# Patient Record
Sex: Male | Born: 1945
Health system: Southern US, Community
[De-identification: ages and names within clinical notes are randomized; demographics above are authoritative.]

## PROBLEM LIST (undated history)

## (undated) DIAGNOSIS — E785 Hyperlipidemia, unspecified: Secondary | ICD-10-CM

## (undated) DIAGNOSIS — I2510 Atherosclerotic heart disease of native coronary artery without angina pectoris: Secondary | ICD-10-CM

## (undated) DIAGNOSIS — K649 Unspecified hemorrhoids: Secondary | ICD-10-CM

## (undated) DIAGNOSIS — I209 Angina pectoris, unspecified: Secondary | ICD-10-CM

## (undated) DIAGNOSIS — F419 Anxiety disorder, unspecified: Secondary | ICD-10-CM

## (undated) DIAGNOSIS — Z9289 Personal history of other medical treatment: Secondary | ICD-10-CM

## (undated) DIAGNOSIS — R112 Nausea with vomiting, unspecified: Secondary | ICD-10-CM

## (undated) DIAGNOSIS — R918 Other nonspecific abnormal finding of lung field: Secondary | ICD-10-CM

## (undated) DIAGNOSIS — Z800 Family history of malignant neoplasm of digestive organs: Secondary | ICD-10-CM

## (undated) DIAGNOSIS — K219 Gastro-esophageal reflux disease without esophagitis: Secondary | ICD-10-CM

## (undated) DIAGNOSIS — I10 Essential (primary) hypertension: Secondary | ICD-10-CM

## (undated) DIAGNOSIS — I351 Nonrheumatic aortic (valve) insufficiency: Secondary | ICD-10-CM

## (undated) DIAGNOSIS — C3490 Malignant neoplasm of unspecified part of unspecified bronchus or lung: Secondary | ICD-10-CM

## (undated) DIAGNOSIS — C73 Malignant neoplasm of thyroid gland: Secondary | ICD-10-CM

## (undated) DIAGNOSIS — Z8489 Family history of other specified conditions: Secondary | ICD-10-CM

## (undated) DIAGNOSIS — M1990 Unspecified osteoarthritis, unspecified site: Secondary | ICD-10-CM

## (undated) DIAGNOSIS — R5383 Other fatigue: Secondary | ICD-10-CM

## (undated) DIAGNOSIS — K635 Polyp of colon: Secondary | ICD-10-CM

## (undated) DIAGNOSIS — Z9889 Other specified postprocedural states: Secondary | ICD-10-CM

## (undated) DIAGNOSIS — R0600 Dyspnea, unspecified: Secondary | ICD-10-CM

## (undated) DIAGNOSIS — R7303 Prediabetes: Secondary | ICD-10-CM

## (undated) HISTORY — DX: Atherosclerotic heart disease of native coronary artery without angina pectoris: I25.10

## (undated) HISTORY — DX: Dyspnea, unspecified: R06.00

## (undated) HISTORY — PX: PILONIDAL CYST EXCISION: SHX744

## (undated) HISTORY — DX: Polyp of colon: K63.5

## (undated) HISTORY — DX: Personal history of other medical treatment: Z92.89

## (undated) HISTORY — DX: Unspecified hemorrhoids: K64.9

## (undated) HISTORY — DX: Other fatigue: R53.83

## (undated) HISTORY — DX: Nonrheumatic aortic (valve) insufficiency: I35.1

## (undated) HISTORY — DX: Family history of malignant neoplasm of digestive organs: Z80.0

## (undated) HISTORY — PX: CAROTID ENDARTERECTOMY: SUR193

## (undated) HISTORY — DX: Malignant neoplasm of thyroid gland: C73

## (undated) HISTORY — PX: REFRACTIVE SURGERY: SHX103

## (undated) HISTORY — PX: ESOPHAGOGASTRODUODENOSCOPY: SHX1529

## (undated) HISTORY — DX: Essential (primary) hypertension: I10

## (undated) HISTORY — DX: Malignant neoplasm of unspecified part of unspecified bronchus or lung: C34.90

## (undated) HISTORY — DX: Gastro-esophageal reflux disease without esophagitis: K21.9

## (undated) HISTORY — PX: BACK SURGERY: SHX140

## (undated) HISTORY — PX: OTHER SURGICAL HISTORY: SHX169

## (undated) HISTORY — PX: CARDIAC CATHETERIZATION: SHX172

---

## 2003-11-24 ENCOUNTER — Encounter: Admission: RE | Admit: 2003-11-24 | Discharge: 2003-11-24 | Payer: Self-pay | Admitting: Family Medicine

## 2004-05-23 ENCOUNTER — Ambulatory Visit: Payer: Self-pay | Admitting: Gastroenterology

## 2004-07-25 ENCOUNTER — Ambulatory Visit: Payer: Self-pay | Admitting: Gastroenterology

## 2006-01-08 HISTORY — PX: COLONOSCOPY WITH ESOPHAGOGASTRODUODENOSCOPY (EGD): SHX5779

## 2010-07-07 ENCOUNTER — Other Ambulatory Visit: Payer: Self-pay | Admitting: Dermatology

## 2010-12-14 ENCOUNTER — Other Ambulatory Visit: Payer: Self-pay | Admitting: Dermatology

## 2011-01-25 ENCOUNTER — Other Ambulatory Visit: Payer: Self-pay | Admitting: Dermatology

## 2011-06-14 ENCOUNTER — Other Ambulatory Visit: Payer: Self-pay | Admitting: Dermatology

## 2011-08-29 ENCOUNTER — Other Ambulatory Visit: Payer: Self-pay | Admitting: Dermatology

## 2011-12-13 ENCOUNTER — Other Ambulatory Visit: Payer: Self-pay | Admitting: Dermatology

## 2012-07-10 ENCOUNTER — Other Ambulatory Visit: Payer: Self-pay | Admitting: Dermatology

## 2014-01-28 ENCOUNTER — Other Ambulatory Visit: Payer: Self-pay | Admitting: Dermatology

## 2014-12-08 ENCOUNTER — Other Ambulatory Visit: Payer: Self-pay | Admitting: Gastroenterology

## 2016-01-20 DIAGNOSIS — R232 Flushing: Secondary | ICD-10-CM | POA: Diagnosis not present

## 2016-01-20 DIAGNOSIS — M79661 Pain in right lower leg: Secondary | ICD-10-CM | POA: Diagnosis not present

## 2016-01-20 DIAGNOSIS — M79662 Pain in left lower leg: Secondary | ICD-10-CM | POA: Diagnosis not present

## 2016-01-20 DIAGNOSIS — I10 Essential (primary) hypertension: Secondary | ICD-10-CM | POA: Diagnosis not present

## 2016-01-30 DIAGNOSIS — M2041 Other hammer toe(s) (acquired), right foot: Secondary | ICD-10-CM | POA: Diagnosis not present

## 2016-01-30 DIAGNOSIS — M19071 Primary osteoarthritis, right ankle and foot: Secondary | ICD-10-CM | POA: Diagnosis not present

## 2016-02-03 ENCOUNTER — Encounter: Payer: Self-pay | Admitting: *Deleted

## 2016-02-06 ENCOUNTER — Encounter (INDEPENDENT_AMBULATORY_CARE_PROVIDER_SITE_OTHER): Payer: Self-pay

## 2016-02-06 ENCOUNTER — Ambulatory Visit (INDEPENDENT_AMBULATORY_CARE_PROVIDER_SITE_OTHER): Payer: Medicare Other | Admitting: Cardiology

## 2016-02-06 ENCOUNTER — Telehealth (HOSPITAL_COMMUNITY): Payer: Self-pay | Admitting: *Deleted

## 2016-02-06 ENCOUNTER — Encounter: Payer: Self-pay | Admitting: Cardiology

## 2016-02-06 VITALS — BP 124/72 | HR 70 | Ht 71.0 in | Wt 188.2 lb

## 2016-02-06 DIAGNOSIS — R0602 Shortness of breath: Secondary | ICD-10-CM

## 2016-02-06 DIAGNOSIS — Z01810 Encounter for preprocedural cardiovascular examination: Secondary | ICD-10-CM

## 2016-02-06 DIAGNOSIS — I10 Essential (primary) hypertension: Secondary | ICD-10-CM

## 2016-02-06 NOTE — Telephone Encounter (Signed)
Patient given detailed instructions per Myocardial Perfusion Study Information Sheet for the test on 02/08/16 at 0945. Patient notified to arrive 15 minutes early and that it is imperative to arrive on time for appointment to keep from having the test rescheduled.  If you need to cancel or reschedule your appointment, please call the office within 24 hours of your appointment. Failure to do so may result in a cancellation of your appointment, and a $50 no show fee. Patient verbalized understanding.Jaquelin Meaney, Ranae [place]ms

## 2016-02-06 NOTE — Patient Instructions (Signed)
Medication Instructions:  The current medical regimen is effective;  continue present plan and medications.  Testing/Procedures: Your physician has requested that you have an echocardiogram. Echocardiography is a painless test that uses sound waves to create images of your heart. It provides your doctor with information about the size and shape of your heart and how well your heart's chambers and valves are working. This procedure takes approximately one hour. There are no restrictions for this procedure.  Your physician has requested that you have a myoview.  Please follow instruction sheet, as given.  Follow-Up: Follow up as needed after the above testing  Thank you for choosing [facility]!!

## 2016-02-06 NOTE — Progress Notes (Signed)
Cardiology Office Note    Date:  02/06/2016   ID:  Miguel Wolfe, DOB 25-Oct-1945, MRN [id number]  PCP:  Gennette Pac, MD  Cardiologist:   Candee Furbish, MD     History of Present Illness:  Miguel Wolfe is a 71 y.o. male here for evaluation of dyspnea on exertion at the request of Dr. [name]. Been experiencing several months of fatigue, shortness of breath, energy decrease especially with yard work. Legs hurt bilaterally as well, constant, moderate, calf. One hour of yard work challenging. Interestingly however he is able to walk normally throughout the neighborhood without any symptoms. Flushing. He is not taking any niacin supplementation. ED as well. Has a toe surgery planned soon, next Tuesday, Dr. Doran Durand.  His father had coronary artery disease and he also carries the diagnosis of hypertension as well as hyperlipidemia. He is not a smoker. Weight has been under control.  He was also complaining of some myalgias and pravastatin was stopped, did not seem to help. Restarted and may have felt worse. Now off. CK was checked and normal. He did smoke but quit cigarettes in 1982. His father at age 74 years old had an myocardial infarction and subsequently has had bypass and stenting still alive. Mother valve issue.   He has had elevated glucose in the past and has been treated with Actos formally.  Hemoglobin 16.3 platelets 163, glucose 107, creatinine 0.81, total cholesterol 200, triglycerides 207, LDL 116, HDL 42, TSH 1.5  EKG personally viewed from 12/29/15 shows sinus rhythm heart rate 63 with no other abnormalities.  Denies syncope, bleeding, orthopnea, PND. Right toe pain. He will need a pin placed. May be able to be done under local.  He used to work in the past for [employer] in [place].   Past Medical History:  Diagnosis Date  . Colon polyps   . Dyslipidemia   . Dyspnea   . Fatigue   . GERD (gastroesophageal reflux disease)   .  Hemorrhoids   . Hypertension   . Leg pain   . Myalgia     Past Surgical History:  Procedure Laterality Date  . COLONOSCOPY WITH ESOPHAGOGASTRODUODENOSCOPY (EGD)  2008  . PILONIDAL CYCT    . REFRACTIVE SURGERY      Current Medications: Outpatient Medications Prior to Visit  Medication Sig Dispense Refill  . AMLODIPINE BESYLATE PO Take 10 mg by mouth daily.    Marland Kitchen aspirin EC 81 MG tablet Take 81 mg by mouth daily.    Marland Kitchen omega-3 acid ethyl esters (LOVAZA) 1 g capsule Take 2 g by mouth 2 (two) times daily.    . pravastatin (PRAVACHOL) 40 MG tablet Take 40 mg by mouth daily.    . ranitidine (ZANTAC) 75 MG tablet Take 75 mg by mouth 2 (two) times daily.    Marland Kitchen telmisartan (MICARDIS) 80 MG tablet Take 80 mg by mouth daily.    Marland Kitchen zolpidem (AMBIEN) 10 MG tablet Take 10 mg by mouth at bedtime as needed for sleep.    . propranolol (INDERAL) 10 MG tablet Take 10 mg by mouth 3 (three) times daily.     No facility-administered medications prior to visit.      Allergies:   Gemfibrozil; Niaspan [niacin er]; and Simvastatin   Social History   Social History  . Marital status: Married    Spouse name: N/A  . Number of children: N/A  . Years of education: N/A   Social History Main Topics  .  Smoking status: Former Smoker    Types: Cigarettes    Quit date: 02/01/1967  . Smokeless tobacco: Never Used  . Alcohol use Yes  . Drug use: No  . Sexual activity: Not Asked   Other Topics Concern  . None   Social History Narrative  . None     Family History:  The patient's family history includes Colon cancer in his paternal uncle; Crohn's disease in his sister; Healthy in his sister; Heart attack (age of onset: 73) in his father.   ROS:   Please see the history of present illness.    ROS All other systems reviewed and are negative.   PHYSICAL EXAM:   VS:  BP 124/72 (BP Location: Left Arm)   Pulse 70   Ht '5\' 11"'$  (1.803 m)   Wt 188 lb 3.2 oz (85.4 kg)   BMI 26.25 kg/m    GEN: Well  nourished, well developed, in no acute distress  HEENT: normal  Neck: no JVD, carotid bruits, or masses Cardiac: RRR; no murmurs, rubs, or gallops,no edema  Respiratory:  clear to auscultation bilaterally, normal work of breathing GI: soft, nontender, nondistended, + BS MS: no deformity or atrophy , 2+ dorsalis pedis pulses bilaterally, bounding Skin: warm and dry, no rash Neuro:  Alert and Oriented x 3, Strength and sensation are intact Psych: euthymic mood, full affect  Wt Readings from Last 3 Encounters:  02/06/16 188 lb 3.2 oz (85.4 kg)      Studies/Labs Reviewed:   EKG:  Prior EKG from December reviewed as above, normal sinus rhythm with no other abnormalities. EKG ordered today on 02/06/16 shows sinus rhythm 70 with possible left ventricular hypertrophy. No ST segment changes.  Recent Labs: No results found for requested labs within last 8760 hours.   Lipid Panel No results found for: CHOL, TRIG, HDL, CHOLHDL, VLDL, LDLCALC, LDLDIRECT  Additional studies/ records that were reviewed today include:  Former office notes, lab work, EKG reviewed    ASSESSMENT:    1. Shortness of breath   2. Essential hypertension   3. Encounter for pre-operative cardiovascular clearance      PLAN:  In order of problems listed above:  Dyspnea on exertion  - With his father's family history of myocardial infarction and coronary artery disease, his hyperlipidemia, hypertension, remote former smoker and previous prediabetes, I think it would be reasonable to proceed with nuclear stress test to ensure that his dyspnea on exertion is not an anginal equivalent. Occasional periodic flushing, not associated with any new supplements. He has not had any previous history of being hyper vagal, no prior syncopal episodes.  - I will also check an echocardiogram to ensure proper structure and function of his heart.  Essential hypertension  - Medications reviewed. Well-controlled per Dr.  Rex Kras.  Hyperlipidemia  - Has had some challenges With possible myalgias in the past with pravastatin. This was stopped previously by [name]. He restarted it and thinks that he may felt a little bit worse. He is now going to continue with his statin holiday. CPK was normal.  Leg pain  - Thankfully, his distal pedal pulses are bounding, excellent. No evidence of peripheral vascular disease.  Preoperative cardiac risk stratification  - Given his dyspnea on exertion while doing yardwork for instance, I think would be wise for him to proceed with nuclear stress test prior to surgery. This will be done hopefully before his surgical procedure on next Tuesday. If for some reason stress testing needs to  be delayed, he may wish to delay his surgery albeit fairly low risk surgery.    Medication Adjustments/Labs and Tests Ordered: Current medicines are reviewed at length with the patient today.  Concerns regarding medicines are outlined above.  Medication changes, Labs and Tests ordered today are listed in the Patient Instructions below. Patient Instructions  Medication Instructions:  The current medical regimen is effective;  continue present plan and medications.  Testing/Procedures: Your physician has requested that you have an echocardiogram. Echocardiography is a painless test that uses sound waves to create images of your heart. It provides your doctor with information about the size and shape of your heart and how well your heart's chambers and valves are working. This procedure takes approximately one hour. There are no restrictions for this procedure.  Your physician has requested that you have a myoview.  Please follow instruction sheet, as given.  Follow-Up: Follow up as needed after the above testing  Thank you for choosing [facility]!!        Signed, Candee Furbish, MD  02/06/2016 11:02 AM    [facility] [address], [place], [place]   [address] Phone: [phone]; Fax: [phone]

## 2016-02-08 ENCOUNTER — Ambulatory Visit (HOSPITAL_COMMUNITY): Payer: Medicare Other | Attending: Cardiovascular Disease

## 2016-02-08 ENCOUNTER — Ambulatory Visit (HOSPITAL_BASED_OUTPATIENT_CLINIC_OR_DEPARTMENT_OTHER): Payer: Medicare Other

## 2016-02-08 ENCOUNTER — Telehealth: Payer: Self-pay | Admitting: Cardiology

## 2016-02-08 ENCOUNTER — Other Ambulatory Visit: Payer: Self-pay

## 2016-02-08 DIAGNOSIS — R0609 Other forms of dyspnea: Secondary | ICD-10-CM | POA: Diagnosis not present

## 2016-02-08 DIAGNOSIS — I10 Essential (primary) hypertension: Secondary | ICD-10-CM

## 2016-02-08 DIAGNOSIS — R9439 Abnormal result of other cardiovascular function study: Secondary | ICD-10-CM | POA: Insufficient documentation

## 2016-02-08 DIAGNOSIS — I429 Cardiomyopathy, unspecified: Secondary | ICD-10-CM | POA: Diagnosis present

## 2016-02-08 DIAGNOSIS — Z87891 Personal history of nicotine dependence: Secondary | ICD-10-CM | POA: Insufficient documentation

## 2016-02-08 DIAGNOSIS — E785 Hyperlipidemia, unspecified: Secondary | ICD-10-CM | POA: Diagnosis not present

## 2016-02-08 DIAGNOSIS — Z8249 Family history of ischemic heart disease and other diseases of the circulatory system: Secondary | ICD-10-CM | POA: Insufficient documentation

## 2016-02-08 DIAGNOSIS — I119 Hypertensive heart disease without heart failure: Secondary | ICD-10-CM | POA: Diagnosis not present

## 2016-02-08 DIAGNOSIS — Z01810 Encounter for preprocedural cardiovascular examination: Secondary | ICD-10-CM

## 2016-02-08 DIAGNOSIS — R0602 Shortness of breath: Secondary | ICD-10-CM | POA: Diagnosis not present

## 2016-02-08 DIAGNOSIS — R5383 Other fatigue: Secondary | ICD-10-CM | POA: Diagnosis not present

## 2016-02-08 DIAGNOSIS — R0600 Dyspnea, unspecified: Secondary | ICD-10-CM | POA: Diagnosis present

## 2016-02-08 LAB — MYOCARDIAL PERFUSION IMAGING
CHL CUP MPHR: 152 {beats}/min
CHL CUP NUCLEAR SDS: 3
CHL RATE OF PERCEIVED EXERTION: 18
CSEPEDS: 0 s
CSEPHR: 100 %
CSEPPHR: 153 {beats}/min
Estimated workload: 8.5 METS
Exercise duration (min): 7 min
LV sys vol: 60 mL
LVDIAVOL: 123 mL (ref 62–150)
RATE: 0.25
Rest HR: 105 {beats}/min
SRS: 5
SSS: 8
TID: 1.3

## 2016-02-08 LAB — ECHOCARDIOGRAM COMPLETE
Height: 71 in
Weight: 3008 oz

## 2016-02-08 MED ORDER — TECHNETIUM TC 99M TETROFOSMIN IV KIT
10.5000 | PACK | Freq: Once | INTRAVENOUS | Status: AC | PRN
  Administered 2016-02-08: 10.5 via INTRAVENOUS
  Filled 2016-02-08: qty 11

## 2016-02-08 MED ORDER — TECHNETIUM TC 99M TETROFOSMIN IV KIT
32.8000 | PACK | Freq: Once | INTRAVENOUS | Status: AC | PRN
Start: 2016-02-08 — End: 2016-02-08
  Administered 2016-02-08: 32.8 via INTRAVENOUS
  Filled 2016-02-08: qty 33

## 2016-02-08 NOTE — Telephone Encounter (Signed)
New Message   Pt received results on my chart and is having trouble understanding. Requesting call back from nurse to help clarify.

## 2016-02-08 NOTE — Telephone Encounter (Signed)
Reviewed results of both echo and nuc study with patient.  He is aware of results and that he needs to come into office to discuss possible need for cardiac cath.  He would like to know from Dr Marlou Porch 2/1 if he can have surgery as scheduled on Tuesday next week.  He is aware someone will call him back tomorrow.

## 2016-02-09 NOTE — Telephone Encounter (Signed)
My suggestion is to hold on surgery. Candee Furbish, MD

## 2016-02-09 NOTE — Telephone Encounter (Signed)
Pt is aware to hold on surgery. Pt is aware to keep appointment with Dr. Marlou Porch on 02/15/16 at 11:15 AM. Pt verbalized understanding.

## 2016-02-15 ENCOUNTER — Ambulatory Visit (INDEPENDENT_AMBULATORY_CARE_PROVIDER_SITE_OTHER): Payer: Medicare Other | Admitting: Cardiology

## 2016-02-15 ENCOUNTER — Encounter: Payer: Self-pay | Admitting: Cardiology

## 2016-02-15 ENCOUNTER — Encounter: Payer: Self-pay | Admitting: *Deleted

## 2016-02-15 VITALS — BP 122/70 | HR 68 | Ht 71.0 in | Wt 188.0 lb

## 2016-02-15 DIAGNOSIS — R0602 Shortness of breath: Secondary | ICD-10-CM | POA: Diagnosis not present

## 2016-02-15 DIAGNOSIS — I10 Essential (primary) hypertension: Secondary | ICD-10-CM | POA: Diagnosis not present

## 2016-02-15 DIAGNOSIS — R9439 Abnormal result of other cardiovascular function study: Secondary | ICD-10-CM

## 2016-02-15 NOTE — Patient Instructions (Signed)
Medication Instructions:  Your physician recommends that you continue on your current medications as directed. Please refer to the Current Medication list given to you today.   Labwork: Lab work to be done today-BMP, CBC, PT  Testing/Procedures: Your physician has requested that you have a cardiac catheterization. Cardiac catheterization is used to diagnose and/or treat various heart conditions. Doctors may recommend this procedure for a number of different reasons. The most common reason is to evaluate chest pain. Chest pain can be a symptom of coronary artery disease (CAD), and cardiac catheterization can show whether plaque is narrowing or blocking your heart's arteries. This procedure is also used to evaluate the valves, as well as measure the blood flow and oxygen levels in different parts of your heart. For further information please visit [email or web address]. Please follow instruction sheet, as given. Scheduled for Feb.12, 2018  Follow-Up: To be arranged based on results of procedure.   Any Other Special Instructions Will Be Listed Below (If Applicable).     If you need a refill on your cardiac medications before your next appointment, please call your pharmacy.

## 2016-02-15 NOTE — Progress Notes (Signed)
Cardiology Office Note    Date:  02/15/2016   ID:  Miguel Wolfe, DOB January 02, 1946, MRN [id number]  PCP:  Gennette Pac, MD  Cardiologist:   Candee Furbish, MD     History of Present Illness:  Miguel Wolfe is a 71 y.o. male here for follow-up of abnormal nuclear stress test showing inferolateral MI with peri-infarct ischemia, EF 51% on nuclear, 55% on echocardiogram. This was done secondary to dyspnea on exertion and performing yard work for instance. Been experiencing several months of fatigue, shortness of breath, energy decrease especially with yard work. Legs hurt bilaterally as well, constant, moderate, calf. One hour of yard work challenging. Interestingly however he is able to walk normally throughout the neighborhood without any symptoms. Flushing. He is not taking any niacin supplementation. ED as well. Has a toe surgery planned soon,  Dr. Doran Durand.  His father had coronary artery disease and he also carries the diagnosis of hypertension as well as hyperlipidemia. He is not a smoker. Weight has been under control.  He was also complaining of some myalgias and pravastatin was stopped, did not seem to help. Restarted and may have felt worse. Now off. Now back on and feels no different. Excellent bounding pulses distally. CK was checked and normal. He did smoke but quit cigarettes in 1982. His father at age 72 years old had an myocardial infarction and subsequently has had bypass and stenting still alive. Mother valve issue.   He has had elevated glucose in the past and has been treated with Actos formally.  Hemoglobin 16.3 platelets 163, glucose 107, creatinine 0.81, total cholesterol 200, triglycerides 207, LDL 116, HDL 42, TSH 1.5  EKG personally viewed from 12/29/15 shows sinus rhythm heart rate 63 with no other abnormalities.  Denies syncope, bleeding, orthopnea, PND. Right toe pain. He will need a pin placed. May be able to be done under local.  He used to work in the past for  [employer] in [place].   Past Medical History:  Diagnosis Date  . Colon polyps   . Dyslipidemia   . Dyspnea   . Fatigue   . GERD (gastroesophageal reflux disease)   . Hemorrhoids   . Hypertension   . Leg pain   . Myalgia     Past Surgical History:  Procedure Laterality Date  . COLONOSCOPY WITH ESOPHAGOGASTRODUODENOSCOPY (EGD)  2008  . PILONIDAL CYCT    . REFRACTIVE SURGERY      Current Medications: Outpatient Medications Prior to Visit  Medication Sig Dispense Refill  . AMLODIPINE BESYLATE PO Take 10 mg by mouth daily.    Marland Kitchen aspirin EC 81 MG tablet Take 81 mg by mouth daily.    Marland Kitchen omega-3 acid ethyl esters (LOVAZA) 1 g capsule Take 2 g by mouth 2 (two) times daily.    . pravastatin (PRAVACHOL) 40 MG tablet Take 40 mg by mouth daily.    . propranolol (INDERAL) 20 MG tablet Take 20 mg by mouth daily.  2  . ranitidine (ZANTAC) 75 MG tablet Take 75 mg by mouth 2 (two) times daily.    Marland Kitchen telmisartan (MICARDIS) 80 MG tablet Take 80 mg by mouth daily.    Marland Kitchen zolpidem (AMBIEN) 10 MG tablet Take 10 mg by mouth at bedtime as needed for sleep.     No facility-administered medications prior to visit.      Allergies:   Gemfibrozil; Niaspan [niacin er]; and Simvastatin   Social History   Social History  .  Marital status: Married    Spouse name: N/A  . Number of children: N/A  . Years of education: N/A   Social History Main Topics  . Smoking status: Former Smoker    Types: Cigarettes    Quit date: 02/01/1967  . Smokeless tobacco: Never Used  . Alcohol use Yes  . Drug use: No  . Sexual activity: Not Asked   Other Topics Concern  . None   Social History Narrative  . None     Family History:  The patient's family history includes Colon cancer in his paternal uncle; Crohn's disease in his sister; Healthy in his sister; Heart attack (age of onset: 17) in his father.   ROS:   Please see the history of present illness.    ROS All other systems  reviewed and are negative.   PHYSICAL EXAM:   VS:  BP 122/70   Pulse 68   Ht '5\' 11"'$  (1.803 m)   Wt 188 lb (85.3 kg)   BMI 26.22 kg/m    GEN: Well nourished, well developed, in no acute distress  HEENT: normal  Neck: no JVD, carotid bruits, or masses Cardiac: RRR; no murmurs, rubs, or gallops,no edema  Respiratory:  clear to auscultation bilaterally, normal work of breathing GI: soft, nontender, nondistended, + BS MS: no deformity or atrophy , 2+ dorsalis pedis pulses bilaterally, bounding Skin: warm and dry, no rash Neuro:  Alert and Oriented x 3, Strength and sensation are intact Psych: euthymic mood, full affect  Wt Readings from Last 3 Encounters:  02/15/16 188 lb (85.3 kg)  02/08/16 188 lb (85.3 kg)  02/06/16 188 lb 3.2 oz (85.4 kg)      Studies/Labs Reviewed:   EKG:  Prior EKG from December reviewed as above, normal sinus rhythm with no other abnormalities. EKG ordered today on 02/06/16 shows sinus rhythm 70 with possible left ventricular hypertrophy. No ST segment changes.  Recent Labs: No results found for requested labs within last 8760 hours.   Lipid Panel No results found for: CHOL, TRIG, HDL, CHOLHDL, VLDL, LDLCALC, LDLDIRECT  Additional studies/ records that were reviewed today include:  Former office notes, lab work, EKG reviewed  [place] 02/08/16  The patient walked for 7:00 of a standard Bruce protocol GXT . Peak HR of 153 which is 100% predicted maximal HR .  There were no ST or T wave changes to suggest ischemia. BP response to exercise was normal .  Defect 1: There is a medium defect of moderate severity present in the basal inferolateral, mid inferolateral and apical lateral location. This is consistent with a prior inferolateral MI with peri-infarct ischemia.  The left ventricular ejection fraction is mildly decreased (45-54%). Nuclear stress EF: 51%.  This is an intermediate risk study.   ECHO 02/08/16  - Left ventricle: The cavity size  was normal. Wall thickness was   increased in a pattern of mild LVH. Systolic function was normal.   The estimated ejection fraction was in the range of 55% to 60%.   Wall motion was normal; there were no regional wall motion   abnormalities. Doppler parameters are consistent with abnormal   left ventricular relaxation (grade 1 diastolic dysfunction). - Aortic valve: There was trivial regurgitation. - Left atrium: The atrium was mildly dilated.  ASSESSMENT:    1. Abnormal nuclear stress test   2. Shortness of breath   3. Essential hypertension      PLAN:  In order of problems listed above:  Dyspnea on  exertion  - Nuclear stress test was abnormal showing intermediate risk and possible inferolateral old MI possible ischemia as well  - I would like for him to proceed with cardiac catheterization, diagnostic given his symptoms which may be anginal.  - Note his father has history of MI/CAD.  - Risks of cardiac catheterization reviewed including stroke, heart attack, death, renal impairment, bleeding.  - Echo EF was reassuring with no wall motion abnormalities.  -  Occasional periodic flushing, not associated with any new supplements. He has not had any previous history of being hyper vagal, no prior syncopal episodes.  Essential hypertension  - Medications reviewed. Well-controlled per [name].  Hyperlipidemia  - Has had some challenges With possible myalgias in the past with pravastatin. This was stopped previously by [name]. He restarted it and thinksThat he feels no different. He is now going to continue with his statin. CPK was normal.  Leg pain  - Thankfully, his distal pedal pulses are bounding, excellent. No evidence of peripheral vascular disease. Unsure of cause of pain.  Preoperative cardiac risk stratification  - Given his dyspnea on exertion while doing yardwork for instance, and intermediate risk nuclear stress test I think would be wise for him to proceed with  heart catheterization prior to surgery. He does have an upcoming toe surgery, Dr. Doran Durand. This should be a relatively low risk procedure however I do make sure that he does not have any significant underlying CAD that needs to be addressed. We discussed the risks of heart catheterization 1:1000 stroke, heart attack, death, renal impairment, bleeding. His wife was present for discussion. Willing to proceed.   Medication Adjustments/Labs and Tests Ordered: Current medicines are reviewed at length with the patient today.  Concerns regarding medicines are outlined above.  Medication changes, Labs and Tests ordered today are listed in the Patient Instructions below. There are no Patient Instructions on file for this visit.   Signed, Candee Furbish, MD  02/15/2016 11:54 AM    [facility] [address], [place], [place]  [address] Phone: [phone]; Fax: [phone]

## 2016-02-16 DIAGNOSIS — I10 Essential (primary) hypertension: Secondary | ICD-10-CM | POA: Insufficient documentation

## 2016-02-16 DIAGNOSIS — R9439 Abnormal result of other cardiovascular function study: Secondary | ICD-10-CM | POA: Insufficient documentation

## 2016-02-16 LAB — CBC WITH DIFFERENTIAL/PLATELET
BASOS: 0 %
Basophils Absolute: 0 10*3/uL (ref 0.0–0.2)
EOS (ABSOLUTE): 0.3 10*3/uL (ref 0.0–0.4)
EOS: 5 %
HEMATOCRIT: 48 % (ref 37.5–51.0)
HEMOGLOBIN: 16.3 g/dL (ref 13.0–17.7)
Immature Grans (Abs): 0 10*3/uL (ref 0.0–0.1)
Immature Granulocytes: 0 %
LYMPHS ABS: 2 10*3/uL (ref 0.7–3.1)
Lymphs: 36 %
MCH: 30.4 pg (ref 26.6–33.0)
MCHC: 34 g/dL (ref 31.5–35.7)
MCV: 90 fL (ref 79–97)
MONOCYTES: 9 %
MONOS ABS: 0.5 10*3/uL (ref 0.1–0.9)
NEUTROS ABS: 2.8 10*3/uL (ref 1.4–7.0)
Neutrophils: 50 %
Platelets: 187 10*3/uL (ref 150–379)
RBC: 5.36 x10E6/uL (ref 4.14–5.80)
RDW: 13.7 % (ref 12.3–15.4)
WBC: 5.5 10*3/uL (ref 3.4–10.8)

## 2016-02-16 LAB — BASIC METABOLIC PANEL
BUN / CREAT RATIO: 19 (ref 10–24)
BUN: 15 mg/dL (ref 8–27)
CHLORIDE: 98 mmol/L (ref 96–106)
CO2: 26 mmol/L (ref 18–29)
Calcium: 9.7 mg/dL (ref 8.6–10.2)
Creatinine, Ser: 0.81 mg/dL (ref 0.76–1.27)
GFR calc Af Amer: 106 mL/min/{1.73_m2} (ref >59)
GFR calc non Af Amer: 91 mL/min/{1.73_m2} (ref >59)
GLUCOSE: 108 mg/dL — AB (ref 65–99)
Potassium: 4 mmol/L (ref 3.5–5.2)
SODIUM: 143 mmol/L (ref 134–144)

## 2016-02-16 LAB — PROTIME-INR
INR: 1 (ref 0.8–1.2)
PROTHROMBIN TIME: 10.9 s (ref 9.1–12.0)

## 2016-02-20 ENCOUNTER — Encounter (HOSPITAL_COMMUNITY): Payer: Self-pay | Admitting: Cardiovascular Disease

## 2016-02-20 ENCOUNTER — Ambulatory Visit (HOSPITAL_COMMUNITY)
Admission: RE | Admit: 2016-02-20 | Discharge: 2016-02-20 | Disposition: A | Discharge status: Home / Self Care | Payer: Medicare Other | Source: Ambulatory Visit | Attending: Cardiovascular Disease | Admitting: Cardiovascular Disease

## 2016-02-20 ENCOUNTER — Other Ambulatory Visit: Payer: Self-pay

## 2016-02-20 ENCOUNTER — Encounter (HOSPITAL_COMMUNITY)
Admission: RE | Disposition: A | Discharge status: Home / Self Care | Payer: Self-pay | Source: Ambulatory Visit | Attending: Cardiovascular Disease

## 2016-02-20 DIAGNOSIS — E785 Hyperlipidemia, unspecified: Secondary | ICD-10-CM | POA: Diagnosis not present

## 2016-02-20 DIAGNOSIS — M791 Myalgia: Secondary | ICD-10-CM | POA: Diagnosis not present

## 2016-02-20 DIAGNOSIS — M79606 Pain in leg, unspecified: Secondary | ICD-10-CM | POA: Diagnosis not present

## 2016-02-20 DIAGNOSIS — I252 Old myocardial infarction: Secondary | ICD-10-CM | POA: Insufficient documentation

## 2016-02-20 DIAGNOSIS — I25110 Atherosclerotic heart disease of native coronary artery with unstable angina pectoris: Secondary | ICD-10-CM | POA: Insufficient documentation

## 2016-02-20 DIAGNOSIS — I10 Essential (primary) hypertension: Secondary | ICD-10-CM | POA: Diagnosis not present

## 2016-02-20 DIAGNOSIS — K219 Gastro-esophageal reflux disease without esophagitis: Secondary | ICD-10-CM | POA: Diagnosis not present

## 2016-02-20 DIAGNOSIS — Z87891 Personal history of nicotine dependence: Secondary | ICD-10-CM | POA: Diagnosis not present

## 2016-02-20 DIAGNOSIS — Z7982 Long term (current) use of aspirin: Secondary | ICD-10-CM | POA: Insufficient documentation

## 2016-02-20 DIAGNOSIS — I200 Unstable angina: Secondary | ICD-10-CM

## 2016-02-20 DIAGNOSIS — Z955 Presence of coronary angioplasty implant and graft: Secondary | ICD-10-CM

## 2016-02-20 DIAGNOSIS — Z8249 Family history of ischemic heart disease and other diseases of the circulatory system: Secondary | ICD-10-CM | POA: Insufficient documentation

## 2016-02-20 HISTORY — PX: CORONARY STENT INTERVENTION: CATH118234

## 2016-02-20 HISTORY — PX: LEFT HEART CATH AND CORONARY ANGIOGRAPHY: CATH118249

## 2016-02-20 LAB — POCT ACTIVATED CLOTTING TIME
ACTIVATED CLOTTING TIME: 246 s
ACTIVATED CLOTTING TIME: 368 s

## 2016-02-20 SURGERY — LEFT HEART CATH AND CORONARY ANGIOGRAPHY
Anesthesia: LOCAL

## 2016-02-20 MED ORDER — SODIUM CHLORIDE 0.9% FLUSH: 3.0000 mL | INTRAVENOUS | Status: DC | PRN

## 2016-02-20 MED ORDER — PRAVASTATIN SODIUM 40 MG PO TABS: 40.0000 mg | ORAL_TABLET | Freq: Every evening | ORAL | Status: DC

## 2016-02-20 MED ORDER — CLOPIDOGREL BISULFATE 75 MG PO TABS: 75.0000 mg | ORAL_TABLET | Freq: Every day | ORAL | Status: DC

## 2016-02-20 MED ORDER — FENTANYL CITRATE (PF) 100 MCG/2ML IJ SOLN
INTRAMUSCULAR | Status: DC | PRN
  Administered 2016-02-20: 50 ug via INTRAVENOUS
  Administered 2016-02-20: 25 ug via INTRAVENOUS

## 2016-02-20 MED ORDER — IOPAMIDOL (ISOVUE-370) INJECTION 76%
INTRAVENOUS | Status: AC
  Filled 2016-02-20: qty 50

## 2016-02-20 MED ORDER — SODIUM CHLORIDE 0.9 % IV SOLN: INTRAVENOUS | Status: AC

## 2016-02-20 MED ORDER — CLOPIDOGREL BISULFATE 75 MG PO TABS: 75.0000 mg | ORAL_TABLET | Freq: Every day | ORAL | 11 refills | Status: DC

## 2016-02-20 MED ORDER — MIDAZOLAM HCL 2 MG/2ML IJ SOLN
INTRAMUSCULAR | Status: DC | PRN
  Administered 2016-02-20: 2 mg via INTRAVENOUS
  Administered 2016-02-20: 1 mg via INTRAVENOUS

## 2016-02-20 MED ORDER — PANTOPRAZOLE SODIUM 40 MG PO TBEC
40.0000 mg | DELAYED_RELEASE_TABLET | Freq: Every day | ORAL | Status: DC
  Filled 2016-02-20 (×2): qty 1

## 2016-02-20 MED ORDER — MIDAZOLAM HCL 2 MG/2ML IJ SOLN
INTRAMUSCULAR | Status: AC
  Filled 2016-02-20: qty 2

## 2016-02-20 MED ORDER — SODIUM CHLORIDE 0.9 % IV SOLN: 250.0000 mL | INTRAVENOUS | Status: DC | PRN

## 2016-02-20 MED ORDER — HEPARIN SODIUM (PORCINE) 1000 UNIT/ML IJ SOLN
INTRAMUSCULAR | Status: DC | PRN
  Administered 2016-02-20: 2000 [IU] via INTRAVENOUS
  Administered 2016-02-20: 5500 [IU] via INTRAVENOUS
  Administered 2016-02-20: 4500 [IU] via INTRAVENOUS

## 2016-02-20 MED ORDER — HEPARIN SODIUM (PORCINE) 1000 UNIT/ML IJ SOLN
INTRAMUSCULAR | Status: AC
  Filled 2016-02-20: qty 1

## 2016-02-20 MED ORDER — IOPAMIDOL (ISOVUE-370) INJECTION 76%
INTRAVENOUS | Status: AC
  Filled 2016-02-20: qty 100

## 2016-02-20 MED ORDER — FAMOTIDINE IN NACL 20-0.9 MG/50ML-% IV SOLN
INTRAVENOUS | Status: DC | PRN
  Administered 2016-02-20: 20 mg via INTRAVENOUS

## 2016-02-20 MED ORDER — CLOPIDOGREL BISULFATE 300 MG PO TABS
ORAL_TABLET | ORAL | Status: AC
  Filled 2016-02-20: qty 2

## 2016-02-20 MED ORDER — CLOPIDOGREL BISULFATE 300 MG PO TABS
ORAL_TABLET | ORAL | Status: DC | PRN
  Administered 2016-02-20: 600 mg via ORAL

## 2016-02-20 MED ORDER — AMLODIPINE BESYLATE 5 MG PO TABS: 10.0000 mg | ORAL_TABLET | Freq: Every day | ORAL | Status: DC

## 2016-02-20 MED ORDER — PROPRANOLOL HCL 20 MG PO TABS: 20.0000 mg | ORAL_TABLET | Freq: Every day | ORAL | Status: DC

## 2016-02-20 MED ORDER — CLOPIDOGREL BISULFATE 75 MG PO TABS: 75.0000 mg | ORAL_TABLET | Freq: Every day | ORAL | 0 refills | Status: DC

## 2016-02-20 MED ORDER — SODIUM CHLORIDE 0.9% FLUSH: 3.0000 mL | Freq: Two times a day (BID) | INTRAVENOUS | Status: DC

## 2016-02-20 MED ORDER — IRBESARTAN 75 MG PO TABS
37.5000 mg | ORAL_TABLET | Freq: Every day | ORAL | Status: DC
  Filled 2016-02-20: qty 0.5

## 2016-02-20 MED ORDER — IOPAMIDOL (ISOVUE-370) INJECTION 76%
INTRAVENOUS | Status: DC | PRN
  Administered 2016-02-20: 140 mL via INTRA_ARTERIAL

## 2016-02-20 MED ORDER — ONDANSETRON HCL 4 MG/2ML IJ SOLN: 4.0000 mg | Freq: Four times a day (QID) | INTRAMUSCULAR | Status: DC | PRN

## 2016-02-20 MED ORDER — ANGIOPLASTY BOOK
Freq: Once | Status: DC
  Filled 2016-02-20: qty 1

## 2016-02-20 MED ORDER — HEPARIN (PORCINE) IN NACL 2-0.9 UNIT/ML-% IJ SOLN
INTRAMUSCULAR | Status: DC | PRN
  Administered 2016-02-20: 1000 mL

## 2016-02-20 MED ORDER — LIDOCAINE HCL (PF) 1 % IJ SOLN
INTRAMUSCULAR | Status: DC | PRN
  Administered 2016-02-20: 2 mL

## 2016-02-20 MED ORDER — ASPIRIN 81 MG PO CHEW
81.0000 mg | CHEWABLE_TABLET | ORAL | Status: AC
  Administered 2016-02-20: 81 mg via ORAL

## 2016-02-20 MED ORDER — SODIUM CHLORIDE 0.9 % WEIGHT BASED INFUSION
3.0000 mL/kg/h | INTRAVENOUS | Status: AC
  Administered 2016-02-20: 3 mL/kg/h via INTRAVENOUS

## 2016-02-20 MED ORDER — LABETALOL HCL 5 MG/ML IV SOLN: 10.0000 mg | INTRAVENOUS | Status: DC | PRN

## 2016-02-20 MED ORDER — VERAPAMIL HCL 2.5 MG/ML IV SOLN
INTRAVENOUS | Status: DC | PRN
  Administered 2016-02-20: 10 mL via INTRA_ARTERIAL

## 2016-02-20 MED ORDER — ASPIRIN EC 81 MG PO TBEC: 81.0000 mg | DELAYED_RELEASE_TABLET | Freq: Every evening | ORAL | Status: DC

## 2016-02-20 MED ORDER — CALCIUM CARBONATE ANTACID 500 MG PO CHEW
1.0000 | CHEWABLE_TABLET | Freq: Every day | ORAL | Status: DC
  Filled 2016-02-20 (×2): qty 1

## 2016-02-20 MED ORDER — HYDRALAZINE HCL 20 MG/ML IJ SOLN: 5.0000 mg | INTRAMUSCULAR | Status: DC | PRN

## 2016-02-20 MED ORDER — SODIUM CHLORIDE 0.9 % WEIGHT BASED INFUSION: 1.0000 mL/kg/h | INTRAVENOUS | Status: DC

## 2016-02-20 MED ORDER — NITROGLYCERIN 0.4 MG SL SUBL: 0.4000 mg | SUBLINGUAL_TABLET | SUBLINGUAL | 12 refills | Status: DC | PRN

## 2016-02-20 MED ORDER — VERAPAMIL HCL 2.5 MG/ML IV SOLN
INTRAVENOUS | Status: AC
  Filled 2016-02-20: qty 2

## 2016-02-20 MED ORDER — FAMOTIDINE IN NACL 20-0.9 MG/50ML-% IV SOLN
INTRAVENOUS | Status: AC
  Filled 2016-02-20: qty 50

## 2016-02-20 MED ORDER — FENTANYL CITRATE (PF) 100 MCG/2ML IJ SOLN
INTRAMUSCULAR | Status: AC
  Filled 2016-02-20: qty 2

## 2016-02-20 MED ORDER — ASPIRIN 81 MG PO CHEW
CHEWABLE_TABLET | ORAL | Status: AC
  Administered 2016-02-20: 81 mg via ORAL
  Filled 2016-02-20: qty 1

## 2016-02-20 MED ORDER — OMEGA-3-ACID ETHYL ESTERS 1 G PO CAPS: 2.0000 g | ORAL_CAPSULE | Freq: Two times a day (BID) | ORAL | Status: DC

## 2016-02-20 MED ORDER — CALCIUM CARBONATE ANTACID 500 MG PO CHEW
2.0000 | CHEWABLE_TABLET | Freq: Every day | ORAL | Status: DC | PRN
  Administered 2016-02-20: 400 mg via ORAL

## 2016-02-20 MED ORDER — PANTOPRAZOLE SODIUM 40 MG PO TBEC: 40.0000 mg | DELAYED_RELEASE_TABLET | Freq: Every day | ORAL | 6 refills | Status: DC

## 2016-02-20 MED ORDER — ACETAMINOPHEN 325 MG PO TABS: 650.0000 mg | ORAL_TABLET | ORAL | Status: DC | PRN

## 2016-02-20 MED ORDER — LIDOCAINE HCL (PF) 1 % IJ SOLN
INTRAMUSCULAR | Status: AC
  Filled 2016-02-20: qty 30

## 2016-02-20 MED ORDER — HEPARIN (PORCINE) IN NACL 2-0.9 UNIT/ML-% IJ SOLN
INTRAMUSCULAR | Status: AC
  Filled 2016-02-20: qty 1000

## 2016-02-20 MED FILL — CLOPIDOGREL 75 MG TABLET: 75 | 30 days supply | Qty: 30 | Fill #0

## 2016-02-20 SURGICAL SUPPLY — 18 items
BALLN MOZEC 2.50X14 (BALLOONS) ×2
BALLN ~~LOC~~ MOZEC 4.5X18 (BALLOONS) ×2
BALLOON MOZEC 2.50X14 (BALLOONS) IMPLANT
BALLOON ~~LOC~~ MOZEC 4.5X18 (BALLOONS) IMPLANT
CATH INFINITI 5 FR JL3.5 (CATHETERS) ×1 IMPLANT
CATH INFINITI JR4 5F (CATHETERS) ×1 IMPLANT
DEVICE RAD COMP TR BAND LRG (VASCULAR PRODUCTS) ×1 IMPLANT
GLIDESHEATH SLEND SS 6F .021 (SHEATH) ×1 IMPLANT
GUIDE CATH RUNWAY 6FR AL 75 (CATHETERS) ×1 IMPLANT
GUIDEWIRE INQWIRE 1.5J.035X260 (WIRE) IMPLANT
INQWIRE 1.5J .035X260CM (WIRE) ×2
KIT ENCORE 26 ADVANTAGE (KITS) ×1 IMPLANT
KIT HEART LEFT (KITS) ×2 IMPLANT
PACK CARDIAC CATHETERIZATION (CUSTOM PROCEDURE TRAY) ×2 IMPLANT
STENT SYNERGY DES 4X24 (Permanent Stent) ×1 IMPLANT
TRANSDUCER W/STOPCOCK (MISCELLANEOUS) ×2 IMPLANT
TUBING CIL FLEX 10 FLL-RA (TUBING) ×2 IMPLANT
WIRE COUGAR XT STRL 190CM (WIRE) ×1 IMPLANT

## 2016-02-20 NOTE — Progress Notes (Signed)
Client arrived from cath lab and c/o 4/10 left chest pressure and Dr Angelena Form in and per Dr Angelena Form will give tums as ordered

## 2016-02-20 NOTE — Progress Notes (Signed)
[id number] Education completed with pt and wife who voiced understanding.  Stressed importance of plavix with stent. Reviewed NTG use, risk factors, ex ed , watching carbs and heart healthy food choices. Discussed CRP 2 and pt thinks he may be able to do even though he needs toe surgery. Will refer to [place] CRP 2. Graylon Good RN BSN 02/20/2016 1:38 PM

## 2016-02-20 NOTE — H&P (View-Only) (Signed)
Cardiology Office Note    Date:  02/15/2016   ID:  Miguel Wolfe, DOB 11/20/1945, MRN [id number]  PCP:  Gennette Pac, MD  Cardiologist:   Candee Furbish, MD     History of Present Illness:  Miguel Wolfe is a 71 y.o. male here for follow-up of abnormal nuclear stress test showing inferolateral MI with peri-infarct ischemia, EF 51% on nuclear, 55% on echocardiogram. This was done secondary to dyspnea on exertion and performing yard work for instance. Been experiencing several months of fatigue, shortness of breath, energy decrease especially with yard work. Legs hurt bilaterally as well, constant, moderate, calf. One hour of yard work challenging. Interestingly however he is able to walk normally throughout the neighborhood without any symptoms. Flushing. He is not taking any niacin supplementation. ED as well. Has a toe surgery planned soon,  Dr. Doran Durand.  His father had coronary artery disease and he also carries the diagnosis of hypertension as well as hyperlipidemia. He is not a smoker. Weight has been under control.  He was also complaining of some myalgias and pravastatin was stopped, did not seem to help. Restarted and may have felt worse. Now off. Now back on and feels no different. Excellent bounding pulses distally. CK was checked and normal. He did smoke but quit cigarettes in 1982. His father at age 58 years old had an myocardial infarction and subsequently has had bypass and stenting still alive. Mother valve issue.   He has had elevated glucose in the past and has been treated with Actos formally.  Hemoglobin 16.3 platelets 163, glucose 107, creatinine 0.81, total cholesterol 200, triglycerides 207, LDL 116, HDL 42, TSH 1.5  EKG personally viewed from 12/29/15 shows sinus rhythm heart rate 63 with no other abnormalities.  Denies syncope, bleeding, orthopnea, PND. Right toe pain. He will need a pin placed. May be able to be done under local.  He used to work in the past for  [employer] in [place].   Past Medical History:  Diagnosis Date  . Colon polyps   . Dyslipidemia   . Dyspnea   . Fatigue   . GERD (gastroesophageal reflux disease)   . Hemorrhoids   . Hypertension   . Leg pain   . Myalgia     Past Surgical History:  Procedure Laterality Date  . COLONOSCOPY WITH ESOPHAGOGASTRODUODENOSCOPY (EGD)  2008  . PILONIDAL CYCT    . REFRACTIVE SURGERY      Current Medications: Outpatient Medications Prior to Visit  Medication Sig Dispense Refill  . AMLODIPINE BESYLATE PO Take 10 mg by mouth daily.    Marland Kitchen aspirin EC 81 MG tablet Take 81 mg by mouth daily.    Marland Kitchen omega-3 acid ethyl esters (LOVAZA) 1 g capsule Take 2 g by mouth 2 (two) times daily.    . pravastatin (PRAVACHOL) 40 MG tablet Take 40 mg by mouth daily.    . propranolol (INDERAL) 20 MG tablet Take 20 mg by mouth daily.  2  . ranitidine (ZANTAC) 75 MG tablet Take 75 mg by mouth 2 (two) times daily.    Marland Kitchen telmisartan (MICARDIS) 80 MG tablet Take 80 mg by mouth daily.    Marland Kitchen zolpidem (AMBIEN) 10 MG tablet Take 10 mg by mouth at bedtime as needed for sleep.     No facility-administered medications prior to visit.      Allergies:   Gemfibrozil; Niaspan [niacin er]; and Simvastatin   Social History   Social History  .  Marital status: Married    Spouse name: N/A  . Number of children: N/A  . Years of education: N/A   Social History Main Topics  . Smoking status: Former Smoker    Types: Cigarettes    Quit date: 02/01/1967  . Smokeless tobacco: Never Used  . Alcohol use Yes  . Drug use: No  . Sexual activity: Not Asked   Other Topics Concern  . None   Social History Narrative  . None     Family History:  The patient's family history includes Colon cancer in his paternal uncle; Crohn's disease in his sister; Healthy in his sister; Heart attack (age of onset: 66) in his father.   ROS:   Please see the history of present illness.    ROS All other systems  reviewed and are negative.   PHYSICAL EXAM:   VS:  BP 122/70   Pulse 68   Ht '5\' 11"'$  (1.803 m)   Wt 188 lb (85.3 kg)   BMI 26.22 kg/m    GEN: Well nourished, well developed, in no acute distress  HEENT: normal  Neck: no JVD, carotid bruits, or masses Cardiac: RRR; no murmurs, rubs, or gallops,no edema  Respiratory:  clear to auscultation bilaterally, normal work of breathing GI: soft, nontender, nondistended, + BS MS: no deformity or atrophy , 2+ dorsalis pedis pulses bilaterally, bounding Skin: warm and dry, no rash Neuro:  Alert and Oriented x 3, Strength and sensation are intact Psych: euthymic mood, full affect  Wt Readings from Last 3 Encounters:  02/15/16 188 lb (85.3 kg)  02/08/16 188 lb (85.3 kg)  02/06/16 188 lb 3.2 oz (85.4 kg)      Studies/Labs Reviewed:   EKG:  Prior EKG from December reviewed as above, normal sinus rhythm with no other abnormalities. EKG ordered today on 02/06/16 shows sinus rhythm 70 with possible left ventricular hypertrophy. No ST segment changes.  Recent Labs: No results found for requested labs within last 8760 hours.   Lipid Panel No results found for: CHOL, TRIG, HDL, CHOLHDL, VLDL, LDLCALC, LDLDIRECT  Additional studies/ records that were reviewed today include:  Former office notes, lab work, EKG reviewed  [place] 02/08/16  The patient walked for 7:00 of a standard Bruce protocol GXT . Peak HR of 153 which is 100% predicted maximal HR .  There were no ST or T wave changes to suggest ischemia. BP response to exercise was normal .  Defect 1: There is a medium defect of moderate severity present in the basal inferolateral, mid inferolateral and apical lateral location. This is consistent with a prior inferolateral MI with peri-infarct ischemia.  The left ventricular ejection fraction is mildly decreased (45-54%). Nuclear stress EF: 51%.  This is an intermediate risk study.   ECHO 02/08/16  - Left ventricle: The cavity size  was normal. Wall thickness was   increased in a pattern of mild LVH. Systolic function was normal.   The estimated ejection fraction was in the range of 55% to 60%.   Wall motion was normal; there were no regional wall motion   abnormalities. Doppler parameters are consistent with abnormal   left ventricular relaxation (grade 1 diastolic dysfunction). - Aortic valve: There was trivial regurgitation. - Left atrium: The atrium was mildly dilated.  ASSESSMENT:    1. Abnormal nuclear stress test   2. Shortness of breath   3. Essential hypertension      PLAN:  In order of problems listed above:  Dyspnea on  exertion  - Nuclear stress test was abnormal showing intermediate risk and possible inferolateral old MI possible ischemia as well  - I would like for him to proceed with cardiac catheterization, diagnostic given his symptoms which may be anginal.  - Note his father has history of MI/CAD.  - Risks of cardiac catheterization reviewed including stroke, heart attack, death, renal impairment, bleeding.  - Echo EF was reassuring with no wall motion abnormalities.  -  Occasional periodic flushing, not associated with any new supplements. He has not had any previous history of being hyper vagal, no prior syncopal episodes.  Essential hypertension  - Medications reviewed. Well-controlled per [name].  Hyperlipidemia  - Has had some challenges With possible myalgias in the past with pravastatin. This was stopped previously by [name]. He restarted it and thinksThat he feels no different. He is now going to continue with his statin. CPK was normal.  Leg pain  - Thankfully, his distal pedal pulses are bounding, excellent. No evidence of peripheral vascular disease. Unsure of cause of pain.  Preoperative cardiac risk stratification  - Given his dyspnea on exertion while doing yardwork for instance, and intermediate risk nuclear stress test I think would be wise for him to proceed with  heart catheterization prior to surgery. He does have an upcoming toe surgery, Dr. Doran Durand. This should be a relatively low risk procedure however I do make sure that he does not have any significant underlying CAD that needs to be addressed. We discussed the risks of heart catheterization 1:1000 stroke, heart attack, death, renal impairment, bleeding. His wife was present for discussion. Willing to proceed.   Medication Adjustments/Labs and Tests Ordered: Current medicines are reviewed at length with the patient today.  Concerns regarding medicines are outlined above.  Medication changes, Labs and Tests ordered today are listed in the Patient Instructions below. There are no Patient Instructions on file for this visit.   Signed, Candee Furbish, MD  02/15/2016 11:54 AM    [facility] [address], [place], [place]  [address] Phone: [phone]; Fax: [phone]

## 2016-02-20 NOTE — Discharge Summary (Signed)
Discharge Summary    Patient ID: Miguel Wolfe,  MRN: [id number], DOB/AGE: [birth year] 71 y.o.  Admit date: 02/20/2016 Discharge date: 02/20/2016  Primary Care Provider: Gennette Pac Primary Cardiologist: Dr. Marlou Porch  Discharge Diagnoses    Active Problems:   Unstable angina [facility])   CAD  Allergies Allergies  Allergen Reactions  . Gemfibrozil     Muscle pain  . Niaspan [Niacin Er]     flushing  . Simvastatin     unknown    Diagnostic Studies/Procedures    Coronary Stent Intervention  Left Heart Cath and Coronary Angiography  Conclusion     Acute Mrg lesion, 90 %stenosed.  Mid RCA lesion, 20 %stenosed.  Dist RCA lesion, 30 %stenosed.  Prox Cx to Mid Cx lesion, 20 %stenosed.  1st Diag lesion, 20 %stenosed.  Dist LAD lesion, 20 %stenosed.  A STENT SYNERGY DES 4X24 drug eluting stent was successfully placed.  Prox RCA to Mid RCA lesion, 80 %stenosed.  Post intervention, there is a 0% residual stenosis.   1. Severe single vessel CAD (severe stenosis mid RCA) 2. Successful PTCA/DES x 1 mid RCA  3. Mild non-obstructive disease Circumflex and LAD  Recommendations: Will continue DAPT with ASA and Plavix for one year. If he needs to interrupt anti-platelet therapy for a toe surgery, this could be considered at 3 months since a Synergy DES was placed, although it would be optimal to continue for at least 6 months. Will discharge home today as part of the same day PCI protocol. Will changed Prilosec to Protonix 40 mg daily. He will be started on Plavix 75 mg daily. Follow up with Dr. Marlou Porch or office APP in 1 week.       History of Present Illness     Miguel Wolfe is a 71 y.o. male here for scheduled cardiac cath for  abnormal nuclear stress test showing inferolateral MI with peri-infarct ischemia, EF 51% on nuclear, 55% on echocardiogram. This was done secondary to dyspnea on exertion and performing yard work for instance. Been experiencing several  months of fatigue, shortness of breath, energy decrease especially with yard work. Legs hurt bilaterally as well, constant, moderate, calf. One hour of yard work challenging. Interestingly however he is able to walk normally throughout the neighborhood without any symptoms. He is not taking any niacin supplementation. ED as well. Has a toe surgery planned soon,  Dr. Doran Durand.  His father had coronary artery disease and he also carries the diagnosis of hypertension as well as hyperlipidemia. He is not a smoker. Weight has been under control.  He was also complains of some myalgias and pravastatin was stopped, did not seem to help. Restarted and may have felt worse. Now off. Now back on and feels no different. Excellent bounding pulses distally. CK was checked and normal. He did smoke but quit cigarettes in 1982. His father at age 18 years old had an myocardial infarction and subsequently has had bypass and stenting still alive. Mother valve issue.   He has had elevated glucose in the past and has been treated with Actos formally.  Hemoglobin 16.3 platelets 163, glucose 107, creatinine 0.81, total cholesterol 200, triglycerides 207, LDL 116, HDL 42, TSH 1.5   Denies syncope, bleeding, orthopnea, PND. Right toe pain. He will need a pin placed. May be able to be done under local.  He used to work in the past for [employer] in [place].  Hospital Course     Consultants:  None  Patient presented for scheduled cath. Detailed cath report as above. S/p PTCA and DES to mid RCA. He does have mild non-obstructive CAD to Cx and LAD. 90% acute Mrg lesion. Will continue DAPT with ASA and Plavix for one year. If he needs to interrupt anti-platelet therapy for a toe surgery, this could be considered at 3 months since a Synergy DES was placed, although it would be optimal to continue for at least 6 months. Non immediate post PCI complication. Cath site without hematoma.   The patient has  been seen by Dr. Angelena Form  today and deemed ready for discharge home. All follow-up appointments have been scheduled. Discharge medications are listed below.    Discharge Vitals Blood pressure 109/69, pulse 68, temperature 98.7 F (37.1 C), resp. rate 12, height '5\' 11"'$  (1.803 m), weight 185 lb (83.9 kg), SpO2 98 %.  Filed Weights   02/20/16 0540  Weight: 185 lb (83.9 kg)    Labs & Radiologic Studies     CBC No results for input(s): WBC, NEUTROABS, HGB, HCT, MCV, PLT in the last 72 hours. Basic Metabolic Panel No results for input(s): NA, K, CL, CO2, GLUCOSE, BUN, CREATININE, CALCIUM, MG, PHOS in the last 72 hours. Liver Function Tests No results for input(s): AST, ALT, ALKPHOS, BILITOT, PROT, ALBUMIN in the last 72 hours. No results for input(s): LIPASE, AMYLASE in the last 72 hours. Cardiac Enzymes No results for input(s): CKTOTAL, CKMB, CKMBINDEX, TROPONINI in the last 72 hours. BNP Invalid input(s): POCBNP D-Dimer No results for input(s): DDIMER in the last 72 hours. Hemoglobin A1C No results for input(s): HGBA1C in the last 72 hours. Fasting Lipid Panel No results for input(s): CHOL, HDL, LDLCALC, TRIG, CHOLHDL, LDLDIRECT in the last 72 hours. Thyroid Function Tests No results for input(s): TSH, T4TOTAL, T3FREE, THYROIDAB in the last 72 hours.  Invalid input(s): FREET3  No results found.  Disposition   Pt is being discharged home today in good condition.  Follow-up Plans & Appointments    Follow-up Information    Angelena Form, PA-C. Go on 02/27/2016.   Specialties:  Cardiology, Radiology Why:  '@11am'$  POST PCI Contact information: [place] [address] [phone]          Discharge Instructions    Amb Referral to Cardiac Rehabilitation    Complete by:  As directed    Referring to [facility]   Diagnosis:  Coronary Stents   Diet - low sodium heart healthy    Complete by:  As directed    Discharge instructions     Complete by:  As directed    No driving for 48 hours. No lifting over 5 lbs for 1 week. No sexual activity for 1 week. You may return to work after seen in clinic next week. Keep procedure site clean & dry. If you notice increased pain, swelling, bleeding or pus, call/return!  You may shower, but no soaking baths/hot tubs/pools for 1 week.  Some studies suggest Prilosec/Omeprazole interacts with Plavix. We changed your Prilosec/Omeprazole to Protonix for less chance of interaction.   Increase activity slowly    Complete by:  As directed       Discharge Medications   Current Discharge Medication List    START taking these medications   Details  clopidogrel (PLAVIX) 75 MG tablet Take 1 tablet (75 mg total) by mouth daily with breakfast. Qty: 30 tablet, Refills: 11    nitroGLYCERIN (NITROSTAT) 0.4 MG SL tablet Place  1 tablet (0.4 mg total) under the tongue every 5 (five) minutes as needed for chest pain. Qty: 25 tablet, Refills: 12    pantoprazole (PROTONIX) 40 MG tablet Take 1 tablet (40 mg total) by mouth daily. Qty: 30 tablet, Refills: 6      CONTINUE these medications which have NOT CHANGED   Details  amLODipine (NORVASC) 10 MG tablet Take 10 mg by mouth daily.    aspirin EC 81 MG tablet Take 81 mg by mouth every evening.     ibuprofen (ADVIL,MOTRIN) 200 MG tablet Take 200 mg by mouth 2 (two) times daily as needed for headache or moderate pain.    omega-3 acid ethyl esters (LOVAZA) 1 g capsule Take 2 g by mouth 2 (two) times daily.    pravastatin (PRAVACHOL) 40 MG tablet Take 40 mg by mouth every evening.     propranolol (INDERAL) 20 MG tablet Take 20 mg by mouth daily. Refills: 2    telmisartan (MICARDIS) 80 MG tablet Take 80 mg by mouth daily.    zolpidem (AMBIEN) 10 MG tablet Take 3 mg by mouth at bedtime as needed for sleep.     calcium carbonate (TUMS - DOSED IN MG ELEMENTAL CALCIUM) 500 MG chewable tablet Chew 2 tablets by mouth daily as needed for indigestion or  heartburn.      STOP taking these medications     omeprazole (PRILOSEC) 20 MG capsule           Outstanding Labs/Studies   None  Duration of Discharge Encounter   Greater than 30 minutes including physician time.  Signed, Daune Perch PA-C 02/20/2016, 2:04 PM

## 2016-02-20 NOTE — Interval H&P Note (Signed)
History and Physical Interval Note:  02/20/2016 7:26 AM  Miguel Wolfe  has presented today for cardiac cath with the diagnosis of abnormal nuclear study, sob  The various methods of treatment have been discussed with the patient and family. After consideration of risks, benefits and other options for treatment, the patient has consented to  Procedure(s): Left Heart Cath and Coronary Angiography (N/A) as a surgical intervention .  The patient's history has been reviewed, patient examined, no change in status, stable for surgery.  I have reviewed the patient's chart and labs.  Questions were answered to the patient's satisfaction.    Cath Lab Visit (complete for each Cath Lab visit)  Clinical Evaluation Leading to the Procedure:   ACS: No.  Non-ACS:    Anginal Classification: CCS II  Anti-ischemic medical therapy: Minimal Therapy (1 class of medications)  Non-Invasive Test Results: Intermediate-risk stress test findings: cardiac mortality 1-3%/year  Prior CABG: No previous CABG         Lauree Chandler

## 2016-02-20 NOTE — Discharge Instructions (Signed)

## 2016-02-22 ENCOUNTER — Telehealth: Payer: Self-pay | Admitting: Cardiology

## 2016-02-22 NOTE — Progress Notes (Signed)
Cardiology Office Note    Date:  02/23/2016   ID:  Miguel Wolfe, DOB 07-May-1945, MRN [id number]  PCP:  Gennette Pac, MD  Cardiologist:  Dr. Marlou Porch  CC: post PCI follow up  History of Present Illness:  Miguel Wolfe is a 71 y.o. male with a history of recently diagnosed CAD s/p DES to RCA (02/20/16), HLD, HTN and GERD who presents to clinic for post PCI follow up.   He was recently seen in the office by Dr. Marlou Porch for evaluation of DOE performing yard work. Stress test was abnormal with inferolateral MI with peri-infarct ischemia, EF 51%. 2D ECHO showed normal LV function, mild LVH and mild LAR. He was set up for cardiac cath on 02/20/16 and s/p PTCA and DES to mid RCA. He does have mild non-obstructive CAD to Cx and LAD. 90% acute Mrg lesion. Started on DAPT with ASA and Plavix for one year. If he needs to interrupt anti-platelet therapy for a toe surgery, this could be considered at 3 months since a Synergy DES was placed, although it would be optimal to continue for at least 6 months.  Today he presents to clinic for follow up. Appointment was moved up because he has had a couple issues. He has had a HA since the cath that has improved some today. Also, since the heart cath he has noticed some indigestion that's worse after eating and better with TUMs. He also has had some pressures over his heart that he felt with exertion and also after eating. He has been walking 8 minutes as prescribed by cardiac rehab. This caused him to have chest pressure that quickly resolved with rest. He then started walking again and it didn't come back. No SOB with walking. Has not been out doing yard work yet. No LE edema, orthopnea or PND. No dizziness or syncope. No blood in stool or urine. Has chronic LE pain that is not exertional.     Past Medical History:  Diagnosis Date  . Colon polyps   . Dyslipidemia   . Dyspnea   . Fatigue   . GERD (gastroesophageal reflux disease)   . Hemorrhoids   .  Hypertension   . Leg pain   . Myalgia     Past Surgical History:  Procedure Laterality Date  . COLONOSCOPY WITH ESOPHAGOGASTRODUODENOSCOPY (EGD)  2008  . CORONARY STENT INTERVENTION N/A 02/20/2016   Procedure: Coronary Stent Intervention;  Surgeon: Burnell Blanks, MD;  Location: [facility];  Service: Cardiovascular;  Laterality: N/A;  . LEFT HEART CATH AND CORONARY ANGIOGRAPHY N/A 02/20/2016   Procedure: Left Heart Cath and Coronary Angiography;  Surgeon: Burnell Blanks, MD;  Location: [facility];  Service: Cardiovascular;  Laterality: N/A;  . PILONIDAL CYCT    . REFRACTIVE SURGERY      Current Medications: Outpatient Medications Prior to Visit  Medication Sig Dispense Refill  . amLODipine (NORVASC) 10 MG tablet Take 10 mg by mouth daily.    Marland Kitchen aspirin EC 81 MG tablet Take 81 mg by mouth every evening.     . calcium carbonate (TUMS - DOSED IN MG ELEMENTAL CALCIUM) 500 MG chewable tablet Chew 2 tablets by mouth daily as needed for indigestion or heartburn.    Marland Kitchen ibuprofen (ADVIL,MOTRIN) 200 MG tablet Take 200 mg by mouth 2 (two) times daily as needed for headache or moderate pain.    . nitroGLYCERIN (NITROSTAT) 0.4 MG SL tablet Place 1 tablet (0.4 mg total) under  the tongue every 5 (five) minutes as needed for chest pain. 25 tablet 12  . omega-3 acid ethyl esters (LOVAZA) 1 g capsule Take 2 g by mouth 2 (two) times daily.    . pravastatin (PRAVACHOL) 40 MG tablet Take 40 mg by mouth every evening.     . propranolol (INDERAL) 20 MG tablet Take 20 mg by mouth daily.  2  . telmisartan (MICARDIS) 80 MG tablet Take 80 mg by mouth daily.    Marland Kitchen zolpidem (AMBIEN) 10 MG tablet Take 3 mg by mouth at bedtime as needed for sleep.     . clopidogrel (PLAVIX) 75 MG tablet Take 1 tablet (75 mg total) by mouth daily with breakfast. 30 tablet 11  . pantoprazole (PROTONIX) 40 MG tablet Take 1 tablet (40 mg total) by mouth daily. 30 tablet 6   No facility-administered  medications prior to visit.      Allergies:   Gemfibrozil; Niaspan [niacin er]; and Simvastatin   Social History   Social History  . Marital status: Married    Spouse name: N/A  . Number of children: N/A  . Years of education: N/A   Social History Main Topics  . Smoking status: Former Smoker    Types: Cigarettes    Quit date: 02/01/1967  . Smokeless tobacco: Never Used  . Alcohol use Yes  . Drug use: No  . Sexual activity: Not Asked   Other Topics Concern  . None   Social History Narrative  . None     Family History:  The patient's family history includes Colon cancer in his paternal uncle; Crohn's disease in his sister; Healthy in his sister; Heart attack (age of onset: 69) in his father.     ROS:   Please see the history of present illness.    ROS All other systems reviewed and are negative.   PHYSICAL EXAM:   VS:  BP 132/72   Pulse 66   Ht '5\' 11"'$  (1.803 m)   Wt 186 lb (84.4 kg)   BMI 25.94 kg/m    GEN: Well nourished, well developed, in no acute distress  HEENT: normal  Neck: no JVD, carotid bruits, or masses Cardiac: RRR; no murmurs, rubs, or gallops,no edema  Respiratory:  clear to auscultation bilaterally, normal work of breathing GI: soft, nontender, nondistended, + BS MS: no deformity or atrophy  Skin: warm and dry, no rash Neuro:  Alert and Oriented x 3, Strength and sensation are intact Psych: euthymic mood, full affect    Wt Readings from Last 3 Encounters:  02/23/16 186 lb (84.4 kg)  02/20/16 185 lb (83.9 kg)  02/15/16 188 lb (85.3 kg)      Studies/Labs Reviewed:   EKG:  EKG is ordered today.  The ekg ordered today demonstrates NSR HR 66, LVH  Recent Labs: 02/15/2016: BUN 15; Creatinine, Ser 0.81; Platelets 187; Potassium 4.0; Sodium 143   Lipid Panel No results found for: CHOL, TRIG, HDL, CHOLHDL, VLDL, LDLCALC, LDLDIRECT  Additional studies/ records that were reviewed today include:   02/20/16 Coronary Stent Intervention  Left  Heart Cath and Coronary Angiography  Conclusion     Acute Mrg lesion, 90 %stenosed.  Mid RCA lesion, 20 %stenosed.  Dist RCA lesion, 30 %stenosed.  Prox Cx to Mid Cx lesion, 20 %stenosed.  1st Diag lesion, 20 %stenosed.  Dist LAD lesion, 20 %stenosed.  A STENT SYNERGY DES 4X24 drug eluting stent was successfully placed.  Prox RCA to Mid RCA lesion, 80 %stenosed.  Post intervention, there is a 0% residual stenosis.  1. Severe single vessel CAD (severe stenosis mid RCA) 2. Successful PTCA/DES x 1 mid RCA  3. Mild non-obstructive disease Circumflex and LAD  Recommendations: Will continue DAPT with ASA and Plavix for one year. If he needs to interrupt anti-platelet therapy for a toe surgery, this could be considered at 3 months since a Synergy DES was placed, although it would be optimal to continue for at least 6 months. Will discharge home today as part of the same day PCI protocol. Will changed Prilosec to Protonix 40 mg daily. He will be started on Plavix 75 mg daily. Follow up with Dr. Marlou Porch or office APP in 1 week.      2D ECHO: 02/08/2016 LV EF: 55% -   60% Study Conclusions - Left ventricle: The cavity size was normal. Wall thickness was   increased in a pattern of mild LVH. Systolic function was normal.   The estimated ejection fraction was in the range of 55% to 60%.   Wall motion was normal; there were no regional wall motion   abnormalities. Doppler parameters are consistent with abnormal   left ventricular relaxation (grade 1 diastolic dysfunction). - Aortic valve: There was trivial regurgitation. - Left atrium: The atrium was mildly dilated. - Atrial septum: No defect or patent foramen ovale was identified.   ASSESSMENT & PLAN:   CAD: continue ASA, statin and BB. Given his preference of PPI (Prilosec), will change plavix to Effient to avoid potential interaction.  HTN: BP well controlled today  HLD: TC 200, TG 207, LDL 116, HDL 42, TSH 1.5. Continue  pravastatin '40mg'$  daily.   GERD: PPI recent switched from prilosec to protonix given potential interaction with plavix. He has had worsening of his GERD. Will put back on prilosec and stop plavix and start effient.   Medication Adjustments/Labs and Tests Ordered: Current medicines are reviewed at length with the patient today.  Concerns regarding medicines are outlined above.  Medication changes, Labs and Tests ordered today are listed in the Patient Instructions below. Patient Instructions  Medication Instructions:  Your physician has recommended you make the following change in your medication:  1.  STOP the Protonix 2.  STOP the Plavix 3.  START Effient 10 mg taking 1 tablet daily 4.  START Prilosec 20 mg taking 1 tablet daily  Labwork: None ordered  Testing/Procedures: None ordered  Follow-Up: Your physician recommends that you schedule a follow-up appointment in: 3 MONTHS WITH DR. Marlou Porch   Any Other Special Instructions Will Be Listed Below (If Applicable).     If you need a refill on your cardiac medications before your next appointment, please call your pharmacy.      Signed, Angelena Form, PA-C  02/23/2016 3:30 PM    [facility] [address], [place], [place]  [address] Phone: [phone]; Fax: [phone]

## 2016-02-22 NOTE — Telephone Encounter (Signed)
New Message    Please call needs to speak to you , he is having a lot of acid reflux and pressure in his chest since Monday, and a headache

## 2016-02-22 NOTE — Telephone Encounter (Signed)
Follow Up    Pt is calling again regarding the acid reflux and would like a return phone call

## 2016-02-22 NOTE — Telephone Encounter (Signed)
Pt calling in reporting HA since after his stent placement Monday.  Reports gas/reflux since Monday night -- states it is bad reflux.  (pt started Protonix yesterday morning per d/c instructions, was on Prilosec but stopped secondary to stent/Plavix).   He is also experiencing some chest pressure -- it is not sharp, it is not the entire heart but "where I think the stent is and then it goes away".  He has not experienced any chest pressure today, only yesterday. Denies edema, wt gain, SOB.  States he "can perform ADLs and walk the stairs ok without SOB".  Had pt take BP while on phone - 150/87, but he feels this is related to the anxiety he is currently feeling about reflux/chest pressure. Advised pt to keep monitoring and call office if any of the above mentioned symptoms worsen.  He understands I will forward this to Dr. Marlou Porch for advisement (pt asking if he needs to be seen sooner than post hosp f/u appt Monday w/ Nell Range) and call him once reviewed by physician.

## 2016-02-22 NOTE — Telephone Encounter (Signed)
**Note De-Identified Tecia Cinnamon Obfuscation** The pts appt with Angelena Form, PA has been moved up to tomorrow at 2 pm. The pt is very appreciative and verbalized understanding. He thanked me for moving his appt up.

## 2016-02-23 ENCOUNTER — Ambulatory Visit (INDEPENDENT_AMBULATORY_CARE_PROVIDER_SITE_OTHER): Payer: Medicare Other | Admitting: Physician Assistant

## 2016-02-23 ENCOUNTER — Encounter: Payer: Self-pay | Admitting: Physician Assistant

## 2016-02-23 VITALS — BP 132/72 | HR 66 | Ht 71.0 in | Wt 186.0 lb

## 2016-02-23 DIAGNOSIS — E785 Hyperlipidemia, unspecified: Secondary | ICD-10-CM

## 2016-02-23 DIAGNOSIS — I2510 Atherosclerotic heart disease of native coronary artery without angina pectoris: Secondary | ICD-10-CM

## 2016-02-23 DIAGNOSIS — I10 Essential (primary) hypertension: Secondary | ICD-10-CM | POA: Diagnosis not present

## 2016-02-23 DIAGNOSIS — K219 Gastro-esophageal reflux disease without esophagitis: Secondary | ICD-10-CM

## 2016-02-23 MED ORDER — PRASUGREL HCL 10 MG PO TABS: 10.0000 mg | ORAL_TABLET | Freq: Every day | ORAL | 3 refills | Status: DC

## 2016-02-23 MED ORDER — OMEPRAZOLE 20 MG PO CPDR: 20.0000 mg | DELAYED_RELEASE_CAPSULE | Freq: Every day | ORAL | 3 refills | Status: DC

## 2016-02-23 NOTE — Patient Instructions (Signed)
Medication Instructions:  Your physician has recommended you make the following change in your medication:  1.  STOP the Protonix 2.  STOP the Plavix 3.  START Effient 10 mg taking 1 tablet daily 4.  START Prilosec 20 mg taking 1 tablet daily  Labwork: None ordered  Testing/Procedures: None ordered  Follow-Up: Your physician recommends that you schedule a follow-up appointment in: 3 MONTHS WITH DR. Marlou Porch   Any Other Special Instructions Will Be Listed Below (If Applicable).     If you need a refill on your cardiac medications before your next appointment, please call your pharmacy.

## 2016-02-24 NOTE — Addendum Note (Signed)
Addended by: Marlis Edelson C on: 02/24/2016 11:27 AM   Modules accepted: Orders

## 2016-02-27 ENCOUNTER — Encounter: Payer: Medicare Other | Admitting: Physician Assistant

## 2016-03-08 ENCOUNTER — Encounter: Payer: Self-pay | Admitting: Cardiology

## 2016-03-12 DIAGNOSIS — H35033 Hypertensive retinopathy, bilateral: Secondary | ICD-10-CM | POA: Diagnosis not present

## 2016-03-12 DIAGNOSIS — H524 Presbyopia: Secondary | ICD-10-CM | POA: Diagnosis not present

## 2016-03-13 ENCOUNTER — Other Ambulatory Visit (HOSPITAL_COMMUNITY): Payer: Self-pay | Admitting: Cardiac Rehabilitation

## 2016-03-13 DIAGNOSIS — Z955 Presence of coronary angioplasty implant and graft: Secondary | ICD-10-CM

## 2016-03-14 DIAGNOSIS — Z86018 Personal history of other benign neoplasm: Secondary | ICD-10-CM | POA: Diagnosis not present

## 2016-03-14 DIAGNOSIS — D225 Melanocytic nevi of trunk: Secondary | ICD-10-CM | POA: Diagnosis not present

## 2016-03-14 DIAGNOSIS — L821 Other seborrheic keratosis: Secondary | ICD-10-CM | POA: Diagnosis not present

## 2016-03-14 DIAGNOSIS — D2272 Melanocytic nevi of left lower limb, including hip: Secondary | ICD-10-CM | POA: Diagnosis not present

## 2016-03-14 DIAGNOSIS — Z23 Encounter for immunization: Secondary | ICD-10-CM | POA: Diagnosis not present

## 2016-03-20 ENCOUNTER — Telehealth (HOSPITAL_COMMUNITY): Payer: Self-pay | Admitting: Family Medicine

## 2016-03-20 NOTE — Telephone Encounter (Signed)
Patient has Medicare A/B and BC[employer] Medicare supplement. Insurance benefits verified for Cardiac Rehab: BC[employer] Medicare supplement follows Medicare guidelines: no co-payment, no deductible, no out of pocket max, and no co-insurance. Passport/reference [phone].

## 2016-03-26 MED ORDER — CLOPIDOGREL BISULFATE 75 MG PO TABS: 75.0000 mg | ORAL_TABLET | Freq: Every day | ORAL | 3 refills | Status: DC

## 2016-03-26 MED ORDER — PANTOPRAZOLE SODIUM 40 MG PO TBEC: 40.0000 mg | DELAYED_RELEASE_TABLET | Freq: Every day | ORAL | 11 refills | Status: DC

## 2016-04-03 ENCOUNTER — Encounter (HOSPITAL_COMMUNITY): Payer: Self-pay

## 2016-04-03 ENCOUNTER — Encounter (HOSPITAL_COMMUNITY)
Admission: RE | Admit: 2016-04-03 | Discharge: 2016-04-03 | Disposition: A | Discharge status: Home / Self Care | Payer: Medicare Other | Source: Ambulatory Visit | Attending: Cardiology | Admitting: Cardiology

## 2016-04-03 VITALS — BP 133/74 | HR 70 | Ht 71.5 in | Wt 183.2 lb

## 2016-04-03 DIAGNOSIS — Z955 Presence of coronary angioplasty implant and graft: Secondary | ICD-10-CM | POA: Diagnosis not present

## 2016-04-03 HISTORY — DX: Hyperlipidemia, unspecified: E78.5

## 2016-04-03 NOTE — Progress Notes (Signed)
Cardiac Individual Treatment Plan  Patient Details  Name: Miguel Wolfe MRN: [id number] Date of Birth: 1945/04/19 Referring Provider:     CARDIAC REHAB PHASE II ORIENTATION from 04/03/2016 in [place]  Referring Provider  Candee Furbish MD      Initial Encounter Date:    CARDIAC REHAB PHASE II ORIENTATION from 04/03/2016 in [place]  Date  04/03/16  Referring Provider  Candee Furbish MD      Visit Diagnosis: 02/20/16 Status post coronary artery stent placement  Patient's Home Medications on Admission:  Current Outpatient Prescriptions:  .  amLODipine (NORVASC) 10 MG tablet, Take 10 mg by mouth daily., Disp: , Rfl:  .  aspirin EC 81 MG tablet, Take 81 mg by mouth every evening. , Disp: , Rfl:  .  calcium carbonate (TUMS - DOSED IN MG ELEMENTAL CALCIUM) 500 MG chewable tablet, Chew 2 tablets by mouth daily as needed for indigestion or heartburn., Disp: , Rfl:  .  cetirizine (ZYRTEC) 10 MG tablet, Take 10 mg by mouth daily., Disp: , Rfl:  .  clopidogrel (PLAVIX) 75 MG tablet, Take 1 tablet (75 mg total) by mouth daily., Disp: 90 tablet, Rfl: 3 .  ibuprofen (ADVIL,MOTRIN) 200 MG tablet, Take 200 mg by mouth 2 (two) times daily as needed for headache or moderate pain., Disp: , Rfl:  .  losartan (COZAAR) 100 MG tablet, Take 100 mg by mouth daily., Disp: , Rfl: 0 .  nitroGLYCERIN (NITROSTAT) 0.4 MG SL tablet, Place 1 tablet (0.4 mg total) under the tongue every 5 (five) minutes as needed for chest pain., Disp: 25 tablet, Rfl: 12 .  omega-3 acid ethyl esters (LOVAZA) 1 g capsule, Take 2 g by mouth 2 (two) times daily., Disp: , Rfl:  .  pantoprazole (PROTONIX) 40 MG tablet, Take 1 tablet (40 mg total) by mouth daily., Disp: 30 tablet, Rfl: 11 .  pravastatin (PRAVACHOL) 40 MG tablet, Take 40 mg by mouth every evening. , Disp: , Rfl:  .  propranolol (INDERAL) 20 MG tablet, Take 20 mg by mouth daily., Disp: , Rfl: 2 .  zolpidem  (AMBIEN) 10 MG tablet, Take 3 mg by mouth at bedtime as needed for sleep. , Disp: , Rfl:   Past Medical History: Past Medical History:  Diagnosis Date  . Colon polyps   . Coronary artery disease   . Dyslipidemia   . Dyspnea   . Fatigue   . GERD (gastroesophageal reflux disease)   . Hemorrhoids   . Hyperlipidemia   . Hypertension   . Leg pain   . Myalgia     Tobacco Use: History  Smoking Status  . Former Smoker  . Types: Cigarettes  . Quit date: 02/01/1967  Smokeless Tobacco  . Never Used    Labs: Recent Review Flowsheet Data    There is no flowsheet data to display.      Capillary Blood Glucose: No results found for: GLUCAP   Exercise Target Goals: Date: 04/03/16  Exercise Program Goal: Individual exercise prescription set with THRR, safety & activity barriers. Participant demonstrates ability to understand and report RPE using BORG scale, to self-measure pulse accurately, and to acknowledge the importance of the exercise prescription.  Exercise Prescription Goal: Starting with aerobic activity 30 plus minutes a day, 3 days per week for initial exercise prescription. Provide home exercise prescription and guidelines that participant acknowledges understanding prior to discharge.  Activity Barriers & Risk Stratification:  Activity Barriers & Cardiac Risk Stratification - 04/03/16 1046      Activity Barriers & Cardiac Risk Stratification   Activity Barriers None   Cardiac Risk Stratification High      6 Minute Walk:     6 Minute Walk    Row Name 04/03/16 0935         6 Minute Walk   Phase Initial     Distance 1775 feet     Walk Time 6 minutes     # of Rest Breaks 0     MPH 3.36     METS 3.95     RPE 11     VO2 Peak 13.83     Symptoms No     Resting HR 70 bpm     Resting BP 133/74     Max Ex. HR 88 bpm     Max Ex. BP 132/62     2 Minute Post BP 118/64        Oxygen Initial Assessment:   Oxygen Re-Evaluation:   Oxygen Discharge  (Final Oxygen Re-Evaluation):   Initial Exercise Prescription:     Initial Exercise Prescription - 04/03/16 0900      Date of Initial Exercise RX and Referring Provider   Date 04/03/16   Referring Provider Candee Furbish MD     Treadmill   MPH 2.5   Grade 2   Minutes 10   METs 3.6     Bike   Level 1.3   Minutes 10   METs 3.98     NuStep   Level 3   SPM 70   Minutes 10   METs 3     Prescription Details   Frequency (times per week) 3   Duration Progress to 30 minutes of continuous aerobic without signs/symptoms of physical distress     Intensity   THRR 40-80% of Max Heartrate 61-122   Ratings of Perceived Exertion 11-13   Perceived Dyspnea 0-4     Progression   Progression Continue to progress workloads to maintain intensity without signs/symptoms of physical distress.     Resistance Training   Training Prescription Yes   Weight 3lbs   Reps 10-15      Perform Capillary Blood Glucose checks as needed.  Exercise Prescription Changes:   Exercise Comments:   Exercise Goals and Review:      Exercise Goals    Row Name 04/03/16 1050             Exercise Goals   Increase Physical Activity Yes       Intervention Provide advice, education, support and counseling about physical activity/exercise needs.;Develop an individualized exercise prescription for aerobic and resistive training based on initial evaluation findings, risk stratification, comorbidities and participant's personal goals.       Expected Outcomes Achievement of increased cardiorespiratory fitness and enhanced flexibility, muscular endurance and strength shown through measurements of functional capacity and personal statement of participant.       Increase Strength and Stamina Yes  have more energy in the afternoon and over the long term be able to hike 6-7 miles in the [employer] Provide advice, education, support and counseling about physical activity/exercise needs.;Develop  an individualized exercise prescription for aerobic and resistive training based on initial evaluation findings, risk stratification, comorbidities and participant's personal goals.       Expected Outcomes Achievement of increased cardiorespiratory fitness and enhanced flexibility, muscular endurance and strength shown through measurements of  functional capacity and personal statement of participant.          Exercise Goals Re-Evaluation :    Discharge Exercise Prescription (Final Exercise Prescription Changes):   Nutrition:  Target Goals: Understanding of nutrition guidelines, daily intake of sodium '1500mg'$ , cholesterol '200mg'$ , calories 30% from fat and 7% or less from saturated fats, daily to have 5 or more servings of fruits and vegetables.  Biometrics:     Pre Biometrics - 04/03/16 0936      Pre Biometrics   Height 5' 11.5" (1.816 m)   Weight 183 lb 3.2 oz (83.1 kg)   Waist Circumference 39 inches   Hip Circumference 39 inches   Waist to Hip Ratio 1 %   BMI (Calculated) 25.2   Triceps Skinfold 19 mm   % Body Fat 26.8 %   Grip Strength 43 kg   Flexibility 0 in   Single Leg Stand 30 seconds       Nutrition Therapy Plan and Nutrition Goals:   Nutrition Discharge: Nutrition Scores:   Nutrition Goals Re-Evaluation:   Nutrition Goals Re-Evaluation:   Nutrition Goals Discharge (Final Nutrition Goals Re-Evaluation):   Psychosocial: Target Goals: Acknowledge presence or absence of significant depression and/or stress, maximize coping skills, provide positive support system. Participant is able to verbalize types and ability to use techniques and skills needed for reducing stress and depression.  Initial Review & Psychosocial Screening:     Initial Psych Review & Screening - 04/03/16 1616      Initial Review   Current issues with None Identified     Family Dynamics   Good Support System? Yes     Barriers   Psychosocial barriers to participate in program  There are no identifiable barriers or psychosocial needs.     Screening Interventions   Interventions Encouraged to exercise      Quality of Life Scores:     Quality of Life - 04/03/16 0844      Quality of Life Scores   Health/Function Pre 25.96 %   Socioeconomic Pre 25.14 %   Psych/Spiritual Pre 28.75 %   Family Pre 27.6 %   GLOBAL Pre 26.56 %      PHQ-9: Recent Review Flowsheet Data    There is no flowsheet data to display.     Interpretation of Total Score  Total Score Depression Severity:  1-4 = Minimal depression, 5-9 = Mild depression, 10-14 = Moderate depression, 15-19 = Moderately severe depression, 20-27 = Severe depression   Psychosocial Evaluation and Intervention:   Psychosocial Re-Evaluation:   Psychosocial Discharge (Final Psychosocial Re-Evaluation):   Vocational Rehabilitation: Provide vocational rehab assistance to qualifying candidates.   Vocational Rehab Evaluation & Intervention:     Vocational Rehab - 04/03/16 1614      Initial Vocational Rehab Evaluation & Intervention   Assessment shows need for Vocational Rehabilitation No  Mr Holst is retired and does not need vocational rehab a tthis time.      Education: Education Goals: Education classes will be provided on a weekly basis, covering required topics. Participant will state understanding/return demonstration of topics presented.  Learning Barriers/Preferences:     Learning Barriers/Preferences - 04/03/16 0836      Learning Barriers/Preferences   Learning Barriers Sight   Learning Preferences Written Material;Verbal Instruction;Skilled Demonstration;Video;Pictoral      Education Topics: Count Your Pulse:  -Group instruction provided by verbal instruction, demonstration, patient participation and written materials to support subject.  Instructors address importance of being able to find  your pulse and how to count your pulse when at home without a heart monitor.  Patients  get hands on experience counting their pulse with staff help and individually.   Heart Attack, Angina, and Risk Factor Modification:  -Group instruction provided by verbal instruction, video, and written materials to support subject.  Instructors address signs and symptoms of angina and heart attacks.    Also discuss risk factors for heart disease and how to make changes to improve heart health risk factors.   Functional Fitness:  -Group instruction provided by verbal instruction, demonstration, patient participation, and written materials to support subject.  Instructors address safety measures for doing things around the house.  Discuss how to get up and down off the floor, how to pick things up properly, how to safely get out of a chair without assistance, and balance training.   Meditation and Mindfulness:  -Group instruction provided by verbal instruction, patient participation, and written materials to support subject.  Instructor addresses importance of mindfulness and meditation practice to help reduce stress and improve awareness.  Instructor also leads participants through a meditation exercise.    Stretching for Flexibility and Mobility:  -Group instruction provided by verbal instruction, patient participation, and written materials to support subject.  Instructors lead participants through series of stretches that are designed to increase flexibility thus improving mobility.  These stretches are additional exercise for major muscle groups that are typically performed during regular warm up and cool down.   Hands Only CPR Anytime:  -Group instruction provided by verbal instruction, video, patient participation and written materials to support subject.  Instructors co-teach with AHA video for hands only CPR.  Participants get hands on experience with mannequins.   Nutrition I class: Heart Healthy Eating:  -Group instruction provided by PowerPoint slides, verbal discussion, and  written materials to support subject matter. The instructor gives an explanation and review of the Therapeutic Lifestyle Changes diet recommendations, which includes a discussion on lipid goals, dietary fat, sodium, fiber, plant stanol/sterol esters, sugar, and the components of a well-balanced, healthy diet.   Nutrition II class: Lifestyle Skills:  -Group instruction provided by PowerPoint slides, verbal discussion, and written materials to support subject matter. The instructor gives an explanation and review of label reading, grocery shopping for heart health, heart healthy recipe modifications, and ways to make healthier choices when eating out.   Diabetes Question & Answer:  -Group instruction provided by PowerPoint slides, verbal discussion, and written materials to support subject matter. The instructor gives an explanation and review of diabetes co-morbidities, pre- and post-prandial blood glucose goals, pre-exercise blood glucose goals, signs, symptoms, and treatment of hypoglycemia and hyperglycemia, and foot care basics.   Diabetes Blitz:  -Group instruction provided by PowerPoint slides, verbal discussion, and written materials to support subject matter. The instructor gives an explanation and review of the physiology behind type 1 and type 2 diabetes, diabetes medications and rational behind using different medications, pre- and post-prandial blood glucose recommendations and Hemoglobin A1c goals, diabetes diet, and exercise including blood glucose guidelines for exercising safely.    Portion Distortion:  -Group instruction provided by PowerPoint slides, verbal discussion, written materials, and food models to support subject matter. The instructor gives an explanation of serving size versus portion size, changes in portions sizes over the last 20 years, and what consists of a serving from each food group.   Stress Management:  -Group instruction provided by verbal instruction,  video, and written materials to support subject matter.  Instructors  review role of stress in heart disease and how to cope with stress positively.     Exercising on Your Own:  -Group instruction provided by verbal instruction, power point, and written materials to support subject.  Instructors discuss benefits of exercise, components of exercise, frequency and intensity of exercise, and end points for exercise.  Also discuss use of nitroglycerin and activating EMS.  Review options of places to exercise outside of rehab.  Review guidelines for sex with heart disease.   Cardiac Drugs I:  -Group instruction provided by verbal instruction and written materials to support subject.  Instructor reviews cardiac drug classes: antiplatelets, anticoagulants, beta blockers, and statins.  Instructor discusses reasons, side effects, and lifestyle considerations for each drug class.   Cardiac Drugs II:  -Group instruction provided by verbal instruction and written materials to support subject.  Instructor reviews cardiac drug classes: angiotensin converting enzyme inhibitors (ACE-I), angiotensin II receptor blockers (ARBs), nitrates, and calcium channel blockers.  Instructor discusses reasons, side effects, and lifestyle considerations for each drug class.   Anatomy and Physiology of the Circulatory System:  -Group instruction provided by verbal instruction, video, and written materials to support subject.  Reviews functional anatomy of heart, how it relates to various diagnoses, and what role the heart plays in the overall system.   Knowledge Questionnaire Score:     Knowledge Questionnaire Score - 04/03/16 0844      Knowledge Questionnaire Score   Pre Score 20/24      Core Components/Risk Factors/Patient Goals at Admission:     Personal Goals and Risk Factors at Admission - 04/03/16 1621      Core Components/Risk Factors/Patient Goals on Admission    Weight Management Yes;Weight  Maintenance;Weight Loss   Intervention Weight Management: Develop a combined nutrition and exercise program designed to reach desired caloric intake, while maintaining appropriate intake of nutrient and fiber, sodium and fats, and appropriate energy expenditure required for the weight goal.;Weight Management: Provide education and appropriate resources to help participant work on and attain dietary goals.;Weight Management/Obesity: Establish reasonable short term and long term weight goals.;Obesity: Provide education and appropriate resources to help participant work on and attain dietary goals.   Expected Outcomes Short Term: Continue to assess and modify interventions until short term weight is achieved;Long Term: Adherence to nutrition and physical activity/exercise program aimed toward attainment of established weight goal;Weight Maintenance: Understanding of the daily nutrition guidelines, which includes 25-35% calories from fat, 7% or less cal from saturated fats, less than '200mg'$  cholesterol, less than 1.5gm of sodium, & 5 or more servings of fruits and vegetables daily;Weight Loss: Understanding of general recommendations for a balanced deficit meal plan, which promotes 1-2 lb weight loss per week and includes a negative energy balance of [phone] kcal/d;Understanding recommendations for meals to include 15-35% energy as protein, 25-35% energy from fat, 35-60% energy from carbohydrates, less than '200mg'$  of dietary cholesterol, 20-35 gm of total fiber daily;Understanding of distribution of calorie intake throughout the day with the consumption of 4-5 meals/snacks   Hypertension Yes   Intervention Monitor prescription use compliance.;Provide education on lifestyle modifcations including regular physical activity/exercise, weight management, moderate sodium restriction and increased consumption of fresh fruit, vegetables, and low fat dairy, alcohol moderation, and smoking cessation.   Expected Outcomes Short  Term: Continued assessment and intervention until BP is < 140/41m HG in hypertensive participants. < 130/864mHG in hypertensive participants with diabetes, heart failure or chronic kidney disease.;Long Term: Maintenance of blood pressure at goal levels.  Lipids Yes   Intervention Provide education and support for participant on nutrition & aerobic/resistive exercise along with prescribed medications to achieve LDL '70mg'$ , HDL >'40mg'$ .   Expected Outcomes Short Term: Participant states understanding of desired cholesterol values and is compliant with medications prescribed. Participant is following exercise prescription and nutrition guidelines.;Long Term: Cholesterol controlled with medications as prescribed, with individualized exercise RX and with personalized nutrition plan. Value goals: LDL < '70mg'$ , HDL > 40 mg.      Core Components/Risk Factors/Patient Goals Review:    Core Components/Risk Factors/Patient Goals at Discharge (Final Review):    ITP Comments:     ITP Comments    Row Name 04/03/16 0814           ITP Comments Medical Director- Dr. Fransico Him, MD          Comments: Eirik attended orientation from 0800 to 1000 to review rules and guidelines for program. Completed 6 minute walk test, Intitial ITP, and exercise prescription.  VSS. Telemetry-Sinus Rhythm.  Asymptomatic.Barnet Pall, RN,BSN 04/03/2016 4:26 PM

## 2016-04-03 NOTE — Progress Notes (Signed)
Cardiac Rehab Medication Review by a Pharmacist  Does the patient  feel that his/her medications are working for him/her?  yes  Has the patient been experiencing any side effects to the medications prescribed?  Yes - mild memory recall (names) issues ?attributable to statin  Does the patient measure his/her own blood pressure or blood glucose at home?  yes   Does the patient have any problems obtaining medications due to transportation or finances?   no  Understanding of regimen: excellent Understanding of indications: excellent Potential of compliance: excellent    Pharmacist comments:Patient presents in good spirits, ambulating without assistance. Patient cites memory recall issues ( cannot remember names at church) and wonders if meds could be the cause. Informed him that statins can cause memory issues and advised him to continue taking pravastatin until he has an appnt with his cardiologist where he can ask about alternatives to statins. Patient also asks about which medications can be taken off his list. Informed him that BP can be reduced through diet and exercise which could lead to a reduction in BP doses/meds. Also advised about lifestyle modifications to control acid reflux if he was interested in getting off of pantoprazole. Patient asks about BP goals, informed him of JNC8 and 2017 ACC/AHA BP goals. No issues with syncope, bradycardia, or hypotension. No longer taking telmisartan as this was unaffordable. Now on losartan- no issues with BP control. Reviewed all indications, doses, and dosing schedules with patient.     Carlean Jews, Pharm.D. PGY1 Pharmacy Resident 3/27/20189:11 AM Pager [phone]

## 2016-04-09 ENCOUNTER — Encounter (HOSPITAL_COMMUNITY)
Admission: RE | Admit: 2016-04-09 | Discharge: 2016-04-09 | Disposition: A | Discharge status: Home / Self Care | Payer: Medicare Other | Source: Ambulatory Visit | Attending: Cardiology | Admitting: Cardiology

## 2016-04-09 DIAGNOSIS — Z955 Presence of coronary angioplasty implant and graft: Secondary | ICD-10-CM | POA: Diagnosis not present

## 2016-04-09 NOTE — Progress Notes (Signed)
Daily Session Note  Patient Details  Name: Miguel Wolfe MRN: [id number] Date of Birth: January 13, 1945 Referring Provider:     CARDIAC REHAB PHASE II ORIENTATION from 04/03/2016 in [place]  Referring Provider  Candee Furbish MD      Encounter Date: 04/09/2016  Check In:     Session Check In - 04/09/16 1023      Check-In   Staff Present Cleda Mccreedy, MS, Exercise Physiologist;Molly diVincenzo, MS, ACSM RCEP, Exercise Physiologist;Siddharth Babington, RN, BSN;Amber Fair, MS, ACSM RCEP, Exercise Physiologist;Joann Rion, RN, BSN   Supervising physician immediately available to respond to emergencies [facility] immediately available   Physician(s) Dr. Doyle Askew   Medication changes reported     No   Fall or balance concerns reported    No   Tobacco Cessation No Change   Warm-up and Cool-down Performed as group-led instruction   Resistance Training Performed Yes   VAD Patient? No     Pain Assessment   Currently in Pain? No/denies   Multiple Pain Sites No      Capillary Blood Glucose: No results found for this or any previous visit (from the past 24 hour(s)).    History  Smoking Status  . Former Smoker  . Types: Cigarettes  . Quit date: 02/01/1967  Smokeless Tobacco  . Never Used    Goals Met:  No report of cardiac concerns or symptoms  Goals Unmet:  Not Applicable  Comments: Pt started cardiac rehab today.  Pt tolerated light exercise without difficulty. VSS, telemetry-Sinus Rhythm, asymptomatic.  Medication list reconciled. Pt denies barriers to medicaiton compliance.  PSYCHOSOCIAL ASSESSMENT:  PHQ-0. Pt exhibits positive coping skills, hopeful outlook with supportive family. No psychosocial needs identified at this time, no psychosocial interventions necessary.    Pt enjoys playing golf woodworking and hiking..   Pt oriented to exercise equipment and routine.    Understanding verbalized.   Dr. Fransico Him is Medical Director for Cardiac  Rehab at [facility].

## 2016-04-11 ENCOUNTER — Encounter (HOSPITAL_COMMUNITY)
Admission: RE | Admit: 2016-04-11 | Discharge: 2016-04-11 | Disposition: A | Discharge status: Home / Self Care | Payer: Medicare Other | Source: Ambulatory Visit | Attending: Cardiology | Admitting: Cardiology

## 2016-04-11 DIAGNOSIS — Z955 Presence of coronary angioplasty implant and graft: Secondary | ICD-10-CM | POA: Diagnosis not present

## 2016-04-11 NOTE — Progress Notes (Signed)
Miguel Wolfe 71 y.o. male Nutrition Note Spoke with pt. Nutrition Survey reviewed with pt. Pt is following Step 2 of the Therapeutic Lifestyle Changes diet. Pt expressed understanding of the information reviewed. Pt aware of nutrition education classes offered and plans on attending nutrition classes. No results found for: HGBA1C Wt Readings from Last 3 Encounters:  04/03/16 183 lb 3.2 oz (83.1 kg)  02/23/16 186 lb (84.4 kg)  02/20/16 185 lb (83.9 kg)   Nutrition Diagnosis ? Food-and nutrition-related knowledge deficit related to lack of exposure to information as related to diagnosis of: ? CVD ? Pre-DM (per pt) ? Overweight related to excessive energy intake as evidenced by a BMI of 25.2  Nutrition Intervention ? Benefits of adopting Therapeutic Lifestyle Changes discussed when Medficts reviewed. ? Pt to attend the Portion Distortion class ? Pt to attend the  ? Nutrition I class - met; 04/10/16                     ? Nutrition II class ? Continue client-centered nutrition education by RD, as part of interdisciplinary care.  Goal(s) ? Pt to identify food quantities necessary to achieve weight loss of 6-24 lb (2.7-10.9 kg) at graduation from cardiac rehab.   Monitor and Evaluate progress toward nutrition goal with team.  Derek Mound, M.Ed, RD, LDN, CDE 04/11/2016 11:04 AM

## 2016-04-13 ENCOUNTER — Encounter (HOSPITAL_COMMUNITY): Payer: Medicare Other

## 2016-04-13 ENCOUNTER — Encounter (HOSPITAL_COMMUNITY)
Admission: RE | Admit: 2016-04-13 | Discharge: 2016-04-13 | Disposition: A | Discharge status: Home / Self Care | Payer: Medicare Other | Source: Ambulatory Visit

## 2016-04-13 DIAGNOSIS — Z955 Presence of coronary angioplasty implant and graft: Secondary | ICD-10-CM | POA: Diagnosis not present

## 2016-04-16 ENCOUNTER — Encounter (HOSPITAL_COMMUNITY)
Admission: RE | Admit: 2016-04-16 | Discharge: 2016-04-16 | Disposition: A | Discharge status: Home / Self Care | Payer: Medicare Other | Source: Ambulatory Visit | Attending: Cardiology | Admitting: Cardiology

## 2016-04-16 DIAGNOSIS — Z955 Presence of coronary angioplasty implant and graft: Secondary | ICD-10-CM

## 2016-04-18 ENCOUNTER — Encounter (HOSPITAL_COMMUNITY)
Admission: RE | Admit: 2016-04-18 | Discharge: 2016-04-18 | Disposition: A | Discharge status: Home / Self Care | Payer: Medicare Other | Source: Ambulatory Visit | Attending: Cardiology | Admitting: Cardiology

## 2016-04-18 DIAGNOSIS — Z955 Presence of coronary angioplasty implant and graft: Secondary | ICD-10-CM

## 2016-04-19 DIAGNOSIS — D3132 Benign neoplasm of left choroid: Secondary | ICD-10-CM | POA: Diagnosis not present

## 2016-04-19 DIAGNOSIS — H211X1 Other vascular disorders of iris and ciliary body, right eye: Secondary | ICD-10-CM | POA: Diagnosis not present

## 2016-04-19 DIAGNOSIS — H43811 Vitreous degeneration, right eye: Secondary | ICD-10-CM | POA: Diagnosis not present

## 2016-04-19 DIAGNOSIS — H35371 Puckering of macula, right eye: Secondary | ICD-10-CM | POA: Diagnosis not present

## 2016-04-20 ENCOUNTER — Encounter (HOSPITAL_COMMUNITY): Payer: Medicare Other

## 2016-04-20 ENCOUNTER — Telehealth (HOSPITAL_COMMUNITY): Payer: Self-pay | Admitting: Family Medicine

## 2016-04-20 NOTE — Progress Notes (Signed)
Reviewed home exercise program with pt.  Discussed THRR, RPE scale, mode/frequency of exercise and weather conditions for exercising outdoors.  Also discussed signs and symptoms and when to call Dr./911.  Pt verbalized understanding.   Cleda Mccreedy 04/20/2016 16:30

## 2016-04-23 ENCOUNTER — Encounter (HOSPITAL_COMMUNITY)
Admission: RE | Admit: 2016-04-23 | Discharge: 2016-04-23 | Disposition: A | Discharge status: Home / Self Care | Payer: Medicare Other | Source: Ambulatory Visit | Attending: Cardiology | Admitting: Cardiology

## 2016-04-23 ENCOUNTER — Encounter: Payer: Self-pay | Admitting: Physician Assistant

## 2016-04-23 ENCOUNTER — Telehealth: Payer: Self-pay | Admitting: Student

## 2016-04-23 DIAGNOSIS — Z955 Presence of coronary angioplasty implant and graft: Secondary | ICD-10-CM | POA: Diagnosis not present

## 2016-04-23 DIAGNOSIS — I2510 Atherosclerotic heart disease of native coronary artery without angina pectoris: Secondary | ICD-10-CM

## 2016-04-23 DIAGNOSIS — E785 Hyperlipidemia, unspecified: Secondary | ICD-10-CM | POA: Insufficient documentation

## 2016-04-23 DIAGNOSIS — K219 Gastro-esophageal reflux disease without esophagitis: Secondary | ICD-10-CM | POA: Insufficient documentation

## 2016-04-23 HISTORY — DX: Atherosclerotic heart disease of native coronary artery without angina pectoris: I25.10

## 2016-04-23 NOTE — Progress Notes (Signed)
Miguel Wolfe  reports feeling lightheaded since Friday and reports that he feels he hs not fully gained his strength back since his stenting back in February. Miguel Wolfe says he played golf on Saturday, 9 holes and has been completing his daily ADL's without difficulty. Miguel Wolfe. Miguel Wolfe has a history of hemorrhoids. Orthoscopic blood pressures checked. Lying blood pressure 142/70. Miguel Wolfe. Sitting blood pressure 140/80 heart rate of 72. Standing blood pressure 132/70 , heart rate 71. Miguel Pho PA called and notified. Miguel Wolfe said that Miguel Wolfe can exercise today since his vital signs are stable. Miguel Wolfe made an appointment to see Miguel Dopp PA tomorrow at [phone]. Miguel Wolfe exercised without difficulty today. Vital signs remained stable. Patient given a copy of his exercise flow sheets to take to Miguel Dopp PA for review tomorrow.Miguel Pall, RN,BSN 04/23/2016 1:43 PM

## 2016-04-23 NOTE — Progress Notes (Signed)
Cardiology Office Note:    Date:  04/24/2016   ID:  ZAHMIR LALLA, DOB 07/29/1945, MRN [id number]  PCP:  Gennette Pac, MD  Cardiologist:  Dr. Candee Furbish   Electrophysiologist:  n/a  Referring MD: [name], MD   Chief Complaint  Patient presents with  . Dizziness    History of Present Illness:    HOLDAN STUCKE is a 71 y.o. male with a hx of CAD s/p DES to RCA in 2/18, HTN, HL, GERD.  Last seen in clinic by Angelena Form, PA-C in 2/18.  He was having more issues with acid reflux.  His Prilosec had been changed to Protonix at the time of his PCI due to interaction of Prilosec with Plavix.  Katie changed his Plavix to Effient and put him back on Prilosec.    He reported dizziness at cardiac rehabilitation yesterday and was set up for an appointment for evaluation.  He is here today with his wife. He has noted mainly lightheadedness the past several days. He denies syncope or near syncope. He has symptoms while seated but they do tend to worsen with standing. He has random discomfort in his chest described as pressure. This is nonexertional and is not associated with any other symptoms. His main complaint was decreased energy/exercise intolerance prior to his PCI (not chest pain). He has not noted any change in his energy level since his PCI. He does have a history of hemorrhoids. Over the past few days he has noted hemorrhoidal bleeding with bright red blood per rectum. He did have a quite significant amount yesterday. He tells me he had a colonoscopy about a year ago that did not demonstrate any significant findings. He denies melena or hematemesis. He does have difficulty sleeping. He denies fevers or chills. He has been on Zyrtec for the last month for allergies. He has been on Propranolol for years that was mainly started for anxiety but continued for hypertension.  Prior CV studies:   The following studies were reviewed today:  LHC 02/20/16 LAD dist 20, D1 ostial 20 LCx  prox 20  RCA prox 80, mid 20, dist 30; AM 90 PCI: 4 x 24 mm Synergy DES to proximal RCA 1. Severe single vessel CAD (severe stenosis mid RCA) 2. Successful PTCA/DES x 1 mid RCA  3. Mild non-obstructive disease Circumflex and LAD Can consider holding Plavix at 3 mos (ideally 6 mos) for toe surgery        Myoview 02/08/16 EF 51, inf-lat, apical lateral scar with peri-infarct ischemia; intermediate risk  Echo 02/08/16 Mild LVH, EF 55-60, no RWMA, Gr 1 DD, trivial AI, mild LAE  Past Medical History:  Diagnosis Date  . Colon polyps   . Coronary artery disease involving native coronary artery of native heart without angina pectoris 04/23/2016   Myoview 02/08/16: EF 51, inf-lat, apical lateral scar with peri-infarct ischemia; intermediate risk  // LHC 2/18: dLAD 20, oD1 20, pLCx 20, pRCA 80, mRCA 20, dRCA 30, AM 90 >> PCI: 4 x 24 mm Synergy DES to pRCA   . Fatigue   . GERD (gastroesophageal reflux disease)   . Hemorrhoids   . History of echocardiogram    Echo 02/08/16: Mild LVH, EF 55-60, no RWMA, Gr 1 DD, trivial AI, mild LAE  . Hyperlipidemia   . Hypertension     Past Surgical History:  Procedure Laterality Date  . CARDIAC CATHETERIZATION    . COLONOSCOPY WITH ESOPHAGOGASTRODUODENOSCOPY (EGD)  2008  . CORONARY STENT INTERVENTION  N/A 02/20/2016   Procedure: Coronary Stent Intervention;  Surgeon: Burnell Blanks, MD;  Location: [facility];  Service: Cardiovascular;  Laterality: N/A;  . LEFT HEART CATH AND CORONARY ANGIOGRAPHY N/A 02/20/2016   Procedure: Left Heart Cath and Coronary Angiography;  Surgeon: Burnell Blanks, MD;  Location: [facility];  Service: Cardiovascular;  Laterality: N/A;  . PILONIDAL CYCT    . REFRACTIVE SURGERY      Current Medications: Current Meds  Medication Sig  . amLODipine (NORVASC) 10 MG tablet Take 10 mg by mouth daily.  Marland Kitchen aspirin EC 81 MG tablet Take 81 mg by mouth every evening.   . calcium carbonate (TUMS - DOSED IN MG  ELEMENTAL CALCIUM) 500 MG chewable tablet Chew 2 tablets by mouth daily as needed for indigestion or heartburn.  . cetirizine (ZYRTEC) 10 MG tablet Take 10 mg by mouth daily.  . clopidogrel (PLAVIX) 75 MG tablet Take 1 tablet (75 mg total) by mouth daily.  Marland Kitchen ibuprofen (ADVIL,MOTRIN) 200 MG tablet Take 200 mg by mouth 2 (two) times daily as needed for headache or moderate pain.  Marland Kitchen losartan (COZAAR) 100 MG tablet Take 100 mg by mouth daily.  . nitroGLYCERIN (NITROSTAT) 0.4 MG SL tablet Place 1 tablet (0.4 mg total) under the tongue every 5 (five) minutes as needed for chest pain.  Marland Kitchen omega-3 acid ethyl esters (LOVAZA) 1 g capsule Take 2 g by mouth 2 (two) times daily.  . pantoprazole (PROTONIX) 40 MG tablet Take 1 tablet (40 mg total) by mouth daily.  . pravastatin (PRAVACHOL) 40 MG tablet Take 40 mg by mouth every evening.   . propranolol (INDERAL) 20 MG tablet Take 20 mg by mouth daily.  Marland Kitchen zolpidem (AMBIEN) 10 MG tablet Take 3 mg by mouth at bedtime as needed for sleep.      Allergies:   Gemfibrozil; Niaspan [niacin er]; and Simvastatin   Social History   Social History  . Marital status: Married    Spouse name: N/A  . Number of children: N/A  . Years of education: N/A   Social History Main Topics  . Smoking status: Former Smoker    Types: Cigarettes    Quit date: 02/01/1967  . Smokeless tobacco: Never Used  . Alcohol use Yes     Comment: a drink before dinner a few times a week  . Drug use: No  . Sexual activity: Not Asked   Other Topics Concern  . None   Social History Narrative  . None     Family History  Problem Relation Age of Onset  . Heart attack Father 38    BYPASS  . Crohn's disease Sister   . Colon cancer Paternal Uncle   . Healthy Sister      ROS:   Please see the history of present illness.    Review of Systems  Gastrointestinal: Positive for hematochezia.  Neurological: Positive for dizziness.   All other systems reviewed and are  negative.   EKGs/Labs/Other Test Reviewed:    EKG:  EKG is  ordered today.  The ekg ordered today demonstrates NSR, HR 60, LAD, nonspecific ST-T wave changes, QTc 410 ms, no change from prior tracing  Recent Labs: 04/24/2016: ALT 21; BUN 17; Creatinine, Ser 0.85; Platelets 162; Potassium 4.1; Sodium 143   Recent Lipid Panel No results found for: CHOL, TRIG, HDL, CHOLHDL, VLDL, LDLCALC, LDLDIRECT   Physical Exam:    VS:  Ht '5\' 11"'$  (1.803 m)   Wt  183 lb 1.9 oz (83.1 kg)   BMI 25.54 kg/m     Orthostatic VS for the past 24 hrs (Last 3 readings):  BP- Lying Pulse- Lying BP- Sitting Pulse- Sitting BP- Standing at 0 minutes Pulse- Standing at 0 minutes BP- Standing at 3 minutes Pulse- Standing at 3 minutes  04/24/16 0852 138/74 60 151/74 62 145/75 66 131/73 64   Wt Readings from Last 3 Encounters:  04/24/16 183 lb 1.9 oz (83.1 kg)  04/03/16 183 lb 3.2 oz (83.1 kg)  02/23/16 186 lb (84.4 kg)     Physical Exam  Constitutional: He is oriented to person, place, and time. He appears well-developed and well-nourished. No distress.  HENT:  Head: Normocephalic and atraumatic.  Eyes: Conjunctivae are normal. No scleral icterus.  Neck: Normal range of motion. No JVD present.  Cardiovascular: Normal rate, regular rhythm, S1 normal and S2 normal.   No murmur heard. Pulmonary/Chest: Effort normal and breath sounds normal. He has no wheezes. He has no rhonchi. He has no rales.  Abdominal: Soft. There is no tenderness.  Musculoskeletal: He exhibits no edema.  Neurological: He is alert and oriented to person, place, and time.  Skin: Skin is warm and dry.  Psychiatric: He has a normal mood and affect.    ASSESSMENT:    1. Dizziness   2. Coronary artery disease involving native coronary artery of native heart without angina pectoris   3. Rectal bleeding   4. Essential hypertension   5. Hyperlipidemia, unspecified hyperlipidemia type    PLAN:    In order of problems listed above:  1.  Dizziness -  He mainly complains of lightheadedness. His main complaint prior to his PCI was decreased energy level. He did not have any change in his energy level after stenting of his RCA. He really did not have significant disease in the left system. He has not had exertional chest pain. He actually feels better with exercise. He denies syncope or near syncope. His ECG is unchanged. I do not believe that he needs ischemic testing. TSH in January was normal. Given his rectal bleeding, I am concerned about anemia. Question if medications are also playing a role.  Also, question sinus congestion.    -  Orthostatic vital signs: no significant BP drop or HR increase with standing today  -  Labs today: CMET, CBC  -  Change propranolol to every PM   -  Change Zyrtec to Claritin   -  Trial of Flonase  2. Coronary artery disease involving native coronary artery of native heart without angina pectoris - He does not appear to be having any anginal symptoms. ECG is unchanged. He decided to remain on Plavix as his indigestion did improve. Continue aspirin, Plavix, statin.  Continue cardiac rehabilitation.   3. Rectal bleeding - This seems to be hemorrhoidal bleeding. He did have a colonoscopy in the recent past. His gastroenterologist is Dr. Michail Sermon. I have asked him to follow-up with him to address this further as Mr. Skelly does need to remain on dual antiplatelet therapy for a minimum of 12 months post PCI.  4. Essential hypertension - Question if beta-blocker is making him feel this way.  This is doubtful as he has been on this for years.  Will change Propranolol to QPM to see if this helps.   5. Hyperlipidemia, unspecified hyperlipidemia type -  Continue statin.    Dispo:  Return in about 4 weeks (around 05/22/2016) for Close Follow Up with Dr. Marlou Porch.  Medication Adjustments/Labs and Tests Ordered: Current medicines are reviewed at length with the patient today.  Concerns regarding medicines are  outlined above.  Medication changes, Labs and Tests ordered today are outlined in the Patient Instructions noted below. Patient Instructions  Medication Instructions:  1. Change the time that you take Propranolol - Try taking it in the evening (after dinner time). 2. Stop Zyrtec (Cetirizine) 3. Try Claritin (Loratadine) 10 mg Once daily (over the counter) for your allergies. 4. Try Flonase (over the counter) for ~ 1 month  Labwork: Today - CMET, CBC   Testing/Procedures: None   Follow-Up: 1. Dr. Candee Furbish 05/22/16 @ 9 AM   2. Please call Dr. Kathline Magic office for follow up on your bleeding hemorrhoids.   Any Other Special Instructions Will Be Listed Below (If Applicable). You can continue with cardiac rehabilitation.  If you need a refill on your cardiac medications before your next appointment, please call your pharmacy.   Signed, Richardson Dopp, PA-C  04/24/2016 4:52 PM    [facility] [address], [place], [place]  [address] Phone: [phone]; Fax: [phone]

## 2016-04-23 NOTE — Telephone Encounter (Signed)
Contacted by Verdis Frederickson with Cardiac Rehab that the patient has been having intermittent dizziness for the past 3 days. Denies any associated chest pain, dyspnea on exertion, palpitations, or pre-syncope.   Initial BP at 142/80. Orthostatic Vitals checked while at Cardiac Rehab and normal. He did report to Verdis Frederickson having an episode of hematochezia this AM but believes this is secondary to his known hemorrhoids. No melena or hematochezia over the past few weeks. I recommended he have a CBC drawn at either our office or his PCP as he is on DAPT with ASA and Plavix (office note from 02/23/2016 mentions being switched from Plavix to Effient but he was unable to afford this and therefore remained on Plavix). He wishes to follow-up with Cardiology in regards to his symptoms, as he also reports "just not feeling well" since his intervention in 02/2016.  Have arranged for close follow-up on 04/24/2016 at 0815 with Richardson Dopp, PA-C. Patient aware of appointment time. Will need CBC checked at that time.   Signed, Erma Heritage, PA-C 04/23/2016, 10:29 AM

## 2016-04-24 ENCOUNTER — Telehealth: Payer: Self-pay | Admitting: *Deleted

## 2016-04-24 ENCOUNTER — Ambulatory Visit (INDEPENDENT_AMBULATORY_CARE_PROVIDER_SITE_OTHER): Payer: Medicare Other | Admitting: Physician Assistant

## 2016-04-24 ENCOUNTER — Encounter: Payer: Self-pay | Admitting: Physician Assistant

## 2016-04-24 VITALS — Ht 71.0 in | Wt 183.1 lb

## 2016-04-24 DIAGNOSIS — I10 Essential (primary) hypertension: Secondary | ICD-10-CM

## 2016-04-24 DIAGNOSIS — R42 Dizziness and giddiness: Secondary | ICD-10-CM | POA: Diagnosis not present

## 2016-04-24 DIAGNOSIS — I2510 Atherosclerotic heart disease of native coronary artery without angina pectoris: Secondary | ICD-10-CM | POA: Diagnosis not present

## 2016-04-24 DIAGNOSIS — E785 Hyperlipidemia, unspecified: Secondary | ICD-10-CM | POA: Diagnosis not present

## 2016-04-24 DIAGNOSIS — K625 Hemorrhage of anus and rectum: Secondary | ICD-10-CM

## 2016-04-24 LAB — COMPREHENSIVE METABOLIC PANEL
ALBUMIN: 4.8 g/dL (ref 3.6–4.8)
ALK PHOS: 88 IU/L (ref 39–117)
ALT: 21 IU/L (ref 0–44)
AST: 26 IU/L (ref 0–40)
Albumin/Globulin Ratio: 2.4 — ABNORMAL HIGH (ref 1.2–2.2)
BUN / CREAT RATIO: 20 (ref 10–24)
BUN: 17 mg/dL (ref 8–27)
Bilirubin Total: 0.7 mg/dL (ref 0.0–1.2)
CALCIUM: 9.3 mg/dL (ref 8.6–10.2)
CO2: 24 mmol/L (ref 18–29)
CREATININE: 0.85 mg/dL (ref 0.76–1.27)
Chloride: 102 mmol/L (ref 96–106)
GFR calc Af Amer: 103 mL/min/{1.73_m2} (ref >59)
GFR, EST NON AFRICAN AMERICAN: 90 mL/min/{1.73_m2} (ref >59)
GLOBULIN, TOTAL: 2 g/dL (ref 1.5–4.5)
GLUCOSE: 127 mg/dL — AB (ref 65–99)
Potassium: 4.1 mmol/L (ref 3.5–5.2)
SODIUM: 143 mmol/L (ref 134–144)
Total Protein: 6.8 g/dL (ref 6.0–8.5)

## 2016-04-24 LAB — CBC
HEMATOCRIT: 44.4 % (ref 37.5–51.0)
HEMOGLOBIN: 15.2 g/dL (ref 13.0–17.7)
MCH: 30.2 pg (ref 26.6–33.0)
MCHC: 34.2 g/dL (ref 31.5–35.7)
MCV: 88 fL (ref 79–97)
Platelets: 162 10*3/uL (ref 150–379)
RBC: 5.03 x10E6/uL (ref 4.14–5.80)
RDW: 13.3 % (ref 12.3–15.4)
WBC: 4.5 10*3/uL (ref 3.4–10.8)

## 2016-04-24 NOTE — Telephone Encounter (Signed)
Pt notified of lab results and findings by phone with verbal understanding to results given today. Pt advised to f/u with PCP in regards to elevated glucose level. We discussed about limiting simple carbohydrates, which pt states he is doing, in fact tries to eat more complex carbs (whole grains). Pt is agreeable to plan of care. I will fax results to [name] at [facility]. Pt thanked me for my time.

## 2016-04-24 NOTE — Telephone Encounter (Signed)
-----   Message from Liliane Shi, [place] sent at 04/24/2016  4:59 PM EDT ----- Please call the patient The hemoglobin is normal. Kidney function and liver enzymes are normal. The glucose (sugar) is a little high. Continue with current treatment plan. Recommend he follow up with his PCP for his elevated sugar.  Richardson Dopp, PA-C   04/24/2016 4:58 PM

## 2016-04-24 NOTE — Patient Instructions (Addendum)
Medication Instructions:  1. Change the time that you take Propranolol - Try taking it in the evening (after dinner time). 2. Stop Zyrtec (Cetirizine) 3. Try Claritin (Loratadine) 10 mg Once daily (over the counter) for your allergies. 4. Try Flonase (over the counter) for ~ 1 month  Labwork: Today - CMET, CBC   Testing/Procedures: None   Follow-Up: 1. Dr. Candee Furbish 05/22/16 @ 9 AM   2. Please call Dr. Kathline Magic office for follow up on your bleeding hemorrhoids.   Any Other Special Instructions Will Be Listed Below (If Applicable). You can continue with cardiac rehabilitation.  If you need a refill on your cardiac medications before your next appointment, please call your pharmacy.

## 2016-04-25 ENCOUNTER — Encounter (HOSPITAL_COMMUNITY)
Admission: RE | Admit: 2016-04-25 | Discharge: 2016-04-25 | Disposition: A | Discharge status: Home / Self Care | Payer: Medicare Other | Source: Ambulatory Visit | Attending: Cardiology | Admitting: Cardiology

## 2016-04-25 DIAGNOSIS — Z955 Presence of coronary angioplasty implant and graft: Secondary | ICD-10-CM

## 2016-04-27 ENCOUNTER — Encounter (HOSPITAL_COMMUNITY)
Admission: RE | Admit: 2016-04-27 | Discharge: 2016-04-27 | Disposition: A | Discharge status: Home / Self Care | Payer: Medicare Other | Source: Ambulatory Visit | Attending: Cardiology | Admitting: Cardiology

## 2016-04-27 DIAGNOSIS — Z955 Presence of coronary angioplasty implant and graft: Secondary | ICD-10-CM

## 2016-04-30 ENCOUNTER — Encounter (HOSPITAL_COMMUNITY)
Admission: RE | Admit: 2016-04-30 | Discharge: 2016-04-30 | Disposition: A | Discharge status: Home / Self Care | Payer: Medicare Other | Source: Ambulatory Visit | Attending: Cardiology | Admitting: Cardiology

## 2016-04-30 DIAGNOSIS — Z955 Presence of coronary angioplasty implant and graft: Secondary | ICD-10-CM | POA: Diagnosis not present

## 2016-05-02 ENCOUNTER — Encounter (HOSPITAL_COMMUNITY)
Admission: RE | Admit: 2016-05-02 | Discharge: 2016-05-02 | Disposition: A | Discharge status: Home / Self Care | Payer: Medicare Other | Source: Ambulatory Visit | Attending: Cardiology | Admitting: Cardiology

## 2016-05-02 DIAGNOSIS — Z955 Presence of coronary angioplasty implant and graft: Secondary | ICD-10-CM | POA: Diagnosis not present

## 2016-05-02 NOTE — Progress Notes (Signed)
Cardiac Individual Treatment Plan  Patient Details  Name: Miguel Wolfe MRN: [id number] Date of Birth: 16-Jan-1945 Referring Provider:     CARDIAC REHAB PHASE II ORIENTATION from 04/03/2016 in [place]  Referring Provider  Candee Furbish MD      Initial Encounter Date:    CARDIAC REHAB PHASE II ORIENTATION from 04/03/2016 in [place]  Date  04/03/16  Referring Provider  Candee Furbish MD      Visit Diagnosis: 02/20/16 Status post coronary artery stent placement  Patient's Home Medications on Admission:  Current Outpatient Prescriptions:  .  amLODipine (NORVASC) 10 MG tablet, Take 10 mg by mouth daily., Disp: , Rfl:  .  aspirin EC 81 MG tablet, Take 81 mg by mouth every evening. , Disp: , Rfl:  .  calcium carbonate (TUMS - DOSED IN MG ELEMENTAL CALCIUM) 500 MG chewable tablet, Chew 2 tablets by mouth daily as needed for indigestion or heartburn., Disp: , Rfl:  .  cetirizine (ZYRTEC) 10 MG tablet, Take 10 mg by mouth daily., Disp: , Rfl:  .  clopidogrel (PLAVIX) 75 MG tablet, Take 1 tablet (75 mg total) by mouth daily., Disp: 90 tablet, Rfl: 3 .  ibuprofen (ADVIL,MOTRIN) 200 MG tablet, Take 200 mg by mouth 2 (two) times daily as needed for headache or moderate pain., Disp: , Rfl:  .  losartan (COZAAR) 100 MG tablet, Take 100 mg by mouth daily., Disp: , Rfl: 0 .  nitroGLYCERIN (NITROSTAT) 0.4 MG SL tablet, Place 1 tablet (0.4 mg total) under the tongue every 5 (five) minutes as needed for chest pain., Disp: 25 tablet, Rfl: 12 .  omega-3 acid ethyl esters (LOVAZA) 1 g capsule, Take 2 g by mouth 2 (two) times daily., Disp: , Rfl:  .  pantoprazole (PROTONIX) 40 MG tablet, Take 1 tablet (40 mg total) by mouth daily., Disp: 30 tablet, Rfl: 11 .  pravastatin (PRAVACHOL) 40 MG tablet, Take 40 mg by mouth every evening. , Disp: , Rfl:  .  propranolol (INDERAL) 20 MG tablet, Take 20 mg by mouth daily., Disp: , Rfl: 2 .  zolpidem  (AMBIEN) 10 MG tablet, Take 3 mg by mouth at bedtime as needed for sleep. , Disp: , Rfl:   Past Medical History: Past Medical History:  Diagnosis Date  . Colon polyps   . Coronary artery disease involving native coronary artery of native heart without angina pectoris 04/23/2016   Myoview 02/08/16: EF 51, inf-lat, apical lateral scar with peri-infarct ischemia; intermediate risk  // LHC 2/18: dLAD 20, oD1 20, pLCx 20, pRCA 80, mRCA 20, dRCA 30, AM 90 >> PCI: 4 x 24 mm Synergy DES to pRCA   . Fatigue   . GERD (gastroesophageal reflux disease)   . Hemorrhoids   . History of echocardiogram    Echo 02/08/16: Mild LVH, EF 55-60, no RWMA, Gr 1 DD, trivial AI, mild LAE  . Hyperlipidemia   . Hypertension     Tobacco Use: History  Smoking Status  . Former Smoker  . Types: Cigarettes  . Quit date: 02/01/1967  Smokeless Tobacco  . Never Used    Labs: Recent Review Flowsheet Data    There is no flowsheet data to display.      Capillary Blood Glucose: No results found for: GLUCAP   Exercise Target Goals:    Exercise Program Goal: Individual exercise prescription set with THRR, safety & activity barriers. Participant demonstrates ability to understand and report RPE  using BORG scale, to self-measure pulse accurately, and to acknowledge the importance of the exercise prescription.  Exercise Prescription Goal: Starting with aerobic activity 30 plus minutes a day, 3 days per week for initial exercise prescription. Provide home exercise prescription and guidelines that participant acknowledges understanding prior to discharge.  Activity Barriers & Risk Stratification:     Activity Barriers & Cardiac Risk Stratification - 04/03/16 1046      Activity Barriers & Cardiac Risk Stratification   Activity Barriers None   Cardiac Risk Stratification High      6 Minute Walk:     6 Minute Walk    Row Name 04/03/16 0935         6 Minute Walk   Phase Initial     Distance 1775 feet      Walk Time 6 minutes     # of Rest Breaks 0     MPH 3.36     METS 3.95     RPE 11     VO2 Peak 13.83     Symptoms No     Resting HR 70 bpm     Resting BP 133/74     Max Ex. HR 88 bpm     Max Ex. BP 132/62     2 Minute Post BP 118/64        Oxygen Initial Assessment:   Oxygen Re-Evaluation:   Oxygen Discharge (Final Oxygen Re-Evaluation):   Initial Exercise Prescription:     Initial Exercise Prescription - 04/03/16 0900      Date of Initial Exercise RX and Referring Provider   Date 04/03/16   Referring Provider Candee Furbish MD     Treadmill   MPH 2.5   Grade 2   Minutes 10   METs 3.6     Bike   Level 1.3   Minutes 10   METs 3.98     NuStep   Level 3   SPM 70   Minutes 10   METs 3     Prescription Details   Frequency (times per week) 3   Duration Progress to 30 minutes of continuous aerobic without signs/symptoms of physical distress     Intensity   THRR 40-80% of Max Heartrate 61-122   Ratings of Perceived Exertion 11-13   Perceived Dyspnea 0-4     Progression   Progression Continue to progress workloads to maintain intensity without signs/symptoms of physical distress.     Resistance Training   Training Prescription Yes   Weight 3lbs   Reps 10-15      Perform Capillary Blood Glucose checks as needed.  Exercise Prescription Changes:     Exercise Prescription Changes    Row Name 04/26/16 1600             Response to Exercise   Blood Pressure (Admit) 128/70       Blood Pressure (Exercise) 128/78       Blood Pressure (Exit) 120/68       Heart Rate (Admit) 70 bpm       Heart Rate (Exercise) 99 bpm       Heart Rate (Exit) 70 bpm       Rating of Perceived Exertion (Exercise) 12       Duration Continue with 45 min of aerobic exercise without signs/symptoms of physical distress.       Intensity THRR unchanged         Progression   Progression Continue to progress workloads to maintain intensity without  signs/symptoms of physical  distress.       Average METs 4.4         Resistance Training   Training Prescription Yes       Weight 3lbs       Reps 10-15         Treadmill   MPH 3       Grade 4       Minutes 10       METs 4.95         Bike   Level 1.3       Minutes 10       METs 3.98         NuStep   Level 4       SPM 70       Minutes 10       METs 3.5         Home Exercise Plan   Plans to continue exercise at Home (comment)       Frequency Add 4 additional days to program exercise sessions.       Initial Home Exercises Provided 04/16/16          Exercise Comments:     Exercise Comments    Row Name 04/26/16 1630 05/02/16 1714         Exercise Comments Reviewed METs and goals with pt.  He is doing well with exercise.  Received clearance for patient to begin HIIT. Oriented patient to HIIT protocal.         Exercise Goals and Review:     Exercise Goals    Row Name 04/03/16 1050             Exercise Goals   Increase Physical Activity Yes       Intervention Provide advice, education, support and counseling about physical activity/exercise needs.;Develop an individualized exercise prescription for aerobic and resistive training based on initial evaluation findings, risk stratification, comorbidities and participant's personal goals.       Expected Outcomes Achievement of increased cardiorespiratory fitness and enhanced flexibility, muscular endurance and strength shown through measurements of functional capacity and personal statement of participant.       Increase Strength and Stamina Yes  have more energy in the afternoon and over the long term be able to hike 6-7 miles in the [employer] Provide advice, education, support and counseling about physical activity/exercise needs.;Develop an individualized exercise prescription for aerobic and resistive training based on initial evaluation findings, risk stratification, comorbidities and participant's personal goals.        Expected Outcomes Achievement of increased cardiorespiratory fitness and enhanced flexibility, muscular endurance and strength shown through measurements of functional capacity and personal statement of participant.          Exercise Goals Re-Evaluation :     Exercise Goals Re-Evaluation    Row Name 04/26/16 1628             Exercise Goal Re-Evaluation   Exercise Goals Review Increase Physical Activity;Increase Strenth and Stamina       Comments Pt states he feels like he's still not where he wants to be, however, he does feel stronger and has more stamina than he did compared to when he first started the program.        Expected Outcomes Continue with CR exercise prescription and HEP in order to increase strength and stamina           Discharge Exercise  Prescription (Final Exercise Prescription Changes):     Exercise Prescription Changes - 04/26/16 1600      Response to Exercise   Blood Pressure (Admit) 128/70   Blood Pressure (Exercise) 128/78   Blood Pressure (Exit) 120/68   Heart Rate (Admit) 70 bpm   Heart Rate (Exercise) 99 bpm   Heart Rate (Exit) 70 bpm   Rating of Perceived Exertion (Exercise) 12   Duration Continue with 45 min of aerobic exercise without signs/symptoms of physical distress.   Intensity THRR unchanged     Progression   Progression Continue to progress workloads to maintain intensity without signs/symptoms of physical distress.   Average METs 4.4     Resistance Training   Training Prescription Yes   Weight 3lbs   Reps 10-15     Treadmill   MPH 3   Grade 4   Minutes 10   METs 4.95     Bike   Level 1.3   Minutes 10   METs 3.98     NuStep   Level 4   SPM 70   Minutes 10   METs 3.5     Home Exercise Plan   Plans to continue exercise at Home (comment)   Frequency Add 4 additional days to program exercise sessions.   Initial Home Exercises Provided 04/16/16      Nutrition:  Target Goals: Understanding of nutrition guidelines,  daily intake of sodium '1500mg'$ , cholesterol '200mg'$ , calories 30% from fat and 7% or less from saturated fats, daily to have 5 or more servings of fruits and vegetables.  Biometrics:     Pre Biometrics - 04/03/16 0936      Pre Biometrics   Height 5' 11.5" (1.816 m)   Weight 183 lb 3.2 oz (83.1 kg)   Waist Circumference 39 inches   Hip Circumference 39 inches   Waist to Hip Ratio 1 %   BMI (Calculated) 25.2   Triceps Skinfold 19 mm   % Body Fat 26.8 %   Grip Strength 43 kg   Flexibility 0 in   Single Leg Stand 30 seconds       Nutrition Therapy Plan and Nutrition Goals:     Nutrition Therapy & Goals - 04/04/16 1123      Nutrition Therapy   Diet Therapeutic Lifestyle Changes     Personal Nutrition Goals   Nutrition Goal Wt loss of 1-2 lb/week to a wt loss goal of 6-8 lb at graduation from [facility], educate and counsel regarding individualized specific dietary modifications aiming towards targeted core components such as weight, hypertension, lipid management, diabetes, heart failure and other comorbidities.   Expected Outcomes Short Term Goal: Understand basic principles of dietary content, such as calories, fat, sodium, cholesterol and nutrients.;Long Term Goal: Adherence to prescribed nutrition plan.      Nutrition Discharge: Nutrition Scores:     Nutrition Assessments - 04/04/16 1123      MEDFICTS Scores   Pre Score 30      Nutrition Goals Re-Evaluation:   Nutrition Goals Re-Evaluation:   Nutrition Goals Discharge (Final Nutrition Goals Re-Evaluation):   Psychosocial: Target Goals: Acknowledge presence or absence of significant depression and/or stress, maximize coping skills, provide positive support system. Participant is able to verbalize types and ability to use techniques and skills needed for reducing stress and depression.  Initial Review & Psychosocial Screening:     Initial Psych Review &  Screening - 04/03/16 1616  Initial Review   Current issues with None Identified     Family Dynamics   Good Support System? Yes     Barriers   Psychosocial barriers to participate in program There are no identifiable barriers or psychosocial needs.     Screening Interventions   Interventions Encouraged to exercise      Quality of Life Scores:     Quality of Life - 04/03/16 0844      Quality of Life Scores   Health/Function Pre 25.96 %   Socioeconomic Pre 25.14 %   Psych/Spiritual Pre 28.75 %   Family Pre 27.6 %   GLOBAL Pre 26.56 %      PHQ-9: Recent Review Flowsheet Data    Depression screen [facility] 2/9 04/09/2016   Decreased Interest 0   Down, Depressed, Hopeless 0   PHQ - 2 Score 0     Interpretation of Total Score  Total Score Depression Severity:  1-4 = Minimal depression, 5-9 = Mild depression, 10-14 = Moderate depression, 15-19 = Moderately severe depression, 20-27 = Severe depression   Psychosocial Evaluation and Intervention:   Psychosocial Re-Evaluation:     Psychosocial Re-Evaluation    [place] Name 05/02/16 1820             Psychosocial Re-Evaluation   Current issues with None Identified       Interventions Encouraged to attend Cardiac Rehabilitation for the exercise       Continue Psychosocial Services  No Follow up required          Psychosocial Discharge (Final Psychosocial Re-Evaluation):     Psychosocial Re-Evaluation - 05/02/16 1820      Psychosocial Re-Evaluation   Current issues with None Identified   Interventions Encouraged to attend Cardiac Rehabilitation for the exercise   Continue Psychosocial Services  No Follow up required      Vocational Rehabilitation: Provide vocational rehab assistance to qualifying candidates.   Vocational Rehab Evaluation & Intervention:     Vocational Rehab - 04/03/16 1614      Initial Vocational Rehab Evaluation & Intervention   Assessment shows need for Vocational Rehabilitation No  Mr  Chisolm is retired and does not need vocational rehab a tthis time.      Education: Education Goals: Education classes will be provided on a weekly basis, covering required topics. Participant will state understanding/return demonstration of topics presented.  Learning Barriers/Preferences:     Learning Barriers/Preferences - 04/03/16 0836      Learning Barriers/Preferences   Learning Barriers Sight   Learning Preferences Written Material;Verbal Instruction;Skilled Demonstration;Video;Pictoral      Education Topics: Count Your Pulse:  -Group instruction provided by verbal instruction, demonstration, patient participation and written materials to support subject.  Instructors address importance of being able to find your pulse and how to count your pulse when at home without a heart monitor.  Patients get hands on experience counting their pulse with staff help and individually.   Heart Attack, Angina, and Risk Factor Modification:  -Group instruction provided by verbal instruction, video, and written materials to support subject.  Instructors address signs and symptoms of angina and heart attacks.    Also discuss risk factors for heart disease and how to make changes to improve heart health risk factors.   Functional Fitness:  -Group instruction provided by verbal instruction, demonstration, patient participation, and written materials to support subject.  Instructors address safety measures for doing things around the house.  Discuss how to get up and down off the floor,  how to pick things up properly, how to safely get out of a chair without assistance, and balance training.   CARDIAC REHAB PHASE II EXERCISE from 04/27/2016 in [place]  Date  04/27/16  Instruction Review Code  2- meets goals/outcomes      Meditation and Mindfulness:  -Group instruction provided by verbal instruction, patient participation, and written materials to support subject.   Instructor addresses importance of mindfulness and meditation practice to help reduce stress and improve awareness.  Instructor also leads participants through a meditation exercise.    CARDIAC REHAB PHASE II EXERCISE from 04/27/2016 in [place]  Date  04/25/16  Educator  Jeanella Craze  Instruction Review Code  2- meets goals/outcomes      Stretching for Flexibility and Mobility:  -Group instruction provided by verbal instruction, patient participation, and written materials to support subject.  Instructors lead participants through series of stretches that are designed to increase flexibility thus improving mobility.  These stretches are additional exercise for major muscle groups that are typically performed during regular warm up and cool down.   Hands Only CPR Anytime:  -Group instruction provided by verbal instruction, video, patient participation and written materials to support subject.  Instructors co-teach with AHA video for hands only CPR.  Participants get hands on experience with mannequins.   Nutrition I class: Heart Healthy Eating:  -Group instruction provided by PowerPoint slides, verbal discussion, and written materials to support subject matter. The instructor gives an explanation and review of the Therapeutic Lifestyle Changes diet recommendations, which includes a discussion on lipid goals, dietary fat, sodium, fiber, plant stanol/sterol esters, sugar, and the components of a well-balanced, healthy diet.   CARDIAC REHAB PHASE II EXERCISE from 04/27/2016 in [place]  Date  04/10/16  Educator  RD  Instruction Review Code  2- meets goals/outcomes      Nutrition II class: Lifestyle Skills:  -Group instruction provided by PowerPoint slides, verbal discussion, and written materials to support subject matter. The instructor gives an explanation and review of label reading, grocery shopping for heart health, heart  healthy recipe modifications, and ways to make healthier choices when eating out.   Diabetes Question & Answer:  -Group instruction provided by PowerPoint slides, verbal discussion, and written materials to support subject matter. The instructor gives an explanation and review of diabetes co-morbidities, pre- and post-prandial blood glucose goals, pre-exercise blood glucose goals, signs, symptoms, and treatment of hypoglycemia and hyperglycemia, and foot care basics.   Diabetes Blitz:  -Group instruction provided by PowerPoint slides, verbal discussion, and written materials to support subject matter. The instructor gives an explanation and review of the physiology behind type 1 and type 2 diabetes, diabetes medications and rational behind using different medications, pre- and post-prandial blood glucose recommendations and Hemoglobin A1c goals, diabetes diet, and exercise including blood glucose guidelines for exercising safely.    Portion Distortion:  -Group instruction provided by PowerPoint slides, verbal discussion, written materials, and food models to support subject matter. The instructor gives an explanation of serving size versus portion size, changes in portions sizes over the last 20 years, and what consists of a serving from each food group.   Stress Management:  -Group instruction provided by verbal instruction, video, and written materials to support subject matter.  Instructors review role of stress in heart disease and how to cope with stress positively.     Exercising on Your Own:  -Group instruction  provided by verbal instruction, power point, and written materials to support subject.  Instructors discuss benefits of exercise, components of exercise, frequency and intensity of exercise, and end points for exercise.  Also discuss use of nitroglycerin and activating EMS.  Review options of places to exercise outside of rehab.  Review guidelines for sex with heart  disease.   Cardiac Drugs I:  -Group instruction provided by verbal instruction and written materials to support subject.  Instructor reviews cardiac drug classes: antiplatelets, anticoagulants, beta blockers, and statins.  Instructor discusses reasons, side effects, and lifestyle considerations for each drug class.   Cardiac Drugs II:  -Group instruction provided by verbal instruction and written materials to support subject.  Instructor reviews cardiac drug classes: angiotensin converting enzyme inhibitors (ACE-I), angiotensin II receptor blockers (ARBs), nitrates, and calcium channel blockers.  Instructor discusses reasons, side effects, and lifestyle considerations for each drug class.   Anatomy and Physiology of the Circulatory System:  -Group instruction provided by verbal instruction, video, and written materials to support subject.  Reviews functional anatomy of heart, how it relates to various diagnoses, and what role the heart plays in the overall system.   Knowledge Questionnaire Score:     Knowledge Questionnaire Score - 04/03/16 0844      Knowledge Questionnaire Score   Pre Score 20/24      Core Components/Risk Factors/Patient Goals at Admission:     Personal Goals and Risk Factors at Admission - 04/03/16 1621      Core Components/Risk Factors/Patient Goals on Admission    Weight Management Yes;Weight Maintenance;Weight Loss   Intervention Weight Management: Develop a combined nutrition and exercise program designed to reach desired caloric intake, while maintaining appropriate intake of nutrient and fiber, sodium and fats, and appropriate energy expenditure required for the weight goal.;Weight Management: Provide education and appropriate resources to help participant work on and attain dietary goals.;Weight Management/Obesity: Establish reasonable short term and long term weight goals.;Obesity: Provide education and appropriate resources to help participant work on and  attain dietary goals.   Expected Outcomes Short Term: Continue to assess and modify interventions until short term weight is achieved;Long Term: Adherence to nutrition and physical activity/exercise program aimed toward attainment of established weight goal;Weight Maintenance: Understanding of the daily nutrition guidelines, which includes 25-35% calories from fat, 7% or less cal from saturated fats, less than '200mg'$  cholesterol, less than 1.5gm of sodium, & 5 or more servings of fruits and vegetables daily;Weight Loss: Understanding of general recommendations for a balanced deficit meal plan, which promotes 1-2 lb weight loss per week and includes a negative energy balance of [phone] kcal/d;Understanding recommendations for meals to include 15-35% energy as protein, 25-35% energy from fat, 35-60% energy from carbohydrates, less than '200mg'$  of dietary cholesterol, 20-35 gm of total fiber daily;Understanding of distribution of calorie intake throughout the day with the consumption of 4-5 meals/snacks   Hypertension Yes   Intervention Monitor prescription use compliance.;Provide education on lifestyle modifcations including regular physical activity/exercise, weight management, moderate sodium restriction and increased consumption of fresh fruit, vegetables, and low fat dairy, alcohol moderation, and smoking cessation.   Expected Outcomes Short Term: Continued assessment and intervention until BP is < 140/52m HG in hypertensive participants. < 130/875mHG in hypertensive participants with diabetes, heart failure or chronic kidney disease.;Long Term: Maintenance of blood pressure at goal levels.   Lipids Yes   Intervention Provide education and support for participant on nutrition & aerobic/resistive exercise along with prescribed medications to achieve LDL '70mg'$ , HDL >  $'40mg'n$ .   Expected Outcomes Short Term: Participant states understanding of desired cholesterol values and is compliant with medications  prescribed. Participant is following exercise prescription and nutrition guidelines.;Long Term: Cholesterol controlled with medications as prescribed, with individualized exercise RX and with personalized nutrition plan. Value goals: LDL < '70mg'$ , HDL > 40 mg.      Core Components/Risk Factors/Patient Goals Review:    Core Components/Risk Factors/Patient Goals at Discharge (Final Review):    ITP Comments:     ITP Comments    Row Name 04/03/16 0814           ITP Comments Medical Director- Dr. Fransico Him, MD          Comments: Gwyn  is making expected progress toward personal goals after completing 11 sessions. Recommend continued exercise and life style modification education including  stress management and relaxation techniques to decrease cardiac risk profile. Kimsey reports feeling better and has not had any more complaints of feeling dizzy. Kingjames continues to enjoy participating in phase 2 cardiac rehab and is doing well with exercise.Barnet Pall, RN,BSN 05/02/2016 6:27 PM  05/02/2016 6:23 PM

## 2016-05-04 ENCOUNTER — Encounter (HOSPITAL_COMMUNITY)
Admission: RE | Admit: 2016-05-04 | Discharge: 2016-05-04 | Disposition: A | Discharge status: Home / Self Care | Payer: Medicare Other | Source: Ambulatory Visit | Attending: Cardiology | Admitting: Cardiology

## 2016-05-04 DIAGNOSIS — Z955 Presence of coronary angioplasty implant and graft: Secondary | ICD-10-CM

## 2016-05-07 ENCOUNTER — Encounter (HOSPITAL_COMMUNITY)
Admission: RE | Admit: 2016-05-07 | Discharge: 2016-05-07 | Disposition: A | Discharge status: Home / Self Care | Payer: Medicare Other | Source: Ambulatory Visit | Attending: Cardiology | Admitting: Cardiology

## 2016-05-07 DIAGNOSIS — Z955 Presence of coronary angioplasty implant and graft: Secondary | ICD-10-CM | POA: Diagnosis not present

## 2016-05-09 ENCOUNTER — Encounter (HOSPITAL_COMMUNITY)
Admission: RE | Admit: 2016-05-09 | Discharge: 2016-05-09 | Disposition: A | Discharge status: Home / Self Care | Payer: Medicare Other | Source: Ambulatory Visit | Attending: Cardiology | Admitting: Cardiology

## 2016-05-09 DIAGNOSIS — Z955 Presence of coronary angioplasty implant and graft: Secondary | ICD-10-CM | POA: Insufficient documentation

## 2016-05-11 ENCOUNTER — Encounter (HOSPITAL_COMMUNITY): Payer: Medicare Other

## 2016-05-14 ENCOUNTER — Encounter (HOSPITAL_COMMUNITY)
Admission: RE | Admit: 2016-05-14 | Discharge: 2016-05-14 | Disposition: A | Discharge status: Home / Self Care | Payer: Medicare Other | Source: Ambulatory Visit | Attending: Cardiology | Admitting: Cardiology

## 2016-05-14 DIAGNOSIS — Z955 Presence of coronary angioplasty implant and graft: Secondary | ICD-10-CM

## 2016-05-16 ENCOUNTER — Encounter (HOSPITAL_COMMUNITY)
Admission: RE | Admit: 2016-05-16 | Discharge: 2016-05-16 | Disposition: A | Discharge status: Home / Self Care | Payer: Medicare Other | Source: Ambulatory Visit | Attending: Cardiology | Admitting: Cardiology

## 2016-05-16 DIAGNOSIS — Z955 Presence of coronary angioplasty implant and graft: Secondary | ICD-10-CM | POA: Diagnosis not present

## 2016-05-18 ENCOUNTER — Encounter (HOSPITAL_COMMUNITY)
Admission: RE | Admit: 2016-05-18 | Discharge: 2016-05-18 | Disposition: A | Discharge status: Home / Self Care | Payer: Medicare Other | Source: Ambulatory Visit | Attending: Cardiology | Admitting: Cardiology

## 2016-05-18 DIAGNOSIS — Z955 Presence of coronary angioplasty implant and graft: Secondary | ICD-10-CM | POA: Diagnosis not present

## 2016-05-21 ENCOUNTER — Encounter (HOSPITAL_COMMUNITY)
Admission: RE | Admit: 2016-05-21 | Discharge: 2016-05-21 | Disposition: A | Discharge status: Home / Self Care | Payer: Medicare Other | Source: Ambulatory Visit | Attending: Cardiology | Admitting: Cardiology

## 2016-05-21 DIAGNOSIS — Z955 Presence of coronary angioplasty implant and graft: Secondary | ICD-10-CM | POA: Diagnosis not present

## 2016-05-21 NOTE — Progress Notes (Signed)
Cardiology Office Note    Date:  05/22/2016   ID:  Miguel Wolfe, DOB Dec 22, 1945, MRN [id number]  PCP:  [name], MD  Cardiologist:   Candee Furbish, MD     History of Present Illness:  Miguel Wolfe is a 71 y.o. male here for follow-up of PCI DES to [facility] on 02/20/16 after abnormal nuclear stress test showed inferolateral MI with peri-infarct ischemia, EF 51% on nuclear, 55% on echocardiogram. This was done secondary to dyspnea on exertion and performing yard work for instance. Was experiencing several months of fatigue, shortness of breath, energy decrease especially with yard work. Legs hurt bilaterally as well, constant, moderate, calf. One hour of yard work challenging. Interestingly however he is able to walk normally throughout the neighborhood without any symptoms. Flushing. He is not taking any niacin supplementation. ED as well.  Has a toe surgery planned,  Dr. Doran Durand.  With Synergy stent, we could consider delaying DAPT at 3 months post PCI, now.   His father had coronary artery disease and he also carries the diagnosis of hypertension as well as hyperlipidemia. He is not a smoker. Weight has been under control.  He was also complaining of some myalgias and pravastatin was stopped, did not seem to help. Restarted and may have felt worse. Now off. Now back on and feels no different. Excellent bounding pulses distally. CK was checked and normal. He did smoke but quit cigarettes in 1982. His father at age 53 years old had an myocardial infarction and subsequently has had bypass and stenting still alive. Mother valve issue.   He has had elevated glucose in the past and has been treated with Actos formally.  Hemoglobin 16.3 platelets 163, glucose 107, creatinine 0.81, total cholesterol 200, triglycerides 207, LDL 116, HDL 42, TSH 1.5  EKG personally viewed from 12/29/15 shows sinus rhythm heart rate 63 with no other abnormalities.  Denies syncope, bleeding, orthopnea, PND. Right  toe pain. He will need a pin placed. May be able to be done under local.  He used to work in the past for [employer] in [place].  He has been having some pain in shoulder and was questioning the possibility of taking ibuprofen for it.   Past Medical History:  Diagnosis Date  . Colon polyps   . Coronary artery disease involving native coronary artery of native heart without angina pectoris 04/23/2016   Myoview 02/08/16: EF 51, inf-lat, apical lateral scar with peri-infarct ischemia; intermediate risk  // LHC 2/18: dLAD 20, oD1 20, pLCx 20, pRCA 80, mRCA 20, dRCA 30, AM 90 >> PCI: 4 x 24 mm Synergy DES to pRCA   . Fatigue   . GERD (gastroesophageal reflux disease)   . Hemorrhoids   . History of echocardiogram    Echo 02/08/16: Mild LVH, EF 55-60, no RWMA, Gr 1 DD, trivial AI, mild LAE  . Hyperlipidemia   . Hypertension     Past Surgical History:  Procedure Laterality Date  . CARDIAC CATHETERIZATION    . COLONOSCOPY WITH ESOPHAGOGASTRODUODENOSCOPY (EGD)  2008  . CORONARY STENT INTERVENTION N/A 02/20/2016   Procedure: Coronary Stent Intervention;  Surgeon: Burnell Blanks, MD;  Location: [place] [facility];  Service: Cardiovascular;  Laterality: N/A;  . LEFT HEART CATH AND CORONARY ANGIOGRAPHY N/A 02/20/2016   Procedure: Left Heart Cath and Coronary Angiography;  Surgeon: Burnell Blanks, MD;  Location: [place] [facility];  Service: Cardiovascular;  Laterality: N/A;  . PILONIDAL CYCT    .  REFRACTIVE SURGERY      Current Medications: Outpatient Medications Prior to Visit  Medication Sig Dispense Refill  . amLODipine (NORVASC) 10 MG tablet Take 10 mg by mouth daily.    Marland Kitchen aspirin EC 81 MG tablet Take 81 mg by mouth every evening.     . calcium carbonate (TUMS - DOSED IN MG ELEMENTAL CALCIUM) 500 MG chewable tablet Chew 2 tablets by mouth daily as needed for indigestion or heartburn.    . clopidogrel (PLAVIX) 75 MG tablet Take 1 tablet (75 mg total)  by mouth daily. 90 tablet 3  . ibuprofen (ADVIL,MOTRIN) 200 MG tablet Take 200 mg by mouth 2 (two) times daily as needed for headache or moderate pain.    Marland Kitchen losartan (COZAAR) 100 MG tablet Take 100 mg by mouth daily.  0  . nitroGLYCERIN (NITROSTAT) 0.4 MG SL tablet Place 1 tablet (0.4 mg total) under the tongue every 5 (five) minutes as needed for chest pain. 25 tablet 12  . omega-3 acid ethyl esters (LOVAZA) 1 g capsule Take 2 g by mouth 2 (two) times daily.    . pantoprazole (PROTONIX) 40 MG tablet Take 1 tablet (40 mg total) by mouth daily. 30 tablet 11  . pravastatin (PRAVACHOL) 40 MG tablet Take 40 mg by mouth every evening.     . propranolol (INDERAL) 20 MG tablet Take 20 mg by mouth daily.  2  . zolpidem (AMBIEN) 10 MG tablet Take 3 mg by mouth at bedtime as needed for sleep.     . cetirizine (ZYRTEC) 10 MG tablet Take 10 mg by mouth daily.     No facility-administered medications prior to visit.      Allergies:   Gemfibrozil; Niaspan [niacin er]; and Simvastatin   Social History   Social History  . Marital status: Married    Spouse name: N/A  . Number of children: N/A  . Years of education: N/A   Social History Main Topics  . Smoking status: Former Smoker    Types: Cigarettes    Quit date: 02/01/1967  . Smokeless tobacco: Never Used  . Alcohol use Yes     Comment: a drink before dinner a few times a week  . Drug use: No  . Sexual activity: Not Asked   Other Topics Concern  . None   Social History Narrative  . None     Family History:  The patient's family history includes Colon cancer in his paternal uncle; Crohn's disease in his sister; Healthy in his sister; Heart attack (age of onset: 51) in his father.   ROS:   Please see the history of present illness.    Review of Systems  All other systems reviewed and are negative.    PHYSICAL EXAM:   VS:  BP 124/68   Pulse 73   Ht '5\' 11"'$  (1.803 m)   Wt 180 lb 2 oz (81.7 kg)   SpO2 97%   BMI 25.12 kg/m    GEN:  Well nourished, well developed, in no acute distress  HEENT: normal  Neck: no JVD, carotid bruits, or masses Cardiac: RRR; 1/6 SEM, no rubs, or gallops,no edema  Respiratory:  clear to auscultation bilaterally, normal work of breathing GI: soft, nontender, nondistended, + BS MS: no deformity or atrophy  Skin: warm and dry, no rash, excellent distal pulses Neuro:  Alert and Oriented x 3, Strength and sensation are intact Psych: euthymic mood, full affect   Wt Readings from Last 3 Encounters:  05/22/16 180  lb 2 oz (81.7 kg)  04/24/16 183 lb 1.9 oz (83.1 kg)  04/03/16 183 lb 3.2 oz (83.1 kg)      Studies/Labs Reviewed:   EKG:  Prior EKG from December reviewed as above, normal sinus rhythm with no other abnormalities. EKG ordered today on 02/06/16 shows sinus rhythm 70 with possible left ventricular hypertrophy. No ST segment changes.  Recent Labs: 04/24/2016: ALT 21; BUN 17; Creatinine, Ser 0.85; Platelets 162; Potassium 4.1; Sodium 143   Lipid Panel No results found for: CHOL, TRIG, HDL, CHOLHDL, VLDL, LDLCALC, LDLDIRECT  Additional studies/ records that were reviewed today include:  Former office notes, lab work, EKG reviewed  [place] 02/08/16  The patient walked for 7:00 of a standard Bruce protocol GXT . Peak HR of 153 which is 100% predicted maximal HR .  There were no ST or T wave changes to suggest ischemia. BP response to exercise was normal .  Defect 1: There is a medium defect of moderate severity present in the basal inferolateral, mid inferolateral and apical lateral location. This is consistent with a prior inferolateral MI with peri-infarct ischemia.  The left ventricular ejection fraction is mildly decreased (45-54%). Nuclear stress EF: 51%.  This is an intermediate risk study.   ECHO 02/08/16  - Left ventricle: The cavity size was normal. Wall thickness was   increased in a pattern of mild LVH. Systolic function was normal.   The estimated ejection  fraction was in the range of 55% to 60%.   Wall motion was normal; there were no regional wall motion   abnormalities. Doppler parameters are consistent with abnormal   left ventricular relaxation (grade 1 diastolic dysfunction). - Aortic valve: There was trivial regurgitation. - Left atrium: The atrium was mildly dilated.  Cath 02/20/16:  Acute Mrg lesion, 90 %stenosed.  Mid RCA lesion, 20 %stenosed.  Dist RCA lesion, 30 %stenosed.  Prox Cx to Mid Cx lesion, 20 %stenosed.  1st Diag lesion, 20 %stenosed.  Dist LAD lesion, 20 %stenosed.  A STENT SYNERGY DES 4X24 drug eluting stent was successfully placed.  Prox RCA to Mid RCA lesion, 80 %stenosed.  Post intervention, there is a 0% residual stenosis.   1. Severe single vessel CAD (severe stenosis mid RCA) 2. Successful PTCA/DES x 1 mid RCA  3. Mild non-obstructive disease Circumflex and LAD  Recommendations: Will continue DAPT with ASA and Plavix for one year. If he needs to interrupt anti-platelet therapy for a toe surgery, this could be considered at 3 months since a Synergy DES was placed, although it would be optimal to continue for at least 6 months. Will discharge home today as part of the same day PCI protocol. Will changed Prilosec to Protonix 40 mg daily. He will be started on Plavix 75 mg daily.  ASSESSMENT:    1. Coronary artery disease involving native coronary artery of native heart without angina pectoris   2. Essential hypertension   3. Pure hypercholesterolemia      PLAN:  In order of problems listed above:  CAD  - DES to [facility] on 02/20/16 after NUC stress.  - EF normal  - Had angina prior to PCI with yard work.   - Undergoing cardiac rehab. Enjoying.  Family history of CAD/MI  - Note his father has history of MI/CAD.  -  Occasional periodic flushing, not associated with any new supplements. He has not had any previous history of being hyper vagal, no prior syncopal episodes.  Essential hypertension   -  Medications reviewed. Well-controlled per [name]. Doing very well.  Hyperlipidemia  - Has had some challenges With possible myalgias in the past with pravastatin. This was stopped previously by [name]. He restarted it and thinks that he feels no different. He is now going to continue with his statin. CPK was normal. Seems to be tolerating pravastatin 40 mg. He will be having his lipids checked with [name] soon.  Leg pain - he is going to hold off on his surgery for his toe.  - Thankfully, his distal pedal pulses are bounding, excellent. No evidence of peripheral vascular disease.   Preoperative cardiac risk stratification -Contemplated toe surgery, Dr. Doran Durand. Since he is doing quite well with exercise he will hold off on this surgery. He has found a parachute shoes that has helped him out significantly.  - optimal to keep on DAPT for 6 months but if he is willing to take the risk, could consider 3 months or now.   This should be a relatively low risk procedure if he needs it.   Medication Adjustments/Labs and Tests Ordered: Current medicines are reviewed at length with the patient today.  Concerns regarding medicines are outlined above.  Medication changes, Labs and Tests ordered today are listed in the Patient Instructions below. Patient Instructions  Medication Instructions:  The current medical regimen is effective;  continue present plan and medications. You may take Ibuprofen as needed but not daily for shoulder pain.  Follow-Up: Follow up in 6 months with Dr. Marlou Porch.  You will receive a letter in the mail 2 months before you are due.  Please call us when you receive this letter to schedule your follow up appointment.  If you need a refill on your cardiac medications before your next appointment, please call your pharmacy.  Thank you for choosing [facility]!!        Signed, Candee Furbish, MD  05/22/2016 8:56 AM    [facility]  [facility] [address], [place], [place]  [address] Phone: [phone]; Fax: [phone]

## 2016-05-22 ENCOUNTER — Encounter: Payer: Self-pay | Admitting: Cardiology

## 2016-05-22 ENCOUNTER — Ambulatory Visit (INDEPENDENT_AMBULATORY_CARE_PROVIDER_SITE_OTHER): Payer: Medicare Other | Admitting: Cardiology

## 2016-05-22 VITALS — BP 124/68 | HR 73 | Ht 71.0 in | Wt 180.1 lb

## 2016-05-22 DIAGNOSIS — E7800 Pure hypercholesterolemia, unspecified: Secondary | ICD-10-CM | POA: Diagnosis not present

## 2016-05-22 DIAGNOSIS — I10 Essential (primary) hypertension: Secondary | ICD-10-CM | POA: Diagnosis not present

## 2016-05-22 DIAGNOSIS — I2510 Atherosclerotic heart disease of native coronary artery without angina pectoris: Secondary | ICD-10-CM | POA: Diagnosis not present

## 2016-05-22 NOTE — Patient Instructions (Signed)
Medication Instructions:  The current medical regimen is effective;  continue present plan and medications. You may take Ibuprofen as needed but not daily for shoulder pain.  Follow-Up: Follow up in 6 months with Dr. Marlou Porch.  You will receive a letter in the mail 2 months before you are due.  Please call us when you receive this letter to schedule your follow up appointment.  If you need a refill on your cardiac medications before your next appointment, please call your pharmacy.  Thank you for choosing [facility]!!

## 2016-05-23 ENCOUNTER — Encounter (HOSPITAL_COMMUNITY)
Admission: RE | Admit: 2016-05-23 | Discharge: 2016-05-23 | Disposition: A | Discharge status: Home / Self Care | Payer: Medicare Other | Source: Ambulatory Visit | Attending: Cardiology | Admitting: Cardiology

## 2016-05-23 DIAGNOSIS — Z955 Presence of coronary angioplasty implant and graft: Secondary | ICD-10-CM

## 2016-05-25 ENCOUNTER — Encounter (HOSPITAL_COMMUNITY)
Admission: RE | Admit: 2016-05-25 | Discharge: 2016-05-25 | Disposition: A | Discharge status: Home / Self Care | Payer: Medicare Other | Source: Ambulatory Visit | Attending: Cardiology | Admitting: Cardiology

## 2016-05-25 DIAGNOSIS — Z955 Presence of coronary angioplasty implant and graft: Secondary | ICD-10-CM | POA: Diagnosis not present

## 2016-05-28 ENCOUNTER — Encounter (HOSPITAL_COMMUNITY)
Admission: RE | Admit: 2016-05-28 | Discharge: 2016-05-28 | Disposition: A | Discharge status: Home / Self Care | Payer: Medicare Other | Source: Ambulatory Visit | Attending: Cardiology | Admitting: Cardiology

## 2016-05-28 DIAGNOSIS — Z955 Presence of coronary angioplasty implant and graft: Secondary | ICD-10-CM

## 2016-05-29 NOTE — Progress Notes (Signed)
Cardiac Individual Treatment Plan  Patient Details  Name: Miguel Wolfe MRN: [id number] Date of Birth: 09-04-1945 Referring Provider:     CARDIAC REHAB PHASE II ORIENTATION from 04/03/2016 in [facility]  Referring Provider  Candee Furbish MD      Initial Encounter Date:    CARDIAC REHAB PHASE II ORIENTATION from 04/03/2016 in [facility]  Date  04/03/16  Referring Provider  Candee Furbish MD      Visit Diagnosis: 02/20/16 Status post coronary artery stent placement  Patient's Home Medications on Admission:  Current Outpatient Prescriptions:  .  amLODipine (NORVASC) 10 MG tablet, Take 10 mg by mouth daily., Disp: , Rfl:  .  aspirin EC 81 MG tablet, Take 81 mg by mouth every evening. , Disp: , Rfl:  .  calcium carbonate (TUMS - DOSED IN MG ELEMENTAL CALCIUM) 500 MG chewable tablet, Chew 2 tablets by mouth daily as needed for indigestion or heartburn., Disp: , Rfl:  .  clopidogrel (PLAVIX) 75 MG tablet, Take 1 tablet (75 mg total) by mouth daily., Disp: 90 tablet, Rfl: 3 .  ibuprofen (ADVIL,MOTRIN) 200 MG tablet, Take 200 mg by mouth 2 (two) times daily as needed for headache or moderate pain., Disp: , Rfl:  .  losartan (COZAAR) 100 MG tablet, Take 100 mg by mouth daily., Disp: , Rfl: 0 .  nitroGLYCERIN (NITROSTAT) 0.4 MG SL tablet, Place 1 tablet (0.4 mg total) under the tongue every 5 (five) minutes as needed for chest pain., Disp: 25 tablet, Rfl: 12 .  omega-3 acid ethyl esters (LOVAZA) 1 g capsule, Take 2 g by mouth 2 (two) times daily., Disp: , Rfl:  .  pantoprazole (PROTONIX) 40 MG tablet, Take 1 tablet (40 mg total) by mouth daily., Disp: 30 tablet, Rfl: 11 .  pravastatin (PRAVACHOL) 40 MG tablet, Take 40 mg by mouth every evening. , Disp: , Rfl:  .  propranolol (INDERAL) 20 MG tablet, Take 20 mg by mouth daily., Disp: , Rfl: 2 .  zolpidem (AMBIEN) 10 MG tablet, Take 3 mg by mouth at bedtime as needed for sleep. , Disp: ,  Rfl:   Past Medical History: Past Medical History:  Diagnosis Date  . Colon polyps   . Coronary artery disease involving native coronary artery of native heart without angina pectoris 04/23/2016   Myoview 02/08/16: EF 51, inf-lat, apical lateral scar with peri-infarct ischemia; intermediate risk  // LHC 2/18: dLAD 20, oD1 20, pLCx 20, pRCA 80, mRCA 20, dRCA 30, AM 90 >> PCI: 4 x 24 mm Synergy DES to pRCA   . Fatigue   . GERD (gastroesophageal reflux disease)   . Hemorrhoids   . History of echocardiogram    Echo 02/08/16: Mild LVH, EF 55-60, no RWMA, Gr 1 DD, trivial AI, mild LAE  . Hyperlipidemia   . Hypertension     Tobacco Use: History  Smoking Status  . Former Smoker  . Types: Cigarettes  . Quit date: 02/01/1967  Smokeless Tobacco  . Never Used    Labs: Recent Review Flowsheet Data    There is no flowsheet data to display.      Capillary Blood Glucose: No results found for: GLUCAP   Exercise Target Goals:    Exercise Program Goal: Individual exercise prescription set with THRR, safety & activity barriers. Participant demonstrates ability to understand and report RPE using BORG scale, to self-measure pulse accurately, and to acknowledge the importance of the exercise prescription.  Exercise Prescription Goal: Starting with aerobic activity 30 plus minutes a day, 3 days per week for initial exercise prescription. Provide home exercise prescription and guidelines that participant acknowledges understanding prior to discharge.  Activity Barriers & Risk Stratification:     Activity Barriers & Cardiac Risk Stratification - 04/03/16 1046      Activity Barriers & Cardiac Risk Stratification   Activity Barriers None   Cardiac Risk Stratification High      6 Minute Walk:     6 Minute Walk    Row Name 04/03/16 0935         6 Minute Walk   Phase Initial     Distance 1775 feet     Walk Time 6 minutes     # of Rest Breaks 0     MPH 3.36     METS 3.95     RPE  11     VO2 Peak 13.83     Symptoms No     Resting HR 70 bpm     Resting BP 133/74     Max Ex. HR 88 bpm     Max Ex. BP 132/62     2 Minute Post BP 118/64        Oxygen Initial Assessment:   Oxygen Re-Evaluation:   Oxygen Discharge (Final Oxygen Re-Evaluation):   Initial Exercise Prescription:     Initial Exercise Prescription - 04/03/16 0900      Date of Initial Exercise RX and Referring Provider   Date 04/03/16   Referring Provider Candee Furbish MD     Treadmill   MPH 2.5   Grade 2   Minutes 10   METs 3.6     Bike   Level 1.3   Minutes 10   METs 3.98     NuStep   Level 3   SPM 70   Minutes 10   METs 3     Prescription Details   Frequency (times per week) 3   Duration Progress to 30 minutes of continuous aerobic without signs/symptoms of physical distress     Intensity   THRR 40-80% of Max Heartrate 61-122   Ratings of Perceived Exertion 11-13   Perceived Dyspnea 0-4     Progression   Progression Continue to progress workloads to maintain intensity without signs/symptoms of physical distress.     Resistance Training   Training Prescription Yes   Weight 3lbs   Reps 10-15      Perform Capillary Blood Glucose checks as needed.  Exercise Prescription Changes:     Exercise Prescription Changes    Row Name 04/26/16 1600 05/16/16 1600           Response to Exercise   Blood Pressure (Admit) 128/70 120/78      Blood Pressure (Exercise) 128/78 144/78      Blood Pressure (Exit) 120/68 102/72      Heart Rate (Admit) 70 bpm 71 bpm      Heart Rate (Exercise) 99 bpm 120 bpm      Heart Rate (Exit) 70 bpm 73 bpm      Rating of Perceived Exertion (Exercise) 12 14      Duration Continue with 45 min of aerobic exercise without signs/symptoms of physical distress. Continue with 45 min of aerobic exercise without signs/symptoms of physical distress.      Intensity THRR unchanged THRR unchanged        Progression   Progression Continue to progress  workloads to maintain intensity without signs/symptoms  of physical distress. Continue to progress workloads to maintain intensity without signs/symptoms of physical distress.      Average METs 4.4 6.2        Resistance Training   Training Prescription Yes Yes      Weight 3lbs 4lb      Reps 10-15 10-15        Treadmill   MPH 3 3      Grade 4 8      Minutes 10 10      METs 4.95 6.61        Bike   Level 1.3 2.5      Minutes 10 10      METs 3.98 6.73        NuStep   Level 4 7      SPM 70 90      Minutes 10 10      METs 3.5 7.7        Home Exercise Plan   Plans to continue exercise at Home (comment) Home (comment)      Frequency Add 4 additional days to program exercise sessions. Add 4 additional days to program exercise sessions.      Initial Home Exercises Provided 04/16/16 04/16/16         Exercise Comments:     Exercise Comments    Row Name 04/26/16 1630 05/02/16 1714 05/23/16 1654       Exercise Comments Reviewed METs and goals with pt.  He is doing well with exercise.  Received clearance for patient to begin HIIT. Oriented patient to HIIT protocal. Reviewed MeTs and goals with pt.  Pt is doing great with exercise.          Exercise Goals and Review:     Exercise Goals    Row Name 04/03/16 1050             Exercise Goals   Increase Physical Activity Yes       Intervention Provide advice, education, support and counseling about physical activity/exercise needs.;Develop an individualized exercise prescription for aerobic and resistive training based on initial evaluation findings, risk stratification, comorbidities and participant's personal goals.       Expected Outcomes Achievement of increased cardiorespiratory fitness and enhanced flexibility, muscular endurance and strength shown through measurements of functional capacity and personal statement of participant.       Increase Strength and Stamina Yes  have more energy in the afternoon and over the long  term be able to hike 6-7 miles in the [employer] Provide advice, education, support and counseling about physical activity/exercise needs.;Develop an individualized exercise prescription for aerobic and resistive training based on initial evaluation findings, risk stratification, comorbidities and participant's personal goals.       Expected Outcomes Achievement of increased cardiorespiratory fitness and enhanced flexibility, muscular endurance and strength shown through measurements of functional capacity and personal statement of participant.          Exercise Goals Re-Evaluation :     Exercise Goals Re-Evaluation    Row Name 04/26/16 1628 05/23/16 1652 05/23/16 1654         Exercise Goal Re-Evaluation   Exercise Goals Review Increase Physical Activity;Increase Strenth and Stamina Increase Physical Activity;Increase Strenth and Stamina  -     Comments Pt states he feels like he's still not where he wants to be, however, he does feel stronger and has more stamina than he did compared to when he first started  the program.  Pt is doing well with exercise and high intensity interval training.  He states that he feels stronger and feels like he's turned a corner for the best and overall he feels great.  -     Expected Outcomes Continue with CR exercise prescription and HEP in order to increase strength and stamina Continue with CR exercise prescription and HEP in order to increase strength and stamina Continue with CR exercise prescription, HEP and increase workloads as tolerated in order to increase strength and stamina         Discharge Exercise Prescription (Final Exercise Prescription Changes):     Exercise Prescription Changes - 05/16/16 1600      Response to Exercise   Blood Pressure (Admit) 120/78   Blood Pressure (Exercise) 144/78   Blood Pressure (Exit) 102/72   Heart Rate (Admit) 71 bpm   Heart Rate (Exercise) 120 bpm   Heart Rate (Exit) 73 bpm   Rating of  Perceived Exertion (Exercise) 14   Duration Continue with 45 min of aerobic exercise without signs/symptoms of physical distress.   Intensity THRR unchanged     Progression   Progression Continue to progress workloads to maintain intensity without signs/symptoms of physical distress.   Average METs 6.2     Resistance Training   Training Prescription Yes   Weight 4lb   Reps 10-15     Treadmill   MPH 3   Grade 8   Minutes 10   METs 6.61     Bike   Level 2.5   Minutes 10   METs 6.73     NuStep   Level 7   SPM 90   Minutes 10   METs 7.7     Home Exercise Plan   Plans to continue exercise at Home (comment)   Frequency Add 4 additional days to program exercise sessions.   Initial Home Exercises Provided 04/16/16      Nutrition:  Target Goals: Understanding of nutrition guidelines, daily intake of sodium '1500mg'$ , cholesterol '200mg'$ , calories 30% from fat and 7% or less from saturated fats, daily to have 5 or more servings of fruits and vegetables.  Biometrics:     Pre Biometrics - 04/03/16 0936      Pre Biometrics   Height 5' 11.5" (1.816 m)   Weight 183 lb 3.2 oz (83.1 kg)   Waist Circumference 39 inches   Hip Circumference 39 inches   Waist to Hip Ratio 1 %   BMI (Calculated) 25.2   Triceps Skinfold 19 mm   % Body Fat 26.8 %   Grip Strength 43 kg   Flexibility 0 in   Single Leg Stand 30 seconds       Nutrition Therapy Plan and Nutrition Goals:     Nutrition Therapy & Goals - 04/04/16 1123      Nutrition Therapy   Diet Therapeutic Lifestyle Changes     Personal Nutrition Goals   Nutrition Goal Wt loss of 1-2 lb/week to a wt loss goal of 6-8 lb at graduation from [facility], educate and counsel regarding individualized specific dietary modifications aiming towards targeted core components such as weight, hypertension, lipid management, diabetes, heart failure and other comorbidities.   Expected  Outcomes Short Term Goal: Understand basic principles of dietary content, such as calories, fat, sodium, cholesterol and nutrients.;Long Term Goal: Adherence to prescribed nutrition plan.      Nutrition Discharge: Nutrition Scores:  Nutrition Assessments - 04/04/16 1123      MEDFICTS Scores   Pre Score 30      Nutrition Goals Re-Evaluation:   Nutrition Goals Re-Evaluation:   Nutrition Goals Discharge (Final Nutrition Goals Re-Evaluation):   Psychosocial: Target Goals: Acknowledge presence or absence of significant depression and/or stress, maximize coping skills, provide positive support system. Participant is able to verbalize types and ability to use techniques and skills needed for reducing stress and depression.  Initial Review & Psychosocial Screening:     Initial Psych Review & Screening - 04/03/16 1616      Initial Review   Current issues with None Identified     Family Dynamics   Good Support System? Yes     Barriers   Psychosocial barriers to participate in program There are no identifiable barriers or psychosocial needs.     Screening Interventions   Interventions Encouraged to exercise      Quality of Life Scores:     Quality of Life - 04/03/16 0844      Quality of Life Scores   Health/Function Pre 25.96 %   Socioeconomic Pre 25.14 %   Psych/Spiritual Pre 28.75 %   Family Pre 27.6 %   GLOBAL Pre 26.56 %      PHQ-9: Recent Review Flowsheet Data    Depression screen [facility] 2/9 04/09/2016   Decreased Interest 0   Down, Depressed, Hopeless 0   PHQ - 2 Score 0     Interpretation of Total Score  Total Score Depression Severity:  1-4 = Minimal depression, 5-9 = Mild depression, 10-14 = Moderate depression, 15-19 = Moderately severe depression, 20-27 = Severe depression   Psychosocial Evaluation and Intervention:   Psychosocial Re-Evaluation:     Psychosocial Re-Evaluation    [place] Name 05/02/16 1820 05/29/16 0913            Psychosocial Re-Evaluation   Current issues with None Identified None Identified      Interventions Encouraged to attend Cardiac Rehabilitation for the exercise Encouraged to attend Cardiac Rehabilitation for the exercise      Continue Psychosocial Services  No Follow up required No Follow up required         Psychosocial Discharge (Final Psychosocial Re-Evaluation):     Psychosocial Re-Evaluation - 05/29/16 0913      Psychosocial Re-Evaluation   Current issues with None Identified   Interventions Encouraged to attend Cardiac Rehabilitation for the exercise   Continue Psychosocial Services  No Follow up required      Vocational Rehabilitation: Provide vocational rehab assistance to qualifying candidates.   Vocational Rehab Evaluation & Intervention:     Vocational Rehab - 04/03/16 1614      Initial Vocational Rehab Evaluation & Intervention   Assessment shows need for Vocational Rehabilitation No  Mr Miguel Wolfe is retired and does not need vocational rehab a tthis time.      Education: Education Goals: Education classes will be provided on a weekly basis, covering required topics. Participant will state understanding/return demonstration of topics presented.  Learning Barriers/Preferences:     Learning Barriers/Preferences - 04/03/16 0836      Learning Barriers/Preferences   Learning Barriers Sight   Learning Preferences Written Material;Verbal Instruction;Skilled Demonstration;Video;Pictoral      Education Topics: Count Your Pulse:  -Group instruction provided by verbal instruction, demonstration, patient participation and written materials to support subject.  Instructors address importance of being able to find your pulse and how to count your pulse when at home without a  heart monitor.  Patients get hands on experience counting their pulse with staff help and individually.   Heart Attack, Angina, and Risk Factor Modification:  -Group instruction provided by verbal  instruction, video, and written materials to support subject.  Instructors address signs and symptoms of angina and heart attacks.    Also discuss risk factors for heart disease and how to make changes to improve heart health risk factors.   Functional Fitness:  -Group instruction provided by verbal instruction, demonstration, patient participation, and written materials to support subject.  Instructors address safety measures for doing things around the house.  Discuss how to get up and down off the floor, how to pick things up properly, how to safely get out of a chair without assistance, and balance training.   CARDIAC REHAB PHASE II EXERCISE from 05/16/2016 in [facility]  Date  04/27/16  Instruction Review Code  2- meets goals/outcomes      Meditation and Mindfulness:  -Group instruction provided by verbal instruction, patient participation, and written materials to support subject.  Instructor addresses importance of mindfulness and meditation practice to help reduce stress and improve awareness.  Instructor also leads participants through a meditation exercise.    CARDIAC REHAB PHASE II EXERCISE from 05/16/2016 in [facility]  Date  04/25/16  Educator  Jeanella Craze  Instruction Review Code  2- meets goals/outcomes      Stretching for Flexibility and Mobility:  -Group instruction provided by verbal instruction, patient participation, and written materials to support subject.  Instructors lead participants through series of stretches that are designed to increase flexibility thus improving mobility.  These stretches are additional exercise for major muscle groups that are typically performed during regular warm up and cool down.   CARDIAC REHAB PHASE II EXERCISE from 05/16/2016 in [place]  Date  05/04/16  Instruction Review Code  2- meets goals/outcomes      Hands Only CPR:  -Group verbal,  video, and participation provides a basic overview of AHA guidelines for community CPR. Role-play of emergencies allow participants the opportunity to practice calling for help and chest compression technique with discussion of AED use.   Hypertension: -Group verbal and written instruction that provides a basic overview of hypertension including the most recent diagnostic guidelines, risk factor reduction with self-care instructions and medication management.    Nutrition I class: Heart Healthy Eating:  -Group instruction provided by PowerPoint slides, verbal discussion, and written materials to support subject matter. The instructor gives an explanation and review of the Therapeutic Lifestyle Changes diet recommendations, which includes a discussion on lipid goals, dietary fat, sodium, fiber, plant stanol/sterol esters, sugar, and the components of a well-balanced, healthy diet.   CARDIAC REHAB PHASE II EXERCISE from 05/16/2016 in [facility]  Date  04/10/16  Educator  RD  Instruction Review Code  2- meets goals/outcomes      Nutrition II class: Lifestyle Skills:  -Group instruction provided by PowerPoint slides, verbal discussion, and written materials to support subject matter. The instructor gives an explanation and review of label reading, grocery shopping for heart health, heart healthy recipe modifications, and ways to make healthier choices when eating out.   CARDIAC REHAB PHASE II EXERCISE from 05/16/2016 in [place]  Date  05/15/16  Educator  RD  Instruction Review Code  2- meets goals/outcomes      Diabetes Question & Answer:  -  Group instruction provided by PowerPoint slides, verbal discussion, and written materials to support subject matter. The instructor gives an explanation and review of diabetes co-morbidities, pre- and post-prandial blood glucose goals, pre-exercise blood glucose goals, signs, symptoms, and  treatment of hypoglycemia and hyperglycemia, and foot care basics.   Diabetes Blitz:  -Group instruction provided by PowerPoint slides, verbal discussion, and written materials to support subject matter. The instructor gives an explanation and review of the physiology behind type 1 and type 2 diabetes, diabetes medications and rational behind using different medications, pre- and post-prandial blood glucose recommendations and Hemoglobin A1c goals, diabetes diet, and exercise including blood glucose guidelines for exercising safely.    Portion Distortion:  -Group instruction provided by PowerPoint slides, verbal discussion, written materials, and food models to support subject matter. The instructor gives an explanation of serving size versus portion size, changes in portions sizes over the last 20 years, and what consists of a serving from each food group.   CARDIAC REHAB PHASE II EXERCISE from 05/16/2016 in [place]  Date  05/03/16  Educator  RD  Instruction Review Code  2- meets goals/outcomes      Stress Management:  -Group instruction provided by verbal instruction, video, and written materials to support subject matter.  Instructors review role of stress in heart disease and how to cope with stress positively.     CARDIAC REHAB PHASE II EXERCISE from 05/16/2016 in [place]  Date  05/09/16  Instruction Review Code  2- meets goals/outcomes      Exercising on Your Own:  -Group instruction provided by verbal instruction, power point, and written materials to support subject.  Instructors discuss benefits of exercise, components of exercise, frequency and intensity of exercise, and end points for exercise.  Also discuss use of nitroglycerin and activating EMS.  Review options of places to exercise outside of rehab.  Review guidelines for sex with heart disease.   Cardiac Drugs I:  -Group instruction provided by verbal  instruction and written materials to support subject.  Instructor reviews cardiac drug classes: antiplatelets, anticoagulants, beta blockers, and statins.  Instructor discusses reasons, side effects, and lifestyle considerations for each drug class.   Cardiac Drugs II:  -Group instruction provided by verbal instruction and written materials to support subject.  Instructor reviews cardiac drug classes: angiotensin converting enzyme inhibitors (ACE-I), angiotensin II receptor blockers (ARBs), nitrates, and calcium channel blockers.  Instructor discusses reasons, side effects, and lifestyle considerations for each drug class.   CARDIAC REHAB PHASE II EXERCISE from 05/16/2016 in [place]  Date  05/16/16  Instruction Review Code  2- meets goals/outcomes      Anatomy and Physiology of the Circulatory System:  Group verbal and written instruction and models provide basic cardiac anatomy and physiology, with the coronary electrical and arterial systems. Review of: AMI, Angina, Valve disease, Heart Failure, Peripheral Artery Disease, Cardiac Arrhythmia, Pacemakers, and the ICD.   Other Education:  -Group or individual verbal, written, or video instructions that support the educational goals of the cardiac rehab program.   Knowledge Questionnaire Score:     Knowledge Questionnaire Score - 04/03/16 0844      Knowledge Questionnaire Score   Pre Score 20/24      Core Components/Risk Factors/Patient Goals at Admission:     Personal Goals and Risk Factors at Admission - 04/03/16 1621      Core Components/Risk Factors/Patient Goals on Admission  Weight Management Yes;Weight Maintenance;Weight Loss   Intervention Weight Management: Develop a combined nutrition and exercise program designed to reach desired caloric intake, while maintaining appropriate intake of nutrient and fiber, sodium and fats, and appropriate energy expenditure required for the weight  goal.;Weight Management: Provide education and appropriate resources to help participant work on and attain dietary goals.;Weight Management/Obesity: Establish reasonable short term and long term weight goals.;Obesity: Provide education and appropriate resources to help participant work on and attain dietary goals.   Expected Outcomes Short Term: Continue to assess and modify interventions until short term weight is achieved;Long Term: Adherence to nutrition and physical activity/exercise program aimed toward attainment of established weight goal;Weight Maintenance: Understanding of the daily nutrition guidelines, which includes 25-35% calories from fat, 7% or less cal from saturated fats, less than '200mg'$  cholesterol, less than 1.5gm of sodium, & 5 or more servings of fruits and vegetables daily;Weight Loss: Understanding of general recommendations for a balanced deficit meal plan, which promotes 1-2 lb weight loss per week and includes a negative energy balance of [phone] kcal/d;Understanding recommendations for meals to include 15-35% energy as protein, 25-35% energy from fat, 35-60% energy from carbohydrates, less than '200mg'$  of dietary cholesterol, 20-35 gm of total fiber daily;Understanding of distribution of calorie intake throughout the day with the consumption of 4-5 meals/snacks   Hypertension Yes   Intervention Monitor prescription use compliance.;Provide education on lifestyle modifcations including regular physical activity/exercise, weight management, moderate sodium restriction and increased consumption of fresh fruit, vegetables, and low fat dairy, alcohol moderation, and smoking cessation.   Expected Outcomes Short Term: Continued assessment and intervention until BP is < 140/16m HG in hypertensive participants. < 130/887mHG in hypertensive participants with diabetes, heart failure or chronic kidney disease.;Long Term: Maintenance of blood pressure at goal levels.   Lipids Yes   Intervention  Provide education and support for participant on nutrition & aerobic/resistive exercise along with prescribed medications to achieve LDL '70mg'$ , HDL >'40mg'$ .   Expected Outcomes Short Term: Participant states understanding of desired cholesterol values and is compliant with medications prescribed. Participant is following exercise prescription and nutrition guidelines.;Long Term: Cholesterol controlled with medications as prescribed, with individualized exercise RX and with personalized nutrition plan. Value goals: LDL < '70mg'$ , HDL > 40 mg.      Core Components/Risk Factors/Patient Goals Review:    Core Components/Risk Factors/Patient Goals at Discharge (Final Review):    ITP Comments:     ITP Comments    Row Name 04/03/16 0814           ITP Comments Medical Director- Dr. TrFransico HimMD          Comments: WaBlaises making expected progress toward personal goals after completing 21 sessions. Recommend continued exercise and life style modification education including  stress management and relaxation techniques to decrease cardiac risk profile. WaChozens doing well with exercise and  High Intensity Training and is enjoying participating in phase 2 cardiac rehab.MaBarnet PallRN,BSN 05/29/2016 9:45 AM

## 2016-05-30 ENCOUNTER — Encounter (HOSPITAL_COMMUNITY)
Admission: RE | Admit: 2016-05-30 | Discharge: 2016-05-30 | Disposition: A | Discharge status: Home / Self Care | Payer: Medicare Other | Source: Ambulatory Visit | Attending: Cardiology | Admitting: Cardiology

## 2016-05-30 DIAGNOSIS — Z955 Presence of coronary angioplasty implant and graft: Secondary | ICD-10-CM

## 2016-05-31 DIAGNOSIS — M7542 Impingement syndrome of left shoulder: Secondary | ICD-10-CM | POA: Diagnosis not present

## 2016-06-01 ENCOUNTER — Encounter (HOSPITAL_COMMUNITY): Payer: Medicare Other

## 2016-06-06 ENCOUNTER — Encounter (HOSPITAL_COMMUNITY)
Admission: RE | Admit: 2016-06-06 | Discharge: 2016-06-06 | Disposition: A | Discharge status: Home / Self Care | Payer: Medicare Other | Source: Ambulatory Visit | Attending: Cardiology | Admitting: Cardiology

## 2016-06-06 DIAGNOSIS — Z955 Presence of coronary angioplasty implant and graft: Secondary | ICD-10-CM | POA: Diagnosis not present

## 2016-06-08 ENCOUNTER — Encounter (HOSPITAL_COMMUNITY)
Admission: RE | Admit: 2016-06-08 | Discharge: 2016-06-08 | Disposition: A | Discharge status: Home / Self Care | Payer: Medicare Other | Source: Ambulatory Visit | Attending: Cardiology | Admitting: Cardiology

## 2016-06-08 DIAGNOSIS — Z955 Presence of coronary angioplasty implant and graft: Secondary | ICD-10-CM | POA: Diagnosis not present

## 2016-06-11 ENCOUNTER — Encounter (HOSPITAL_COMMUNITY)
Admission: RE | Admit: 2016-06-11 | Discharge: 2016-06-11 | Disposition: A | Discharge status: Home / Self Care | Payer: Medicare Other | Source: Ambulatory Visit | Attending: Cardiology | Admitting: Cardiology

## 2016-06-11 DIAGNOSIS — Z955 Presence of coronary angioplasty implant and graft: Secondary | ICD-10-CM

## 2016-06-13 ENCOUNTER — Encounter (HOSPITAL_COMMUNITY): Payer: Medicare Other

## 2016-06-15 ENCOUNTER — Encounter (HOSPITAL_COMMUNITY): Payer: Medicare Other

## 2016-06-18 ENCOUNTER — Encounter (HOSPITAL_COMMUNITY)
Admission: RE | Admit: 2016-06-18 | Discharge: 2016-06-18 | Disposition: A | Discharge status: Home / Self Care | Payer: Medicare Other | Source: Ambulatory Visit | Attending: Cardiology | Admitting: Cardiology

## 2016-06-18 DIAGNOSIS — Z955 Presence of coronary angioplasty implant and graft: Secondary | ICD-10-CM

## 2016-06-20 ENCOUNTER — Encounter (HOSPITAL_COMMUNITY)
Admission: RE | Admit: 2016-06-20 | Discharge: 2016-06-20 | Disposition: A | Discharge status: Home / Self Care | Payer: Medicare Other | Source: Ambulatory Visit | Attending: Cardiology | Admitting: Cardiology

## 2016-06-20 DIAGNOSIS — Z955 Presence of coronary angioplasty implant and graft: Secondary | ICD-10-CM

## 2016-06-22 ENCOUNTER — Encounter (HOSPITAL_COMMUNITY)
Admission: RE | Admit: 2016-06-22 | Discharge: 2016-06-22 | Disposition: A | Discharge status: Home / Self Care | Payer: Medicare Other | Source: Ambulatory Visit | Attending: Cardiology | Admitting: Cardiology

## 2016-06-22 DIAGNOSIS — Z955 Presence of coronary angioplasty implant and graft: Secondary | ICD-10-CM | POA: Diagnosis not present

## 2016-06-25 ENCOUNTER — Encounter (HOSPITAL_COMMUNITY)
Admission: RE | Admit: 2016-06-25 | Discharge: 2016-06-25 | Disposition: A | Discharge status: Home / Self Care | Payer: Medicare Other | Source: Ambulatory Visit | Attending: Cardiology | Admitting: Cardiology

## 2016-06-25 DIAGNOSIS — Z955 Presence of coronary angioplasty implant and graft: Secondary | ICD-10-CM

## 2016-06-26 NOTE — Progress Notes (Signed)
Cardiac Individual Treatment Plan  Patient Details  Name: Miguel Wolfe MRN: [id number] Date of Birth: 09-04-1945 Referring Provider:     CARDIAC REHAB PHASE II ORIENTATION from 04/03/2016 in [facility]  Referring Provider  Candee Furbish MD      Initial Encounter Date:    CARDIAC REHAB PHASE II ORIENTATION from 04/03/2016 in [facility]  Date  04/03/16  Referring Provider  Candee Furbish MD      Visit Diagnosis: 02/20/16 Status post coronary artery stent placement  Patient's Home Medications on Admission:  Current Outpatient Prescriptions:  .  amLODipine (NORVASC) 10 MG tablet, Take 10 mg by mouth daily., Disp: , Rfl:  .  aspirin EC 81 MG tablet, Take 81 mg by mouth every evening. , Disp: , Rfl:  .  calcium carbonate (TUMS - DOSED IN MG ELEMENTAL CALCIUM) 500 MG chewable tablet, Chew 2 tablets by mouth daily as needed for indigestion or heartburn., Disp: , Rfl:  .  clopidogrel (PLAVIX) 75 MG tablet, Take 1 tablet (75 mg total) by mouth daily., Disp: 90 tablet, Rfl: 3 .  ibuprofen (ADVIL,MOTRIN) 200 MG tablet, Take 200 mg by mouth 2 (two) times daily as needed for headache or moderate pain., Disp: , Rfl:  .  losartan (COZAAR) 100 MG tablet, Take 100 mg by mouth daily., Disp: , Rfl: 0 .  nitroGLYCERIN (NITROSTAT) 0.4 MG SL tablet, Place 1 tablet (0.4 mg total) under the tongue every 5 (five) minutes as needed for chest pain., Disp: 25 tablet, Rfl: 12 .  omega-3 acid ethyl esters (LOVAZA) 1 g capsule, Take 2 g by mouth 2 (two) times daily., Disp: , Rfl:  .  pantoprazole (PROTONIX) 40 MG tablet, Take 1 tablet (40 mg total) by mouth daily., Disp: 30 tablet, Rfl: 11 .  pravastatin (PRAVACHOL) 40 MG tablet, Take 40 mg by mouth every evening. , Disp: , Rfl:  .  propranolol (INDERAL) 20 MG tablet, Take 20 mg by mouth daily., Disp: , Rfl: 2 .  zolpidem (AMBIEN) 10 MG tablet, Take 3 mg by mouth at bedtime as needed for sleep. , Disp: ,  Rfl:   Past Medical History: Past Medical History:  Diagnosis Date  . Colon polyps   . Coronary artery disease involving native coronary artery of native heart without angina pectoris 04/23/2016   Myoview 02/08/16: EF 51, inf-lat, apical lateral scar with peri-infarct ischemia; intermediate risk  // LHC 2/18: dLAD 20, oD1 20, pLCx 20, pRCA 80, mRCA 20, dRCA 30, AM 90 >> PCI: 4 x 24 mm Synergy DES to pRCA   . Fatigue   . GERD (gastroesophageal reflux disease)   . Hemorrhoids   . History of echocardiogram    Echo 02/08/16: Mild LVH, EF 55-60, no RWMA, Gr 1 DD, trivial AI, mild LAE  . Hyperlipidemia   . Hypertension     Tobacco Use: History  Smoking Status  . Former Smoker  . Types: Cigarettes  . Quit date: 02/01/1967  Smokeless Tobacco  . Never Used    Labs: Recent Review Flowsheet Data    There is no flowsheet data to display.      Capillary Blood Glucose: No results found for: GLUCAP   Exercise Target Goals:    Exercise Program Goal: Individual exercise prescription set with THRR, safety & activity barriers. Participant demonstrates ability to understand and report RPE using BORG scale, to self-measure pulse accurately, and to acknowledge the importance of the exercise prescription.  Exercise Prescription Goal: Starting with aerobic activity 30 plus minutes a day, 3 days per week for initial exercise prescription. Provide home exercise prescription and guidelines that participant acknowledges understanding prior to discharge.  Activity Barriers & Risk Stratification:     Activity Barriers & Cardiac Risk Stratification - 04/03/16 1046      Activity Barriers & Cardiac Risk Stratification   Activity Barriers None   Cardiac Risk Stratification High      6 Minute Walk:     6 Minute Walk    Row Name 04/03/16 0935         6 Minute Walk   Phase Initial     Distance 1775 feet     Walk Time 6 minutes     # of Rest Breaks 0     MPH 3.36     METS 3.95     RPE  11     VO2 Peak 13.83     Symptoms No     Resting HR 70 bpm     Resting BP 133/74     Max Ex. HR 88 bpm     Max Ex. BP 132/62     2 Minute Post BP 118/64        Oxygen Initial Assessment:   Oxygen Re-Evaluation:   Oxygen Discharge (Final Oxygen Re-Evaluation):   Initial Exercise Prescription:     Initial Exercise Prescription - 04/03/16 0900      Date of Initial Exercise RX and Referring Provider   Date 04/03/16   Referring Provider Candee Furbish MD     Treadmill   MPH 2.5   Grade 2   Minutes 10   METs 3.6     Bike   Level 1.3   Minutes 10   METs 3.98     NuStep   Level 3   SPM 70   Minutes 10   METs 3     Prescription Details   Frequency (times per week) 3   Duration Progress to 30 minutes of continuous aerobic without signs/symptoms of physical distress     Intensity   THRR 40-80% of Max Heartrate 61-122   Ratings of Perceived Exertion 11-13   Perceived Dyspnea 0-4     Progression   Progression Continue to progress workloads to maintain intensity without signs/symptoms of physical distress.     Resistance Training   Training Prescription Yes   Weight 3lbs   Reps 10-15      Perform Capillary Blood Glucose checks as needed.  Exercise Prescription Changes:      Exercise Prescription Changes    Row Name 04/26/16 1600 05/16/16 1600 06/27/16 1200         Response to Exercise   Blood Pressure (Admit) 128/70 120/78 124/70     Blood Pressure (Exercise) 128/78 144/78 160/80     Blood Pressure (Exit) 120/68 102/72 110/70     Heart Rate (Admit) 70 bpm 71 bpm 70 bpm     Heart Rate (Exercise) 99 bpm 120 bpm 115 bpm     Heart Rate (Exit) 70 bpm 73 bpm 68 bpm     Rating of Perceived Exertion (Exercise) 12 14 15      Duration Continue with 45 min of aerobic exercise without signs/symptoms of physical distress. Continue with 45 min of aerobic exercise without signs/symptoms of physical distress. Continue with 45 min of aerobic exercise without  signs/symptoms of physical distress.     Intensity THRR unchanged THRR unchanged THRR unchanged  Progression   Progression Continue to progress workloads to maintain intensity without signs/symptoms of physical distress. Continue to progress workloads to maintain intensity without signs/symptoms of physical distress. Continue to progress workloads to maintain intensity without signs/symptoms of physical distress.     Average METs 4.4 6.2 6.4       Resistance Training   Training Prescription Yes Yes Yes     Weight 3lbs 4lb 0     Reps 10-15 10-15 10-15       Treadmill   MPH 3 3 4      Grade 4 8 8      Minutes 10 10 10      METs 4.95 6.61 8.47       Bike   Level 1.3 2.5 2.5     Minutes 10 10 10      METs 3.98 6.73 6.73       NuStep   Level 4 7 7      SPM 70 90 90     Minutes 10 10 10      METs 3.5 7.7 5.4       Home Exercise Plan   Plans to continue exercise at Home (comment) Home (comment) Home (comment)     Frequency Add 4 additional days to program exercise sessions. Add 4 additional days to program exercise sessions. Add 4 additional days to program exercise sessions.     Initial Home Exercises Provided 04/16/16 04/16/16 04/16/16        Exercise Comments:      Exercise Comments    Row Name 04/26/16 1630 05/02/16 1714 05/23/16 1654 06/27/16 1156     Exercise Comments Reviewed METs and goals with pt.  He is doing well with exercise.  Received clearance for patient to begin HIIT. Oriented patient to HIIT protocal. Reviewed MeTs and goals with pt.  Pt is doing great with exercise.   Revivewed METs and goals with pt.        Exercise Goals and Review:      Exercise Goals    Row Name 04/03/16 1050             Exercise Goals   Increase Physical Activity Yes       Intervention Provide advice, education, support and counseling about physical activity/exercise needs.;Develop an individualized exercise prescription for aerobic and resistive training based on initial  evaluation findings, risk stratification, comorbidities and participant's personal goals.       Expected Outcomes Achievement of increased cardiorespiratory fitness and enhanced flexibility, muscular endurance and strength shown through measurements of functional capacity and personal statement of participant.       Increase Strength and Stamina Yes  have more energy in the afternoon and over the long term be able to hike 6-7 miles in the [employer] Provide advice, education, support and counseling about physical activity/exercise needs.;Develop an individualized exercise prescription for aerobic and resistive training based on initial evaluation findings, risk stratification, comorbidities and participant's personal goals.       Expected Outcomes Achievement of increased cardiorespiratory fitness and enhanced flexibility, muscular endurance and strength shown through measurements of functional capacity and personal statement of participant.          Exercise Goals Re-Evaluation :     Exercise Goals Re-Evaluation    Row Name 04/26/16 1628 05/23/16 1652 05/23/16 1654 06/27/16 1154       Exercise Goal Re-Evaluation   Exercise Goals Review Increase Physical Activity;Increase Strenth and Stamina Increase Physical Activity;Increase Strenth  and Stamina  - Increase Physical Activity;Increase Strenth and Stamina    Comments Pt states he feels like he's still not where he wants to be, however, he does feel stronger and has more stamina than he did compared to when he first started the program.  Pt is doing well with exercise and high intensity interval training.  He states that he feels stronger and feels like he's turned a corner for the best and overall he feels great.  - Pt has shown significant improvements in his 6 minute walk test, grip strength, waist circumference and body fat since he first began the program.      Expected Outcomes Continue with CR exercise prescription and HEP  in order to increase strength and stamina Continue with CR exercise prescription and HEP in order to increase strength and stamina Continue with CR exercise prescription, HEP and increase workloads as tolerated in order to increase strength and stamina Continue with CR exercise prescription, HEP and increase workloads as tolerated in order to increase strength and stamina        Discharge Exercise Prescription (Final Exercise Prescription Changes):     Exercise Prescription Changes - 06/27/16 1200      Response to Exercise   Blood Pressure (Admit) 124/70   Blood Pressure (Exercise) 160/80   Blood Pressure (Exit) 110/70   Heart Rate (Admit) 70 bpm   Heart Rate (Exercise) 115 bpm   Heart Rate (Exit) 68 bpm   Rating of Perceived Exertion (Exercise) 15   Duration Continue with 45 min of aerobic exercise without signs/symptoms of physical distress.   Intensity THRR unchanged     Progression   Progression Continue to progress workloads to maintain intensity without signs/symptoms of physical distress.   Average METs 6.4     Resistance Training   Training Prescription Yes   Weight 0   Reps 10-15     Treadmill   MPH 4   Grade 8   Minutes 10   METs 8.47     Bike   Level 2.5   Minutes 10   METs 6.73     NuStep   Level 7   SPM 90   Minutes 10   METs 5.4     Home Exercise Plan   Plans to continue exercise at Home (comment)   Frequency Add 4 additional days to program exercise sessions.   Initial Home Exercises Provided 04/16/16      Nutrition:  Target Goals: Understanding of nutrition guidelines, daily intake of sodium 1500mg , cholesterol 200mg , calories 30% from fat and 7% or less from saturated fats, daily to have 5 or more servings of fruits and vegetables.  Biometrics:     Pre Biometrics - 04/03/16 0936      Pre Biometrics   Height 5' 11.5" (1.816 m)   Weight 183 lb 3.2 oz (83.1 kg)   Waist Circumference 39 inches   Hip Circumference 39 inches   Waist to  Hip Ratio 1 %   BMI (Calculated) 25.2   Triceps Skinfold 19 mm   % Body Fat 26.8 %   Grip Strength 43 kg   Flexibility 0 in   Single Leg Stand 30 seconds       Nutrition Therapy Plan and Nutrition Goals:     Nutrition Therapy & Goals - 04/04/16 1123      Nutrition Therapy   Diet Therapeutic Lifestyle Changes     Personal Nutrition Goals   Nutrition Goal Wt loss of 1-2 lb/week  to a wt loss goal of 6-8 lb at graduation from [place], educate and counsel regarding individualized specific dietary modifications aiming towards targeted core components such as weight, hypertension, lipid management, diabetes, heart failure and other comorbidities.   Expected Outcomes Short Term Goal: Understand basic principles of dietary content, such as calories, fat, sodium, cholesterol and nutrients.;Long Term Goal: Adherence to prescribed nutrition plan.      Nutrition Discharge: Nutrition Scores:     Nutrition Assessments - 04/04/16 1123      MEDFICTS Scores   Pre Score 30      Nutrition Goals Re-Evaluation:   Nutrition Goals Re-Evaluation:   Nutrition Goals Discharge (Final Nutrition Goals Re-Evaluation):   Psychosocial: Target Goals: Acknowledge presence or absence of significant depression and/or stress, maximize coping skills, provide positive support system. Participant is able to verbalize types and ability to use techniques and skills needed for reducing stress and depression.  Initial Review & Psychosocial Screening:     Initial Psych Review & Screening - 04/03/16 1616      Initial Review   Current issues with None Identified     Family Dynamics   Good Support System? Yes     Barriers   Psychosocial barriers to participate in program There are no identifiable barriers or psychosocial needs.     Screening Interventions   Interventions Encouraged to exercise      Quality of Life Scores:     Quality of Life  - 04/03/16 0844      Quality of Life Scores   Health/Function Pre 25.96 %   Socioeconomic Pre 25.14 %   Psych/Spiritual Pre 28.75 %   Family Pre 27.6 %   GLOBAL Pre 26.56 %      PHQ-9: Recent Review Flowsheet Data    Depression screen [facility] 2/9 04/09/2016   Decreased Interest 0   Down, Depressed, Hopeless 0   PHQ - 2 Score 0     Interpretation of Total Score  Total Score Depression Severity:  1-4 = Minimal depression, 5-9 = Mild depression, 10-14 = Moderate depression, 15-19 = Moderately severe depression, 20-27 = Severe depression   Psychosocial Evaluation and Intervention:   Psychosocial Re-Evaluation:     Psychosocial Re-Evaluation    [place] Name 05/02/16 1820 05/29/16 0913 06/26/16 1006         Psychosocial Re-Evaluation   Current issues with None Identified None Identified None Identified     Interventions Encouraged to attend Cardiac Rehabilitation for the exercise Encouraged to attend Cardiac Rehabilitation for the exercise Encouraged to attend Cardiac Rehabilitation for the exercise     Continue Psychosocial Services  No Follow up required No Follow up required No Follow up required        Psychosocial Discharge (Final Psychosocial Re-Evaluation):     Psychosocial Re-Evaluation - 06/26/16 1006      Psychosocial Re-Evaluation   Current issues with None Identified   Interventions Encouraged to attend Cardiac Rehabilitation for the exercise   Continue Psychosocial Services  No Follow up required      Vocational Rehabilitation: Provide vocational rehab assistance to qualifying candidates.   Vocational Rehab Evaluation & Intervention:     Vocational Rehab - 04/03/16 1614      Initial Vocational Rehab Evaluation & Intervention   Assessment shows need for Vocational Rehabilitation No  Mr Bonner is retired and does not need vocational rehab a tthis time.      Education: Education Goals:  Education classes will be provided on a weekly basis, covering  required topics. Participant will state understanding/return demonstration of topics presented.  Learning Barriers/Preferences:     Learning Barriers/Preferences - 04/03/16 0836      Learning Barriers/Preferences   Learning Barriers Sight   Learning Preferences Written Material;Verbal Instruction;Skilled Demonstration;Video;Pictoral      Education Topics: Count Your Pulse:  -Group instruction provided by verbal instruction, demonstration, patient participation and written materials to support subject.  Instructors address importance of being able to find your pulse and how to count your pulse when at home without a heart monitor.  Patients get hands on experience counting their pulse with staff help and individually.   Heart Attack, Angina, and Risk Factor Modification:  -Group instruction provided by verbal instruction, video, and written materials to support subject.  Instructors address signs and symptoms of angina and heart attacks.    Also discuss risk factors for heart disease and how to make changes to improve heart health risk factors.   Functional Fitness:  -Group instruction provided by verbal instruction, demonstration, patient participation, and written materials to support subject.  Instructors address safety measures for doing things around the house.  Discuss how to get up and down off the floor, how to pick things up properly, how to safely get out of a chair without assistance, and balance training.   CARDIAC REHAB PHASE II EXERCISE from 05/16/2016 in [place]  Date  04/27/16  Instruction Review Code  2- meets goals/outcomes      Meditation and Mindfulness:  -Group instruction provided by verbal instruction, patient participation, and written materials to support subject.  Instructor addresses importance of mindfulness and meditation practice to help reduce stress and improve awareness.  Instructor also leads participants through a  meditation exercise.    CARDIAC REHAB PHASE II EXERCISE from 05/16/2016 in [facility]  Date  04/25/16  Educator  Jeanella Craze  Instruction Review Code  2- meets goals/outcomes      Stretching for Flexibility and Mobility:  -Group instruction provided by verbal instruction, patient participation, and written materials to support subject.  Instructors lead participants through series of stretches that are designed to increase flexibility thus improving mobility.  These stretches are additional exercise for major muscle groups that are typically performed during regular warm up and cool down.   CARDIAC REHAB PHASE II EXERCISE from 05/16/2016 in [place]  Date  05/04/16  Instruction Review Code  2- meets goals/outcomes      Hands Only CPR:  -Group verbal, video, and participation provides a basic overview of AHA guidelines for community CPR. Role-play of emergencies allow participants the opportunity to practice calling for help and chest compression technique with discussion of AED use.   Hypertension: -Group verbal and written instruction that provides a basic overview of hypertension including the most recent diagnostic guidelines, risk factor reduction with self-care instructions and medication management.    Nutrition I class: Heart Healthy Eating:  -Group instruction provided by PowerPoint slides, verbal discussion, and written materials to support subject matter. The instructor gives an explanation and review of the Therapeutic Lifestyle Changes diet recommendations, which includes a discussion on lipid goals, dietary fat, sodium, fiber, plant stanol/sterol esters, sugar, and the components of a well-balanced, healthy diet.   CARDIAC REHAB PHASE II EXERCISE from 05/16/2016 in [place]  Date  04/10/16  Educator  RD  Instruction Review Code  2- meets goals/outcomes      Nutrition II class:  Lifestyle Skills:  -Group instruction provided by PowerPoint slides, verbal discussion, and written materials to support subject matter. The instructor gives an explanation and review of label reading, grocery shopping for heart health, heart healthy recipe modifications, and ways to make healthier choices when eating out.   CARDIAC REHAB PHASE II EXERCISE from 05/16/2016 in [place]  Date  05/15/16  Educator  RD  Instruction Review Code  2- meets goals/outcomes      Diabetes Question & Answer:  -Group instruction provided by PowerPoint slides, verbal discussion, and written materials to support subject matter. The instructor gives an explanation and review of diabetes co-morbidities, pre- and post-prandial blood glucose goals, pre-exercise blood glucose goals, signs, symptoms, and treatment of hypoglycemia and hyperglycemia, and foot care basics.   Diabetes Blitz:  -Group instruction provided by PowerPoint slides, verbal discussion, and written materials to support subject matter. The instructor gives an explanation and review of the physiology behind type 1 and type 2 diabetes, diabetes medications and rational behind using different medications, pre- and post-prandial blood glucose recommendations and Hemoglobin A1c goals, diabetes diet, and exercise including blood glucose guidelines for exercising safely.    Portion Distortion:  -Group instruction provided by PowerPoint slides, verbal discussion, written materials, and food models to support subject matter. The instructor gives an explanation of serving size versus portion size, changes in portions sizes over the last 20 years, and what consists of a serving from each food group.   CARDIAC REHAB PHASE II EXERCISE from 05/16/2016 in [place]  Date  05/03/16  Educator  RD  Instruction Review Code  2- meets goals/outcomes      Stress Management:  -Group instruction provided  by verbal instruction, video, and written materials to support subject matter.  Instructors review role of stress in heart disease and how to cope with stress positively.     CARDIAC REHAB PHASE II EXERCISE from 05/16/2016 in [place]  Date  05/09/16  Instruction Review Code  2- meets goals/outcomes      Exercising on Your Own:  -Group instruction provided by verbal instruction, power point, and written materials to support subject.  Instructors discuss benefits of exercise, components of exercise, frequency and intensity of exercise, and end points for exercise.  Also discuss use of nitroglycerin and activating EMS.  Review options of places to exercise outside of rehab.  Review guidelines for sex with heart disease.   Cardiac Drugs I:  -Group instruction provided by verbal instruction and written materials to support subject.  Instructor reviews cardiac drug classes: antiplatelets, anticoagulants, beta blockers, and statins.  Instructor discusses reasons, side effects, and lifestyle considerations for each drug class.   Cardiac Drugs II:  -Group instruction provided by verbal instruction and written materials to support subject.  Instructor reviews cardiac drug classes: angiotensin converting enzyme inhibitors (ACE-I), angiotensin II receptor blockers (ARBs), nitrates, and calcium channel blockers.  Instructor discusses reasons, side effects, and lifestyle considerations for each drug class.   CARDIAC REHAB PHASE II EXERCISE from 05/16/2016 in [place]  Date  05/16/16  Instruction Review Code  2- meets goals/outcomes      Anatomy and Physiology of the Circulatory System:  Group verbal and written instruction and models provide basic cardiac anatomy and physiology, with the coronary electrical and arterial systems. Review of: AMI, Angina, Valve disease, Heart  Failure, Peripheral Artery Disease, Cardiac Arrhythmia, Pacemakers,  and the ICD.   Other Education:  -Group or individual verbal, written, or video instructions that support the educational goals of the cardiac rehab program.   Knowledge Questionnaire Score:     Knowledge Questionnaire Score - 04/03/16 0844      Knowledge Questionnaire Score   Pre Score 20/24      Core Components/Risk Factors/Patient Goals at Admission:     Personal Goals and Risk Factors at Admission - 04/03/16 1621      Core Components/Risk Factors/Patient Goals on Admission    Weight Management Yes;Weight Maintenance;Weight Loss   Intervention Weight Management: Develop a combined nutrition and exercise program designed to reach desired caloric intake, while maintaining appropriate intake of nutrient and fiber, sodium and fats, and appropriate energy expenditure required for the weight goal.;Weight Management: Provide education and appropriate resources to help participant work on and attain dietary goals.;Weight Management/Obesity: Establish reasonable short term and long term weight goals.;Obesity: Provide education and appropriate resources to help participant work on and attain dietary goals.   Expected Outcomes Short Term: Continue to assess and modify interventions until short term weight is achieved;Long Term: Adherence to nutrition and physical activity/exercise program aimed toward attainment of established weight goal;Weight Maintenance: Understanding of the daily nutrition guidelines, which includes 25-35% calories from fat, 7% or less cal from saturated fats, less than 200mg  cholesterol, less than 1.5gm of sodium, & 5 or more servings of fruits and vegetables daily;Weight Loss: Understanding of general recommendations for a balanced deficit meal plan, which promotes 1-2 lb weight loss per week and includes a negative energy balance of [phone] kcal/d;Understanding recommendations for meals to include 15-35% energy as protein, 25-35% energy from fat, 35-60% energy from  carbohydrates, less than 200mg  of dietary cholesterol, 20-35 gm of total fiber daily;Understanding of distribution of calorie intake throughout the day with the consumption of 4-5 meals/snacks   Hypertension Yes   Intervention Monitor prescription use compliance.;Provide education on lifestyle modifcations including regular physical activity/exercise, weight management, moderate sodium restriction and increased consumption of fresh fruit, vegetables, and low fat dairy, alcohol moderation, and smoking cessation.   Expected Outcomes Short Term: Continued assessment and intervention until BP is < 140/30mm HG in hypertensive participants. < 130/80mm HG in hypertensive participants with diabetes, heart failure or chronic kidney disease.;Long Term: Maintenance of blood pressure at goal levels.   Lipids Yes   Intervention Provide education and support for participant on nutrition & aerobic/resistive exercise along with prescribed medications to achieve LDL 70mg , HDL >40mg .   Expected Outcomes Short Term: Participant states understanding of desired cholesterol values and is compliant with medications prescribed. Participant is following exercise prescription and nutrition guidelines.;Long Term: Cholesterol controlled with medications as prescribed, with individualized exercise RX and with personalized nutrition plan. Value goals: LDL < 70mg , HDL > 40 mg.      Core Components/Risk Factors/Patient Goals Review:    Core Components/Risk Factors/Patient Goals at Discharge (Final Review):    ITP Comments:     ITP Comments    Row Name 04/03/16 0814           ITP Comments Medical Director- Dr. Fransico Him, MD          Comments: Josua is making expected progress toward personal goals after completing 29 sessions. Recommend continued exercise and life style modification education including  stress management and relaxation techniques to decrease cardiac risk profile. Hillard continues to do well with  exercise at cardiac rehab and is  exercising at home.Barnet Pall, RN,BSN 06/27/2016 4:44 PM

## 2016-06-27 ENCOUNTER — Encounter (HOSPITAL_COMMUNITY)
Admission: RE | Admit: 2016-06-27 | Discharge: 2016-06-27 | Disposition: A | Discharge status: Home / Self Care | Payer: Medicare Other | Source: Ambulatory Visit | Attending: Cardiology | Admitting: Cardiology

## 2016-06-27 DIAGNOSIS — Z955 Presence of coronary angioplasty implant and graft: Secondary | ICD-10-CM | POA: Diagnosis not present

## 2016-06-29 ENCOUNTER — Encounter (HOSPITAL_COMMUNITY)
Admission: RE | Admit: 2016-06-29 | Discharge: 2016-06-29 | Disposition: A | Discharge status: Home / Self Care | Payer: Medicare Other | Source: Ambulatory Visit | Attending: Cardiology | Admitting: Cardiology

## 2016-06-29 DIAGNOSIS — Z955 Presence of coronary angioplasty implant and graft: Secondary | ICD-10-CM

## 2016-07-02 ENCOUNTER — Encounter (HOSPITAL_COMMUNITY)
Admission: RE | Admit: 2016-07-02 | Discharge: 2016-07-02 | Disposition: A | Discharge status: Home / Self Care | Payer: Medicare Other | Source: Ambulatory Visit | Attending: Cardiology | Admitting: Cardiology

## 2016-07-02 DIAGNOSIS — Z955 Presence of coronary angioplasty implant and graft: Secondary | ICD-10-CM | POA: Diagnosis not present

## 2016-07-04 ENCOUNTER — Encounter (HOSPITAL_COMMUNITY)
Admission: RE | Admit: 2016-07-04 | Discharge: 2016-07-04 | Disposition: A | Discharge status: Home / Self Care | Payer: Medicare Other | Source: Ambulatory Visit | Attending: Cardiology | Admitting: Cardiology

## 2016-07-04 VITALS — Ht 71.5 in | Wt 177.2 lb

## 2016-07-04 DIAGNOSIS — Z955 Presence of coronary angioplasty implant and graft: Secondary | ICD-10-CM

## 2016-07-04 NOTE — Progress Notes (Signed)
Discharge Summary  Patient Details  Name: Miguel Wolfe MRN: [id number] Date of Birth: 10/11/1945 Referring Provider:     CARDIAC REHAB PHASE II ORIENTATION from 04/03/2016 in [place]  Referring Provider  Candee Furbish MD       Number of Visits: 36  Reason for Discharge:  Patient independent in their exercise.  Smoking History:  History  Smoking Status  . Former Smoker  . Types: Cigarettes  . Quit date: 02/01/1967  Smokeless Tobacco  . Never Used    Diagnosis:  02/20/16 Status post coronary artery stent placement  ADL [facility]SD:   Initial Exercise Prescription:     Initial Exercise Prescription - 04/03/16 0900      Date of Initial Exercise RX and Referring Provider   Date 04/03/16   Referring Provider Candee Furbish MD     Treadmill   MPH 2.5   Grade 2   Minutes 10   METs 3.6     Bike   Level 1.3   Minutes 10   METs 3.98     NuStep   Level 3   SPM 70   Minutes 10   METs 3     Prescription Details   Frequency (times per week) 3   Duration Progress to 30 minutes of continuous aerobic without signs/symptoms of physical distress     Intensity   THRR 40-80% of Max Heartrate 61-122   Ratings of Perceived Exertion 11-13   Perceived Dyspnea 0-4     Progression   Progression Continue to progress workloads to maintain intensity without signs/symptoms of physical distress.     Resistance Training   Training Prescription Yes   Weight 3lbs   Reps 10-15      Discharge Exercise Prescription (Final Exercise Prescription Changes):     Exercise Prescription Changes - 07/16/16 1600      Response to Exercise   Blood Pressure (Admit) 122/70   Blood Pressure (Exercise) 124/60   Blood Pressure (Exit) 110/68   Heart Rate (Admit) 64 bpm   Heart Rate (Exercise) 108 bpm   Heart Rate (Exit) 73 bpm   Rating of Perceived Exertion (Exercise) 14   Duration Continue with 45 min of aerobic exercise without signs/symptoms of physical  distress.   Intensity THRR unchanged     Progression   Progression Continue to progress workloads to maintain intensity without signs/symptoms of physical distress.   Average METs 6.4     Resistance Training   Training Prescription Yes   Weight 0   Reps 10-15     Treadmill   MPH 4   Grade 8   Minutes 10   METs 8.47     Bike   Level 2.5   Minutes 10   METs 6.73     NuStep   Level 7   SPM 90   Minutes 10   METs 5.4     Home Exercise Plan   Plans to continue exercise at Home (comment)   Frequency Add 4 additional days to program exercise sessions.   Initial Home Exercises Provided 04/16/16      Functional Capacity:     6 Minute Walk    Row Name 04/03/16 0935 07/05/16 1655       6 Minute Walk   Phase Initial Discharge    Distance 1775 feet 2426 feet    Distance % Change  - 36.7 %    Walk Time 6 minutes 6 minutes    # of  Rest Breaks 0 0    MPH 3.36 4.6    METS 3.95 5.5    RPE 11 11    VO2 Peak 13.83 19.1    Symptoms No No    Resting HR 70 bpm 74 bpm    Resting BP 133/74 136/74    Max Ex. HR 88 bpm 112 bpm    Max Ex. BP 132/62 142/82    2 Minute Post BP 118/64 122/78       Psychological, QOL, Others - Outcomes: PHQ 2/9: Depression screen [facility] 2/9 07/26/2016 04/09/2016  Decreased Interest 0 0  Down, Depressed, Hopeless 0 0  PHQ - 2 Score 0 0    Quality of Life:     Quality of Life - 07/05/16 1656      Quality of Life Scores   Health/Function Pre 25.96 %   Health/Function Post 29.03 %   Health/Function % Change 11.83 %   Socioeconomic Pre 25.14 %   Socioeconomic Post 29.29 %   Socioeconomic % Change  16.51 %   Psych/Spiritual Pre 28.75 %   Psych/Spiritual Post 30 %   Psych/Spiritual % Change 4.35 %   Family Pre 27.6 %   Family Post 30 %   Family % Change 8.7 %   GLOBAL Pre 26.56 %   GLOBAL Post 29.43 %   GLOBAL % Change 10.81 %      Personal Goals: Goals established at orientation with interventions provided to work toward goal.      Personal Goals and Risk Factors at Admission - 04/03/16 1621      Core Components/Risk Factors/Patient Goals on Admission    Weight Management Yes;Weight Maintenance;Weight Loss   Intervention Weight Management: Develop a combined nutrition and exercise program designed to reach desired caloric intake, while maintaining appropriate intake of nutrient and fiber, sodium and fats, and appropriate energy expenditure required for the weight goal.;Weight Management: Provide education and appropriate resources to help participant work on and attain dietary goals.;Weight Management/Obesity: Establish reasonable short term and long term weight goals.;Obesity: Provide education and appropriate resources to help participant work on and attain dietary goals.   Expected Outcomes Short Term: Continue to assess and modify interventions until short term weight is achieved;Long Term: Adherence to nutrition and physical activity/exercise program aimed toward attainment of established weight goal;Weight Maintenance: Understanding of the daily nutrition guidelines, which includes 25-35% calories from fat, 7% or less cal from saturated fats, less than 231m cholesterol, less than 1.5gm of sodium, & 5 or more servings of fruits and vegetables daily;Weight Loss: Understanding of general recommendations for a balanced deficit meal plan, which promotes 1-2 lb weight loss per week and includes a negative energy balance of [phone] kcal/d;Understanding recommendations for meals to include 15-35% energy as protein, 25-35% energy from fat, 35-60% energy from carbohydrates, less than 2034mof dietary cholesterol, 20-35 gm of total fiber daily;Understanding of distribution of calorie intake throughout the day with the consumption of 4-5 meals/snacks   Hypertension Yes   Intervention Monitor prescription use compliance.;Provide education on lifestyle modifcations including regular physical activity/exercise, weight management, moderate  sodium restriction and increased consumption of fresh fruit, vegetables, and low fat dairy, alcohol moderation, and smoking cessation.   Expected Outcomes Short Term: Continued assessment and intervention until BP is < 140/9030mG in hypertensive participants. < 130/60m67m in hypertensive participants with diabetes, heart failure or chronic kidney disease.;Long Term: Maintenance of blood pressure at goal levels.   Lipids Yes   Intervention Provide education and  support for participant on nutrition & aerobic/resistive exercise along with prescribed medications to achieve LDL <98m, HDL >434m   Expected Outcomes Short Term: Participant states understanding of desired cholesterol values and is compliant with medications prescribed. Participant is following exercise prescription and nutrition guidelines.;Long Term: Cholesterol controlled with medications as prescribed, with individualized exercise RX and with personalized nutrition plan. Value goals: LDL < 7062mHDL > 40 mg.       Personal Goals Discharge:     Goals and Risk Factor Review    Row Name 07/20/16 0944             Core Components/Risk Factors/Patient Goals Review   Personal Goals Review Weight Management/Obesity       Review Pt wt 175 lb at discharge. Pt long-term wt loss goal wt achieved.           Nutrition & Weight - Outcomes:     Pre Biometrics - 04/03/16 0936      Pre Biometrics   Height 5' 11.5" (1.816 m)   Weight 183 lb 3.2 oz (83.1 kg)   Waist Circumference 39 inches   Hip Circumference 39 inches   Waist to Hip Ratio 1 %   BMI (Calculated) 25.2   Triceps Skinfold 19 mm   % Body Fat 26.8 %   Grip Strength 43 kg   Flexibility 0 in   Single Leg Stand 30 seconds         Post Biometrics - 07/26/16 1017       Post  Biometrics   Height 5' 11.5" (1.816 m)   Weight 177 lb 4 oz (80.4 kg)   Waist Circumference 37 inches   Hip Circumference 39.5 inches   Waist to Hip Ratio 0.94 %   BMI (Calculated) 24.4    Triceps Skinfold 12 mm   % Body Fat 23.7 %   Grip Strength 52.5 kg   Flexibility 0 in   Single Leg Stand 30 seconds      Nutrition:     Nutrition Therapy & Goals - 04/04/16 1123      Nutrition Therapy   Diet Therapeutic Lifestyle Changes     Personal Nutrition Goals   Nutrition Goal Wt loss of 1-2 lb/week to a wt loss goal of 6-8 lb at [facility] and [facility] regarding individualized specific dietary modifications aiming towards targeted core components such as weight, hypertension, lipid management, diabetes, heart failure and other comorbidities.   Expected Outcomes Short Term Goal: Understand basic principles of dietary content, such as calories, fat, sodium, cholesterol and nutrients.;Long Term Goal: Adherence to prescribed nutrition plan.      Nutrition Discharge:     Nutrition Assessments - 07/20/16 0944      MEDFICTS Scores   Pre Score 30   Post Score 13   Score Difference -17      Education Questionnaire Score:     Knowledge Questionnaire Score - 07/05/16 1655      Knowledge Questionnaire Score   Post Score 22/24     Goals reviewed with patient; copy given to patient. [place] [facility] with completion of 36 exercise sessions in Phase II. Pt maintained good attendance and progressed nicely during his participation in rehab as evidenced by increased MET level.   Medication list reconciled. Repeat  PHQ score- 0 .  Pt has made significant lifestyle changes and should be commended for his success. Pt  feels he has achieved his goals during cardiac rehab. Tadan did great with exercise and weight loss.  Pt plans to continue exercise at the [facility] in [place]. We are proud of Nels's progress.Barnet Pall, RN,BSN 07/26/2016 10:19 AM

## 2016-07-06 ENCOUNTER — Encounter (HOSPITAL_COMMUNITY)
Admission: RE | Admit: 2016-07-06 | Discharge: 2016-07-06 | Disposition: A | Discharge status: Home / Self Care | Payer: Medicare Other | Source: Ambulatory Visit | Attending: Cardiology | Admitting: Cardiology

## 2016-07-06 DIAGNOSIS — Z955 Presence of coronary angioplasty implant and graft: Secondary | ICD-10-CM

## 2016-07-09 ENCOUNTER — Encounter (HOSPITAL_COMMUNITY): Payer: Medicare Other

## 2016-07-09 ENCOUNTER — Encounter (HOSPITAL_COMMUNITY)
Admission: RE | Admit: 2016-07-09 | Discharge: 2016-07-09 | Disposition: A | Discharge status: Home / Self Care | Payer: Medicare Other | Source: Ambulatory Visit | Attending: Cardiology | Admitting: Cardiology

## 2016-07-09 DIAGNOSIS — Z955 Presence of coronary angioplasty implant and graft: Secondary | ICD-10-CM

## 2016-07-11 ENCOUNTER — Encounter (HOSPITAL_COMMUNITY): Payer: Medicare Other

## 2016-07-13 ENCOUNTER — Encounter (HOSPITAL_COMMUNITY)
Admission: RE | Admit: 2016-07-13 | Discharge: 2016-07-13 | Disposition: A | Discharge status: Home / Self Care | Payer: Medicare Other | Source: Ambulatory Visit | Attending: Cardiology | Admitting: Cardiology

## 2016-07-13 ENCOUNTER — Encounter (HOSPITAL_COMMUNITY): Payer: Medicare Other

## 2016-07-13 VITALS — Wt 175.3 lb

## 2016-07-13 DIAGNOSIS — Z955 Presence of coronary angioplasty implant and graft: Secondary | ICD-10-CM | POA: Diagnosis not present

## 2016-07-16 ENCOUNTER — Encounter (HOSPITAL_COMMUNITY): Payer: Medicare Other

## 2016-07-18 ENCOUNTER — Encounter (HOSPITAL_COMMUNITY): Payer: Medicare Other

## 2016-07-20 ENCOUNTER — Encounter (HOSPITAL_COMMUNITY): Payer: Medicare Other

## 2016-07-23 ENCOUNTER — Encounter (HOSPITAL_COMMUNITY): Payer: Medicare Other

## 2016-07-25 ENCOUNTER — Encounter (HOSPITAL_COMMUNITY): Payer: Medicare Other

## 2016-07-26 NOTE — Addendum Note (Signed)
Encounter addended by: Magda Kiel, RN on: 07/26/2016 10:23 AM<BR>    Actions taken: Flowsheet data copied forward, Visit Navigator Flowsheet section accepted, Sign clinical note

## 2016-07-27 ENCOUNTER — Encounter (HOSPITAL_COMMUNITY): Payer: Medicare Other

## 2016-07-30 ENCOUNTER — Encounter (HOSPITAL_COMMUNITY): Payer: Medicare Other

## 2016-08-01 ENCOUNTER — Encounter (HOSPITAL_COMMUNITY): Payer: Medicare Other

## 2016-08-03 ENCOUNTER — Encounter (HOSPITAL_COMMUNITY): Payer: Medicare Other

## 2016-08-06 ENCOUNTER — Encounter (HOSPITAL_COMMUNITY): Payer: Medicare Other

## 2016-08-10 DIAGNOSIS — I10 Essential (primary) hypertension: Secondary | ICD-10-CM | POA: Diagnosis not present

## 2016-08-10 DIAGNOSIS — K219 Gastro-esophageal reflux disease without esophagitis: Secondary | ICD-10-CM | POA: Diagnosis not present

## 2016-08-10 DIAGNOSIS — D229 Melanocytic nevi, unspecified: Secondary | ICD-10-CM | POA: Diagnosis not present

## 2016-08-10 DIAGNOSIS — Z86010 Personal history of colonic polyps: Secondary | ICD-10-CM | POA: Diagnosis not present

## 2016-08-10 DIAGNOSIS — R7301 Impaired fasting glucose: Secondary | ICD-10-CM | POA: Diagnosis not present

## 2016-08-10 DIAGNOSIS — I2510 Atherosclerotic heart disease of native coronary artery without angina pectoris: Secondary | ICD-10-CM | POA: Diagnosis not present

## 2016-08-10 DIAGNOSIS — E782 Mixed hyperlipidemia: Secondary | ICD-10-CM | POA: Diagnosis not present

## 2016-08-10 DIAGNOSIS — Z125 Encounter for screening for malignant neoplasm of prostate: Secondary | ICD-10-CM | POA: Diagnosis not present

## 2016-08-10 DIAGNOSIS — Z0000 Encounter for general adult medical examination without abnormal findings: Secondary | ICD-10-CM | POA: Diagnosis not present

## 2016-08-10 DIAGNOSIS — G4700 Insomnia, unspecified: Secondary | ICD-10-CM | POA: Diagnosis not present

## 2016-08-13 ENCOUNTER — Encounter: Payer: Self-pay | Admitting: Cardiology

## 2016-08-15 ENCOUNTER — Encounter: Payer: Self-pay | Admitting: Cardiology

## 2016-08-15 ENCOUNTER — Other Ambulatory Visit: Payer: Self-pay | Admitting: *Deleted

## 2016-08-15 DIAGNOSIS — E785 Hyperlipidemia, unspecified: Secondary | ICD-10-CM

## 2016-08-15 MED ORDER — PRAVASTATIN SODIUM 80 MG PO TABS: 80.0000 mg | ORAL_TABLET | Freq: Every evening | ORAL | 6 refills | Status: DC

## 2016-08-15 NOTE — Progress Notes (Signed)
RX sent in at pt's request

## 2016-09-01 ENCOUNTER — Other Ambulatory Visit: Payer: Self-pay | Admitting: Physician Assistant

## 2016-09-06 ENCOUNTER — Encounter: Payer: Self-pay | Admitting: Cardiology

## 2016-10-29 DIAGNOSIS — Z23 Encounter for immunization: Secondary | ICD-10-CM | POA: Diagnosis not present

## 2016-11-20 ENCOUNTER — Encounter: Payer: Self-pay | Admitting: Cardiology

## 2016-11-20 ENCOUNTER — Ambulatory Visit (INDEPENDENT_AMBULATORY_CARE_PROVIDER_SITE_OTHER): Payer: Medicare Other | Admitting: Cardiology

## 2016-11-20 VITALS — BP 146/70 | HR 68 | Ht 71.0 in | Wt 173.6 lb

## 2016-11-20 DIAGNOSIS — I10 Essential (primary) hypertension: Secondary | ICD-10-CM | POA: Diagnosis not present

## 2016-11-20 DIAGNOSIS — I2510 Atherosclerotic heart disease of native coronary artery without angina pectoris: Secondary | ICD-10-CM | POA: Diagnosis not present

## 2016-11-20 DIAGNOSIS — Z79899 Other long term (current) drug therapy: Secondary | ICD-10-CM | POA: Diagnosis not present

## 2016-11-20 DIAGNOSIS — E7800 Pure hypercholesterolemia, unspecified: Secondary | ICD-10-CM | POA: Diagnosis not present

## 2016-11-20 MED ORDER — ROSUVASTATIN CALCIUM 20 MG PO TABS: 20.0000 mg | ORAL_TABLET | Freq: Every day | ORAL | 3 refills | Status: DC

## 2016-11-20 NOTE — Patient Instructions (Signed)
Medication Instructions:  Please discontinue your Pravastatin and start Crestor 20 mg a day. Continue all other medications as listed.  Labwork: Please return in 2 months for fasting lab work (Lipid/ALT)  Follow-Up: Follow up in 6 months with Dr. Marlou Porch.  You will receive a letter in the mail 2 months before you are due.  Please call us when you receive this letter to schedule your follow up appointment.  If you need a refill on your cardiac medications before your next appointment, please call your pharmacy.  Thank you for choosing [facility]!!

## 2016-11-20 NOTE — Progress Notes (Signed)
Cardiology Office Note    Date:  11/20/2016   ID:  Miguel Wolfe, DOB [birth year], MRN [id number]  PCP:  [name], MD  Cardiologist:   Candee Furbish, MD     History of Present Illness:  Miguel Wolfe is a 71 y.o. male here for follow-up of PCI DES to [facility] on 02/20/16 after abnormal nuclear stress test showed inferolateral MI with peri-infarct ischemia, EF 51% on nuclear, 55% on echocardiogram. This was done secondary to dyspnea on exertion and performing yard work for instance. Was experiencing several months of fatigue, shortness of breath, energy decrease especially with yard work. Legs hurt bilaterally as well, constant, moderate, calf. One hour of yard work challenging. Interestingly however he is able to walk normally throughout the neighborhood without any symptoms. Flushing. He is not taking any niacin supplementation. ED as well.  His father had coronary artery disease and he also carries the diagnosis of hypertension as well as hyperlipidemia. He is not a smoker. Weight has been under control.  He was also complaining of some myalgias and pravastatin was stopped, did not seem to help. Restarted and may have felt worse. Now off. Now back on and feels no different. Excellent bounding pulses distally. CK was checked and normal. He did smoke but quit cigarettes in 1982. His father at age 54 years old had an myocardial infarction and subsequently has had bypass and stenting still alive. Mother valve issue.   He has had elevated glucose in the past and has been treated with Actos formally.  Hemoglobin 16.3 platelets 163, glucose 107, creatinine 0.81, total cholesterol 200, triglycerides 207, LDL 116, HDL 42, TSH 1.5  EKG personally viewed from 12/29/15 shows sinus rhythm heart rate 63 with no other abnormalities.  He used to work in the past for [employer] in [place].  11/20/16-between our visits, he did message me to see if it was okay to take a brief  course of ibuprofen because of back pain.,  He finished cardiac rehab in July 2018.  Remember, had DES to mid RCA on 02/2016 after an abnormal nuclear stress test.  EF is normal.  Previously had angina prior to PCI with yard work.  His father had history of MI. Bigest complaint insomnia. Tried melatonin.    Past Medical History:  Diagnosis Date  . Colon polyps   . Coronary artery disease involving native coronary artery of native heart without angina pectoris 04/23/2016   Myoview 02/08/16: EF 51, inf-lat, apical lateral scar with peri-infarct ischemia; intermediate risk  // LHC 2/18: dLAD 20, oD1 20, pLCx 20, pRCA 80, mRCA 20, dRCA 30, AM 90 >> PCI: 4 x 24 mm Synergy DES to pRCA   . Fatigue   . GERD (gastroesophageal reflux disease)   . Hemorrhoids   . History of echocardiogram    Echo 02/08/16: Mild LVH, EF 55-60, no RWMA, Gr 1 DD, trivial AI, mild LAE  . Hyperlipidemia   . Hypertension     Past Surgical History:  Procedure Laterality Date  . CARDIAC CATHETERIZATION    . COLONOSCOPY WITH ESOPHAGOGASTRODUODENOSCOPY (EGD)  2008  . PILONIDAL CYCT    . REFRACTIVE SURGERY      Current Medications: Outpatient Medications Prior to Visit  Medication Sig Dispense Refill  . amLODipine (NORVASC) 10 MG tablet Take 10 mg by mouth daily.    Marland Kitchen aspirin EC 81 MG tablet Take 81 mg by mouth every evening.     . calcium carbonate (TUMS -  DOSED IN MG ELEMENTAL CALCIUM) 500 MG chewable tablet Chew 2 tablets by mouth daily as needed for indigestion or heartburn.    . clopidogrel (PLAVIX) 75 MG tablet Take 1 tablet (75 mg total) by mouth daily. 90 tablet 3  . ibuprofen (ADVIL,MOTRIN) 200 MG tablet Take 200 mg by mouth 2 (two) times daily as needed for headache or moderate pain.    Marland Kitchen losartan (COZAAR) 100 MG tablet Take 100 mg by mouth daily.  0  . nitroGLYCERIN (NITROSTAT) 0.4 MG SL tablet Place 1 tablet (0.4 mg total) under the tongue every 5 (five) minutes as needed for chest pain. 25 tablet 12  . omega-3  acid ethyl esters (LOVAZA) 1 g capsule Take 2 g by mouth 2 (two) times daily.    . pantoprazole (PROTONIX) 40 MG tablet TAKE 1 TABLET(40 MG) BY MOUTH DAILY 90 tablet 2  . propranolol (INDERAL) 20 MG tablet Take 20 mg by mouth daily.  2  . pravastatin (PRAVACHOL) 80 MG tablet Take 1 tablet (80 mg total) by mouth every evening. 30 tablet 6  . zolpidem (AMBIEN) 10 MG tablet Take 3 mg by mouth at bedtime as needed for sleep.      No facility-administered medications prior to visit.      Allergies:   Gemfibrozil; Niaspan [niacin er]; and Simvastatin   Social History   Socioeconomic History  . Marital status: Married    Spouse name: None  . Number of children: None  . Years of education: None  . Highest education level: None  Social Needs  . Financial resource strain: None  . Food insecurity - worry: None  . Food insecurity - inability: None  . Transportation needs - medical: None  . Transportation needs - non-medical: None  Occupational History  . None  Tobacco Use  . Smoking status: Former Smoker    Types: Cigarettes    Last attempt to quit: 02/01/1967    Years since quitting: 49.8  . Smokeless tobacco: Never Used  Substance and Sexual Activity  . Alcohol use: Yes    Comment: a drink before dinner a few times a week  . Drug use: No  . Sexual activity: None  Other Topics Concern  . None  Social History Narrative  . None     Family History:  The patient's family history includes Colon cancer in his paternal uncle; Crohn's disease in his sister; Healthy in his sister; Heart attack (age of onset: 32) in his father.   ROS:   Please see the history of present illness.    Review of Systems  All other systems reviewed and are negative.    PHYSICAL EXAM:   VS:  BP (!) 146/70   Pulse 68   Ht 5\' 11"  (1.803 m)   Wt 173 lb 9.6 oz (78.7 kg)   BMI 24.21 kg/m    GEN: Well nourished, well developed, in no acute distress  HEENT: normal  Neck: no JVD, carotid bruits, or  masses Cardiac: RRR; 1/6 S murmur, no rubs, or gallops,no edema  Respiratory:  clear to auscultation bilaterally, normal work of breathing GI: soft, nontender, nondistended, + BS MS: no deformity or atrophy  Skin: warm and dry, no rash Neuro:  Alert and Oriented x 3, Strength and sensation are intact Psych: euthymic mood, full affect    Wt Readings from Last 3 Encounters:  11/20/16 173 lb 9.6 oz (78.7 kg)  07/16/16 175 lb 4.3 oz (79.5 kg)  07/26/16 177 lb 4 oz (  80.4 kg)      Studies/Labs Reviewed:   EKG:  Prior EKG from December reviewed as above, normal sinus rhythm with no other abnormalities. EKG ordered today on 02/06/16 shows sinus rhythm 70 with possible left ventricular hypertrophy. No ST segment changes.  Recent Labs: 04/24/2016: ALT 21; BUN 17; Creatinine, Ser 0.85; Hemoglobin 15.2; Platelets 162; Potassium 4.1; Sodium 143   Lipid Panel No results found for: CHOL, TRIG, HDL, CHOLHDL, VLDL, LDLCALC, LDLDIRECT  Additional studies/ records that were reviewed today include:  Former office notes, lab work, EKG reviewed  [place] 02/08/16  The patient walked for 7:00 of a standard Bruce protocol GXT . Peak HR of 153 which is 100% predicted maximal HR .  There were no ST or T wave changes to suggest ischemia. BP response to exercise was normal .  Defect 1: There is a medium defect of moderate severity present in the basal inferolateral, mid inferolateral and apical lateral location. This is consistent with a prior inferolateral MI with peri-infarct ischemia.  The left ventricular ejection fraction is mildly decreased (45-54%). Nuclear stress EF: 51%.  This is an intermediate risk study.   ECHO 02/08/16  - Left ventricle: The cavity size was normal. Wall thickness was   increased in a pattern of mild LVH. Systolic function was normal.   The estimated ejection fraction was in the range of 55% to 60%.   Wall motion was normal; there were no regional wall motion    abnormalities. Doppler parameters are consistent with abnormal   left ventricular relaxation (grade 1 diastolic dysfunction). - Aortic valve: There was trivial regurgitation. - Left atrium: The atrium was mildly dilated.  Cath 02/20/16:  Acute Mrg lesion, 90 %stenosed.  Mid RCA lesion, 20 %stenosed.  Dist RCA lesion, 30 %stenosed.  Prox Cx to Mid Cx lesion, 20 %stenosed.  1st Diag lesion, 20 %stenosed.  Dist LAD lesion, 20 %stenosed.  A STENT SYNERGY DES [id number] drug eluting stent was successfully placed.  Prox RCA to Mid RCA lesion, 80 %stenosed.  Post intervention, there is a 0% residual stenosis.   1. Severe single vessel CAD (severe stenosis mid RCA) 2. Successful PTCA/DES x 1 mid RCA  3. Mild non-obstructive disease Circumflex and LAD  Recommendations: Will continue DAPT with ASA and Plavix for one year. If he needs to interrupt anti-platelet therapy for a toe surgery, this could be considered at 3 months since a Synergy DES was placed, although it would be optimal to continue for at least 6 months. Will discharge home today as part of the same day PCI protocol. Will changed Prilosec to Protonix 40 mg daily. He will be started on Plavix 75 mg daily.  ASSESSMENT:    1. Coronary artery disease involving native coronary artery of native heart without angina pectoris   2. Essential hypertension   3. Pure hypercholesterolemia   4. Long-term use of high-risk medication      PLAN:  In order of problems listed above:  CAD  - DES to [facility] on 02/20/16 after NUC stress.  - EF normal  - Had angina prior to PCI with yard work.   -He has completed cardiac rehab.  Doing well.  -In February, he may come off of his dual antiplatelet therapy and continue with aspirin.  Family history of CAD/MI  - Note his father has history of MI/CAD.  -  Occasional periodic flushing, not associated with any new supplements. He has not had any previous history of being hyper vagal,  no prior syncopal  episodes.  This has been stable.  Essential hypertension  - Medications reviewed. Well-controlled per Miguel Wolfe. Doing very well.  Stable.  Hyperlipidemia  -We will change him over to Crestor 20 mg once a day, high intensity statin to help lower his LDL even more from 77.  Previously he thought he may have had some trouble with pravastatin and this was stopped then restarted than doubled.  I would like for him to be on a high intensity statin.  Let us see how he does with Crestor.  We will recheck lipid profile in 2 months and ALT.  Insomnia/decreased libido -states that he is does not have any issues with erection, just decreased desire.  Times this is common following heart issues, continue to exercise, his insomnia may be related as well, could be outward sign of depression.   Medication Adjustments/Labs and Tests Ordered: Current medicines are reviewed at length with the patient today.  Concerns regarding medicines are outlined above.  Medication changes, Labs and Tests ordered today are listed in the Patient Instructions below. Patient Instructions  Medication Instructions:  Please discontinue your Pravastatin and start Crestor 20 mg a day. Continue all other medications as listed.  Labwork: Please return in 2 months for fasting lab work (Lipid/ALT)  Follow-Up: Follow up in 6 months with Dr. Marlou Porch.  You will receive a letter in the mail 2 months before you are due.  Please call us when you receive this letter to schedule your follow up appointment.  If you need a refill on your cardiac medications before your next appointment, please call your pharmacy.  Thank you for choosing [facility]!!        Signed, Candee Furbish, MD  11/20/2016 9:31 AM    [facility] [place], [place], [place]  [address] Phone: [phone]; Fax: [phone]

## 2017-01-18 DIAGNOSIS — D225 Melanocytic nevi of trunk: Secondary | ICD-10-CM | POA: Diagnosis not present

## 2017-01-18 DIAGNOSIS — Z85828 Personal history of other malignant neoplasm of skin: Secondary | ICD-10-CM | POA: Diagnosis not present

## 2017-01-18 DIAGNOSIS — L570 Actinic keratosis: Secondary | ICD-10-CM | POA: Diagnosis not present

## 2017-01-18 DIAGNOSIS — Z23 Encounter for immunization: Secondary | ICD-10-CM | POA: Diagnosis not present

## 2017-01-18 DIAGNOSIS — D485 Neoplasm of uncertain behavior of skin: Secondary | ICD-10-CM | POA: Diagnosis not present

## 2017-01-18 DIAGNOSIS — C44319 Basal cell carcinoma of skin of other parts of face: Secondary | ICD-10-CM | POA: Diagnosis not present

## 2017-01-18 DIAGNOSIS — Z86018 Personal history of other benign neoplasm: Secondary | ICD-10-CM | POA: Diagnosis not present

## 2017-01-21 ENCOUNTER — Other Ambulatory Visit: Payer: Medicare Other

## 2017-01-21 DIAGNOSIS — E7800 Pure hypercholesterolemia, unspecified: Secondary | ICD-10-CM

## 2017-01-21 DIAGNOSIS — Z79899 Other long term (current) drug therapy: Secondary | ICD-10-CM

## 2017-01-21 LAB — LIPID PANEL
CHOL/HDL RATIO: 3.1 ratio (ref 0.0–5.0)
Cholesterol, Total: 151 mg/dL (ref 100–199)
HDL: 48 mg/dL (ref >39)
LDL CALC: 68 mg/dL (ref 0–99)
TRIGLYCERIDES: 177 mg/dL — AB (ref 0–149)
VLDL Cholesterol Cal: 35 mg/dL (ref 5–40)

## 2017-01-21 LAB — ALT: ALT: 19 IU/L (ref 0–44)

## 2017-02-15 ENCOUNTER — Encounter: Payer: Self-pay | Admitting: Cardiology

## 2017-03-14 DIAGNOSIS — C44319 Basal cell carcinoma of skin of other parts of face: Secondary | ICD-10-CM | POA: Diagnosis not present

## 2017-03-19 DIAGNOSIS — L820 Inflamed seborrheic keratosis: Secondary | ICD-10-CM | POA: Diagnosis not present

## 2017-03-19 DIAGNOSIS — Z23 Encounter for immunization: Secondary | ICD-10-CM | POA: Diagnosis not present

## 2017-04-02 DIAGNOSIS — H524 Presbyopia: Secondary | ICD-10-CM | POA: Diagnosis not present

## 2017-04-02 DIAGNOSIS — I10 Essential (primary) hypertension: Secondary | ICD-10-CM | POA: Diagnosis not present

## 2017-04-02 DIAGNOSIS — H35033 Hypertensive retinopathy, bilateral: Secondary | ICD-10-CM | POA: Diagnosis not present

## 2017-05-09 DIAGNOSIS — S61412A Laceration without foreign body of left hand, initial encounter: Secondary | ICD-10-CM | POA: Diagnosis not present

## 2017-05-10 DIAGNOSIS — S61412D Laceration without foreign body of left hand, subsequent encounter: Secondary | ICD-10-CM | POA: Diagnosis not present

## 2017-05-24 DIAGNOSIS — R21 Rash and other nonspecific skin eruption: Secondary | ICD-10-CM | POA: Diagnosis not present

## 2017-05-24 DIAGNOSIS — S61412D Laceration without foreign body of left hand, subsequent encounter: Secondary | ICD-10-CM | POA: Diagnosis not present

## 2017-05-25 ENCOUNTER — Other Ambulatory Visit: Payer: Self-pay | Admitting: Physician Assistant

## 2017-05-27 DIAGNOSIS — M792 Neuralgia and neuritis, unspecified: Secondary | ICD-10-CM | POA: Diagnosis not present

## 2017-05-28 ENCOUNTER — Encounter: Payer: Self-pay | Admitting: Cardiology

## 2017-05-28 ENCOUNTER — Ambulatory Visit (INDEPENDENT_AMBULATORY_CARE_PROVIDER_SITE_OTHER): Payer: Medicare Other | Admitting: Cardiology

## 2017-05-28 VITALS — BP 112/70 | HR 62 | Ht 71.0 in | Wt 174.6 lb

## 2017-05-28 DIAGNOSIS — I10 Essential (primary) hypertension: Secondary | ICD-10-CM | POA: Diagnosis not present

## 2017-05-28 DIAGNOSIS — E7800 Pure hypercholesterolemia, unspecified: Secondary | ICD-10-CM | POA: Diagnosis not present

## 2017-05-28 DIAGNOSIS — I2510 Atherosclerotic heart disease of native coronary artery without angina pectoris: Secondary | ICD-10-CM

## 2017-05-28 NOTE — Progress Notes (Signed)
Cardiology Office Note    Date:  05/28/2017   ID:  KHAMARION BJELLAND, DOB [birth year], MRN [id number]  PCP:  [name], MD  Cardiologist:   Candee Furbish, MD     History of Present Illness:  Miguel Wolfe is a 72 y.o. male here for follow-up of PCI DES to [facility] on 02/20/16 after abnormal nuclear stress test showed inferolateral MI with peri-infarct ischemia, EF 51% on nuclear, 55% on echocardiogram. This was done secondary to dyspnea on exertion and performing yard work for instance. Was experiencing several months of fatigue, shortness of breath, energy decrease especially with yard work. Legs hurt bilaterally as well, constant, moderate, calf. One hour of yard work challenging. Interestingly however he is able to walk normally throughout the neighborhood without any symptoms. Flushing. He is not taking any niacin supplementation. ED as well.  His father had coronary artery disease and he also carries the diagnosis of hypertension as well as hyperlipidemia. He is not a smoker. Weight has been under control.  He was also complaining of some myalgias and pravastatin was stopped, did not seem to help. Restarted and may have felt worse. Now off. Now back on and feels no different. Excellent bounding pulses distally. CK was checked and normal. He did smoke but quit cigarettes in 1982. His father at age 38 years old had an myocardial infarction and subsequently has had bypass and stenting still alive. Mother valve issue.   He has had elevated glucose in the past and has been treated with Actos formally.  Hemoglobin 16.3 platelets 163, glucose 107, creatinine 0.81, total cholesterol 200, triglycerides 207, LDL 116, HDL 42, TSH 1.5  EKG personally viewed from 12/29/15 shows sinus rhythm heart rate 63 with no other abnormalities.  He used to work in the past for [employer] in [place].  11/20/16-between our visits, he did message me to see if it was okay to take a brief  course of ibuprofen because of back pain.,  He finished cardiac rehab in July 2018.  Remember, had DES to mid RCA on 02/2016 after an abnormal nuclear stress test.  EF is normal.  Previously had angina prior to PCI with yard work.  His father had history of MI. Bigest complaint insomnia. Tried melatonin.   05/28/2017-doing very well, no significant anginal symptoms.  Sometimes he may feel a slight twinge when he is doing yard work.  Continuing to monitor.  Blood pressures excellent cholesterol excellent.   Past Medical History:  Diagnosis Date  . Colon polyps   . Coronary artery disease involving native coronary artery of native heart without angina pectoris 04/23/2016   Myoview 02/08/16: EF 51, inf-lat, apical lateral scar with peri-infarct ischemia; intermediate risk  // LHC 2/18: dLAD 20, oD1 20, pLCx 20, pRCA 80, mRCA 20, dRCA 30, AM 90 >> PCI: 4 x 24 mm Synergy DES to pRCA   . Fatigue   . GERD (gastroesophageal reflux disease)   . Hemorrhoids   . History of echocardiogram    Echo 02/08/16: Mild LVH, EF 55-60, no RWMA, Gr 1 DD, trivial AI, mild LAE  . Hyperlipidemia   . Hypertension     Past Surgical History:  Procedure Laterality Date  . CARDIAC CATHETERIZATION    . COLONOSCOPY WITH ESOPHAGOGASTRODUODENOSCOPY (EGD)  2008  . CORONARY STENT INTERVENTION N/A 02/20/2016   Procedure: Coronary Stent Intervention;  Surgeon: Burnell Blanks, MD;  Location: [place] [facility];  Service: Cardiovascular;  Laterality: N/A;  .  LEFT HEART CATH AND CORONARY ANGIOGRAPHY N/A 02/20/2016   Procedure: Left Heart Cath and Coronary Angiography;  Surgeon: Burnell Blanks, MD;  Location: [place] [facility];  Service: Cardiovascular;  Laterality: N/A;  . PILONIDAL CYCT    . REFRACTIVE SURGERY      Current Medications: Outpatient Medications Prior to Visit  Medication Sig Dispense Refill  . amLODipine (NORVASC) 10 MG tablet Take 10 mg by mouth daily.    Marland Kitchen aspirin EC 81 MG tablet Take 81 mg by  mouth every evening.     . calcium carbonate (TUMS - DOSED IN MG ELEMENTAL CALCIUM) 500 MG chewable tablet Chew 2 tablets by mouth daily as needed for indigestion or heartburn.    Marland Kitchen ibuprofen (ADVIL,MOTRIN) 200 MG tablet Take 200 mg by mouth 2 (two) times daily as needed for headache or moderate pain.    . nitroGLYCERIN (NITROSTAT) 0.4 MG SL tablet Place 1 tablet (0.4 mg total) under the tongue every 5 (five) minutes as needed for chest pain. 25 tablet 12  . omega-3 acid ethyl esters (LOVAZA) 1 g capsule Take 2 g by mouth 2 (two) times daily.    . pantoprazole (PROTONIX) 40 MG tablet TAKE 1 TABLET(40 MG) BY MOUTH DAILY 90 tablet 1  . propranolol (INDERAL) 20 MG tablet Take 20 mg by mouth daily.  2  . rosuvastatin (CRESTOR) 20 MG tablet Take 20 mg by mouth daily.    Marland Kitchen zolpidem (AMBIEN) 10 MG tablet Take 10 mg by mouth at bedtime.  0  . clopidogrel (PLAVIX) 75 MG tablet Take 1 tablet (75 mg total) by mouth daily. (Patient not taking: Reported on 05/28/2017) 90 tablet 3  . losartan (COZAAR) 100 MG tablet Take 100 mg by mouth daily.  0  . rosuvastatin (CRESTOR) 20 MG tablet Take 1 tablet (20 mg total) daily by mouth. 90 tablet 3   No facility-administered medications prior to visit.      Allergies:   Gemfibrozil; Niaspan [niacin er]; and Simvastatin   Social History   Socioeconomic History  . Marital status: Married    Spouse name: Not on file  . Number of children: Not on file  . Years of education: Not on file  . Highest education level: Not on file  Occupational History  . Not on file  Social Needs  . Financial resource strain: Not on file  . Food insecurity:    Worry: Not on file    Inability: Not on file  . Transportation needs:    Medical: Not on file    Non-medical: Not on file  Tobacco Use  . Smoking status: Former Smoker    Types: Cigarettes    Last attempt to quit: 02/01/1967    Years since quitting: 50.3  . Smokeless tobacco: Never Used  Substance and Sexual Activity    . Alcohol use: Yes    Comment: a drink before dinner a few times a week  . Drug use: No  . Sexual activity: Not on file  Lifestyle  . Physical activity:    Days per week: Not on file    Minutes per session: Not on file  . Stress: Not on file  Relationships  . Social connections:    Talks on phone: Not on file    Gets together: Not on file    Attends religious service: Not on file    Active member of club or organization: Not on file    Attends meetings of clubs or organizations: Not on  file    Relationship status: Not on file  Other Topics Concern  . Not on file  Social History Narrative  . Not on file     Family History:  The patient's family history includes Colon cancer in his paternal uncle; Crohn's disease in his sister; Healthy in his sister; Heart attack (age of onset: 58) in his father.   ROS:   Please see the history of present illness.    Review of Systems  All other systems reviewed and are negative.    PHYSICAL EXAM:   VS:  BP 112/70   Pulse 62   Ht 5\' 11"  (1.803 m)   Wt 174 lb 9.6 oz (79.2 kg)   BMI 24.35 kg/m    GEN: Well nourished, well developed, in no acute distress  HEENT: normal  Neck: no JVD, carotid bruits, or masses Cardiac: RRR;  Soft systolic murmurs, rubs, or gallops,no edema  Respiratory:  clear to auscultation bilaterally, normal work of breathing GI: soft, nontender, nondistended, + BS MS: no deformity or atrophy  Skin: warm and dry, no rash Neuro:  Alert and Oriented x 3, Strength and sensation are intact Psych: euthymic mood, full affect     Wt Readings from Last 3 Encounters:  05/28/17 174 lb 9.6 oz (79.2 kg)  11/20/16 173 lb 9.6 oz (78.7 kg)  07/16/16 175 lb 4.3 oz (79.5 kg)      Studies/Labs Reviewed:   EKG: Today 05/28/2017 shows sinus rhythm 62 with no other abnormalities.  Personally reviewed prior EKG from December reviewed as above, normal sinus rhythm with no other abnormalities. EKG ordered today on 02/06/16 shows  sinus rhythm 70 with possible left ventricular hypertrophy. No ST segment changes.  Recent Labs: 01/21/2017: ALT 19   Lipid Panel    Component Value Date/Time   CHOL 151 01/21/2017 0840   TRIG 177 (H) 01/21/2017 0840   HDL 48 01/21/2017 0840   CHOLHDL 3.1 01/21/2017 0840   LDLCALC 68 01/21/2017 0840    Additional studies/ records that were reviewed today include:  Former office notes, lab work, EKG reviewed  [place] 02/08/16  The patient walked for 7:00 of a standard Bruce protocol GXT . Peak HR of 153 which is 100% predicted maximal HR .  There were no ST or T wave changes to suggest ischemia. BP response to exercise was normal .  Defect 1: There is a medium defect of moderate severity present in the basal inferolateral, mid inferolateral and apical lateral location. This is consistent with a prior inferolateral MI with peri-infarct ischemia.  The left ventricular ejection fraction is mildly decreased (45-54%). Nuclear stress EF: 51%.  This is an intermediate risk study.   ECHO 02/08/16  - Left ventricle: The cavity size was normal. Wall thickness was   increased in a pattern of mild LVH. Systolic function was normal.   The estimated ejection fraction was in the range of 55% to 60%.   Wall motion was normal; there were no regional wall motion   abnormalities. Doppler parameters are consistent with abnormal   left ventricular relaxation (grade 1 diastolic dysfunction). - Aortic valve: There was trivial regurgitation. - Left atrium: The atrium was mildly dilated.  Cath 02/20/16:  Acute Mrg lesion, 90 %stenosed.  Mid RCA lesion, 20 %stenosed.  Dist RCA lesion, 30 %stenosed.  Prox Cx to Mid Cx lesion, 20 %stenosed.  1st Diag lesion, 20 %stenosed.  Dist LAD lesion, 20 %stenosed.  A STENT SYNERGY DES [id number] drug eluting stent  was successfully placed.  Prox RCA to Mid RCA lesion, 80 %stenosed.  Post intervention, there is a 0% residual stenosis.   1. Severe  single vessel CAD (severe stenosis mid RCA) 2. Successful PTCA/DES x 1 mid RCA  3. Mild non-obstructive disease Circumflex and LAD  Recommendations: Will continue DAPT with ASA and Plavix for one year. If he needs to interrupt anti-platelet therapy for a toe surgery, this could be considered at 3 months since a Synergy DES was placed, although it would be optimal to continue for at least 6 months. Will discharge home today as part of the same day PCI protocol. Will changed Prilosec to Protonix 40 mg daily. He will be started on Plavix 75 mg daily.  ASSESSMENT:    1. Coronary artery disease involving native coronary artery of native heart without angina pectoris   2. Essential hypertension   3. Pure hypercholesterolemia      PLAN:  In order of problems listed above:  CAD  - DES to [facility] on 02/20/16 after abnormal NUC stress showing inferior infarct with peri-infarct ischemia.  - EF normal  - Had angina prior to PCI with yard work.  Has noted some slight twinges with yard work, he will keep an eye on this.  -He has completed cardiac rehab.  Doing well.  Walking the [employer] in [place], staying active.  -Now on aspirin only.  -Discussed other lesions, 20 to 30% and 90% sidebranch small vessel not amenable to stenting..  Family history of CAD/MI  - Note his father has history of MI/CAD.  Essential hypertension  - Medications reviewed. Well-controlled per [name].  No changes made.    Hyperlipidemia  -Doing well with Crestor 20 mg once a day, high intensity statin to help.  LDL now 68 and ALT is normal at 19  Insomnia/decreased libido -Previously states that he is does not have any issues with erection, just decreased desire.  Times this is common following heart issues, continue to exercise, his insomnia may be related as well, could be outward sign of depression.  Did not discuss during today's visit.   Medication Adjustments/Labs and Tests Ordered: Current medicines are  reviewed at length with the patient today.  Concerns regarding medicines are outlined above.  Medication changes, Labs and Tests ordered today are listed in the Patient Instructions below. Patient Instructions  Medication Instructions:  The current medical regimen is effective;  continue present plan and medications.  Follow-Up: Follow up in 1 year with Dr. Marlou Porch.  You will receive a letter in the mail 2 months before you are due.  Please call us when you receive this letter to schedule your follow up appointment.  If you need a refill on your cardiac medications before your next appointment, please call your pharmacy.  Thank you for choosing [facility]!!        Signed, Candee Furbish, MD  05/28/2017 10:04 AM    [address], [place],   [address] Phone: [phone]; Fax: [phone]

## 2017-05-28 NOTE — Patient Instructions (Signed)

## 2017-05-29 DIAGNOSIS — R51 Headache: Secondary | ICD-10-CM | POA: Diagnosis not present

## 2017-08-07 DIAGNOSIS — D2272 Melanocytic nevi of left lower limb, including hip: Secondary | ICD-10-CM | POA: Diagnosis not present

## 2017-08-07 DIAGNOSIS — D225 Melanocytic nevi of trunk: Secondary | ICD-10-CM | POA: Diagnosis not present

## 2017-08-07 DIAGNOSIS — Z85828 Personal history of other malignant neoplasm of skin: Secondary | ICD-10-CM | POA: Diagnosis not present

## 2017-08-07 DIAGNOSIS — Z86018 Personal history of other benign neoplasm: Secondary | ICD-10-CM | POA: Diagnosis not present

## 2017-08-07 DIAGNOSIS — L570 Actinic keratosis: Secondary | ICD-10-CM | POA: Diagnosis not present

## 2017-09-04 DIAGNOSIS — I10 Essential (primary) hypertension: Secondary | ICD-10-CM | POA: Diagnosis not present

## 2017-09-04 DIAGNOSIS — G4700 Insomnia, unspecified: Secondary | ICD-10-CM | POA: Diagnosis not present

## 2017-09-04 DIAGNOSIS — Z86010 Personal history of colonic polyps: Secondary | ICD-10-CM | POA: Diagnosis not present

## 2017-09-04 DIAGNOSIS — Z136 Encounter for screening for cardiovascular disorders: Secondary | ICD-10-CM | POA: Diagnosis not present

## 2017-09-04 DIAGNOSIS — Z0000 Encounter for general adult medical examination without abnormal findings: Secondary | ICD-10-CM | POA: Diagnosis not present

## 2017-09-04 DIAGNOSIS — K219 Gastro-esophageal reflux disease without esophagitis: Secondary | ICD-10-CM | POA: Diagnosis not present

## 2017-09-04 DIAGNOSIS — E782 Mixed hyperlipidemia: Secondary | ICD-10-CM | POA: Diagnosis not present

## 2017-09-04 DIAGNOSIS — Z125 Encounter for screening for malignant neoplasm of prostate: Secondary | ICD-10-CM | POA: Diagnosis not present

## 2017-09-04 DIAGNOSIS — R7301 Impaired fasting glucose: Secondary | ICD-10-CM | POA: Diagnosis not present

## 2017-09-04 DIAGNOSIS — I2510 Atherosclerotic heart disease of native coronary artery without angina pectoris: Secondary | ICD-10-CM | POA: Diagnosis not present

## 2017-09-04 DIAGNOSIS — D229 Melanocytic nevi, unspecified: Secondary | ICD-10-CM | POA: Diagnosis not present

## 2017-09-26 ENCOUNTER — Encounter: Payer: Self-pay | Admitting: Cardiology

## 2017-09-26 ENCOUNTER — Ambulatory Visit (INDEPENDENT_AMBULATORY_CARE_PROVIDER_SITE_OTHER): Payer: Medicare Other | Admitting: Cardiology

## 2017-09-26 ENCOUNTER — Telehealth (HOSPITAL_COMMUNITY): Payer: Self-pay | Admitting: *Deleted

## 2017-09-26 ENCOUNTER — Encounter: Payer: Self-pay | Admitting: *Deleted

## 2017-09-26 VITALS — BP 120/70 | HR 67 | Ht 71.0 in | Wt 174.8 lb

## 2017-09-26 DIAGNOSIS — E7800 Pure hypercholesterolemia, unspecified: Secondary | ICD-10-CM | POA: Diagnosis not present

## 2017-09-26 DIAGNOSIS — I209 Angina pectoris, unspecified: Secondary | ICD-10-CM | POA: Diagnosis not present

## 2017-09-26 DIAGNOSIS — I2510 Atherosclerotic heart disease of native coronary artery without angina pectoris: Secondary | ICD-10-CM | POA: Diagnosis not present

## 2017-09-26 DIAGNOSIS — R079 Chest pain, unspecified: Secondary | ICD-10-CM | POA: Diagnosis not present

## 2017-09-26 MED ORDER — ISOSORBIDE MONONITRATE ER 30 MG PO TB24: 30.0000 mg | ORAL_TABLET | Freq: Every day | ORAL | 6 refills | Status: DC

## 2017-09-26 NOTE — Telephone Encounter (Signed)
Left message on voicemail per DPR in reference to upcoming appointment scheduled on 10/02/17 with detailed instructions given per Myocardial Perfusion Study Information Sheet for the test. LM to arrive 15 minutes early, and that it is imperative to arrive on time for appointment to keep from having the test rescheduled. If you need to cancel or reschedule your appointment, please call the office within 24 hours of your appointment. Failure to do so may result in a cancellation of your appointment, and a $50 no show fee. Phone number given for call back for any questions. Kirstie Peri

## 2017-09-26 NOTE — Patient Instructions (Signed)
Medication Instructions:  Please start Isosorbide 30 mg once a day. Continue all other medications as listed.  Testing/Procedures: Your physician has requested that you have a myoview. For further information please visit [email or web address]. Please follow instruction sheet, as given.  Follow-Up: Follow up in approximately 1 month with Dr Marlou Porch.  If you need a refill on your cardiac medications before your next appointment, please call your pharmacy.  Thank you for choosing [facility]!!

## 2017-09-26 NOTE — Progress Notes (Signed)
Cardiology Office Note    Date:  09/26/2017   ID:  Miguel Wolfe, DOB [birth year], MRN [id number]  PCP:  [name], MD  Cardiologist:   Candee Furbish, MD     History of Present Illness:  Miguel Wolfe is a 72 y.o. male here for follow-up of PCI DES to [facility] on 02/20/16 after abnormal nuclear stress test showed inferolateral MI with peri-infarct ischemia, EF 51% on nuclear, 55% on echocardiogram. This was done secondary to dyspnea on exertion and performing yard work for instance. Was experiencing several months of fatigue, shortness of breath, energy decrease especially with yard work. Legs hurt bilaterally as well, constant, moderate, calf. One hour of yard work challenging. Interestingly however he is able to walk normally throughout the neighborhood without any symptoms. Flushing. He is not taking any niacin supplementation. ED as well.  His father had coronary artery disease and he also carries the diagnosis of hypertension as well as hyperlipidemia. He is not a smoker. Weight has been under control.  He was also complaining of some myalgias and pravastatin was stopped, did not seem to help. Restarted and may have felt worse. Now off. Now back on and feels no different. Excellent bounding pulses distally. CK was checked and normal. He did smoke but quit cigarettes in 1982. His father at age 4 years old had an myocardial infarction and subsequently has had bypass and stenting still alive. Mother valve issue.   He has had elevated glucose in the past and has been treated with Actos formally.  Hemoglobin 16.3 platelets 163, glucose 107, creatinine 0.81, total cholesterol 200, triglycerides 207, LDL 116, HDL 42, TSH 1.5  EKG personally viewed from 12/29/15 shows sinus rhythm heart rate 63 with no other abnormalities.  He used to work in the past for [employer] in [place].  11/20/16-between our visits, he did message me to see if it was okay to take a brief  course of ibuprofen because of back pain.,  He finished cardiac rehab in July 2018.  Remember, had DES to mid RCA on 02/2016 after an abnormal nuclear stress test.  EF is normal.  Previously had angina prior to PCI with yard work.  His father had history of MI. Bigest complaint insomnia. Tried melatonin.   05/28/2017-doing very well, no significant anginal symptoms.  Sometimes he may feel a slight twinge when he is doing yard work.  Continuing to monitor.  Blood pressures excellent cholesterol excellent.  09/26/2017: Chief complaint follow-up of coronary artery disease, chest pain, chest pressure.  Prior stent placement to right coronary artery 02/2016 after abnormal nuclear stress test.  Normal EF.  Insomnia main issue.  He has been feeling chest pain/pressure over the past 3 to 4 weeks left-sided.  Because of this he stopped going to the gym the pressure seems to get worse in the afternoon. Now getting worse. Constant type pain. There all the time. Varies with activiy. No acute sharp pain. Dull, left. Stepped up with altercation.  Otherwise denies any bleeding fevers chills nausea vomiting syncope.  Past Medical History:  Diagnosis Date  . Colon polyps   . Coronary artery disease involving native coronary artery of native heart without angina pectoris 04/23/2016   Myoview 02/08/16: EF 51, inf-lat, apical lateral scar with peri-infarct ischemia; intermediate risk  // LHC 2/18: dLAD 20, oD1 20, pLCx 20, pRCA 80, mRCA 20, dRCA 30, AM 90 >> PCI: 4 x 24 mm Synergy DES to pRCA   . Fatigue   .  GERD (gastroesophageal reflux disease)   . Hemorrhoids   . History of echocardiogram    Echo 02/08/16: Mild LVH, EF 55-60, no RWMA, Gr 1 DD, trivial AI, mild LAE  . Hyperlipidemia   . Hypertension     Past Surgical History:  Procedure Laterality Date  . CARDIAC CATHETERIZATION    . COLONOSCOPY WITH ESOPHAGOGASTRODUODENOSCOPY (EGD)  2008  . CORONARY STENT INTERVENTION N/A 02/20/2016   Procedure: Coronary Stent  Intervention;  Surgeon: Burnell Blanks, MD;  Location: [facility];  Service: Cardiovascular;  Laterality: N/A;  . LEFT HEART CATH AND CORONARY ANGIOGRAPHY N/A 02/20/2016   Procedure: Left Heart Cath and Coronary Angiography;  Surgeon: Burnell Blanks, MD;  Location: [facility];  Service: Cardiovascular;  Laterality: N/A;  . PILONIDAL CYCT    . REFRACTIVE SURGERY      Current Medications: Outpatient Medications Prior to Visit  Medication Sig Dispense Refill  . amLODipine (NORVASC) 10 MG tablet Take 10 mg by mouth daily.    Marland Kitchen aspirin EC 81 MG tablet Take 81 mg by mouth every evening.     . calcium carbonate (TUMS - DOSED IN MG ELEMENTAL CALCIUM) 500 MG chewable tablet Chew 2 tablets by mouth daily as needed for indigestion or heartburn.    Marland Kitchen ibuprofen (ADVIL,MOTRIN) 200 MG tablet Take 200 mg by mouth 2 (two) times daily as needed for headache or moderate pain.    Marland Kitchen losartan (COZAAR) 100 MG tablet TK 1 T PO QD  0  . nitroGLYCERIN (NITROSTAT) 0.4 MG SL tablet Place 1 tablet (0.4 mg total) under the tongue every 5 (five) minutes as needed for chest pain. 25 tablet 12  . omega-3 acid ethyl esters (LOVAZA) 1 g capsule Take 2 g by mouth 2 (two) times daily.    . pantoprazole (PROTONIX) 40 MG tablet TAKE 1 TABLET(40 MG) BY MOUTH DAILY 90 tablet 1  . propranolol (INDERAL) 20 MG tablet Take 20 mg by mouth daily.  2  . rosuvastatin (CRESTOR) 20 MG tablet Take 20 mg by mouth daily.    Marland Kitchen zolpidem (AMBIEN) 10 MG tablet Take 10 mg by mouth at bedtime.  0   No facility-administered medications prior to visit.      Allergies:   Gemfibrozil; Niaspan [niacin er]; and Simvastatin   Social History   Socioeconomic History  . Marital status: Married    Spouse name: Not on file  . Number of children: Not on file  . Years of education: Not on file  . Highest education level: Not on file  Occupational History  . Not on file  Social Needs  . Financial resource strain: Not on  file  . Food insecurity:    Worry: Not on file    Inability: Not on file  . Transportation needs:    Medical: Not on file    Non-medical: Not on file  Tobacco Use  . Smoking status: Former Smoker    Types: Cigarettes    Last attempt to quit: 02/01/1967    Years since quitting: 50.6  . Smokeless tobacco: Never Used  Substance and Sexual Activity  . Alcohol use: Yes    Comment: a drink before dinner a few times a week  . Drug use: No  . Sexual activity: Not on file  Lifestyle  . Physical activity:    Days per week: Not on file    Minutes per session: Not on file  . Stress: Not on file  Relationships  .  Social connections:    Talks on phone: Not on file    Gets together: Not on file    Attends religious service: Not on file    Active member of club or organization: Not on file    Attends meetings of clubs or organizations: Not on file    Relationship status: Not on file  Other Topics Concern  . Not on file  Social History Narrative  . Not on file     Family History:  The patient's family history includes Colon cancer in his paternal uncle; Crohn's disease in his sister; Healthy in his sister; Heart attack (age of onset: 60) in his father.   ROS:   Please see the history of present illness.    Review of Systems  All other systems reviewed and are negative.    PHYSICAL EXAM:   VS:  BP 120/70   Pulse 67   Ht 5\' 11"  (1.803 m)   Wt 174 lb 12.8 oz (79.3 kg)   SpO2 97%   BMI 24.38 kg/m    GEN: Well nourished, well developed, in no acute distress  HEENT: normal  Neck: no JVD, carotid bruits, or masses Cardiac: RRR; soft systolic murmur, no rubs, or gallops,no edema  Respiratory:  clear to auscultation bilaterally, normal work of breathing GI: soft, nontender, nondistended, + BS MS: no deformity or atrophy  Skin: warm and dry, no rash Neuro:  Alert and Oriented x 3, Strength and sensation are intact Psych: euthymic mood, full affect      Wt Readings from Last 3  Encounters:  09/26/17 174 lb 12.8 oz (79.3 kg)  05/28/17 174 lb 9.6 oz (79.2 kg)  11/20/16 173 lb 9.6 oz (78.7 kg)      Studies/Labs Reviewed:   EKG: 09/26/2017-sinus rhythm 63 left axis deviation borderline LVH no other significant ischemic changes.  Personally reviewed and interpreted.  05/28/2017 shows sinus rhythm 62 with no other abnormalities.  Personally reviewed prior EKG from December reviewed as above, normal sinus rhythm with no other abnormalities. EKG ordered today on 02/06/16 shows sinus rhythm 70 with possible left ventricular hypertrophy. No ST segment changes.  Recent Labs: 01/21/2017: ALT 19   Lipid Panel    Component Value Date/Time   CHOL 151 01/21/2017 0840   TRIG 177 (H) 01/21/2017 0840   HDL 48 01/21/2017 0840   CHOLHDL 3.1 01/21/2017 0840   LDLCALC 68 01/21/2017 0840    Additional studies/ records that were reviewed today include:  Former office notes, lab work, EKG reviewed  [place] 02/08/16  The patient walked for 7:00 of a standard Bruce protocol GXT . Peak HR of 153 which is 100% predicted maximal HR .  There were no ST or T wave changes to suggest ischemia. BP response to exercise was normal .  Defect 1: There is a medium defect of moderate severity present in the basal inferolateral, mid inferolateral and apical lateral location. This is consistent with a prior inferolateral MI with peri-infarct ischemia.  The left ventricular ejection fraction is mildly decreased (45-54%). Nuclear stress EF: 51%.  This is an intermediate risk study.   ECHO 02/08/16  - Left ventricle: The cavity size was normal. Wall thickness was   increased in a pattern of mild LVH. Systolic function was normal.   The estimated ejection fraction was in the range of 55% to 60%.   Wall motion was normal; there were no regional wall motion   abnormalities. Doppler parameters are consistent with abnormal  left ventricular relaxation (grade 1 diastolic dysfunction). - Aortic  valve: There was trivial regurgitation. - Left atrium: The atrium was mildly dilated.  Cath 02/20/16:  Acute Mrg lesion, 90 %stenosed.  Mid RCA lesion, 20 %stenosed.  Dist RCA lesion, 30 %stenosed.  Prox Cx to Mid Cx lesion, 20 %stenosed.  1st Diag lesion, 20 %stenosed.  Dist LAD lesion, 20 %stenosed.  A STENT SYNERGY DES 4X24 drug eluting stent was successfully placed.  Prox RCA to Mid RCA lesion, 80 %stenosed.  Post intervention, there is a 0% residual stenosis.   1. Severe single vessel CAD (severe stenosis mid RCA) 2. Successful PTCA/DES x 1 mid RCA  3. Mild non-obstructive disease Circumflex and LAD  Recommendations: Will continue DAPT with ASA and Plavix for one year. If he needs to interrupt anti-platelet therapy for a toe surgery, this could be considered at 3 months since a Synergy DES was placed, although it would be optimal to continue for at least 6 months. Will discharge home today as part of the same day PCI protocol. Will changed Prilosec to Protonix 40 mg daily. He will be started on Plavix 75 mg daily.  ASSESSMENT:    1. Angina pectoris ([place])   2. Chest pain, unspecified type   3. Coronary artery disease involving native coronary artery of native heart without angina pectoris   4. Pure hypercholesterolemia      PLAN:  In order of problems listed above:  CAD/angina  - DES to [facility] on 02/20/16 after abnormal NUC stress showing inferior infarct with peri-infarct ischemia.  -Given his escalation in symptoms, recent chest pressure over the past 3 to 4 weeks left-sided and his worry, stop going to the gym for instance, we will check a stress test for further evaluation.  Somewhat atypical given constant-like nature.  He states that in the evening if he has 2 cocktails it tends to go away but then it comes back.  -I will add isosorbide 30 mg once a day.  Note, he had shingles vaccine as well.  He does not have any evidence of rash.  Continue with Inderal for  beta-blocker.  Continue with Crestor for high intensity statin.  - EF normal  - Had angina prior to PCI with yard work.  Has noted some slight twinges with yard work.   -He has completed cardiac rehab.  Walking the [employer] in [place], staying active.  -Now on aspirin only.  -Discussed other lesions, 20 to 30% and 90% sidebranch small vessel not amenable to stenting.  Family history of CAD/MI  - Note his father has history of MI/CAD.  Essential hypertension  - Medications reviewed. Well-controlled per [name].  No changes made.  Excellent.  Hyperlipidemia  -Doing well with Crestor 20 mg once a day, high intensity statin to help.  LDL now 57 and ALT is normal at 15. No myalgias  Insomnia/decreased libido -Previously states that he is does not have any issues with erection, just decreased desire.  Times this is common following heart issues, continue to exercise, his insomnia may be related as well, could be outward sign of depression.  Did not address today.   1 month follow-up  Medication Adjustments/Labs and Tests Ordered: Current medicines are reviewed at length with the patient today.  Concerns regarding medicines are outlined above.  Medication changes, Labs and Tests ordered today are listed in the Patient Instructions below. Patient Instructions  Medication Instructions:  Please start Isosorbide 30 mg once a day. Continue all other medications  as listed.  Testing/Procedures: Your physician has requested that you have a myoview. For further information please visit [email or web address]. Please follow instruction sheet, as given.  Follow-Up: Follow up in approximately 1 month with Dr Marlou Porch.  If you need a refill on your cardiac medications before your next appointment, please call your pharmacy.  Thank you for choosing [facility]!!        Signed, Candee Furbish, MD  09/26/2017 10:05 AM    [address], [place],  [place]  [id number] Phone: [phone]; Fax: [phone]

## 2017-10-02 ENCOUNTER — Encounter (HOSPITAL_COMMUNITY): Payer: Medicare Other | Attending: Cardiovascular Disease

## 2017-10-02 DIAGNOSIS — R079 Chest pain, unspecified: Secondary | ICD-10-CM | POA: Diagnosis not present

## 2017-10-02 LAB — MYOCARDIAL PERFUSION IMAGING
CHL CUP MPHR: 150 {beats}/min
CHL CUP NUCLEAR SDS: 4
CHL CUP RESTING HR STRESS: 87 {beats}/min
CSEPPHR: 137 {beats}/min
Estimated workload: 8.5 METS
Exercise duration (min): 7 min
Exercise duration (sec): 0 s
LV dias vol: 107 mL (ref 62–150)
LVSYSVOL: 49 mL
NUC STRESS TID: 1.1
Percent HR: 91 %
SRS: 0
SSS: 4

## 2017-10-02 MED ORDER — TECHNETIUM TC 99M TETROFOSMIN IV KIT
32.8000 | PACK | Freq: Once | INTRAVENOUS | Status: AC | PRN
  Administered 2017-10-02: 32.8 via INTRAVENOUS
  Filled 2017-10-02: qty 33

## 2017-10-02 MED ORDER — TECHNETIUM TC 99M TETROFOSMIN IV KIT
9.1000 | PACK | Freq: Once | INTRAVENOUS | Status: AC | PRN
  Administered 2017-10-02: 9.1 via INTRAVENOUS
  Filled 2017-10-02: qty 10

## 2017-10-04 NOTE — Telephone Encounter (Signed)
It is fine for you to take your isosorbide of morning of stress test. Candee Furbish, MD

## 2017-10-10 ENCOUNTER — Telehealth: Payer: Self-pay | Admitting: Cardiology

## 2017-10-10 MED ORDER — ISOSORBIDE MONONITRATE ER 60 MG PO TB24: 60.0000 mg | ORAL_TABLET | Freq: Every day | ORAL | Status: DC

## 2017-10-10 NOTE — Telephone Encounter (Signed)
Pt calling in c/o having chest pressure that started about a week ago.  The pressure is "pretty much constant" in the left side of his chest that he notices more in the afternoons and evenings.  He denies any other s/s - no SOB, pain, radiation, n/v, rapid heart rate etc.  He reports the isosorbide he was started on did help but the pressure started back this week since his mother has been in the hospital.  The pressure generally resolves during the night and when he wakes in the am it is gone.  Advised I will review with Dr Marlou Porch and c/b with any recommendations.  He has an appt 10/7 with Dr Marlou Porch as well.

## 2017-10-10 NOTE — Telephone Encounter (Signed)
New Message:    Pt c/o of Chest Pain: STAT if CP now or developed within 24 hours  1. Are you having CP right now? No   2. Are you experiencing any other symptoms (ex. SOB, nausea, vomiting, sweating)? No   3. How long have you been experiencing CP? 1 Week   4. Is your CP continuous or coming and going? Coming and Going   5. Have you taken Nitroglycerin? No ?

## 2017-10-10 NOTE — Telephone Encounter (Signed)
Increase isosorbide to 60 mg once a day.  Thanks Candee Furbish, MD

## 2017-10-10 NOTE — Telephone Encounter (Signed)
Pt aware to increase Isosorbide to 60 mg daily.  He will double up on the 30 mg tabs he has now and will f/u on Monday as scheduled.

## 2017-10-14 ENCOUNTER — Ambulatory Visit (INDEPENDENT_AMBULATORY_CARE_PROVIDER_SITE_OTHER): Payer: Medicare Other | Admitting: Cardiology

## 2017-10-14 ENCOUNTER — Telehealth: Payer: Self-pay | Admitting: Cardiology

## 2017-10-14 ENCOUNTER — Ambulatory Visit
Admission: RE | Admit: 2017-10-14 | Discharge: 2017-10-14 | Disposition: A | Discharge status: Home / Self Care | Payer: Medicare Other | Source: Ambulatory Visit | Attending: Cardiology | Admitting: Cardiology

## 2017-10-14 ENCOUNTER — Other Ambulatory Visit: Payer: Self-pay | Admitting: *Deleted

## 2017-10-14 ENCOUNTER — Encounter: Payer: Self-pay | Admitting: Cardiology

## 2017-10-14 VITALS — BP 112/64 | HR 68 | Ht 71.0 in | Wt 174.4 lb

## 2017-10-14 DIAGNOSIS — R079 Chest pain, unspecified: Secondary | ICD-10-CM | POA: Diagnosis not present

## 2017-10-14 DIAGNOSIS — I209 Angina pectoris, unspecified: Secondary | ICD-10-CM

## 2017-10-14 DIAGNOSIS — R9389 Abnormal findings on diagnostic imaging of other specified body structures: Secondary | ICD-10-CM

## 2017-10-14 DIAGNOSIS — R911 Solitary pulmonary nodule: Secondary | ICD-10-CM

## 2017-10-14 DIAGNOSIS — I10 Essential (primary) hypertension: Secondary | ICD-10-CM

## 2017-10-14 DIAGNOSIS — J9811 Atelectasis: Secondary | ICD-10-CM | POA: Diagnosis not present

## 2017-10-14 MED ORDER — ISOSORBIDE MONONITRATE ER 60 MG PO TB24: 60.0000 mg | ORAL_TABLET | Freq: Every day | ORAL | 3 refills | Status: DC

## 2017-10-14 NOTE — Telephone Encounter (Signed)
Reviewed by Dr Candee Furbish who gives verbal orders for pt to have CT of chest without contract d/t pulmonary nodule/abnormal CT.

## 2017-10-14 NOTE — Patient Instructions (Signed)
Medication Instructions:  The current medical regimen is effective;  continue present plan and medications.  If you need a refill on your cardiac medications before your next appointment, please call your pharmacy.   Testing/Procedures: A chest x-ray takes a picture of the organs and structures inside the chest, including the heart, lungs, and blood vessels. This test can show several things, including, whether the heart is enlarges; whether fluid is building up in the lungs; and whether pacemaker / defibrillator leads are still in place.  Follow-Up: At [facility], you and your health needs are our priority.  As part of our continuing mission to provide you with exceptional heart care, we have created designated Provider Care Teams.  These Care Teams include your primary Cardiologist (physician) and Advanced Practice Providers (APPs -  Physician Assistants and Nurse Practitioners) who all work together to provide you with the care you need, when you need it. You will need a follow up appointment in 6 months.  Please call our office 2 months in advance to schedule this appointment.  You may see Dr Marlou Porch or one of the following Advanced Practice Providers on your designated Care Team:   Truitt Merle, NP Cecilie Kicks, NP . Kathyrn Drown, NP  Thank you for choosing [facility]!!

## 2017-10-14 NOTE — Progress Notes (Signed)
Cardiology Office Note:    Date:  10/14/2017   ID:  Miguel Wolfe, DOB 08/09/1945, MRN [id number]  PCP:  [name], MD  Cardiologist:  No primary care provider on file.  Electrophysiologist:  None   Referring MD: [name], MD     History of Present Illness:    Miguel Wolfe is a 72 y.o. male with coronary artery disease here for follow-up of angina.  Last telephone note on 10/10/2017 increased isosorbide to 60 mg a day.  He was feeling chest pressure earlier that week, pretty much constant however in the left side more in the afternoons and evenings.  No associated shortness of breath radiation nausea vomiting.  Originally the isosorbide 30 mg helped but then the pressure started back and we decided to increase it as above to 60.  Usually goes away during the night when he wakes up in the morning it is gone. Trace now axillary. Imdur seemed to heped.   He has had drug-eluting stent placed to the mid RCA 02/20/2016 after inferior lateral MI with peri-infarct ischemia on stress test, 55% EF on echo.  This test was done secondary to dyspnea on exertion after performing yard work for instance.  Father also had CAD.  Previously had myalgias with pravastatin off.  Crestor seems to be working well.  Past Medical History:  Diagnosis Date  . Colon polyps   . Coronary artery disease involving native coronary artery of native heart without angina pectoris 04/23/2016   Myoview 02/08/16: EF 51, inf-lat, apical lateral scar with peri-infarct ischemia; intermediate risk  // LHC 2/18: dLAD 20, oD1 20, pLCx 20, pRCA 80, mRCA 20, dRCA 30, AM 90 >> PCI: 4 x 24 mm Synergy DES to pRCA   . Fatigue   . GERD (gastroesophageal reflux disease)   . Hemorrhoids   . History of echocardiogram    Echo 02/08/16: Mild LVH, EF 55-60, no RWMA, Gr 1 DD, trivial AI, mild LAE  . Hyperlipidemia   . Hypertension     Past Surgical History:  Procedure Laterality Date  . CARDIAC CATHETERIZATION    . COLONOSCOPY  WITH ESOPHAGOGASTRODUODENOSCOPY (EGD)  2008  . CORONARY STENT INTERVENTION N/A 02/20/2016   Procedure: Coronary Stent Intervention;  Surgeon: Burnell Blanks, MD;  Location: [facility];  Service: Cardiovascular;  Laterality: N/A;  . LEFT HEART CATH AND CORONARY ANGIOGRAPHY N/A 02/20/2016   Procedure: Left Heart Cath and Coronary Angiography;  Surgeon: Burnell Blanks, MD;  Location: [facility];  Service: Cardiovascular;  Laterality: N/A;  . PILONIDAL CYCT    . REFRACTIVE SURGERY      Current Medications: Current Meds  Medication Sig  . amLODipine (NORVASC) 10 MG tablet Take 10 mg by mouth daily.  Marland Kitchen aspirin EC 81 MG tablet Take 81 mg by mouth every evening.   Marland Kitchen ibuprofen (ADVIL,MOTRIN) 200 MG tablet Take 200 mg by mouth 2 (two) times daily as needed for headache or moderate pain.  . isosorbide mononitrate (IMDUR) 60 MG 24 hr tablet Take 1 tablet (60 mg total) by mouth daily.  Marland Kitchen losartan (COZAAR) 100 MG tablet Take 100 mg by mouth daily.  . nitroGLYCERIN (NITROSTAT) 0.4 MG SL tablet Place 1 tablet (0.4 mg total) under the tongue every 5 (five) minutes as needed for chest pain.  Marland Kitchen omega-3 acid ethyl esters (LOVAZA) 1 g capsule Take 2 g by mouth 2 (two) times daily.  . pantoprazole (PROTONIX) 40 MG tablet TAKE 1 TABLET(40 MG)  BY MOUTH DAILY  . propranolol (INDERAL) 20 MG tablet Take 20 mg by mouth daily.  . rosuvastatin (CRESTOR) 20 MG tablet Take 20 mg by mouth daily.  Marland Kitchen zolpidem (AMBIEN) 10 MG tablet Take 10 mg by mouth at bedtime.  . [DISCONTINUED] isosorbide mononitrate (IMDUR) 60 MG 24 hr tablet Take 1 tablet (60 mg total) by mouth daily.     Allergies:   Gemfibrozil; Niaspan [niacin er]; and Simvastatin   Social History   Socioeconomic History  . Marital status: Married    Spouse name: Not on file  . Number of children: Not on file  . Years of education: Not on file  . Highest education level: Not on file  Occupational History  . Not on file  Social  Needs  . Financial resource strain: Not on file  . Food insecurity:    Worry: Not on file    Inability: Not on file  . Transportation needs:    Medical: Not on file    Non-medical: Not on file  Tobacco Use  . Smoking status: Former Smoker    Types: Cigarettes    Last attempt to quit: 02/01/1967    Years since quitting: 50.7  . Smokeless tobacco: Never Used  Substance and Sexual Activity  . Alcohol use: Yes    Comment: a drink before dinner a few times a week  . Drug use: No  . Sexual activity: Not on file  Lifestyle  . Physical activity:    Days per week: Not on file    Minutes per session: Not on file  . Stress: Not on file  Relationships  . Social connections:    Talks on phone: Not on file    Gets together: Not on file    Attends religious service: Not on file    Active member of club or organization: Not on file    Attends meetings of clubs or organizations: Not on file    Relationship status: Not on file  Other Topics Concern  . Not on file  Social History Narrative  . Not on file     Family History: The patient's family history includes Colon cancer in his paternal uncle; Crohn's disease in his sister; Healthy in his sister; Heart attack (age of onset: 2) in his father.  ROS:   Please see the history of present illness.    Denies any fevers chills nausea vomiting excessive GERD-like symptoms, no melena, no bleeding,  all other systems reviewed and are negative.  EKGs/Labs/Other Studies Reviewed:    The following studies were reviewed today: Prior cardiac catheterization reviewed, 80% RCA lesion treated with stent.  There were 20 to 30% nonobstructive lesions elsewhere in other vessels.  Nuclear stress test 09/2017-low risk no ischemia  EKG:  EKG is not ordered today.   Recent Labs: 01/21/2017: ALT 19  Recent Lipid Panel    Component Value Date/Time   CHOL 151 01/21/2017 0840   TRIG 177 (H) 01/21/2017 0840   HDL 48 01/21/2017 0840   CHOLHDL 3.1  01/21/2017 0840   LDLCALC 68 01/21/2017 0840    Physical Exam:    VS:  BP 112/64   Pulse 68   Ht 5\' 11"  (1.803 m)   Wt 174 lb 6.4 oz (79.1 kg)   SpO2 96%   BMI 24.32 kg/m     Wt Readings from Last 3 Encounters:  10/14/17 174 lb 6.4 oz (79.1 kg)  10/02/17 174 lb (78.9 kg)  09/26/17 174 lb 12.8 oz (  79.3 kg)     GEN:  Well nourished, well developed in no acute distress HEENT: Normal NECK: No JVD; No carotid bruits LYMPHATICS: No lymphadenopathy CARDIAC: RRR, no murmurs, rubs, gallops, may have some tenderness in that left axillary position where chest discomfort is felt RESPIRATORY:  Clear to auscultation without rales, wheezing or rhonchi  ABDOMEN: Soft, non-tender, non-distended MUSCULOSKELETAL:  No edema; No deformity  SKIN: Warm and dry NEUROLOGIC:  Alert and oriented x 3 PSYCHIATRIC:  Normal affect   ASSESSMENT:    1. Chest pain, unspecified type   2. Essential hypertension    PLAN:    In order of problems listed above:  Atypical chest discomfort/angina - Has known coronary artery disease with RCA stent placement in 2018.  Recent nuclear stress test a few weeks ago was reassuring with no evidence of significant ischemia.  We will go ahead and check a chest x-ray PA and lateral to ensure that there are no structural changes with his left lateral rib cage.  He believes that [name] has performed an x-ray on his chest in the past year. - For now, continue with isosorbide 60 mg.  He does feel like this has helped him.  Despite this, this still could be noncardiac/musculoskeletal type chest pain.  I encouraged exercise walking stretching.  He started to notice this more when he was in [place] with his mother in the hospital under increased stress.  Of course, if his symptoms become more worrisome, we can always proceed with cardiac catheterization for definitive diagnosis but he understands that he would like to mitigate the risk of this if not necessary.  Once again  stress test was reassuring.  CAD -RCA stent 2018 -Continue with isosorbide 60, amlodipine 10, Crestor 20.  Lipids are excellent.  [name] has been checking.  Hypertension -Medications reviewed.    Medication Adjustments/Labs and Tests Ordered: Current medicines are reviewed at length with the patient today.  Concerns regarding medicines are outlined above.  Orders Placed This Encounter  Procedures  . DG Chest 2 View   Meds ordered this encounter  Medications  . isosorbide mononitrate (IMDUR) 60 MG 24 hr tablet    Sig: Take 1 tablet (60 mg total) by mouth daily.    Dispense:  90 tablet    Refill:  3    Patient Instructions  Medication Instructions:  The current medical regimen is effective;  continue present plan and medications.  If you need a refill on your cardiac medications before your next appointment, please call your pharmacy.   Testing/Procedures: A chest x-ray takes a picture of the organs and structures inside the chest, including the heart, lungs, and blood vessels. This test can show several things, including, whether the heart is enlarges; whether fluid is building up in the lungs; and whether pacemaker / defibrillator leads are still in place.  Follow-Up: At [facility], you and your health needs are our priority.  As part of our continuing mission to provide you with exceptional heart care, we have created designated Provider Care Teams.  These Care Teams include your primary Cardiologist (physician) and Advanced Practice Providers (APPs -  Physician Assistants and Nurse Practitioners) who all work together to provide you with the care you need, when you need it. You will need a follow up appointment in 6 months.  Please call our office 2 months in advance to schedule this appointment.  You may see Dr Marlou Porch or one of the following Advanced Practice Providers on your  designated Care Team:   Truitt Merle, NP Cecilie Kicks, NP . Kathyrn Drown, NP  Thank you  for choosing [facility]!!         Signed, Candee Furbish, MD  10/14/2017 11:11 AM    [place]

## 2017-10-14 NOTE — Telephone Encounter (Signed)
New message  Per Valarie Merino would like a call back to discuss an xray report.

## 2017-10-14 NOTE — Telephone Encounter (Signed)
Pt aware of result and new order for CT scan of chest.  He will await a c/b to schedule.

## 2017-10-15 ENCOUNTER — Other Ambulatory Visit: Payer: Self-pay | Admitting: *Deleted

## 2017-10-15 MED ORDER — ISOSORBIDE MONONITRATE ER 60 MG PO TB24: 60.0000 mg | ORAL_TABLET | Freq: Every day | ORAL | 3 refills | Status: DC

## 2017-10-15 NOTE — Telephone Encounter (Signed)
Patient scheduled for 10/23/2017 for CT.

## 2017-10-23 ENCOUNTER — Ambulatory Visit: Payer: Medicare Other | Admitting: Cardiology

## 2017-10-23 ENCOUNTER — Ambulatory Visit (INDEPENDENT_AMBULATORY_CARE_PROVIDER_SITE_OTHER)
Admission: RE | Admit: 2017-10-23 | Discharge: 2017-10-23 | Disposition: A | Discharge status: Home / Self Care | Payer: Medicare Other | Source: Ambulatory Visit | Attending: Cardiology | Admitting: Cardiology

## 2017-10-23 DIAGNOSIS — R9389 Abnormal findings on diagnostic imaging of other specified body structures: Secondary | ICD-10-CM

## 2017-10-23 DIAGNOSIS — R079 Chest pain, unspecified: Secondary | ICD-10-CM

## 2017-10-23 DIAGNOSIS — R911 Solitary pulmonary nodule: Secondary | ICD-10-CM

## 2017-10-23 DIAGNOSIS — R918 Other nonspecific abnormal finding of lung field: Secondary | ICD-10-CM | POA: Diagnosis not present

## 2017-10-25 ENCOUNTER — Telehealth: Payer: Self-pay | Admitting: *Deleted

## 2017-10-25 DIAGNOSIS — R9389 Abnormal findings on diagnostic imaging of other specified body structures: Secondary | ICD-10-CM

## 2017-10-25 DIAGNOSIS — R911 Solitary pulmonary nodule: Secondary | ICD-10-CM

## 2017-10-25 NOTE — Telephone Encounter (Signed)
Follow Up:     Wife called back and said pt wonder if Dr Marlou Porch could call him this afternoon for a few minutes to go over  his results with him please.

## 2017-10-25 NOTE — Telephone Encounter (Signed)
From CT scan report: 4. Thyroid nodule measuring 1.7 cm noted. Consider further evaluation with thyroid ultrasound. If patient is clinically hyperthyroid, consider nuclear medicine thyroid uptake and scan. Further follow up of this will be determined after seeing Dr Servando Snare.

## 2017-10-25 NOTE — Telephone Encounter (Signed)
Dr Marlou Porch called and spoke with pt RE: results.  All questions were answered and pt is aware to expect a call to schedule with Dr Servando Snare.  He will c/b with any questions or concerns.

## 2017-10-25 NOTE — Telephone Encounter (Signed)
Spoke with wife this AM.  Pt did receive the message I left for him last night. Aware I am working on getting an appointment with Dr Servando Snare for further evaluation for pulmonary nodules ASAP.    Notes recorded by Shellia Cleverly, RN on 10/24/2017 at 6:11 PM EDT Spoke with wife and reviewed results (on DPR). Pt is out of town and will not be back until Saturday. Call and lm on cell # [phone] of results and recommendations to be evaluated by Dr Servando Snare. Asked pt to send any questions he has tonight through [place] so that they can be addressed 1st thing in the morning-or I will attempt to contact him again in the AM. ------  Notes recorded by Jerline Pain, MD on 10/24/2017 at 5:41 PM EDT Please have him come in to discuss tomorrow morning. We will set up a consultation with Dr. Ceasar Mons to evaluate pulmonary nodules and to help [place] pursue further evaluation. Thank you. Candee Furbish, MD

## 2017-10-28 ENCOUNTER — Encounter: Payer: Self-pay | Admitting: Cardiothoracic Surgery

## 2017-10-30 ENCOUNTER — Encounter: Payer: Self-pay | Admitting: Cardiothoracic Surgery

## 2017-10-31 DIAGNOSIS — Z23 Encounter for immunization: Secondary | ICD-10-CM | POA: Diagnosis not present

## 2017-11-02 ENCOUNTER — Other Ambulatory Visit: Payer: Self-pay | Admitting: Cardiology

## 2017-11-05 ENCOUNTER — Encounter: Payer: Medicare Other | Admitting: Cardiothoracic Surgery

## 2017-11-06 ENCOUNTER — Other Ambulatory Visit: Payer: Self-pay

## 2017-11-06 ENCOUNTER — Institutional Professional Consult (permissible substitution) (INDEPENDENT_AMBULATORY_CARE_PROVIDER_SITE_OTHER): Payer: Medicare Other | Admitting: Cardiothoracic Surgery

## 2017-11-06 ENCOUNTER — Other Ambulatory Visit: Payer: Self-pay | Admitting: *Deleted

## 2017-11-06 ENCOUNTER — Encounter: Payer: Self-pay | Admitting: Cardiothoracic Surgery

## 2017-11-06 VITALS — BP 140/80 | HR 66 | Resp 18 | Ht 71.0 in | Wt 176.2 lb

## 2017-11-06 DIAGNOSIS — R918 Other nonspecific abnormal finding of lung field: Secondary | ICD-10-CM

## 2017-11-06 DIAGNOSIS — R911 Solitary pulmonary nodule: Secondary | ICD-10-CM

## 2017-11-06 DIAGNOSIS — I209 Angina pectoris, unspecified: Secondary | ICD-10-CM | POA: Diagnosis not present

## 2017-11-06 DIAGNOSIS — Z01818 Encounter for other preprocedural examination: Secondary | ICD-10-CM

## 2017-11-06 NOTE — Progress Notes (Addendum)
[address]       [place] [address]             [phone]                    Alen L Stanbery [place] Medical Record #[id number] Date of Birth: 09-23-1945  Referring: Jerline Pain, MD Primary Care: [name], MD Primary Cardiologist: No primary care provider on file.  Chief Complaint:    Chief Complaint  Patient presents with  . Lung Lesion    Surgical eval, pulmonary nodules noted on Chest CT 10/24/2017,     History of Present Illness:    Miguel Wolfe 72 y.o. male is seen in the office  today for evaluation of recent CT scan of the chest demonstrating multiple pulmonary nodules and groundglass opacities.  The patient is followed by Dr. Marlou Porch for known coronary artery disease.  In February 2018 he had coronary stent placed.  He was maintained on Plavix for a year but since has discontinued it.  He was recently seen by Dr. Marlou Porch in the office for vague left chest discomfort.  A stress test was performed Study Highlights    Nuclear stress EF: 55%.  Blood pressure demonstrated a normal response to exercise.  There was no ST segment deviation noted during stress.  Defect 1: There is a small defect of mild severity present in the apical inferior and apical lateral location.  Findings consistent with mild ischemia.  The left ventricular ejection fraction is normal (55-65%).       In addition to the stress test chest x-ray showed a right lung nodule, this led to a CT scan of the chest which shows multiple pulmonary nodules and groundglass opacities.   The patient is a very limited smoker having smoked for less than 2 years between [place]  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  He he denies any environmental exposures, not aware of any asbestos exposure.  He does do some woodworking with wood dust exposure.  Patient denies any shortness of breath these had no hemoptysis no fever chills no cough.  Patient has  had no chest x-rays or CT scans of the chest in the last 10 years that he is aware of.  There are none noted in the EMR  Current Activity/ Functional Status:  Patient is independent with mobility/ambulation, transfers, ADL's, IADL's.   Zubrod Score: At the time of surgery this patient's most appropriate activity status/level should be described as: [x]     0    Normal activity, no symptoms []     1    Restricted in physical strenuous activity but ambulatory, able to do out light work []     2    Ambulatory and capable of self care, unable to do work activities, up and about               >50 % of waking hours                              []     3    Only limited self care, in bed greater than 50% of waking hours []     4    Completely disabled, no self care, confined to bed or chair []     5    Moribund   Past Medical History:  Diagnosis  Date  . Colon polyps   . Coronary artery disease involving native coronary artery of native heart without angina pectoris 04/23/2016   Myoview 02/08/16: EF 51, inf-lat, apical lateral scar with peri-infarct ischemia; intermediate risk  // LHC 2/18: dLAD 20, oD1 20, pLCx 20, pRCA 80, mRCA 20, dRCA 30, AM 90 >> PCI: 4 x 24 mm Synergy DES to pRCA   . Fatigue   . GERD (gastroesophageal reflux disease)   . Hemorrhoids   . History of echocardiogram    Echo 02/08/16: Mild LVH, EF 55-60, no RWMA, Gr 1 DD, trivial AI, mild LAE  . Hyperlipidemia   . Hypertension     Past Surgical History:  Procedure Laterality Date  . CARDIAC CATHETERIZATION    . COLONOSCOPY WITH ESOPHAGOGASTRODUODENOSCOPY (EGD)  2008  . CORONARY STENT INTERVENTION N/A 02/20/2016   Procedure: Coronary Stent Intervention;  Surgeon: Burnell Blanks, MD;  Location: [facility];  Service: Cardiovascular;  Laterality: N/A;  . LEFT HEART CATH AND CORONARY ANGIOGRAPHY N/A 02/20/2016   Procedure: Left Heart Cath and Coronary Angiography;  Surgeon: Burnell Blanks, MD;  Location: [facility];  Service: Cardiovascular;  Laterality: N/A;  . PILONIDAL CYCT    . REFRACTIVE SURGERY      Family History  Problem Relation Age of Onset  . Heart attack Father 69       BYPASS  . Crohn's disease Sister   . Colon cancer Paternal Uncle   . Healthy Sister    Patient's daughter had a history of thyroid cancer status post thyroidectomy  Social History   Tobacco Use  Smoking Status Former Smoker  . Types: Cigarettes  . Last attempt to quit: 02/01/1967  . Years since quitting: 50.7  Smokeless Tobacco Never Used    Social History   Substance and Sexual Activity  Alcohol Use Yes   Comment: a drink before dinner a few times a week     Allergies  Allergen Reactions  . Gemfibrozil Other (See Comments)    Muscle pain  . Niaspan [Niacin Er] Rash    Flushing - aspirin did not mitigate   . Simvastatin Other (See Comments)    Drug-Drug interaction with amlodipine    Current Outpatient Medications  Medication Sig Dispense Refill  . amLODipine (NORVASC) 10 MG tablet Take 10 mg by mouth daily.    Marland Kitchen aspirin EC 81 MG tablet Take 81 mg by mouth every evening.     Marland Kitchen ibuprofen (ADVIL,MOTRIN) 200 MG tablet Take 200 mg by mouth 2 (two) times daily as needed for headache or moderate pain.    . isosorbide mononitrate (IMDUR) 60 MG 24 hr tablet Take 1 tablet (60 mg total) by mouth daily. 90 tablet 3  . losartan (COZAAR) 100 MG tablet Take 100 mg by mouth daily.    . nitroGLYCERIN (NITROSTAT) 0.4 MG SL tablet Place 1 tablet (0.4 mg total) under the tongue every 5 (five) minutes as needed for chest pain. 25 tablet 12  . omega-3 acid ethyl esters (LOVAZA) 1 g capsule Take 2 g by mouth 2 (two) times daily.    . pantoprazole (PROTONIX) 40 MG tablet TAKE 1 TABLET(40 MG) BY MOUTH DAILY 90 tablet 1  . propranolol (INDERAL) 20 MG tablet Take 20 mg by mouth daily.  2  . rosuvastatin (CRESTOR) 20 MG tablet TAKE 1 TABLET(20 MG) BY MOUTH DAILY 90 tablet 3  . zolpidem (AMBIEN) 10 MG tablet  Take 10 mg by mouth at bedtime.  0   No current facility-administered medications for this visit.     Pertinent items are noted in HPI.   Review of Systems:     Cardiac Review of Systems: [Y] = yes  or   [ N ] = no   Chest Pain [   n ]  Resting SOB [ n  ] Exertional SOB  [n  ]  Orthopnea [ n ]   Pedal Edema [n   ]    Palpitations [n  ] Syncope  Florencio.Farrier  ]   Presyncope [ n  ]   General Review of Systems: [Y] = yes [  ]=no Constitional: recent weight change [  ];  Wt loss over the last 3 months [   ] anorexia [  ]; fatigue [  ]; nausea [  ]; night sweats [  ]; fever [  ]; or chills [  ];           Eye : blurred vision [  ]; diplopia [   ]; vision changes [  ];  Amaurosis fugax[  ]; Resp: cough [  ];  wheezing[  ];  hemoptysis[  ]; shortness of breath[  ]; paroxysmal nocturnal dyspnea[  ]; dyspnea on exertion[  ]; or orthopnea[  ];  GI:  gallstones[  ], vomiting[  ];  dysphagia[  ]; melena[  ];  hematochezia [  ]; heartburn[  ];   Hx of  Colonoscopy[  ]; GU: kidney stones [  ]; hematuria[  ];   dysuria [  ];  nocturia[  ];  history of     obstruction [  ]; urinary frequency [  ]             Skin: rash, swelling[  ];, hair loss[  ];  peripheral edema[  ];  or itching[  ]; Musculosketetal: myalgias[  ];  joint swelling[  ];  joint erythema[  ];  joint pain[y  ];  back pain[  ];  Heme/Lymph: bruising[  ];  bleeding[  ];  anemia[  ];  Neuro: TIA[  ];  headaches[  ];  stroke[  ];  vertigo[  ];  seizures[  ];   paresthesias[  ];  difficulty walking[  ];  Psych:depression[  ]; anxiety[  ];  Endocrine: diabetes[  ];  thyroid dysfunction[  ];  Immunizations: Flu up to date [  ]; Pneumococcal up to date [  ];  Other:     PHYSICAL EXAMINATION: BP 140/80 (BP Location: Right Arm, Patient Position: Sitting, Cuff Size: Normal)   Pulse 66   Resp 18   Ht 5\' 11"  (1.803 m)   Wt 176 lb 3.2 oz (79.9 kg)   SpO2 96% Comment: RA  BMI 24.57 kg/m  General appearance: alert and cooperative Head: Normocephalic,  without obvious abnormality, atraumatic Neck: no adenopathy, no carotid bruit, no JVD, supple, symmetrical, trachea midline and thyroid not enlarged, symmetric, no tenderness/mass/nodules Lymph nodes: Cervical, supraclavicular, and axillary nodes normal. Resp: clear to auscultation bilaterally Back: symmetric, no curvature. ROM normal. No CVA tenderness. Cardio: regular rate and rhythm, S1, S2 normal, no murmur, click, rub or gallop GI: soft, non-tender; bowel sounds normal; no masses,  no organomegaly Extremities: extremities normal, atraumatic, no cyanosis or edema and Homans sign is negative, no sign of DVT Neurologic: Grossly normal  Diagnostic Studies & Laboratory data:     Recent Radiology Findings:   Dg Chest 2 View  Result Date: 10/14/2017 CLINICAL DATA:  LEFT axillary  chest pain for 4-6 weeks, no known injury, history hypertension, former smoker, coronary artery disease post stenting EXAM: CHEST - 2 VIEW COMPARISON:  None FINDINGS: Normal heart size, mediastinal contours, and pulmonary vascularity. Coronary stent noted. Atherosclerotic calcification aorta. Minimal subsegmental atelectasis LEFT base. 10 mm nodular density lower lateral RIGHT lung, doubt nipple shadow from inferior location. Remaining lungs clear. No infiltrate, pleural effusion or pneumothorax. No acute osseous findings. IMPRESSION: Minimal LEFT basilar atelectasis with a 10 mm nonspecific nodular density at the lateral RIGHT lung base; pulmonary nodule not excluded and further assessment by CT chest recommended to exclude pulmonary nodule/tumor. These results will be called to the ordering clinician or representative by the Radiologist Assistant, and communication documented in the PACS or zVision Dashboard. Electronically Signed   By: Lavonia Dana M.D.   On: 10/14/2017 14:22   Ct Chest Wo Contrast  Result Date: 10/24/2017 CLINICAL DATA:  Evaluate pulmonary nodule. Abnormal chest radiograph. EXAM: CT CHEST WITHOUT  CONTRAST TECHNIQUE: Multidetector CT imaging of the chest was performed following the standard protocol without IV contrast. COMPARISON:  None FINDINGS: Cardiovascular: The heart size appears mildly enlarged. Aortic atherosclerosis. Calcification in the LAD left circumflex and RCA coronary artery noted. Mediastinum/Nodes: No enlarged mediastinal or axillary lymph nodes. The trachea appears patent and is midline. Normal appearance of the esophagus. Right lobe of thyroid gland nodule measures 1.7 cm, image 18/2. Lungs/Pleura: Bilateral mixed attenuation pulmonary nodules identified. Within the right upper lobe there is a pure ground-glass attenuating nodule which measures 2.7 cm, image 38/3. Solid-appearing nodule within the right lower lobe measures 1 cm, image 110/3. Ground-glass attenuating nodule within the left upper lobe measures 5 mm, image 55/3. Within the left upper lobe there is a mixed ground-glass and solid attenuating nodule. This measures 2 cm with a central solid component of 1.4 cm, image 61/5. Several adjacent areas of ground-glass nodularity are identified including a 1 cm left apical nodule, image 33/3. Upper Abdomen: No acute abnormality. Musculoskeletal: No chest wall mass or suspicious bone lesions identified. IMPRESSION: 1. Bilateral ground-glass, solid, and part solid pulmonary nodules are identified. Within the left upper lobe there is a part solid nodule measuring 2 cm with a 1.4 cm solid component. In the right lower lobe there is a solid subpleural nodule measuring 1 cm. 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 Thoracic surgery consultation is recommended. 2.  Aortic Atherosclerosis (ICD10-I70.0). 3. Multi vessel coronary artery atherosclerotic calcification 4. Thyroid nodule measuring 1.7 cm noted. Consider further evaluation with thyroid ultrasound. If patient is clinically hyperthyroid, consider nuclear medicine thyroid uptake and scan. Electronically Signed   By: Kerby Moors M.D.   On: 10/24/2017 11:31     I have independently reviewed the above radiology studies  and reviewed the findings with the patient.   Recent Lab Findings: Lab Results  Component Value Date   WBC 4.5 04/24/2016   HGB 15.2 04/24/2016   HCT 44.4 04/24/2016   PLT 162 04/24/2016   GLUCOSE 127 (H) 04/24/2016   CHOL 151 01/21/2017   TRIG 177 (H) 01/21/2017   HDL 48 01/21/2017   LDLCALC 68 01/21/2017   ALT 19 01/21/2017   AST 26 04/24/2016   NA 143 04/24/2016   K 4.1 04/24/2016   CL 102 04/24/2016   CREATININE 0.85 04/24/2016   BUN 17 04/24/2016   CO2 24 04/24/2016   INR 1.0 02/15/2016   Cath February 2018, most recent  Acute Mrg lesion, 90 %stenosed.  Mid RCA lesion, 20 %stenosed.  Dist RCA lesion, 30 %stenosed.  Prox Cx to Mid Cx lesion, 20 %stenosed.  1st Diag lesion, 20 %stenosed.  Dist LAD lesion, 20 %stenosed.  A STENT SYNERGY DES [id number] drug eluting stent was successfully placed.  Prox RCA to Mid RCA lesion, 80 %stenosed.  Post intervention, there is a 0% residual stenosis.   1. Severe single vessel CAD (severe stenosis mid RCA) 2. Successful PTCA/DES x 1 mid RCA  3. Mild non-obstructive disease Circumflex and LAD  Recommendations: Will continue DAPT with ASA and Plavix for one year. If he needs to interrupt anti-platelet therapy for a toe surgery, this could be considered at 3 months since a Synergy DES was placed, although it would be optimal to continue for at least 6 months. Will discharge home today as part of the same day PCI protocol. Will changed Prilosec to Protonix 40 mg daily. He will be started on Plavix 75 mg daily. Follow up with Dr. Marlou Porch or office APP in 1 week.     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  I have outlined with the patient that we will proceed with PET scan, pulmonary function studies.  We will then plan to proceed with navigation bronchoscopy and biopsy of bilateral lung lesions.  The right lower lobe lesion may require CT-guided needle biopsy.   I will plan to see the patient back quickly after PET scan and pulmonary function studies are complete.   I  spent 60 minutes with  the patient face to face and greater then 50% of the time was spent in counseling and coordination of care.    Grace Isaac MD      [address] [place] [address] Office [phone]   Beeper [phone]  11/06/2017 4:03 PM

## 2017-11-11 ENCOUNTER — Ambulatory Visit (HOSPITAL_COMMUNITY)
Admission: RE | Admit: 2017-11-11 | Discharge: 2017-11-11 | Disposition: A | Discharge status: Home / Self Care | Payer: Medicare Other | Source: Ambulatory Visit | Attending: Cardiothoracic Surgery | Admitting: Cardiothoracic Surgery

## 2017-11-11 DIAGNOSIS — Z01818 Encounter for other preprocedural examination: Secondary | ICD-10-CM

## 2017-11-11 DIAGNOSIS — R918 Other nonspecific abnormal finding of lung field: Secondary | ICD-10-CM | POA: Diagnosis not present

## 2017-11-11 LAB — PULMONARY FUNCTION TEST
DL/VA % pred: 111 %
DL/VA: 5.22 ml/min/mmHg/L
DLCO unc % pred: 99 %
DLCO unc: 33.61 ml/min/mmHg
FEF 25-75 Post: 2.63 L/sec
FEF 25-75 Pre: 2.34 L/sec
FEF2575-%Change-Post: 12 %
FEF2575-%Pred-Post: 102 %
FEF2575-%Pred-Pre: 91 %
FEV1-%Change-Post: 0 %
FEV1-%Pred-Post: 93 %
FEV1-%Pred-Pre: 93 %
FEV1-Post: 3.18 L
FEV1-Pre: 3.16 L
FEV1FVC-%Change-Post: 2 %
FEV1FVC-%Pred-Pre: 103 %
FEV6-%Change-Post: 0 %
FEV6-%Pred-Post: 91 %
FEV6-%Pred-Pre: 92 %
FEV6-Post: 3.97 L
FEV6-Pre: 4.01 L
FEV6FVC-%Change-Post: 1 %
FEV6FVC-%Pred-Post: 103 %
FEV6FVC-%Pred-Pre: 102 %
FVC-%Change-Post: -2 %
FVC-%Pred-Post: 88 %
FVC-%Pred-Pre: 90 %
FVC-Post: 4.05 L
FVC-Pre: 4.14 L
Post FEV1/FVC ratio: 78 %
Post FEV6/FVC ratio: 98 %
Pre FEV1/FVC ratio: 76 %
Pre FEV6/FVC Ratio: 97 %
RV % pred: 96 %
RV: 2.42 L
TLC % pred: 92 %
TLC: 6.72 L

## 2017-11-11 MED ORDER — ALBUTEROL SULFATE (2.5 MG/3ML) 0.083% IN NEBU
2.5000 mg | INHALATION_SOLUTION | Freq: Once | RESPIRATORY_TRACT | Status: AC
  Administered 2017-11-11: 2.5 mg via RESPIRATORY_TRACT

## 2017-11-14 ENCOUNTER — Ambulatory Visit (HOSPITAL_COMMUNITY)
Admission: RE | Admit: 2017-11-14 | Discharge: 2017-11-14 | Disposition: A | Discharge status: Home / Self Care | Payer: Medicare Other | Source: Ambulatory Visit | Attending: Cardiothoracic Surgery | Admitting: Cardiothoracic Surgery

## 2017-11-14 DIAGNOSIS — Z01818 Encounter for other preprocedural examination: Secondary | ICD-10-CM | POA: Insufficient documentation

## 2017-11-14 DIAGNOSIS — R918 Other nonspecific abnormal finding of lung field: Secondary | ICD-10-CM | POA: Diagnosis not present

## 2017-11-14 DIAGNOSIS — R911 Solitary pulmonary nodule: Secondary | ICD-10-CM | POA: Diagnosis not present

## 2017-11-14 LAB — GLUCOSE, CAPILLARY: Glucose-Capillary: 104 mg/dL — ABNORMAL HIGH (ref 70–99)

## 2017-11-14 MED ORDER — FLUDEOXYGLUCOSE F - 18 (FDG) INJECTION
9.7000 | Freq: Once | INTRAVENOUS | Status: AC | PRN
  Administered 2017-11-14: 9.7 via INTRAVENOUS

## 2017-11-15 ENCOUNTER — Ambulatory Visit: Payer: Medicare Other | Admitting: Cardiothoracic Surgery

## 2017-11-15 ENCOUNTER — Encounter (HOSPITAL_COMMUNITY): Payer: Self-pay | Admitting: *Deleted

## 2017-11-15 ENCOUNTER — Other Ambulatory Visit: Payer: Self-pay

## 2017-11-15 ENCOUNTER — Ambulatory Visit (INDEPENDENT_AMBULATORY_CARE_PROVIDER_SITE_OTHER): Payer: Medicare Other | Admitting: Cardiothoracic Surgery

## 2017-11-15 ENCOUNTER — Other Ambulatory Visit: Payer: Self-pay | Admitting: *Deleted

## 2017-11-15 VITALS — BP 126/64 | HR 80 | Resp 20 | Ht 71.0 in | Wt 176.0 lb

## 2017-11-15 DIAGNOSIS — I209 Angina pectoris, unspecified: Secondary | ICD-10-CM

## 2017-11-15 DIAGNOSIS — R918 Other nonspecific abnormal finding of lung field: Secondary | ICD-10-CM

## 2017-11-15 DIAGNOSIS — E041 Nontoxic single thyroid nodule: Secondary | ICD-10-CM

## 2017-11-15 NOTE — Progress Notes (Signed)
Spoke with pt for pre-op call. Pt has hx of CAD with a stent placed in 02/2016. Pt's cardiologist is Dr. Candee Furbish and is the one that ordered the CXR where the nodule was first found. Pt states he is not diabetic.

## 2017-11-15 NOTE — Progress Notes (Signed)
[address]       [place] [address]             [phone]                    Miguel Wolfe [facility] Medical Record #[id number] Date of Birth: 31-Aug-1945  Referring: Miguel Pain, MD Primary Care: Miguel Fess, MD Primary Cardiologist: No primary care provider on file.  Chief Complaint:    Chief Complaint  Patient presents with  . Lung Lesion    f/u review PET Scan and PFT's    History of Present Illness:    Miguel Wolfe 72 y.o. male is seen in the office  Today after his previous evaluation of recent abnormal CT scan.  He returns today after pulmonary function studies and PET scan were performed yesterday.  His case was also discussed at the multidisciplinary thoracic oncology conference.   CT scan of the chest demonstrating multiple pulmonary nodules and groundglass opacities.  The patient is followed by Dr. Marlou Wolfe for known coronary artery disease.  In February 2018 he had coronary stent placed.  He was maintained on Plavix for a year but since has discontinued it.  He was recently seen by Dr. Marlou Wolfe in the office for vague left chest discomfort.  A stress test was performed Study Highlights    Nuclear stress EF: 55%.  Blood pressure demonstrated a normal response to exercise.  There was no ST segment deviation noted during stress.  Defect 1: There is a small defect of mild severity present in the apical inferior and apical lateral location.  Findings consistent with mild ischemia.  The left ventricular ejection fraction is normal (55-65%).       In addition to the stress test chest x-ray showed a right lung nodule, this led to a CT scan of the chest which shows multiple pulmonary nodules and groundglass opacities.   The patient is a very limited smoker having smoked for less than 2 years between [place]  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  He he denies any environmental exposures, not aware of any  asbestos exposure.  He does do some woodworking with wood dust exposure.  Patient denies any shortness of breath these had no hemoptysis no fever chills no cough.  Patient has had no chest x-rays or CT scans of the chest in the last 10 years that he is aware of.  There are none noted in the EMR  Pet scan done yesterday    Current Activity/ Functional Status:  Patient is independent with mobility/ambulation, transfers, ADL's, IADL's.   Zubrod Score: At the time of surgery this patient's most appropriate activity status/level should be described as: [x]     0    Normal activity, no symptoms []     1    Restricted in physical strenuous activity but ambulatory, able to do out light work []     2    Ambulatory and capable of self care, unable to do work activities, up and about               >50 % of waking hours                              []     3    Only limited self care, in bed greater than 50% of  waking hours []     4    Completely disabled, no self care, confined to bed or chair []     5    Moribund   Past Medical History:  Diagnosis Date  . Colon polyps   . Coronary artery disease involving native coronary artery of native heart without angina pectoris 04/23/2016   Myoview 02/08/16: EF 51, inf-lat, apical lateral scar with peri-infarct ischemia; intermediate risk  // LHC 2/18: dLAD 20, oD1 20, pLCx 20, pRCA 80, mRCA 20, dRCA 30, AM 90 >> PCI: 4 x 24 mm Synergy DES to pRCA   . Fatigue   . GERD (gastroesophageal reflux disease)   . Hemorrhoids   . History of echocardiogram    Echo 02/08/16: Mild LVH, EF 55-60, no RWMA, Gr 1 DD, trivial AI, mild LAE  . Hyperlipidemia   . Hypertension     Past Surgical History:  Procedure Laterality Date  . CARDIAC CATHETERIZATION    . COLONOSCOPY WITH ESOPHAGOGASTRODUODENOSCOPY (EGD)  2008  . CORONARY STENT INTERVENTION N/A 02/20/2016   Procedure: Coronary Stent Intervention;  Surgeon: Miguel Blanks, MD;  Location: [facility];   Service: Cardiovascular;  Laterality: N/A;  . LEFT HEART CATH AND CORONARY ANGIOGRAPHY N/A 02/20/2016   Procedure: Left Heart Cath and Coronary Angiography;  Surgeon: Miguel Blanks, MD;  Location: [facility];  Service: Cardiovascular;  Laterality: N/A;  . PILONIDAL CYCT    . REFRACTIVE SURGERY      Family History  Problem Relation Age of Onset  . Heart attack Father 55       BYPASS  . Crohn's disease Sister   . Colon cancer Paternal Uncle   . Healthy Sister    Patient's daughter had a history of thyroid cancer status post thyroidectomy  Social History   Tobacco Use  Smoking Status Former Smoker  . Types: Cigarettes  . Last attempt to quit: 02/01/1967  . Years since quitting: 50.8  Smokeless Tobacco Never Used    Social History   Substance and Sexual Activity  Alcohol Use Yes   Comment: a drink before dinner a few times a week     Allergies  Allergen Reactions  . Gemfibrozil Other (See Comments)    Muscle Wolfe  . Niaspan [Niacin Er] Rash    Flushing - aspirin did not mitigate   . Simvastatin Other (See Comments)    Drug-Drug interaction with amlodipine    Current Outpatient Medications  Medication Sig Dispense Refill  . amLODipine (NORVASC) 10 MG tablet Take 10 mg by mouth daily.    Marland Kitchen aspirin EC 81 MG tablet Take 81 mg by mouth every evening.     Marland Kitchen ibuprofen (ADVIL,MOTRIN) 200 MG tablet Take 200 mg by mouth 2 (two) times daily as needed for headache or moderate Wolfe.    . isosorbide mononitrate (IMDUR) 60 MG 24 hr tablet Take 1 tablet (60 mg total) by mouth daily. 90 tablet 3  . losartan (COZAAR) 100 MG tablet Take 100 mg by mouth daily.    . nitroGLYCERIN (NITROSTAT) 0.4 MG SL tablet Place 1 tablet (0.4 mg total) under the tongue every 5 (five) minutes as needed for chest Wolfe. 25 tablet 12  . omega-3 acid ethyl esters (LOVAZA) 1 g capsule Take 2 g by mouth 2 (two) times daily.    . pantoprazole (PROTONIX) 40 MG tablet TAKE 1 TABLET(40 MG) BY MOUTH  DAILY 90 tablet 1  . propranolol (INDERAL) 20 MG tablet Take 20 mg  by mouth daily.  2  . rosuvastatin (CRESTOR) 20 MG tablet TAKE 1 TABLET(20 MG) BY MOUTH DAILY 90 tablet 3  . zolpidem (AMBIEN) 10 MG tablet Take 10 mg by mouth at bedtime.  0   No current facility-administered medications for this visit.     Pertinent items are noted in HPI.   Review of Systems:     Cardiac Review of Systems: [Y] = yes  or   [ N ] = no   Chest Wolfe [ N ]  Resting SOB [ N  ] Exertional SOB  Aqua.Slicker  ]  Orthopnea [ N ]   Pedal Edema Aqua.Slicker  ]    Palpitations Aqua.Slicker ] Syncope  Aqua.Slicker ]   Presyncope [ N ]   General Review of Systems: [Y] = yes [  ]=no Constitional: recent weight change [  ];  Wt loss over the last 3 months [   ] anorexia [  ]; fatigue [  ]; nausea [  ]; night sweats [  ]; fever [  ]; or chills [  ];           Eye : blurred vision [  ]; diplopia [   ]; vision changes [  ];  Amaurosis fugax[  ]; Resp: cough [  ];  wheezing[  ];  hemoptysis[  ]; shortness of breath[  ]; paroxysmal nocturnal dyspnea[  ]; dyspnea on exertion[  ]; or orthopnea[  ];  GI:  gallstones[  ], vomiting[  ];  dysphagia[  ]; melena[  ];  hematochezia [  ]; heartburn[  ];   Hx of  Colonoscopy[  ]; GU: kidney stones [  ]; hematuria[  ];   dysuria [  ];  nocturia[  ];  history of     obstruction [  ]; urinary frequency [  ]             Skin: rash, swelling[  ];, hair loss[  ];  peripheral edema[  ];  or itching[  ]; Musculosketetal: myalgias[  ];  joint swelling[  ];  joint erythema[  ];  joint Wolfe[y  ];  back Wolfe[  ];  Heme/Lymph: bruising[  ];  bleeding[  ];  anemia[  ];  Neuro: TIA[  ];  headaches[  ];  stroke[  ];  vertigo[  ];  seizures[  ];   paresthesias[  ];  difficulty walking[  ];  Psych:depression[  ]; anxiety[  ];  Endocrine: diabetes[  ];  thyroid dysfunction[  ];  Immunizations: Flu up to date [  ]; Pneumococcal up to date [  ];  Other:     PHYSICAL EXAMINATION: BP 126/64   Pulse 80   Resp 20   Ht 5\' 11"  (1.803 m)    Wt 176 lb (79.8 kg)   SpO2 99% Comment: RA  BMI 24.55 kg/m  General appearance: alert, cooperative and no distress Head: Normocephalic, without obvious abnormality, atraumatic Neck: no adenopathy, no carotid bruit, no JVD, supple, symmetrical, trachea midline and thyroid not enlarged, symmetric, no tenderness/mass/nodules Lymph nodes: Cervical, supraclavicular, and axillary nodes normal. Resp: clear to auscultation bilaterally Back: symmetric, no curvature. ROM normal. No CVA tenderness. Cardio: regular rate and rhythm, S1, S2 normal, no murmur, click, rub or gallop GI: soft, non-tender; bowel sounds normal; no masses,  no organomegaly Extremities: extremities normal, atraumatic, no cyanosis or edema Neurologic: Grossly normal  Diagnostic Studies & Laboratory data:     Recent Radiology Findings:  No results found.  Dg Chest 2 View  Result Date: 10/14/2017 CLINICAL DATA:  LEFT axillary chest Wolfe for 4-6 weeks, no known injury, history hypertension, former smoker, coronary artery disease post stenting EXAM: CHEST - 2 VIEW COMPARISON:  None FINDINGS: Normal heart size, mediastinal contours, and pulmonary vascularity. Coronary stent noted. Atherosclerotic calcification aorta. Minimal subsegmental atelectasis LEFT base. 10 mm nodular density lower lateral RIGHT lung, doubt nipple shadow from inferior location. Remaining lungs clear. No infiltrate, pleural effusion or pneumothorax. No acute osseous findings. IMPRESSION: Minimal LEFT basilar atelectasis with a 10 mm nonspecific nodular density at the lateral RIGHT lung base; pulmonary nodule not excluded and further assessment by CT chest recommended to exclude pulmonary nodule/tumor. These results will be called to the ordering clinician or representative by the Radiologist Assistant, and communication documented in the PACS or zVision Dashboard. Electronically Signed   By: Lavonia Dana M.D.   On: 10/14/2017 14:22   Ct Chest Wo  Contrast  Result Date: 10/24/2017 CLINICAL DATA:  Evaluate pulmonary nodule. Abnormal chest radiograph. EXAM: CT CHEST WITHOUT CONTRAST TECHNIQUE: Multidetector CT imaging of the chest was performed following the standard protocol without IV contrast. COMPARISON:  None FINDINGS: Cardiovascular: The heart size appears mildly enlarged. Aortic atherosclerosis. Calcification in the LAD left circumflex and RCA coronary artery noted. Mediastinum/Nodes: No enlarged mediastinal or axillary lymph nodes. The trachea appears patent and is midline. Normal appearance of the esophagus. Right lobe of thyroid gland nodule measures 1.7 cm, image 18/2. Lungs/Pleura: Bilateral mixed attenuation pulmonary nodules identified. Within the right upper lobe there is a pure ground-glass attenuating nodule which measures 2.7 cm, image 38/3. Solid-appearing nodule within the right lower lobe measures 1 cm, image 110/3. Ground-glass attenuating nodule within the left upper lobe measures 5 mm, image 55/3. Within the left upper lobe there is a mixed ground-glass and solid attenuating nodule. This measures 2 cm with a central solid component of 1.4 cm, image 61/5. Several adjacent areas of ground-glass nodularity are identified including a 1 cm left apical nodule, image 33/3. Upper Abdomen: No acute abnormality. Musculoskeletal: No chest wall mass or suspicious bone lesions identified. IMPRESSION: 1. Bilateral ground-glass, solid, and part solid pulmonary nodules are identified. Within the left upper lobe there is a part solid nodule measuring 2 cm with a 1.4 cm solid component. In the right lower lobe there is a solid subpleural nodule measuring 1 cm. 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 Thoracic surgery consultation is recommended. 2.  Aortic Atherosclerosis (ICD10-I70.0). 3. Multi vessel coronary artery atherosclerotic calcification 4. Thyroid nodule measuring 1.7 cm noted. Consider further evaluation with thyroid  ultrasound. If patient is clinically hyperthyroid, consider nuclear medicine thyroid uptake and scan. Electronically Signed   By: Kerby Moors M.D.   On: 10/24/2017 11:31     I have independently reviewed the above radiology studies  and reviewed the findings with the patient.   Recent Lab Findings: Lab Results  Component Value Date   WBC 4.5 04/24/2016   HGB 15.2 04/24/2016   HCT 44.4 04/24/2016   PLT 162 04/24/2016   GLUCOSE 127 (H) 04/24/2016   CHOL 151 01/21/2017   TRIG 177 (H) 01/21/2017   HDL 48 01/21/2017   LDLCALC 68 01/21/2017   ALT 19 01/21/2017   AST 26 04/24/2016   NA 143 04/24/2016   K 4.1 04/24/2016   CL 102 04/24/2016   CREATININE 0.85 04/24/2016   BUN 17 04/24/2016   CO2 24 04/24/2016   INR 1.0 02/15/2016  Cath February 2018, most recent  Acute Mrg lesion, 90 %stenosed.  Mid RCA lesion, 20 %stenosed.  Dist RCA lesion, 30 %stenosed.  Prox Cx to Mid Cx lesion, 20 %stenosed.  1st Diag lesion, 20 %stenosed.  Dist LAD lesion, 20 %stenosed.  A STENT SYNERGY DES [id number] drug eluting stent was successfully placed.  Prox RCA to Mid RCA lesion, 80 %stenosed.  Post intervention, there is a 0% residual stenosis.   1. Severe single vessel CAD (severe stenosis mid RCA) 2. Successful PTCA/DES x 1 mid RCA  3. Mild non-obstructive disease Circumflex and LAD  Recommendations: Will continue DAPT with ASA and Plavix for one year. If he needs to interrupt anti-platelet therapy for a toe surgery, this could be considered at 3 months since a Synergy DES was placed, although it would be optimal to continue for at least 6 months. Will discharge home today as part of the same day PCI protocol. Will changed Prilosec to Protonix 40 mg daily. He will be started on Plavix 75 mg daily. Follow up with Dr. Marlou Wolfe or office APP in 1 week.   PFT's 3.16 93% 33.61 99% The FVC, FEV1, FEV1/FVC ratio and FEF25-75% are within normal limits, but there is curvature to the flow volume  loop suggesting minimal small airway disease. The airway resistance is normal. Lung volumes are within normal limits. Following administration of bronchodilators, there is no significant response. The diffusing capacity is normal. However, the diffusing capacity was not corrected for the patient's hemoglobin. Conclusions: Minimal airway obstruction is present suggesting small airway disease. 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  Recent PET scan was reviewed with the patient.    I have outlined with the patient that we will proceed   proceed with navigation bronchoscopy and biopsy of bilateral lung lesions.  The right lower lobe lesion may require CT-guided needle biopsy.  Also obtain ultrasound of the hypermetabolic right thyroid mass 1.7 cm in size.  Risks and options of biopsy with navigation bronchoscopy was discussed with the patient his wife in detail and he is willing to proceed.  Grace Isaac MD      [address] [place] [address] Office [phone]   Beeper [phone]  11/15/2017 11:02 AM

## 2017-11-18 ENCOUNTER — Ambulatory Visit (HOSPITAL_COMMUNITY): Payer: Medicare Other

## 2017-11-18 ENCOUNTER — Other Ambulatory Visit: Payer: Self-pay

## 2017-11-18 ENCOUNTER — Encounter (HOSPITAL_COMMUNITY): Payer: Self-pay

## 2017-11-18 ENCOUNTER — Encounter (HOSPITAL_COMMUNITY)
Admission: RE | Disposition: A | Discharge status: Home / Self Care | Payer: Self-pay | Source: Ambulatory Visit | Attending: Cardiothoracic Surgery

## 2017-11-18 ENCOUNTER — Ambulatory Visit (HOSPITAL_COMMUNITY)
Admission: RE | Admit: 2017-11-18 | Discharge: 2017-11-18 | Disposition: A | Discharge status: Home / Self Care | Payer: Medicare Other | Source: Ambulatory Visit | Attending: Cardiothoracic Surgery | Admitting: Cardiothoracic Surgery

## 2017-11-18 DIAGNOSIS — J984 Other disorders of lung: Secondary | ICD-10-CM | POA: Diagnosis not present

## 2017-11-18 DIAGNOSIS — K219 Gastro-esophageal reflux disease without esophagitis: Secondary | ICD-10-CM | POA: Diagnosis not present

## 2017-11-18 DIAGNOSIS — Z955 Presence of coronary angioplasty implant and graft: Secondary | ICD-10-CM | POA: Insufficient documentation

## 2017-11-18 DIAGNOSIS — I10 Essential (primary) hypertension: Secondary | ICD-10-CM | POA: Diagnosis not present

## 2017-11-18 DIAGNOSIS — E785 Hyperlipidemia, unspecified: Secondary | ICD-10-CM | POA: Diagnosis not present

## 2017-11-18 DIAGNOSIS — Z87891 Personal history of nicotine dependence: Secondary | ICD-10-CM | POA: Diagnosis not present

## 2017-11-18 DIAGNOSIS — R918 Other nonspecific abnormal finding of lung field: Secondary | ICD-10-CM | POA: Insufficient documentation

## 2017-11-18 DIAGNOSIS — R079 Chest pain, unspecified: Secondary | ICD-10-CM | POA: Diagnosis not present

## 2017-11-18 DIAGNOSIS — I2510 Atherosclerotic heart disease of native coronary artery without angina pectoris: Secondary | ICD-10-CM | POA: Diagnosis not present

## 2017-11-18 DIAGNOSIS — M1990 Unspecified osteoarthritis, unspecified site: Secondary | ICD-10-CM | POA: Diagnosis not present

## 2017-11-18 DIAGNOSIS — Z7902 Long term (current) use of antithrombotics/antiplatelets: Secondary | ICD-10-CM | POA: Diagnosis not present

## 2017-11-18 DIAGNOSIS — Z09 Encounter for follow-up examination after completed treatment for conditions other than malignant neoplasm: Secondary | ICD-10-CM

## 2017-11-18 DIAGNOSIS — Z419 Encounter for procedure for purposes other than remedying health state, unspecified: Secondary | ICD-10-CM

## 2017-11-18 DIAGNOSIS — R911 Solitary pulmonary nodule: Secondary | ICD-10-CM

## 2017-11-18 DIAGNOSIS — R848 Other abnormal findings in specimens from respiratory organs and thorax: Secondary | ICD-10-CM | POA: Diagnosis not present

## 2017-11-18 HISTORY — PX: VIDEO BRONCHOSCOPY WITH ENDOBRONCHIAL NAVIGATION: SHX6175

## 2017-11-18 HISTORY — DX: Other nonspecific abnormal finding of lung field: R91.8

## 2017-11-18 HISTORY — DX: Unspecified osteoarthritis, unspecified site: M19.90

## 2017-11-18 HISTORY — DX: Family history of other specified conditions: Z84.89

## 2017-11-18 LAB — COMPREHENSIVE METABOLIC PANEL WITH GFR
ALT: 22 U/L (ref 0–44)
AST: 27 U/L (ref 15–41)
Albumin: 4.6 g/dL (ref 3.5–5.0)
Alkaline Phosphatase: 69 U/L (ref 38–126)
Anion gap: 9 (ref 5–15)
BUN: 12 mg/dL (ref 8–23)
CO2: 25 mmol/L (ref 22–32)
Calcium: 8.9 mg/dL (ref 8.9–10.3)
Chloride: 104 mmol/L (ref 98–111)
Creatinine, Ser: 0.69 mg/dL (ref 0.61–1.24)
GFR calc Af Amer: >60 mL/min
GFR calc non Af Amer: >60 mL/min
Glucose, Bld: 111 mg/dL — ABNORMAL HIGH (ref 70–99)
Potassium: 3.6 mmol/L (ref 3.5–5.1)
Sodium: 138 mmol/L (ref 135–145)
Total Bilirubin: 1 mg/dL (ref 0.3–1.2)
Total Protein: 6.9 g/dL (ref 6.5–8.1)

## 2017-11-18 LAB — CBC
HCT: 47.2 % (ref 39.0–52.0)
Hemoglobin: 15.3 g/dL (ref 13.0–17.0)
MCH: 28.9 pg (ref 26.0–34.0)
MCHC: 32.4 g/dL (ref 30.0–36.0)
MCV: 89.1 fL (ref 80.0–100.0)
Platelets: 220 10*3/uL (ref 150–400)
RBC: 5.3 MIL/uL (ref 4.22–5.81)
RDW: 11.9 % (ref 11.5–15.5)
WBC: 6 10*3/uL (ref 4.0–10.5)
nRBC: 0 % (ref 0.0–0.2)

## 2017-11-18 LAB — PROTIME-INR
INR: 1.08
Prothrombin Time: 13.9 s (ref 11.4–15.2)

## 2017-11-18 LAB — APTT: aPTT: 29 seconds (ref 24–36)

## 2017-11-18 SURGERY — VIDEO BRONCHOSCOPY WITH ENDOBRONCHIAL NAVIGATION
Anesthesia: General | Site: Chest

## 2017-11-18 MED ORDER — SUCCINYLCHOLINE CHLORIDE 200 MG/10ML IV SOSY
PREFILLED_SYRINGE | INTRAVENOUS | Status: AC
  Filled 2017-11-18: qty 10

## 2017-11-18 MED ORDER — FENTANYL CITRATE (PF) 100 MCG/2ML IJ SOLN: 25.0000 ug | INTRAMUSCULAR | Status: DC | PRN

## 2017-11-18 MED ORDER — LIDOCAINE 2% (20 MG/ML) 5 ML SYRINGE
INTRAMUSCULAR | Status: AC
  Filled 2017-11-18: qty 5

## 2017-11-18 MED ORDER — EPHEDRINE 5 MG/ML INJ
INTRAVENOUS | Status: AC
  Filled 2017-11-18: qty 10

## 2017-11-18 MED ORDER — PROPOFOL 10 MG/ML IV BOLUS
INTRAVENOUS | Status: DC | PRN
  Administered 2017-11-18: 150 mg via INTRAVENOUS

## 2017-11-18 MED ORDER — PHENYLEPHRINE 40 MCG/ML (10ML) SYRINGE FOR IV PUSH (FOR BLOOD PRESSURE SUPPORT)
PREFILLED_SYRINGE | INTRAVENOUS | Status: AC
  Filled 2017-11-18: qty 10

## 2017-11-18 MED ORDER — LIDOCAINE 2% (20 MG/ML) 5 ML SYRINGE
INTRAMUSCULAR | Status: DC | PRN
  Administered 2017-11-18: 80 mg via INTRAVENOUS

## 2017-11-18 MED ORDER — FENTANYL CITRATE (PF) 250 MCG/5ML IJ SOLN
INTRAMUSCULAR | Status: AC
  Filled 2017-11-18: qty 5

## 2017-11-18 MED ORDER — SUGAMMADEX SODIUM 200 MG/2ML IV SOLN
INTRAVENOUS | Status: DC | PRN
  Administered 2017-11-18: 200 mg via INTRAVENOUS

## 2017-11-18 MED ORDER — ONDANSETRON HCL 4 MG/2ML IJ SOLN
INTRAMUSCULAR | Status: DC | PRN
  Administered 2017-11-18: 4 mg via INTRAVENOUS

## 2017-11-18 MED ORDER — EPINEPHRINE PF 1 MG/ML IJ SOLN
INTRAMUSCULAR | Status: AC
  Filled 2017-11-18: qty 1

## 2017-11-18 MED ORDER — FENTANYL CITRATE (PF) 100 MCG/2ML IJ SOLN
INTRAMUSCULAR | Status: DC | PRN
  Administered 2017-11-18: 150 ug via INTRAVENOUS
  Administered 2017-11-18: 100 ug via INTRAVENOUS

## 2017-11-18 MED ORDER — 0.9 % SODIUM CHLORIDE (POUR BTL) OPTIME
TOPICAL | Status: DC | PRN
  Administered 2017-11-18: 1000 mL

## 2017-11-18 MED ORDER — PROPOFOL 10 MG/ML IV BOLUS
INTRAVENOUS | Status: AC
  Filled 2017-11-18: qty 20

## 2017-11-18 MED ORDER — ROCURONIUM BROMIDE 50 MG/5ML IV SOSY
PREFILLED_SYRINGE | INTRAVENOUS | Status: AC
  Filled 2017-11-18: qty 5

## 2017-11-18 MED ORDER — EPINEPHRINE PF 1 MG/ML IJ SOLN
INTRAMUSCULAR | Status: DC | PRN
  Administered 2017-11-18: 1 mg via ENDOTRACHEOPULMONARY

## 2017-11-18 MED ORDER — DEXAMETHASONE SODIUM PHOSPHATE 10 MG/ML IJ SOLN
INTRAMUSCULAR | Status: AC
  Filled 2017-11-18: qty 2

## 2017-11-18 MED ORDER — PROMETHAZINE HCL 25 MG/ML IJ SOLN: 6.2500 mg | INTRAMUSCULAR | Status: DC | PRN

## 2017-11-18 MED ORDER — MIDAZOLAM HCL 2 MG/2ML IJ SOLN
INTRAMUSCULAR | Status: AC
  Filled 2017-11-18: qty 2

## 2017-11-18 MED ORDER — LACTATED RINGERS IV SOLN
INTRAVENOUS | Status: DC
  Administered 2017-11-18 (×2): via INTRAVENOUS

## 2017-11-18 MED ORDER — DEXAMETHASONE SODIUM PHOSPHATE 4 MG/ML IJ SOLN
INTRAMUSCULAR | Status: DC | PRN
  Administered 2017-11-18: 10 mg via INTRAVENOUS

## 2017-11-18 MED ORDER — MEPERIDINE HCL 50 MG/ML IJ SOLN: 6.2500 mg | INTRAMUSCULAR | Status: DC | PRN

## 2017-11-18 MED ORDER — ROCURONIUM BROMIDE 100 MG/10ML IV SOLN
INTRAVENOUS | Status: DC | PRN
  Administered 2017-11-18: 20 mg via INTRAVENOUS
  Administered 2017-11-18: 40 mg via INTRAVENOUS
  Administered 2017-11-18: 20 mg via INTRAVENOUS

## 2017-11-18 MED ORDER — ONDANSETRON HCL 4 MG/2ML IJ SOLN
INTRAMUSCULAR | Status: AC
  Filled 2017-11-18: qty 2

## 2017-11-18 SURGICAL SUPPLY — 42 items
ADAPTER BRONCHOSCOPE OLYMPUS (ADAPTER) ×2 IMPLANT
ADAPTER VALVE BIOPSY EBUS (MISCELLANEOUS) IMPLANT
ADPR BSCP OLMPS EDG (ADAPTER) ×1
ADPTR VALVE BIOPSY EBUS (MISCELLANEOUS)
BRUSH BIOPSY BRONCH 10 SDTNB (MISCELLANEOUS) ×2 IMPLANT
BRUSH SUPERTRAX BIOPSY (INSTRUMENTS) ×1 IMPLANT
BRUSH SUPERTRAX NDL-TIP CYTO (INSTRUMENTS) ×2 IMPLANT
CANISTER SUCT 3000ML PPV (MISCELLANEOUS) ×2 IMPLANT
CHANNEL WORK EXTEND EDGE 180 (KITS) ×1 IMPLANT
CHANNEL WORK EXTEND EDGE 90 (KITS) IMPLANT
CONT SPEC 4OZ CLIKSEAL STRL BL (MISCELLANEOUS) ×5 IMPLANT
COVER BACK TABLE 60X90IN (DRAPES) ×2 IMPLANT
COVER WAND RF STERILE (DRAPES) ×2 IMPLANT
FILTER STRAW FLUID ASPIR (MISCELLANEOUS) IMPLANT
FORCEPS BIOP SUPERTRX PREMAR (INSTRUMENTS) ×1 IMPLANT
FORCEPS RADIAL JAW LRG 4 PULM (INSTRUMENTS) IMPLANT
GAUZE SPONGE 4X4 12PLY STRL (GAUZE/BANDAGES/DRESSINGS) ×2 IMPLANT
GLOVE BIO SURGEON STRL SZ 6.5 (GLOVE) ×2 IMPLANT
KIT CLEAN ENDO COMPLIANCE (KITS) ×2 IMPLANT
KIT PROCEDURE EDGE 180 (KITS) IMPLANT
KIT PROCEDURE EDGE 90 (KITS) IMPLANT
KIT TURNOVER KIT B (KITS) ×2 IMPLANT
MARKER SKIN DUAL TIP RULER LAB (MISCELLANEOUS) ×2 IMPLANT
NDL SUPERTRX PREMARK BIOPSY (NEEDLE) IMPLANT
NEEDLE SUPERTRX PREMARK BIOPSY (NEEDLE) ×6 IMPLANT
NS IRRIG 1000ML POUR BTL (IV SOLUTION) ×2 IMPLANT
OIL SILICONE PENTAX (PARTS (SERVICE/REPAIRS)) ×2 IMPLANT
PAD ARMBOARD 7.5X6 YLW CONV (MISCELLANEOUS) ×4 IMPLANT
PATCHES PATIENT (LABEL) ×6 IMPLANT
RADIAL JAW LRG 4 PULMONARY (INSTRUMENTS) ×1
SYR 20CC LL (SYRINGE) ×1 IMPLANT
SYR 20ML ECCENTRIC (SYRINGE) ×2 IMPLANT
SYR 3ML LL SCALE MARK (SYRINGE) ×2 IMPLANT
TOWEL GREEN STERILE (TOWEL DISPOSABLE) ×2 IMPLANT
TOWEL GREEN STERILE FF (TOWEL DISPOSABLE) ×2 IMPLANT
TRAP SPECIMEN MUCOUS 40CC (MISCELLANEOUS) ×2 IMPLANT
TUBE CONNECTING 20X1/4 (TUBING) ×2 IMPLANT
UNDERPAD 30X30 (UNDERPADS AND DIAPERS) ×2 IMPLANT
VALVE BIOPSY  SINGLE USE (MISCELLANEOUS) ×2
VALVE BIOPSY SINGLE USE (MISCELLANEOUS) ×1 IMPLANT
VALVE SUCTION BRONCHIO DISP (MISCELLANEOUS) ×2 IMPLANT
WATER STERILE IRR 1000ML POUR (IV SOLUTION) ×2 IMPLANT

## 2017-11-18 NOTE — H&P (Signed)
[name]       [place] [address]             [phone]                    [name] [place] Medical Record #[id number] Date of Birth: 06/23/1945 Referring: Jerline Pain, MD Primary Care: [name], MD Primary Cardiologist: No primary care provider on file.  Chief Complaint:        Chief Complaint  Patient presents with  . Lung Lesion    f/u review PET Scan and PFT's     History of Present Illness:    Miguel Wolfe 72 y.o. male is seen in the office   after his previous evaluation of recent abnormal CT scan.  He returns today after pulmonary function studies and PET scan were performed yesterday.  His case was also discussed at the multidisciplinary thoracic oncology conference.   CT scan of the chest demonstrating multiple pulmonary nodules and groundglass opacities.  The patient is followed by Dr. Marlou Porch for known coronary artery disease.  In February 2018 he had coronary stent placed.  He was maintained on Plavix for a year but since has discontinued it.  He was recently seen by Dr. Marlou Porch in the office for vague left chest discomfort.  A stress test was performed Study Highlights    Nuclear stress EF: 55%.  Blood pressure demonstrated a normal response to exercise.  There was no ST segment deviation noted during stress.  Defect 1: There is a small defect of mild severity present in the apical inferior and apical lateral location.  Findings consistent with mild ischemia.  The left ventricular ejection fraction is normal (55-65%).       In addition to the stress test chest x-ray showed a right lung nodule, this led to a CT scan of the chest which shows multiple pulmonary nodules and groundglass opacities.   The patient is a very limited smoker having smoked for less than 2 years between [place]  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  He he denies any environmental exposures, not aware of any  asbestos exposure.  He does do some woodworking with wood dust exposure.  Patient denies any shortness of breath these had no hemoptysis no fever chills no cough.  Patient has had no chest x-rays or CT scans of the chest in the last 10 years that he is aware of.  There are none noted in the EMR  Pet scan done yesterday    Current Activity/ Functional Status:  Patient is independent with mobility/ambulation, transfers, ADL's, IADL's.   Zubrod Score: At the time of surgery this patient's most appropriate activity status/level should be described as: [x]     0    Normal activity, no symptoms []     1    Restricted in physical strenuous activity but ambulatory, able to do out light work []     2    Ambulatory and capable of self care, unable to do work activities, up and about               >50 % of waking hours                              []     3    Only limited self care, in bed  greater than 50% of waking hours []     4    Completely disabled, no self care, confined to bed or chair []     5    Moribund   Past Medical History:  Diagnosis Date  . Arthritis   . Colon polyps   . Coronary artery disease involving native coronary artery of native heart without angina pectoris 04/23/2016   Myoview 02/08/16: EF 51, inf-lat, apical lateral scar with peri-infarct ischemia; intermediate risk  // LHC 2/18: dLAD 20, oD1 20, pLCx 20, pRCA 80, mRCA 20, dRCA 30, AM 90 >> PCI: 4 x 24 mm Synergy DES to pRCA   . Family history of adverse reaction to anesthesia    Mom vomits and Dad went "nuts"  . Fatigue   . GERD (gastroesophageal reflux disease)   . Hemorrhoids   . History of echocardiogram    Echo 02/08/16: Mild LVH, EF 55-60, no RWMA, Gr 1 DD, trivial AI, mild LAE  . Hyperlipidemia   . Hypertension   . Pulmonary nodules     Past Surgical History:  Procedure Laterality Date  . CARDIAC CATHETERIZATION    . COLONOSCOPY WITH ESOPHAGOGASTRODUODENOSCOPY (EGD)  2008  . CORONARY STENT INTERVENTION N/A  02/20/2016   Procedure: Coronary Stent Intervention;  Surgeon: Burnell Blanks, MD;  Location: [facility];  Service: Cardiovascular;  Laterality: N/A;  . LEFT HEART CATH AND CORONARY ANGIOGRAPHY N/A 02/20/2016   Procedure: Left Heart Cath and Coronary Angiography;  Surgeon: Burnell Blanks, MD;  Location: [facility];  Service: Cardiovascular;  Laterality: N/A;  . PILONIDAL CYCT    . REFRACTIVE SURGERY      Family History  Problem Relation Age of Onset  . Heart attack Father 29       BYPASS  . Crohn's disease Sister   . Colon cancer Paternal Uncle   . Healthy Sister    Patient's daughter had a history of thyroid cancer status post thyroidectomy  Social History   Tobacco Use  Smoking Status Former Smoker  . Types: Cigarettes  . Last attempt to quit: 02/01/1967  . Years since quitting: 50.8  Smokeless Tobacco Never Used    Social History   Substance and Sexual Activity  Alcohol Use Yes   Comment: a drink before dinner a few times a week     Allergies  Allergen Reactions  . Gemfibrozil Other (See Comments)    Muscle pain  . Niaspan [Niacin Er] Rash    Flushing - aspirin did not mitigate   . Simvastatin Other (See Comments)    Drug-Drug interaction with amlodipine    Current Facility-Administered Medications  Medication Dose Route Frequency Provider Last Rate Last Dose  . lactated ringers infusion   Intravenous Continuous Grace Isaac, MD 10 mL/hr at 11/18/17 1039      Pertinent items are noted in HPI.   Review of Systems:     Cardiac Review of Systems: [Y] = yes  or   [ N ] = no   Chest Pain [ N ]  Resting SOB [ N  ] Exertional SOB  Aqua.Slicker  ]  Orthopnea [ N ]   Pedal Edema Aqua.Slicker  ]    Palpitations Aqua.Slicker ] Syncope  Aqua.Slicker ]   Presyncope [ N ]   General Review of Systems: [Y] = yes [  ]=no Constitional: recent weight change [  ];  Wt loss over the last 3 months [   ] anorexia [  ];  fatigue [  ]; nausea [  ]; night sweats [  ]; fever [  ]; or  chills [  ];           Eye : blurred vision [  ]; diplopia [   ]; vision changes [  ];  Amaurosis fugax[  ]; Resp: cough [  ];  wheezing[  ];  hemoptysis[  ]; shortness of breath[  ]; paroxysmal nocturnal dyspnea[  ]; dyspnea on exertion[  ]; or orthopnea[  ];  GI:  gallstones[  ], vomiting[  ];  dysphagia[  ]; melena[  ];  hematochezia [  ]; heartburn[  ];   Hx of  Colonoscopy[  ]; GU: kidney stones [  ]; hematuria[  ];   dysuria [  ];  nocturia[  ];  history of     obstruction [  ]; urinary frequency [  ]             Skin: rash, swelling[  ];, hair loss[  ];  peripheral edema[  ];  or itching[  ]; Musculosketetal: myalgias[  ];  joint swelling[  ];  joint erythema[  ];  joint pain[y  ];  back pain[  ];  Heme/Lymph: bruising[  ];  bleeding[  ];  anemia[  ];  Neuro: TIA[  ];  headaches[  ];  stroke[  ];  vertigo[  ];  seizures[  ];   paresthesias[  ];  difficulty walking[  ];  Psych:depression[  ]; anxiety[  ];  Endocrine: diabetes[  ];  thyroid dysfunction[  ];  Immunizations: Flu up to date [  ]; Pneumococcal up to date [  ];  Other:     PHYSICAL EXAMINATION: BP 137/78   Pulse 62   Temp 97.9 F (36.6 C) (Oral)   Resp 18   Ht 5\' 11"  (1.803 m)   Wt 79.8 kg   SpO2 98%   BMI 24.55 kg/m  General appearance: alert, cooperative and no distress Head: Normocephalic, without obvious abnormality, atraumatic Neck: no adenopathy, no carotid bruit, no JVD, supple, symmetrical, trachea midline and thyroid not enlarged, symmetric, no tenderness/mass/nodules Lymph nodes: Cervical, supraclavicular, and axillary nodes normal. Resp: clear to auscultation bilaterally Back: symmetric, no curvature. ROM normal. No CVA tenderness. Cardio: regular rate and rhythm, S1, S2 normal, no murmur, click, rub or gallop GI: soft, non-tender; bowel sounds normal; no masses,  no organomegaly Extremities: extremities normal, atraumatic, no cyanosis or edema Neurologic: Grossly normal  Diagnostic Studies &  Laboratory data:     Recent Radiology Findings:  Dg Chest 2 View  Result Date: 11/18/2017 CLINICAL DATA:  Preoperative evaluation for upcoming bronchoscopy, lateral chest pain on the left EXAM: CHEST - 2 VIEW COMPARISON:  11/14/2017 FINDINGS: Cardiac shadow is within normal limits. The lungs are well aerated bilaterally. The known somewhat nodular densities in the lungs bilaterally are less well appreciated on the current exam. No sizable effusion is seen. No acute bony abnormality is noted. IMPRESSION: Known nodular densities are not well appreciated on this exam. Electronically Signed   By: Inez Catalina M.D.   On: 11/18/2017 10:27   Nm Pet Image Initial (pi) Skull Base To Thigh  Result Date: 11/15/2017 CLINICAL DATA:  Initial treatment strategy for lung nodule. EXAM: NUCLEAR MEDICINE PET SKULL BASE TO THIGH TECHNIQUE: 9.7 mCi F-18 FDG was injected intravenously. Full-ring PET imaging was performed from the skull base to thigh after the radiotracer. CT data was obtained and used for attenuation correction and anatomic  localization. Fasting blood glucose: 104 mg/dl COMPARISON:  Chest CT 10/23/2017 FINDINGS: Mediastinal blood pool activity: SUV max 2.1 NECK: No hypermetabolic lymph nodes in the neck. Bilateral thyroid nodules are evident with hypermetabolism ( SUV max = 7.1) of the 16 mm right thyroid nodule. Incidental CT findings: none CHEST: The bilateral mixed attenuation pulmonary nodules are again identified. 1 of the more dominant nodules is in the central left upper lobe. On the prior study, a left upper lobe nodule with 14 mm solid component was identified. This is seen on axial image 15 of series 8 today. Low level FDG uptake is present within this nodule ( SUV max = 1.8. Apical 10 mm ground-glass nodule identified on the prior study is visible on 08/08 today. This also shows low level FDG uptake with SUV max = 1.3. The round peripheral 10 mm right lower lobe nodule identified on the prior study  has decreased to 8 mm in the interval (39/8) with SUV max = 0.9 today. Additional tiny scattered ground-glass nodules in both lung show no discernible FDG uptake on today's PET images. Incidental CT findings: Coronary artery calcification is evident. Atherosclerotic calcification is noted in the wall of the thoracic aorta.There is compressive atelectasis in the lower lungs bilaterally. ABDOMEN/PELVIS: No abnormal hypermetabolic activity within the liver, pancreas, adrenal glands, or spleen. No hypermetabolic lymph nodes in the abdomen or pelvis. Incidental CT findings: Prostate gland is enlarged. There is abdominal aortic atherosclerosis without aneurysm. SKELETON: No focal hypermetabolic activity to suggest skeletal metastasis. Incidental CT findings: none IMPRESSION: 1. Bilateral mixed attenuation pulmonary nodule show no substantial change since 10/23/2017 although the solid 10 mm right lower lobe nodule seen previously is now 8 mm. The dominant lesions show low level FDG uptake as outlined above and may reflect infectious/inflammatory etiology although low-grade or well differentiated neoplasm cannot be excluded. 2. 16 mm hypermetabolic right thyroid nodule. Thyroid ultrasound recommended to further evaluate. 3.  Aortic Atherosclerois (ICD10-170.0) 4. Electronically Signed   By: Misty Stanley M.D.   On: 11/15/2017 15:04    Dg Chest 2 View  Result Date: 10/14/2017 CLINICAL DATA:  LEFT axillary chest pain for 4-6 weeks, no known injury, history hypertension, former smoker, coronary artery disease post stenting EXAM: CHEST - 2 VIEW COMPARISON:  None FINDINGS: Normal heart size, mediastinal contours, and pulmonary vascularity. Coronary stent noted. Atherosclerotic calcification aorta. Minimal subsegmental atelectasis LEFT base. 10 mm nodular density lower lateral RIGHT lung, doubt nipple shadow from inferior location. Remaining lungs clear. No infiltrate, pleural effusion or pneumothorax. No acute osseous  findings. IMPRESSION: Minimal LEFT basilar atelectasis with a 10 mm nonspecific nodular density at the lateral RIGHT lung base; pulmonary nodule not excluded and further assessment by CT chest recommended to exclude pulmonary nodule/tumor. These results will be called to the ordering clinician or representative by the Radiologist Assistant, and communication documented in the PACS or zVision Dashboard. Electronically Signed   By: Lavonia Dana M.D.   On: 10/14/2017 14:22   Ct Chest Wo Contrast  Result Date: 10/24/2017 CLINICAL DATA:  Evaluate pulmonary nodule. Abnormal chest radiograph. EXAM: CT CHEST WITHOUT CONTRAST TECHNIQUE: Multidetector CT imaging of the chest was performed following the standard protocol without IV contrast. COMPARISON:  None FINDINGS: Cardiovascular: The heart size appears mildly enlarged. Aortic atherosclerosis. Calcification in the LAD left circumflex and RCA coronary artery noted. Mediastinum/Nodes: No enlarged mediastinal or axillary lymph nodes. The trachea appears patent and is midline. Normal appearance of the esophagus. Right lobe of thyroid gland nodule  measures 1.7 cm, image 18/2. Lungs/Pleura: Bilateral mixed attenuation pulmonary nodules identified. Within the right upper lobe there is a pure ground-glass attenuating nodule which measures 2.7 cm, image 38/3. Solid-appearing nodule within the right lower lobe measures 1 cm, image 110/3. Ground-glass attenuating nodule within the left upper lobe measures 5 mm, image 55/3. Within the left upper lobe there is a mixed ground-glass and solid attenuating nodule. This measures 2 cm with a central solid component of 1.4 cm, image 61/5. Several adjacent areas of ground-glass nodularity are identified including a 1 cm left apical nodule, image 33/3. Upper Abdomen: No acute abnormality. Musculoskeletal: No chest wall mass or suspicious bone lesions identified. IMPRESSION: 1. Bilateral ground-glass, solid, and part solid pulmonary nodules  are identified. Within the left upper lobe there is a part solid nodule measuring 2 cm with a 1.4 cm solid component. In the right lower lobe there is a solid subpleural nodule measuring 1 cm. 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 Thoracic surgery consultation is recommended. 2.  Aortic Atherosclerosis (ICD10-I70.0). 3. Multi vessel coronary artery atherosclerotic calcification 4. Thyroid nodule measuring 1.7 cm noted. Consider further evaluation with thyroid ultrasound. If patient is clinically hyperthyroid, consider nuclear medicine thyroid uptake and scan. Electronically Signed   By: Kerby Moors M.D.   On: 10/24/2017 11:31     I have independently reviewed the above radiology studies  and reviewed the findings with the patient.   Recent Lab Findings: Lab Results  Component Value Date   WBC 6.0 11/18/2017   HGB 15.3 11/18/2017   HCT 47.2 11/18/2017   PLT 220 11/18/2017   GLUCOSE 111 (H) 11/18/2017   CHOL 151 01/21/2017   TRIG 177 (H) 01/21/2017   HDL 48 01/21/2017   LDLCALC 68 01/21/2017   ALT 22 11/18/2017   AST 27 11/18/2017   NA 138 11/18/2017   K 3.6 11/18/2017   CL 104 11/18/2017   CREATININE 0.69 11/18/2017   BUN 12 11/18/2017   CO2 25 11/18/2017   INR 1.08 11/18/2017   Cath February 2018, most recent  Acute Mrg lesion, 90 %stenosed.  Mid RCA lesion, 20 %stenosed.  Dist RCA lesion, 30 %stenosed.  Prox Cx to Mid Cx lesion, 20 %stenosed.  1st Diag lesion, 20 %stenosed.  Dist LAD lesion, 20 %stenosed.  A STENT SYNERGY DES [id number] drug eluting stent was successfully placed.  Prox RCA to Mid RCA lesion, 80 %stenosed.  Post intervention, there is a 0% residual stenosis.   1. Severe single vessel CAD (severe stenosis mid RCA) 2. Successful PTCA/DES x 1 mid RCA  3. Mild non-obstructive disease Circumflex and LAD  Recommendations: Will continue DAPT with ASA and Plavix for one year. If he needs to interrupt anti-platelet therapy for a toe surgery,  this could be considered at 3 months since a Synergy DES was placed, although it would be optimal to continue for at least 6 months. Will discharge home today as part of the same day PCI protocol. Will changed Prilosec to Protonix 40 mg daily. He will be started on Plavix 75 mg daily. Follow up with Dr. Marlou Porch or office APP in 1 week.   PFT's 3.16 93% 33.61 99% The FVC, FEV1, FEV1/FVC ratio and FEF25-75% are within normal limits, but there is curvature to the flow volume loop suggesting minimal small airway disease. The airway resistance is normal. Lung volumes are within normal limits. Following administration of bronchodilators, there is no significant response. The diffusing capacity is normal. However, the diffusing capacity was  not corrected for the patient's hemoglobin. Conclusions: Minimal airway obstruction is present suggesting small airway disease. 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  Recent PET scan was reviewed with the patient.  I have  recommended to the patient we proceed with navigation bronchoscopy and attempt to bx the left and right upper lobe lung lesions.    I have outlined with the patient proceeding  ultrasound of the hypermetabolic right thyroid mass 1.7 cm in size.  The goals risks and alternatives of the planned surgical procedure Procedure(s): [place] (N/A)  have been discussed with the patient in detail. The risks of the procedure including  death, infection, stroke, pneumothorax,  myocardial infarction, bleeding, blood transfusion have all been discussed specifically.  I have quoted [name] a 1 % of perioperative mortality and a complication rate as high as 30 %. The patient's questions have been answered.CLEOPHAS YOAK is willing  to proceed with the planned procedure.    Grace Isaac MD      [address] ,[place] [address] Office [phone]   Beeper [phone]  11/18/2017 12:46 PM

## 2017-11-18 NOTE — Brief Op Note (Signed)
      [address]       [place] [address]             [id number]      11/18/2017  3:51 PM  PATIENT:  Miguel Wolfe  72 y.o. male  PRE-OPERATIVE DIAGNOSIS:  lung mass bilaterial  POST-OPERATIVE DIAGNOSIS:  lung mass bilaterial  PROCEDURE:  Procedure(s): VIDEO BRONCHOSCOPY WITH ENDOBRONCHIAL NAVIGATION (N/A) and biopsy of left upper and right upper lobe   SURGEON:  Surgeon(s) and Role:    * Grace Isaac, MD - Primary   ANESTHESIA:   general  EBL: none   BLOOD ADMINISTERED:none  DRAINS: none   LOCAL MEDICATIONS USED:  NONE  SPECIMEN:  Source of Specimen:  right upper and left upper lobe   DISPOSITION OF SPECIMEN:  PATHOLOGY  COUNTS:  YES  DICTATION: .Dragon Dictation  PLAN OF CARE: Discharge to home after PACU  PATIENT DISPOSITION:  PACU - hemodynamically stable.   Delay start of Pharmacological VTE agent (>24hrs) due to surgical blood loss or risk of bleeding: yes

## 2017-11-18 NOTE — Transfer of Care (Signed)
Immediate Anesthesia Transfer of Care Note  Patient: Miguel Wolfe  Procedure(s) Performed: VIDEO BRONCHOSCOPY WITH ENDOBRONCHIAL NAVIGATION (N/A Chest)  Patient Location: PACU  Anesthesia Type:General  Level of Consciousness: awake, alert , oriented and patient cooperative  Airway & Oxygen Therapy: Patient Spontanous Breathing  Post-op Assessment: Report given to RN and Post -op Vital signs reviewed and stable  Post vital signs: Reviewed and stable  Last Vitals:  Vitals Value Taken Time  BP    Temp    Pulse 69 11/18/2017  3:45 PM  Resp    SpO2 94 % 11/18/2017  3:45 PM  Vitals shown include unvalidated device data.  Last Pain:  Vitals:   11/18/17 1035  TempSrc:   PainSc: 0-No pain      Patients Stated Pain Goal: 3 (11/57/26 2035)  Complications: No apparent anesthesia complications

## 2017-11-18 NOTE — Anesthesia Procedure Notes (Addendum)
Procedure Name: Intubation Date/Time: 11/18/2017 1:38 PM Performed by: Oletta Lamas, CRNA Pre-anesthesia Checklist: Patient identified, Emergency Drugs available, Suction available and Patient being monitored Patient Re-evaluated:Patient Re-evaluated prior to induction Oxygen Delivery Method: Circle System Utilized Preoxygenation: Pre-oxygenation with 100% oxygen Induction Type: IV induction Ventilation: Mask ventilation without difficulty Laryngoscope Size: Mac and 4 Grade View: Grade III Tube type: Oral Tube size: 8.5 mm Number of attempts: 1 Airway Equipment and Method: Stylet and Oral airway Placement Confirmation: ETT inserted through vocal cords under direct vision,  positive ETCO2 and breath sounds checked- equal and bilateral Secured at: 23 cm Tube secured with: Tape Dental Injury: Teeth and Oropharynx as per pre-operative assessment

## 2017-11-18 NOTE — Anesthesia Preprocedure Evaluation (Addendum)
Anesthesia Evaluation  Patient identified by MRN, date of birth, ID band Patient awake    Reviewed: Allergy & Precautions, NPO status , Patient's Chart, lab work & pertinent test results, reviewed documented beta blocker date and time   History of Anesthesia Complications (+) Family history of anesthesia reaction  Airway Mallampati: II  TM Distance: >3 FB Neck ROM: Full    Dental  (+) Dental Advisory Given   Pulmonary former smoker,    Pulmonary exam normal breath sounds clear to auscultation       Cardiovascular hypertension, Pt. on home beta blockers and Pt. on medications + CAD  Normal cardiovascular exam Rhythm:Regular Rate:Normal  Stress 10/02/2017  Nuclear stress EF: 55%.  Blood pressure demonstrated a normal response to exercise.  There was no ST segment deviation noted during stress.  Defect 1: There is a small defect of mild severity present in the apical inferior and apical lateral location.  Findings consistent with mild ischemia.  The left ventricular ejection fraction is normal (55-65%).   Neuro/Psych negative neurological ROS  negative psych ROS   GI/Hepatic Neg liver ROS, GERD  ,  Endo/Other  negative endocrine ROS  Renal/GU negative Renal ROS     Musculoskeletal  (+) Arthritis ,   Abdominal   Peds  Hematology negative hematology ROS (+)   Anesthesia Other Findings   Reproductive/Obstetrics negative OB ROS                            Anesthesia Physical Anesthesia Plan  ASA: III  Anesthesia Plan: General   Post-op Pain Management:    Induction: Intravenous  PONV Risk Score and Plan: 2 and Ondansetron, Dexamethasone and Treatment may vary due to age or medical condition  Airway Management Planned: Oral ETT  Additional Equipment: None  Intra-op Plan:   Post-operative Plan: Possible Post-op intubation/ventilation  Informed Consent: I have reviewed the  patients History and Physical, chart, labs and discussed the procedure including the risks, benefits and alternatives for the proposed anesthesia with the patient or authorized representative who has indicated his/her understanding and acceptance.   Dental advisory given  Plan Discussed with: CRNA  Anesthesia Plan Comments:        Anesthesia Quick Evaluation

## 2017-11-18 NOTE — Discharge Instructions (Signed)
Flexible Bronchoscopy, Care After °This sheet gives you information about how to care for yourself after your procedure. Your health care provider may also give you more specific instructions. If you have problems or questions, contact your health care provider. °What can I expect after the procedure? °After the procedure, it is common to have the following symptoms for 24-48 hours: °· A cough that is worse than it was before the procedure. °· A low-grade fever. °· A sore throat or hoarse voice. °· Small streaks of blood in the mucus from your lungs (sputum), if tissue samples were removed (biopsy). ° °Follow these instructions at home: °Eating and drinking °· Do not eat or drink anything (including water) for 2 hours after your procedure, or until your numbing medicine (local anesthetic) has worn off. Having a numb throat increases your risk of burning yourself or choking. °· After your numbness is gone and your cough and gag reflexes have returned, you may start eating only soft foods and slowly drinking liquids. °· The day after the procedure, return to your normal diet. °Driving °· Do not drive for 24 hours if you were given a medicine to help you relax (sedative). °· Do not drive or use heavy machinery while taking prescription pain medicine. °General instructions °· Take over-the-counter and prescription medicines only as told by your health care provider. °· Return to your normal activities as told by your health care provider. Ask your health care provider what activities are safe for you. °· Do not use any products that contain nicotine or tobacco, such as cigarettes and e-cigarettes. If you need help quitting, ask your health care provider. °· Keep all follow-up visits as told by your health care provider. This is important, especially if you had a biopsy taken. °Get help right away if: °· You have shortness of breath that gets worse. °· You become light-headed or feel like you might faint. °· You have  chest pain. °· You cough up more than a small amount of blood. °· The amount of blood you cough up increases. °Summary °· Common symptoms in the 24-48 hours following a flexible bronchoscopy include cough, low-grade fever, sore throat or hoarse voice, and blood-streaked mucus from the lungs (if you had a biopsy). °· Do not eat or drink anything (including water) for 2 hours after your procedure, or until your local anesthetic has worn off. You can return to your normal diet the day after the procedure. °· Get help right away if you develop worsening shortness of breath, have chest pain, become light-headed, or cough up more than a small amount of blood. °This information is not intended to replace advice given to you by your health care provider. Make sure you discuss any questions you have with your health care provider. °Document Released: 07/14/2004 Document Revised: 01/13/2016 Document Reviewed: 01/13/2016 °Elsevier Interactive Patient Education © 2017 [employer] ° °

## 2017-11-18 NOTE — Anesthesia Postprocedure Evaluation (Signed)
Anesthesia Post Note  Patient: Miguel Wolfe  Procedure(s) Performed: VIDEO BRONCHOSCOPY WITH ENDOBRONCHIAL NAVIGATION WITH BIOPSIES OF LEFT AND RIGHT UPPER LOBES (N/A Chest)     Patient location during evaluation: PACU Anesthesia Type: General Level of consciousness: sedated and patient cooperative Pain management: pain level controlled Vital Signs Assessment: post-procedure vital signs reviewed and stable Respiratory status: spontaneous breathing Cardiovascular status: stable Anesthetic complications: no    Last Vitals:  Vitals:   11/18/17 1545 11/18/17 1615  BP: 138/74 (!) 137/94  Pulse: 69 71  Resp: 13 18  Temp: (!) 36.3 C   SpO2: 94% 96%    Last Pain:  Vitals:   11/18/17 1615  TempSrc:   PainSc: 0-No pain                 Nolon Nations

## 2017-11-19 ENCOUNTER — Encounter (HOSPITAL_COMMUNITY): Payer: Self-pay | Admitting: Cardiothoracic Surgery

## 2017-11-19 NOTE — Op Note (Signed)
NAME: Miguel Wolfe, Miguel Wolfe MEDICAL RECORD ZG:[id number] ACCOUNT [id number] DATE OF BIRTH:11/03/1945 FACILITY: [facility] LOCATION: [facility] PHYSICIAN:Caliope Ruppert Maryruth Bun, MD  OPERATIVE REPORT  DATE OF PROCEDURE:  11/18/2017  PREOPERATIVE DIAGNOSIS:  Bilateral lung nodules.  POSTOPERATIVE DIAGNOSIS:  Bilateral lung nodules.  Final path pending.  SURGICAL PROCEDURE:  Video bronchoscopy with navigation bronchoscopy and biopsies of left upper and right upper lobe lung nodules.  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  The patient had had no previous CT scans for comparison.  There  was a ground-glass opacity in the right upper posterior lung and a second lesion in the left upper lobe centrally located.  There was also a more solid nodule in the right lower lobe peripherally.  Subsequent PET scan was performed, and the lesion in the  right lower lobe had actually decreased in size slightly.  None of the lesions were intensely hypermetabolic.  To obtain a tissue diagnosis on the 2 lesions in the upper lobe, recommended to the patient that we proceed with navigation bronchoscopy and  biopsy.  The patient agreed and signed informed consent.  DESCRIPTION OF PROCEDURE:  The patient underwent general endotracheal anesthesia without incident.  Appropriate timeout was performed, and then we proceeded with standard video bronchoscopy to the subsegmental level of both the right and left  tracheobronchial tree without endobronchial lesions evident.  Preoperatively, we had created a navigation plan in the Super D system to navigate both to the left upper lobe and right upper lobe lesions.  We then placed the LG probe and proceeded with  appropriate registration of the system and then proceeded with navigation to the left upper lobe first  with a 180-degree working channel.  We were able to fairly easily navigate to within 2 cm of the target lesion.  Local registration with fluoroscopy  was then performed, and we proceeded to navigate to the lesion.  Multiple biopsies with needle brush aspiration, triple brush and biopsy forceps were obtained in this lesion, checking for stable localization frequently with the LG probe.  Initial smears  of these showed benign bronchial cells.  Specimens including biopsies with small biopsy forceps will be submitted to the pathology department.  We then in a similar fashion navigated to the right upper lobe lesion.  This lesion was larger but ground  glass without a solid component high in the right upper lobe posteriorly.  In a similar fashion, we navigated to this and obtained brushings, aspirations and biopsies.  Initial brushings on this lesion also did not show benign bronchial cells.  After sufficient material was collected and localized as much as possible, we then cleared the tracheobronchial tree of all secretions.  The  patient was awakened and extubated in the operating room and transferred to the recovery room for further postoperative observation.  LN/NUANCE  D:11/19/2017 T:11/19/2017 JOB:[id number]

## 2017-11-20 ENCOUNTER — Ambulatory Visit: Payer: Medicare Other | Admitting: Cardiothoracic Surgery

## 2017-11-21 ENCOUNTER — Other Ambulatory Visit: Payer: Self-pay

## 2017-11-21 ENCOUNTER — Ambulatory Visit (INDEPENDENT_AMBULATORY_CARE_PROVIDER_SITE_OTHER): Payer: Medicare Other | Admitting: Cardiothoracic Surgery

## 2017-11-21 ENCOUNTER — Telehealth: Payer: Self-pay | Admitting: Cardiothoracic Surgery

## 2017-11-21 ENCOUNTER — Ambulatory Visit
Admission: RE | Admit: 2017-11-21 | Discharge: 2017-11-21 | Disposition: A | Discharge status: Home / Self Care | Payer: Medicare Other | Source: Ambulatory Visit | Attending: Cardiothoracic Surgery | Admitting: Cardiothoracic Surgery

## 2017-11-21 VITALS — BP 134/64 | HR 68 | Resp 20 | Ht 71.0 in | Wt 174.0 lb

## 2017-11-21 DIAGNOSIS — E042 Nontoxic multinodular goiter: Secondary | ICD-10-CM

## 2017-11-21 DIAGNOSIS — R918 Other nonspecific abnormal finding of lung field: Secondary | ICD-10-CM

## 2017-11-21 DIAGNOSIS — E041 Nontoxic single thyroid nodule: Secondary | ICD-10-CM | POA: Diagnosis not present

## 2017-11-21 DIAGNOSIS — I209 Angina pectoris, unspecified: Secondary | ICD-10-CM | POA: Diagnosis not present

## 2017-11-21 DIAGNOSIS — Z9889 Other specified postprocedural states: Secondary | ICD-10-CM

## 2017-11-21 NOTE — Progress Notes (Signed)
[address]       [place] [address]             [phone]                    Melissa L Barham Goodridge Medical Record #[id number] Date of Birth: 1945/07/26  Referring: Jerline Pain, MD Primary Care: [name], MD Primary Cardiologist: Candee Furbish, MD  Chief Complaint:    Chief Complaint  Patient presents with  . Routine Post Op    f/u after ENB, biopsies of left upper and right upper lobe lung nodules 11/18/17    History of Present Illness:    Miguel Wolfe 72 y.o. male is seen in the office  Today after his previous evaluation of recent abnormal CT scan.  He returns today after navigation bronchoscopy and biopsy of right and left upper lobe lesions.  His case was discussed at the multidisciplinary thoracic oncology conference this morning and pathology results reviewed.     CT scan of the chest demonstrating multiple pulmonary nodules and groundglass opacities.  The patient is followed by Dr. Marlou Porch for known coronary artery disease.  In February 2018 he had coronary stent placed.  He was maintained on Plavix for a year but since has discontinued it.  He was recently seen by Dr. Marlou Porch in the office for vague left chest discomfort.  A stress test was performed  Study Highlights    Nuclear stress EF: 55%.  Blood pressure demonstrated a normal response to exercise.  There was no ST segment deviation noted during stress.  Defect 1: There is a small defect of mild severity present in the apical inferior and apical lateral location.  Findings consistent with mild ischemia.  The left ventricular ejection fraction is normal (55-65%).       In addition to the stress test chest x-ray showed a right lung nodule, this led to a CT scan of the chest which shows multiple pulmonary nodules and groundglass opacities.   The patient is a very limited smoker having smoked for less than 2 years between [place]  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  He he denies any environmental exposures, not aware of any asbestos exposure.  He does do some woodworking with wood dust exposure.  Patient denies any shortness of breath these had no hemoptysis no fever chills no cough.  Patient has had no chest x-rays or CT scans of the chest in the last 10 years that he is aware of.  There are none noted in the EMR  Pet scan done   11/18/2017 PREOPERATIVE DIAGNOSIS:  Bilateral lung nodules. POSTOPERATIVE DIAGNOSIS:  Bilateral lung nodules.  Final path pending. SURGICAL PROCEDURE:  Video bronchoscopy with navigation bronchoscopy and biopsies of left upper and right upper lobe lung nodules.  With the pathology and cytology results were reviewed at the multidisciplinary thoracic oncology conference this morning, and have been reviewed with the patient  Current Activity/ Functional Status:  Patient is independent with mobility/ambulation, transfers, ADL's, IADL's.   Zubrod Score: At the time of surgery this patient's most appropriate activity status/level should be described as: [x]     0    Normal activity, no symptoms []     1    Restricted in physical strenuous activity but ambulatory, able to do out light work []     2    Ambulatory and capable of self  care, unable to do work activities, up and about               >50 % of waking hours                              []     3    Only limited self care, in bed greater than 50% of waking hours []     4    Completely disabled, no self care, confined to bed or chair []     5    Moribund   Past Medical History:  Diagnosis Date  . Arthritis   . Colon polyps   . Coronary artery disease involving native coronary artery of native heart without angina pectoris 04/23/2016   Myoview 02/08/16: EF 51, inf-lat, apical lateral scar with peri-infarct ischemia; intermediate risk  // LHC 2/18: dLAD 20, oD1 20, pLCx 20, pRCA 80, mRCA 20, dRCA 30, AM 90 >> PCI: 4 x 24 mm Synergy DES to pRCA   .  Family history of adverse reaction to anesthesia    Mom vomits and Dad went "nuts"  . Fatigue   . GERD (gastroesophageal reflux disease)   . Hemorrhoids   . History of echocardiogram    Echo 02/08/16: Mild LVH, EF 55-60, no RWMA, Gr 1 DD, trivial AI, mild LAE  . Hyperlipidemia   . Hypertension   . Pulmonary nodules     Past Surgical History:  Procedure Laterality Date  . CARDIAC CATHETERIZATION    . COLONOSCOPY WITH ESOPHAGOGASTRODUODENOSCOPY (EGD)  2008  . CORONARY STENT INTERVENTION N/A 02/20/2016   Procedure: Coronary Stent Intervention;  Surgeon: Burnell Blanks, MD;  Location: [facility];  Service: Cardiovascular;  Laterality: N/A;  . LEFT HEART CATH AND CORONARY ANGIOGRAPHY N/A 02/20/2016   Procedure: Left Heart Cath and Coronary Angiography;  Surgeon: Burnell Blanks, MD;  Location: [facility];  Service: Cardiovascular;  Laterality: N/A;  . PILONIDAL CYCT    . REFRACTIVE SURGERY    . VIDEO BRONCHOSCOPY WITH ENDOBRONCHIAL NAVIGATION N/A 11/18/2017   Procedure: VIDEO BRONCHOSCOPY WITH ENDOBRONCHIAL NAVIGATION WITH BIOPSIES OF LEFT AND RIGHT UPPER LOBES;  Surgeon: Grace Isaac, MD;  Location: [facility];  Service: Thoracic;  Laterality: N/A;    Family History  Problem Relation Age of Onset  . Heart attack Father 62       BYPASS  . Crohn's disease Sister   . Colon cancer Paternal Uncle   . Healthy Sister    Patient's daughter had a history of thyroid cancer status post thyroidectomy  Social History   Tobacco Use  Smoking Status Former Smoker  . Types: Cigarettes  . Last attempt to quit: 02/01/1967  . Years since quitting: 50.8  Smokeless Tobacco Never Used    Social History   Substance and Sexual Activity  Alcohol Use Yes   Comment: a drink before dinner a few times a week     Allergies  Allergen Reactions  . Gemfibrozil Other (See Comments)    Muscle pain  . Niaspan [Niacin Er] Rash    Flushing - aspirin did not mitigate   .  Simvastatin Other (See Comments)    Drug-Drug interaction with amlodipine    Current Outpatient Medications  Medication Sig Dispense Refill  . acetaminophen (TYLENOL) 500 MG tablet Take 250 mg by mouth every 6 (six) hours as needed (FOR PAIN.).    Marland Kitchen amLODipine (NORVASC) 10 MG  tablet Take 10 mg by mouth daily.    Marland Kitchen aspirin EC 81 MG tablet Take 81 mg by mouth every evening.     Marland Kitchen ibuprofen (ADVIL,MOTRIN) 200 MG tablet Take 200 mg by mouth 2 (two) times daily as needed for headache or moderate pain.    . isosorbide mononitrate (IMDUR) 60 MG 24 hr tablet Take 1 tablet (60 mg total) by mouth daily. 90 tablet 3  . losartan (COZAAR) 100 MG tablet Take 100 mg by mouth daily.    . nitroGLYCERIN (NITROSTAT) 0.4 MG SL tablet Place 1 tablet (0.4 mg total) under the tongue every 5 (five) minutes as needed for chest pain. 25 tablet 12  . omega-3 acid ethyl esters (LOVAZA) 1 g capsule Take 2 g by mouth 2 (two) times daily.    . pantoprazole (PROTONIX) 40 MG tablet TAKE 1 TABLET(40 MG) BY MOUTH DAILY (Patient taking differently: Take 40 mg by mouth daily. ) 90 tablet 1  . propranolol (INDERAL) 20 MG tablet Take 20 mg by mouth daily.  2  . rosuvastatin (CRESTOR) 20 MG tablet TAKE 1 TABLET(20 MG) BY MOUTH DAILY (Patient taking differently: Take 20 mg by mouth daily. ) 90 tablet 3  . traZODone (DESYREL) 100 MG tablet Take 100 mg by mouth at bedtime as needed for sleep.    Marland Kitchen zolpidem (AMBIEN) 10 MG tablet Take 10 mg by mouth at bedtime as needed for sleep.   0   No current facility-administered medications for this visit.     Pertinent items are noted in HPI.     PHYSICAL EXAMINATION: BP 134/64   Pulse 68   Resp 20   Ht 5\' 11"  (1.803 m)   Wt 174 lb (78.9 kg)   SpO2 98% Comment: RA  BMI 24.27 kg/m  General appearance: alert and cooperative Head: Normocephalic, without obvious abnormality, atraumatic Neck: no adenopathy, no carotid bruit, no JVD, supple, symmetrical, trachea midline and thyroid not  enlarged, symmetric, no tenderness/mass/nodules Lymph nodes: Cervical, supraclavicular, and axillary nodes normal. Resp: clear to auscultation bilaterally Back: symmetric, no curvature. ROM normal. No CVA tenderness. Cardio: regular rate and rhythm, S1, S2 normal, no murmur, click, rub or gallop GI: soft, non-tender; bowel sounds normal; no masses,  no organomegaly Extremities: extremities normal, atraumatic, no cyanosis or edema and Homans sign is negative, no sign of DVT Neurologic: Grossly normal    Diagnostic Studies & Laboratory data:     Recent Radiology Findings:     Dg Chest 2 View  Result Date: 10/14/2017 CLINICAL DATA:  LEFT axillary chest pain for 4-6 weeks, no known injury, history hypertension, former smoker, coronary artery disease post stenting EXAM: CHEST - 2 VIEW COMPARISON:  None FINDINGS: Normal heart size, mediastinal contours, and pulmonary vascularity. Coronary stent noted. Atherosclerotic calcification aorta. Minimal subsegmental atelectasis LEFT base. 10 mm nodular density lower lateral RIGHT lung, doubt nipple shadow from inferior location. Remaining lungs clear. No infiltrate, pleural effusion or pneumothorax. No acute osseous findings. IMPRESSION: Minimal LEFT basilar atelectasis with a 10 mm nonspecific nodular density at the lateral RIGHT lung base; pulmonary nodule not excluded and further assessment by CT chest recommended to exclude pulmonary nodule/tumor. These results will be called to the ordering clinician or representative by the Radiologist Assistant, and communication documented in the PACS or zVision Dashboard. Electronically Signed   By: Lavonia Dana M.D.   On: 10/14/2017 14:22   Ct Chest Wo Contrast  Result Date: 10/24/2017 CLINICAL DATA:  Evaluate pulmonary nodule. Abnormal chest radiograph.  EXAM: CT CHEST WITHOUT CONTRAST TECHNIQUE: Multidetector CT imaging of the chest was performed following the standard protocol without IV contrast. COMPARISON:   None FINDINGS: Cardiovascular: The heart size appears mildly enlarged. Aortic atherosclerosis. Calcification in the LAD left circumflex and RCA coronary artery noted. Mediastinum/Nodes: No enlarged mediastinal or axillary lymph nodes. The trachea appears patent and is midline. Normal appearance of the esophagus. Right lobe of thyroid gland nodule measures 1.7 cm, image 18/2. Lungs/Pleura: Bilateral mixed attenuation pulmonary nodules identified. Within the right upper lobe there is a pure ground-glass attenuating nodule which measures 2.7 cm, image 38/3. Solid-appearing nodule within the right lower lobe measures 1 cm, image 110/3. Ground-glass attenuating nodule within the left upper lobe measures 5 mm, image 55/3. Within the left upper lobe there is a mixed ground-glass and solid attenuating nodule. This measures 2 cm with a central solid component of 1.4 cm, image 61/5. Several adjacent areas of ground-glass nodularity are identified including a 1 cm left apical nodule, image 33/3. Upper Abdomen: No acute abnormality. Musculoskeletal: No chest wall mass or suspicious bone lesions identified. IMPRESSION: 1. Bilateral ground-glass, solid, and part solid pulmonary nodules are identified. Within the left upper lobe there is a part solid nodule measuring 2 cm with a 1.4 cm solid component. In the right lower lobe there is a solid subpleural nodule measuring 1 cm. 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 Thoracic surgery consultation is recommended. 2.  Aortic Atherosclerosis (ICD10-I70.0). 3. Multi vessel coronary artery atherosclerotic calcification 4. Thyroid nodule measuring 1.7 cm noted. Consider further evaluation with thyroid ultrasound. If patient is clinically hyperthyroid, consider nuclear medicine thyroid uptake and scan. Electronically Signed   By: Kerby Moors M.D.   On: 10/24/2017 11:31     I have independently reviewed the above radiology studies  and reviewed the findings with the  patient.   Recent Lab Findings: Lab Results  Component Value Date   WBC 6.0 11/18/2017   HGB 15.3 11/18/2017   HCT 47.2 11/18/2017   PLT 220 11/18/2017   GLUCOSE 111 (H) 11/18/2017   CHOL 151 01/21/2017   TRIG 177 (H) 01/21/2017   HDL 48 01/21/2017   LDLCALC 68 01/21/2017   ALT 22 11/18/2017   AST 27 11/18/2017   NA 138 11/18/2017   K 3.6 11/18/2017   CL 104 11/18/2017   CREATININE 0.69 11/18/2017   BUN 12 11/18/2017   CO2 25 11/18/2017   INR 1.08 11/18/2017   Cath February 2018, most recent  Acute Mrg lesion, 90 %stenosed.  Mid RCA lesion, 20 %stenosed.  Dist RCA lesion, 30 %stenosed.  Prox Cx to Mid Cx lesion, 20 %stenosed.  1st Diag lesion, 20 %stenosed.  Dist LAD lesion, 20 %stenosed.  A STENT SYNERGY DES [id number] drug eluting stent was successfully placed.  Prox RCA to Mid RCA lesion, 80 %stenosed.  Post intervention, there is a 0% residual stenosis.   1. Severe single vessel CAD (severe stenosis mid RCA) 2. Successful PTCA/DES x 1 mid RCA  3. Mild non-obstructive disease Circumflex and LAD  Recommendations: Will continue DAPT with ASA and Plavix for one year. If he needs to interrupt anti-platelet therapy for a toe surgery, this could be considered at 3 months since a Synergy DES was placed, although it would be optimal to continue for at least 6 months. Will discharge home today as part of the same day PCI protocol. Will changed Prilosec to Protonix 40 mg daily. He will be started on Plavix 75 mg daily. Follow up  with Dr. Marlou Porch or office APP in 1 week.   PFT's 3.16 93% 33.61 99% The FVC, FEV1, FEV1/FVC ratio and FEF25-75% are within normal limits, but there is curvature to the flow volume loop suggesting minimal small airway disease. The airway resistance is normal. Lung volumes are within normal limits. Following administration of bronchodilators, there is no significant response. The diffusing capacity is normal. However, the diffusing capacity was not  corrected for the patient's hemoglobin. Conclusions: Minimal airway obstruction is present suggesting small airway disease. 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  The patient was in favor of this approach.  In addition he is to have an ultrasound of his thyroid done later today, if this is suspicious he will need a needle biopsy of the thyroid.  He does note that his his daughter had papillary cancer of the thyroid resected in the past.  I  spent 15 minutes with  the patient face to face  counseling and coordination of care.    Grace Isaac MD      [address] [place] [address] Office [phone]   Beeper [phone]  11/21/2017 12:10 PM

## 2017-11-21 NOTE — Telephone Encounter (Signed)
Attempted to call the patient concerning his results on thyroid ultrasound, per the recommendation ultrasound will obtain needle biopsy of the right and left lower lobe thyroid.  Continue to try to get in touch with the patient concerning this

## 2017-11-22 ENCOUNTER — Telehealth: Payer: Self-pay

## 2017-11-22 ENCOUNTER — Other Ambulatory Visit: Payer: Self-pay | Admitting: Physician Assistant

## 2017-11-22 NOTE — Telephone Encounter (Signed)
Miguel Wolfe was notified of appointment with [facility] on 11/28/2017 arrive at 1330 for a 1345 needle BX of thyroid.

## 2017-11-28 ENCOUNTER — Other Ambulatory Visit (HOSPITAL_COMMUNITY)
Admission: RE | Admit: 2017-11-28 | Discharge: 2017-11-28 | Disposition: A | Discharge status: Home / Self Care | Payer: Medicare Other | Source: Ambulatory Visit | Attending: Physician Assistant | Admitting: Physician Assistant

## 2017-11-28 ENCOUNTER — Ambulatory Visit
Admission: RE | Admit: 2017-11-28 | Discharge: 2017-11-28 | Disposition: A | Discharge status: Home / Self Care | Payer: Medicare Other | Source: Ambulatory Visit | Attending: Cardiothoracic Surgery | Admitting: Cardiothoracic Surgery

## 2017-11-28 DIAGNOSIS — E042 Nontoxic multinodular goiter: Secondary | ICD-10-CM | POA: Diagnosis not present

## 2017-11-28 DIAGNOSIS — E041 Nontoxic single thyroid nodule: Secondary | ICD-10-CM | POA: Diagnosis not present

## 2017-11-28 DIAGNOSIS — D440 Neoplasm of uncertain behavior of thyroid gland: Secondary | ICD-10-CM | POA: Diagnosis not present

## 2017-11-28 NOTE — Procedures (Signed)
PROCEDURE SUMMARY:  Using direct ultrasound guidance, 5 passes were made using 25 g needles into the nodule within the left lobe of the thyroid.   Using direct ultrasound guidance, 5 passes were made using 25 g needles into the nodule within the right lobe of the thyroid.   Ultrasound was used to confirm needle placements on all occasions.   Specimens were sent to Pathology for analysis.  See procedure note under Imaging tab in Epic for full procedure details.  Itha Kroeker S Odaliz Mcqueary PA-C 11/28/2017 2:27 PM

## 2017-11-29 ENCOUNTER — Telehealth: Payer: Self-pay | Admitting: Cardiothoracic Surgery

## 2017-12-02 ENCOUNTER — Telehealth: Payer: Self-pay | Admitting: Cardiothoracic Surgery

## 2017-12-02 NOTE — Telephone Encounter (Signed)
Patient was called on 1122 to discuss path results with him but was unable to contact him, called back again today discussed his recent pathology from his bilateral needle aspiration of his thyroid.  The needle aspiration of the right thyroid nodule was suspicious for malignancy, the left was benign.  This was explained to the patient I discussed referring him to Dr. Harlow Asa to discuss further diagnostic testing and possible surgery as needed.  Patient has questions answered and was agreeable.

## 2017-12-04 ENCOUNTER — Encounter: Payer: Self-pay | Admitting: Cardiothoracic Surgery

## 2017-12-26 ENCOUNTER — Ambulatory Visit: Payer: Self-pay | Admitting: Surgery

## 2017-12-26 DIAGNOSIS — E042 Nontoxic multinodular goiter: Secondary | ICD-10-CM | POA: Diagnosis not present

## 2017-12-26 DIAGNOSIS — D440 Neoplasm of uncertain behavior of thyroid gland: Secondary | ICD-10-CM | POA: Diagnosis not present

## 2018-01-09 NOTE — Patient Instructions (Addendum)
Miguel Wolfe  01/09/2018   Your procedure is scheduled on: Friday 01/17/2018  Report to [facility]  Entrance              Report to admitting at  0530 AM    Call this number if you have problems the morning of surgery [phone]    Remember: Do not eat food or drink liquids :After Midnight.              BRUSH YOUR TEETH MORNING OF SURGERY AND RINSE YOUR MOUTH OUT, NO CHEWING GUM CANDY OR MINTS.     Take these medicines the morning of surgery with A SIP OF WATER: Amlodipine (Norvasc), Isosorbide mononitrate (Imdur), Pantoprazole (Protonix), Propanolol (Inderal), Rosuvastatin (Crestor)                                 You may not have any metal on your body including hair pins and              piercings  Do not wear jewelry, make-up, lotions, powders or perfumes, deodorant                         Men may shave face and neck.   Do not bring valuables to the hospital. [place].  Contacts, dentures or bridgework may not be worn into surgery.  Leave suitcase in the car. After surgery it may be brought to your room.                  Please read over the following fact sheets you were given: _____________________________________________________________________             [facility] - Preparing for Surgery Before surgery, you can play an important role.  Because skin is not sterile, your skin needs to be as free of germs as possible.  You can reduce the number of germs on your skin by washing with CHG (chlorahexidine gluconate) soap before surgery.  CHG is an antiseptic cleaner which kills germs and bonds with the skin to continue killing germs even after washing. Please DO NOT use if you have an allergy to CHG or antibacterial soaps.  If your skin becomes reddened/irritated stop using the CHG and inform your nurse when you arrive at Short Stay. Do not shave (including legs and underarms) for at least  48 hours prior to the first CHG shower.  You may shave your face/neck. Please follow these instructions carefully:  1.  Shower with CHG Soap the night before surgery and the  morning of Surgery.  2.  If you choose to wash your hair, wash your hair first as usual with your  normal  shampoo.  3.  After you shampoo, rinse your hair and body thoroughly to remove the  shampoo.                           4.  Use CHG as you would any other liquid soap.  You can apply chg directly  to the skin and wash  Gently with a scrungie or clean washcloth.  5.  Apply the CHG Soap to your body ONLY FROM THE NECK DOWN.   Do not use on face/ open                           Wound or open sores. Avoid contact with eyes, ears mouth and genitals (private parts).                       Wash face,  Genitals (private parts) with your normal soap.             6.  Wash thoroughly, paying special attention to the area where your surgery  will be performed.  7.  Thoroughly rinse your body with warm water from the neck down.  8.  DO NOT shower/wash with your normal soap after using and rinsing off  the CHG Soap.                9.  Pat yourself dry with a clean towel.            10.  Wear clean pajamas.            11.  Place clean sheets on your bed the night of your first shower and do not  sleep with pets. Day of Surgery : Do not apply any lotions/deodorants the morning of surgery.  Please wear clean clothes to the hospital/surgery center.  FAILURE TO FOLLOW THESE INSTRUCTIONS MAY RESULT IN THE CANCELLATION OF YOUR SURGERY PATIENT SIGNATURE_________________________________  NURSE SIGNATURE__________________________________  ________________________________________________________________________

## 2018-01-09 NOTE — Progress Notes (Signed)
11/21/2017- noted in [place] note with Dr. Servando Snare  11/18/2017- noted in [place]- CXR and Video Bronch w/biopsies  10/14/2017-noted in [place]- office with Dr. Marlou Porch  10/02/2017- noted in [facility]-Stress test  09/26/2017- noted in [place]  02/20/2016-noted in [place] heart cath and coronary angiography  02/08/2016- noted in [place]

## 2018-01-13 ENCOUNTER — Other Ambulatory Visit: Payer: Self-pay

## 2018-01-13 ENCOUNTER — Ambulatory Visit (HOSPITAL_COMMUNITY)
Admission: RE | Admit: 2018-01-13 | Discharge: 2018-01-13 | Disposition: A | Discharge status: Home / Self Care | Payer: Medicare Other | Source: Ambulatory Visit | Attending: Anesthesiology | Admitting: Anesthesiology

## 2018-01-13 ENCOUNTER — Encounter (HOSPITAL_COMMUNITY): Payer: Self-pay

## 2018-01-13 ENCOUNTER — Encounter (HOSPITAL_COMMUNITY)
Admission: RE | Admit: 2018-01-13 | Discharge: 2018-01-13 | Disposition: A | Discharge status: Home / Self Care | Payer: Medicare Other | Source: Ambulatory Visit | Attending: Surgery | Admitting: Surgery

## 2018-01-13 DIAGNOSIS — Z01818 Encounter for other preprocedural examination: Secondary | ICD-10-CM

## 2018-01-13 DIAGNOSIS — J984 Other disorders of lung: Secondary | ICD-10-CM | POA: Diagnosis not present

## 2018-01-13 HISTORY — DX: Prediabetes: R73.03

## 2018-01-13 LAB — BASIC METABOLIC PANEL
Anion gap: 10 (ref 5–15)
BUN: 18 mg/dL (ref 8–23)
CO2: 27 mmol/L (ref 22–32)
Calcium: 9.1 mg/dL (ref 8.9–10.3)
Chloride: 104 mmol/L (ref 98–111)
Creatinine, Ser: 0.74 mg/dL (ref 0.61–1.24)
GFR calc non Af Amer: >60 mL/min (ref >60)
Glucose, Bld: 110 mg/dL — ABNORMAL HIGH (ref 70–99)
Potassium: 4.4 mmol/L (ref 3.5–5.1)
SODIUM: 141 mmol/L (ref 135–145)

## 2018-01-13 LAB — CBC
HCT: 47.8 % (ref 39.0–52.0)
Hemoglobin: 15.5 g/dL (ref 13.0–17.0)
MCH: 29.4 pg (ref 26.0–34.0)
MCHC: 32.4 g/dL (ref 30.0–36.0)
MCV: 90.7 fL (ref 80.0–100.0)
Platelets: 199 10*3/uL (ref 150–400)
RBC: 5.27 MIL/uL (ref 4.22–5.81)
RDW: 12.7 % (ref 11.5–15.5)
WBC: 5.8 10*3/uL (ref 4.0–10.5)
nRBC: 0 % (ref 0.0–0.2)

## 2018-01-13 LAB — HEMOGLOBIN A1C
Hgb A1c MFr Bld: 5.6 % (ref 4.8–5.6)
Mean Plasma Glucose: 114.02 mg/dL

## 2018-01-13 MED ORDER — CHLORHEXIDINE GLUCONATE CLOTH 2 % EX PADS
6.0000 | MEDICATED_PAD | Freq: Once | CUTANEOUS | Status: DC
  Filled 2018-01-13: qty 6

## 2018-01-15 ENCOUNTER — Encounter (HOSPITAL_COMMUNITY): Payer: Self-pay | Admitting: Surgery

## 2018-01-15 DIAGNOSIS — E042 Nontoxic multinodular goiter: Secondary | ICD-10-CM | POA: Diagnosis present

## 2018-01-15 DIAGNOSIS — D440 Neoplasm of uncertain behavior of thyroid gland: Secondary | ICD-10-CM | POA: Diagnosis present

## 2018-01-15 NOTE — H&P (Signed)
General Surgery [facility]  Miguel Wolfe DOB: January 18, 1945 Married / Language: English / Race: White Male   History of Present Illness  The patient is a 73 year old male who presents with thyroid cancer.  CHIEF COMPLAINT: thyroid nodules, suspicious for malignancy  Patient is referred by Dr. Lanelle Bal for surgical evaluation and management of suspected thyroid carcinoma. Patient had been undergoing evaluation of bilateral pulmonary nodules. Part of his evaluation included a CT scan which identified thyroid nodules. PET/CT scan also showed activity within the thyroid. Patient underwent an ultrasound examination on November 21, 2017. This showed a normal size thyroid gland. In the inferior right lobe was a 2.1 cm nodule which was felt to be moderately suspicious. It did contain calcifications. It was irregular. On the left lobe there was a 1.3 cm nodule which was not suspicious by ultrasound criteria but was hypermetabolic on PET/CT scanning. Fine-needle aspiration biopsy was performed on both lesions. The left-sided lesion was felt to be benign. The right-sided lesion was a category V lesion suspicious for malignancy. TSH level is normal at 1.52. Patient has had no prior history of thyroid disease. He has had no prior head or neck surgery. He has never been on thyroid medication. There is a family history of thyroid disease in the patient's father who took thyroid medication and a history of thyroid cancer and the patient's daughter who underwent thyroidectomy for papillary thyroid carcinoma. Patient presents today for evaluation accompanied by his wife.   Past Surgical History  Anal Fissure Repair  Carotid Artery Surgery  Right. Colon Polyp Removal - Colonoscopy   Diagnostic Studies History Colonoscopy  1-5 years ago  Allergies No Known Drug Allergies [12/26/2017]: Allergies Reconciled   Medication History Rosuvastatin Calcium (20MG   Tablet, Oral) Active. Propranolol HCl (20MG  Tablet, Oral) Active. Pantoprazole Sodium (40MG  Tablet DR, Oral) Active. Omega-3-acid Ethyl Esters (1GM Capsule, Oral) Active. Losartan Potassium (100MG  Tablet, Oral) Active. amLODIPine Besylate (10MG  Tablet, Oral) Active. Medications Reconciled  Social History  Alcohol use  Moderate alcohol use. Caffeine use  Coffee. No drug use  Tobacco use  Former smoker.  Family History Anesthetic complications  Father, Mother. Arthritis  Father. Heart Disease  Father, Mother. Hypertension  Father, Mother. Thyroid problems  Father.  Other Problems  Arthritis  Chest pain  Gastroesophageal Reflux Disease  Hemorrhoids  Hypercholesterolemia  Migraine Headache  Thyroid Disease    Review of Systems General Present- Fatigue. Not Present- Appetite Loss, Chills, Fever, Night Sweats, Weight Gain and Weight Loss. Skin Not Present- Change in Wart/Mole, Dryness, Hives, Jaundice, New Lesions, Non-Healing Wounds, Rash and Ulcer. HEENT Present- Wears glasses/contact lenses. Not Present- Earache, Hearing Loss, Hoarseness, Nose Bleed, Oral Ulcers, Ringing in the Ears, Seasonal Allergies, Sinus Pain, Sore Throat, Visual Disturbances and Yellow Eyes. Respiratory Not Present- Bloody sputum, Chronic Cough, Difficulty Breathing, Snoring and Wheezing. Breast Not Present- Breast Mass, Breast Pain, Nipple Discharge and Skin Changes. Cardiovascular Present- Chest Pain. Not Present- Difficulty Breathing Lying Down, Leg Cramps, Palpitations, Rapid Heart Rate, Shortness of Breath and Swelling of Extremities. Gastrointestinal Present- Hemorrhoids. Not Present- Abdominal Pain, Bloating, Bloody Stool, Change in Bowel Habits, Chronic diarrhea, Constipation, Difficulty Swallowing, Excessive gas, Gets full quickly at meals, Indigestion, Nausea, Rectal Pain and Vomiting. Male Genitourinary Not Present- Blood in Urine, Change in Urinary Stream, Frequency,  Impotence, Nocturia, Painful Urination, Urgency and Urine Leakage. Musculoskeletal Present- Joint Pain. Not Present- Back Pain, Joint Stiffness, Muscle Pain, Muscle Weakness and Swelling of Extremities. Neurological Not Present-  Decreased Memory, Fainting, Headaches, Numbness, Seizures, Tingling, Tremor, Trouble walking and Weakness. Psychiatric Not Present- Anxiety, Bipolar, Change in Sleep Pattern, Depression, Fearful and Frequent crying. Endocrine Not Present- Cold Intolerance, Excessive Hunger, Hair Changes, Heat Intolerance, Hot flashes and New Diabetes. Hematology Present- Blood Thinners. Not Present- Easy Bruising, Excessive bleeding, Gland problems, HIV and Persistent Infections.  Vitals Weight: 177.5 lb Height: 74in Body Surface Area: 2.07 m Body Mass Index: 22.79 kg/m  Temp.: 97.28F  Pulse: 70 (Regular)  P.OX: 97% (Room air) BP: 122/64 (Sitting, Left Arm, Standard)   Physical Exam   See vital signs recorded above  GENERAL APPEARANCE Development: normal Nutritional status: normal Gross deformities: none  SKIN Rash, lesions, ulcers: none Induration, erythema: none Nodules: none palpable  EYES Conjunctiva and lids: normal Pupils: equal and reactive Iris: normal bilaterally  EARS, NOSE, MOUTH, THROAT External ears: no lesion or deformity External nose: no lesion or deformity Hearing: grossly normal Lips: no lesion or deformity Dentition: normal for age Oral mucosa: moist  NECK Symmetric: yes Trachea: midline Thyroid: Palpable 2 cm smooth firm nodule in inferior right thyroid lobe, extends beneath the clavicle, mobile with swallowing, nontender. No palpable abnormality left thyroid lobe. No cervical or supraclavicular lymphadenopathy palpable.  CHEST Respiratory effort: normal Retraction or accessory muscle use: no Breath sounds: normal bilaterally Rales, rhonchi, wheeze: none  CARDIOVASCULAR Auscultation: regular rhythm, normal rate Murmurs:  none Pulses: carotid and radial pulse 2+ palpable Lower extremity edema: none Lower extremity varicosities: none  MUSCULOSKELETAL Station and gait: normal Digits and nails: no clubbing or cyanosis Muscle strength: grossly normal all extremities Range of motion: grossly normal all extremities Deformity: none  LYMPHATIC Cervical: none palpable Supraclavicular: none palpable  PSYCHIATRIC Oriented to person, place, and time: yes Mood and affect: normal for situation Judgment and insight: appropriate for situation    Assessment & Plan  NEOPLASM OF UNCERTAIN BEHAVIOR OF THYROID GLAND (D44.0) MULTIPLE THYROID NODULES (E04.2)  Pt Education - Pamphlet Given - The Thyroid Book: discussed with patient and provided information. Patient presents today accompanied by his wife to discuss newly diagnosed thyroid nodules with suspicion of malignancy. Patient is provided with written literature on thyroid surgery to review at home.  Patient has a 2.1 cm nodule in the inferior right thyroid lobe which is suspicious for malignancy. There is a second nodule in the inferior left thyroid lobe. We reviewed his cytopathology results and his ultrasound findings. I have recommended proceeding with total thyroidectomy for definitive diagnosis and management. We discussed the location of the surgical incision. We discussed the risk and benefits of surgery including the risk of injury to recurrent laryngeal nerve and to parathyroid glands. We discussed the hospital stay to be anticipated. We discussed his postoperative recovery and return to activity. We discussed the need for lifelong thyroid hormone replacement. We discussed the potential need for radioactive iodine treatment. Patient understands all of the above and is asked questions appropriately. He would like to proceed with surgery in the near future.  The risks and benefits of the procedure have been discussed at length with the patient. The  patient understands the proposed procedure, potential alternative treatments, and the course of recovery to be expected. All of the patient's questions have been answered at this time. The patient wishes to proceed with surgery.  Armandina Gemma, [place] [facility] Office: [phone]

## 2018-01-15 NOTE — Anesthesia Preprocedure Evaluation (Addendum)
Anesthesia Evaluation  Patient identified by MRN, date of birth, ID band Patient awake    Reviewed: Allergy & Precautions, NPO status , Patient's Chart, lab work & pertinent test results  Airway Mallampati: II  TM Distance: >3 FB     Dental   Pulmonary former smoker,    breath sounds clear to auscultation       Cardiovascular hypertension, + CAD   Rhythm:Regular Rate:Normal     Neuro/Psych    GI/Hepatic GERD  ,  Endo/Other    Renal/GU      Musculoskeletal  (+) Arthritis ,   Abdominal   Peds  Hematology   Anesthesia Other Findings   Reproductive/Obstetrics                           Anesthesia Physical Anesthesia Plan  ASA: III  Anesthesia Plan: General   Post-op Pain Management:    Induction: Intravenous  PONV Risk Score and Plan: Ondansetron, Dexamethasone and Midazolam  Airway Management Planned: Oral ETT  Additional Equipment:   Intra-op Plan:   Post-operative Plan: Possible Post-op intubation/ventilation  Informed Consent:   Dental advisory given  Plan Discussed with: Anesthesiologist  Anesthesia Plan Comments: (See PST note 01/13/2018, Konrad Felix, PA-C)      Anesthesia Quick Evaluation

## 2018-01-15 NOTE — Progress Notes (Signed)
Anesthesia Chart Review   Case:  [id number] Date/Time:  01/17/18 0715   Procedure:  TOTAL THYROIDECTOMY (N/A )   Anesthesia type:  General   Pre-op diagnosis:  Thyroid neoplasm of uncertain behavoir, multiple thyroid nodules   Location:  [facility] ROOM 04 / [facility]   Surgeon:  Armandina Gemma, MD      DISCUSSION: 73 yo former smoker (quit 02/01/67) with h/o HTN, CAD, GERD (controlled with medication), HLD, pre-diabetic (glucose 110, A1C 5.6 01/13/2018) scheduled for above surgery on 01/17/2018 with Dr. Armandina Gemma.    Pt had DES placed mid RCA 02/2016, maintained on Plavix for 1 year after.  Followed by Dr. Marlou Porch.  He was seen several months ago for vague chest discomfort, stress test performed 10/02/17 which was a low risk test with minimal defect.  He was advised to continue with medical management.  Last seen by Dr. Marlou Porch 10/14/17.  Chest xray ordered at this visit, pulmonary nodules noted and a CT chest was ordered which showed bilateral groundglass opacities both lungs with solid component, 1.7 cm right thyroid nodule.    He was referred to Dr. Servando Snare with cardiothoracic surgery.  Video bronchoscopy done 11/18/17.  Needle biopsy of thyroid performed 11/28/17 with findings suspicious for malignancy.  Referred to Dr. Harlow Asa, total thyroidectomy planned 01/17/18.    Pt can proceed with planned procedure barring acute status change.   VS: BP 125/76   Pulse 77   Temp 36.7 C (Oral)   Resp 16   Ht 5\' 11"  (1.803 m)   Wt 78 kg   BMI 23.99 kg/m   PROVIDERS: [name], MD is PCP  Lanelle Bal, MD is Cardiothoracic Surgeon  Candee Furbish, MD is Cardiologist  LABS: Labs reviewed: Acceptable for surgery. (all labs ordered are listed, but only abnormal results are displayed)  Labs Reviewed  BASIC METABOLIC PANEL - Abnormal; Notable for the following components:      Result Value   Glucose, Bld 110 (*)    All other components within normal limits  HEMOGLOBIN A1C  CBC    IMAGES: Chest CT  10/23/17 IMPRESSION: 1. Bilateral ground-glass, solid, and part solid pulmonary nodules are identified. Within the left upper lobe there is a part solid nodule measuring 2 cm with a 1.4 cm solid component. In the right lower lobe there is a solid subpleural nodule measuring 1 cm. 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 Thoracic surgery consultation is recommended. 2.  Aortic Atherosclerosis (ICD10-I70.0). 3. Multi vessel coronary artery atherosclerotic calcification 4. Thyroid nodule measuring 1.7 cm noted. Consider further evaluation with thyroid ultrasound. If patient is clinically hyperthyroid, consider nuclear medicine thyroid uptake and scan.  US Thyroid 11/21/17 IMPRESSION: 1. Suspicious thyroid nodule in the inferior right thyroid lobe. This is a TR 4 nodule and this nodule was very hypermetabolic on the recent PET-CT. Recommend ultrasound-guided biopsy of the inferior right thyroid nodule. 2. 1.3 cm nodule in the inferior left thyroid lobe does not meet criteria for biopsy based on TI-RADS. However, there is hypermetabolic activity in this area on the recent PET-CT. Therefore, recommend ultrasound-guided biopsy of the inferior left thyroid lobe nodule.  EKG: 09/25/17 Rate 63bpm Left axis deviation Moderate voltage criteria for LVH, may be normal variant  CV: Echo 02/08/16 Study Conclusions  - Left ventricle: The cavity size was normal. Wall thickness was   increased in a pattern of mild LVH. Systolic function was normal.   The estimated ejection fraction was in the range of 55% to 60%.  Wall motion was normal; there were no regional wall motion   abnormalities. Doppler parameters are consistent with abnormal   left ventricular relaxation (grade 1 diastolic dysfunction). - Aortic valve: There was trivial regurgitation. - Left atrium: The atrium was mildly dilated. - Atrial septum: No defect or patent foramen ovale was identified.   Cardiac Cath  02/20/16 1. Severe single vessel CAD (severe stenosis mid RCA) 2. Successful PTCA/DES x 1 mid RCA  3. Mild non-obstructive disease Circumflex and LAD  Stress Test 10/02/17  Nuclear stress EF: 55%.  Blood pressure demonstrated a normal response to exercise.  There was no ST segment deviation noted during stress.  Defect 1: There is a small defect of mild severity present in the apical inferior and apical lateral location.  Findings consistent with mild ischemia.  The left ventricular ejection fraction is normal (55-65%).   Past Medical History:  Diagnosis Date  . Arthritis   . Colon polyps   . Coronary artery disease involving native coronary artery of native heart without angina pectoris 04/23/2016   Myoview 02/08/16: EF 51, inf-lat, apical lateral scar with peri-infarct ischemia; intermediate risk  // LHC 2/18: dLAD 20, oD1 20, pLCx 20, pRCA 80, mRCA 20, dRCA 30, AM 90 >> PCI: 4 x 24 mm Synergy DES to pRCA   . Family history of adverse reaction to anesthesia    Mom vomits and Dad went "nuts"  . Fatigue   . GERD (gastroesophageal reflux disease)   . Hemorrhoids   . History of echocardiogram    Echo 02/08/16: Mild LVH, EF 55-60, no RWMA, Gr 1 DD, trivial AI, mild LAE  . Hyperlipidemia   . Hypertension   . Pre-diabetes   . Pulmonary nodules     Past Surgical History:  Procedure Laterality Date  . CARDIAC CATHETERIZATION    . COLONOSCOPY WITH ESOPHAGOGASTRODUODENOSCOPY (EGD)  2008  . CORONARY STENT INTERVENTION N/A 02/20/2016   Procedure: Coronary Stent Intervention;  Surgeon: Burnell Blanks, MD;  Location: [place] [facility];  Service: Cardiovascular;  Laterality: N/A;  . LEFT HEART CATH AND CORONARY ANGIOGRAPHY N/A 02/20/2016   Procedure: Left Heart Cath and Coronary Angiography;  Surgeon: Burnell Blanks, MD;  Location: [place] [facility] LAB;  Service: Cardiovascular;  Laterality: N/A;  . PILONIDAL CYCT    . REFRACTIVE SURGERY    . VIDEO BRONCHOSCOPY WITH  ENDOBRONCHIAL NAVIGATION N/A 11/18/2017   Procedure: VIDEO BRONCHOSCOPY WITH ENDOBRONCHIAL NAVIGATION WITH BIOPSIES OF LEFT AND RIGHT UPPER LOBES;  Surgeon: Grace Isaac, MD;  Location: [facility];  Service: Thoracic;  Laterality: N/A;    MEDICATIONS: . acetaminophen (TYLENOL) 325 MG tablet  . amLODipine (NORVASC) 10 MG tablet  . aspirin EC 81 MG tablet  . ibuprofen (ADVIL,MOTRIN) 200 MG tablet  . isosorbide mononitrate (IMDUR) 60 MG 24 hr tablet  . losartan (COZAAR) 100 MG tablet  . nitroGLYCERIN (NITROSTAT) 0.4 MG SL tablet  . omega-3 acid ethyl esters (LOVAZA) 1 g capsule  . pantoprazole (PROTONIX) 40 MG tablet  . propranolol (INDERAL) 20 MG tablet  . rosuvastatin (CRESTOR) 20 MG tablet  . traZODone (DESYREL) 100 MG tablet  . zolpidem (AMBIEN) 10 MG tablet   No current facility-administered medications for this encounter.     Maia Plan [facility] Pre-Surgical Testing [phone] 01/15/18 10:39 AM

## 2018-01-17 ENCOUNTER — Ambulatory Visit (HOSPITAL_COMMUNITY): Payer: Medicare Other | Admitting: Physician Assistant

## 2018-01-17 ENCOUNTER — Other Ambulatory Visit: Payer: Self-pay

## 2018-01-17 ENCOUNTER — Encounter (HOSPITAL_COMMUNITY): Payer: Self-pay | Admitting: Emergency Medicine

## 2018-01-17 ENCOUNTER — Observation Stay (HOSPITAL_COMMUNITY)
Admission: RE | Admit: 2018-01-17 | Discharge: 2018-01-18 | Disposition: A | Discharge status: Home / Self Care | Payer: Medicare Other | Attending: Surgery | Admitting: Surgery

## 2018-01-17 ENCOUNTER — Encounter (HOSPITAL_COMMUNITY)
Admission: RE | Disposition: A | Discharge status: Home / Self Care | Payer: Self-pay | Source: Home / Self Care | Attending: Surgery

## 2018-01-17 ENCOUNTER — Ambulatory Visit (HOSPITAL_COMMUNITY): Payer: Medicare Other | Admitting: Anesthesiology

## 2018-01-17 DIAGNOSIS — E7800 Pure hypercholesterolemia, unspecified: Secondary | ICD-10-CM | POA: Diagnosis not present

## 2018-01-17 DIAGNOSIS — Z87891 Personal history of nicotine dependence: Secondary | ICD-10-CM | POA: Insufficient documentation

## 2018-01-17 DIAGNOSIS — I10 Essential (primary) hypertension: Secondary | ICD-10-CM | POA: Insufficient documentation

## 2018-01-17 DIAGNOSIS — E042 Nontoxic multinodular goiter: Secondary | ICD-10-CM | POA: Diagnosis present

## 2018-01-17 DIAGNOSIS — Z8349 Family history of other endocrine, nutritional and metabolic diseases: Secondary | ICD-10-CM | POA: Insufficient documentation

## 2018-01-17 DIAGNOSIS — Z85850 Personal history of malignant neoplasm of thyroid: Secondary | ICD-10-CM | POA: Insufficient documentation

## 2018-01-17 DIAGNOSIS — D440 Neoplasm of uncertain behavior of thyroid gland: Secondary | ICD-10-CM

## 2018-01-17 DIAGNOSIS — I2510 Atherosclerotic heart disease of native coronary artery without angina pectoris: Secondary | ICD-10-CM | POA: Insufficient documentation

## 2018-01-17 DIAGNOSIS — K219 Gastro-esophageal reflux disease without esophagitis: Secondary | ICD-10-CM | POA: Insufficient documentation

## 2018-01-17 DIAGNOSIS — Z79899 Other long term (current) drug therapy: Secondary | ICD-10-CM | POA: Diagnosis not present

## 2018-01-17 DIAGNOSIS — C73 Malignant neoplasm of thyroid gland: Secondary | ICD-10-CM | POA: Diagnosis not present

## 2018-01-17 HISTORY — PX: THYROIDECTOMY: SHX17

## 2018-01-17 SURGERY — THYROIDECTOMY
Anesthesia: General | Site: Neck

## 2018-01-17 MED ORDER — 0.9 % SODIUM CHLORIDE (POUR BTL) OPTIME
TOPICAL | Status: DC | PRN
  Administered 2018-01-17: 1000 mL

## 2018-01-17 MED ORDER — TRAMADOL HCL 50 MG PO TABS
50.0000 mg | ORAL_TABLET | Freq: Four times a day (QID) | ORAL | Status: DC | PRN
  Administered 2018-01-17: 50 mg via ORAL
  Filled 2018-01-17: qty 1

## 2018-01-17 MED ORDER — ONDANSETRON HCL 4 MG/2ML IJ SOLN
INTRAMUSCULAR | Status: DC | PRN
  Administered 2018-01-17: 4 mg via INTRAVENOUS

## 2018-01-17 MED ORDER — STERILE WATER FOR INJECTION IJ SOLN
INTRAMUSCULAR | Status: AC
  Filled 2018-01-17: qty 30

## 2018-01-17 MED ORDER — ACETAMINOPHEN 325 MG PO TABS: 325.0000 mg | ORAL_TABLET | Freq: Four times a day (QID) | ORAL | Status: DC | PRN

## 2018-01-17 MED ORDER — ISOSORBIDE MONONITRATE ER 60 MG PO TB24
60.0000 mg | ORAL_TABLET | Freq: Every day | ORAL | Status: DC
  Administered 2018-01-18: 60 mg via ORAL
  Filled 2018-01-17: qty 1

## 2018-01-17 MED ORDER — LEVOTHYROXINE SODIUM 100 MCG PO TABS: 100.0000 ug | ORAL_TABLET | Freq: Every day | ORAL | 3 refills | Status: DC

## 2018-01-17 MED ORDER — SUGAMMADEX SODIUM 200 MG/2ML IV SOLN
INTRAVENOUS | Status: AC
  Filled 2018-01-17: qty 2

## 2018-01-17 MED ORDER — FENTANYL CITRATE (PF) 250 MCG/5ML IJ SOLN
INTRAMUSCULAR | Status: AC
  Filled 2018-01-17: qty 5

## 2018-01-17 MED ORDER — PANTOPRAZOLE SODIUM 40 MG PO TBEC
40.0000 mg | DELAYED_RELEASE_TABLET | Freq: Every day | ORAL | Status: DC
  Administered 2018-01-18: 40 mg via ORAL
  Filled 2018-01-17: qty 1

## 2018-01-17 MED ORDER — FENTANYL CITRATE (PF) 100 MCG/2ML IJ SOLN
INTRAMUSCULAR | Status: AC
  Administered 2018-01-17: 25 ug via INTRAVENOUS
  Filled 2018-01-17: qty 2

## 2018-01-17 MED ORDER — TRAZODONE HCL 100 MG PO TABS: 100.0000 mg | ORAL_TABLET | Freq: Every evening | ORAL | Status: DC | PRN

## 2018-01-17 MED ORDER — ONDANSETRON HCL 4 MG/2ML IJ SOLN
INTRAMUSCULAR | Status: AC
  Filled 2018-01-17: qty 2

## 2018-01-17 MED ORDER — FENTANYL CITRATE (PF) 100 MCG/2ML IJ SOLN
25.0000 ug | INTRAMUSCULAR | Status: DC | PRN
  Administered 2018-01-17: 50 ug via INTRAVENOUS

## 2018-01-17 MED ORDER — ONDANSETRON 4 MG PO TBDP: 4.0000 mg | ORAL_TABLET | Freq: Four times a day (QID) | ORAL | Status: DC | PRN

## 2018-01-17 MED ORDER — SUGAMMADEX SODIUM 200 MG/2ML IV SOLN
INTRAVENOUS | Status: DC | PRN
  Administered 2018-01-17: 150 mg via INTRAVENOUS

## 2018-01-17 MED ORDER — LOSARTAN POTASSIUM 50 MG PO TABS
100.0000 mg | ORAL_TABLET | Freq: Every day | ORAL | Status: DC
  Administered 2018-01-17 – 2018-01-18 (×2): 100 mg via ORAL
  Filled 2018-01-17 (×2): qty 2

## 2018-01-17 MED ORDER — CEFAZOLIN SODIUM-DEXTROSE 2-4 GM/100ML-% IV SOLN
2.0000 g | INTRAVENOUS | Status: AC
  Administered 2018-01-17: 2 g via INTRAVENOUS
  Filled 2018-01-17: qty 100

## 2018-01-17 MED ORDER — HYDROCODONE-ACETAMINOPHEN 5-325 MG PO TABS: 1.0000 | ORAL_TABLET | ORAL | Status: DC | PRN

## 2018-01-17 MED ORDER — HYDROMORPHONE HCL 1 MG/ML IJ SOLN
1.0000 mg | INTRAMUSCULAR | Status: DC | PRN
  Administered 2018-01-17: 1 mg via INTRAVENOUS
  Filled 2018-01-17: qty 1

## 2018-01-17 MED ORDER — FENTANYL CITRATE (PF) 100 MCG/2ML IJ SOLN
25.0000 ug | INTRAMUSCULAR | Status: DC | PRN
  Administered 2018-01-17 (×2): 25 ug via INTRAVENOUS

## 2018-01-17 MED ORDER — TRAMADOL HCL 50 MG PO TABS: 50.0000 mg | ORAL_TABLET | Freq: Four times a day (QID) | ORAL | 0 refills | Status: DC | PRN

## 2018-01-17 MED ORDER — PHENYLEPHRINE 40 MCG/ML (10ML) SYRINGE FOR IV PUSH (FOR BLOOD PRESSURE SUPPORT)
PREFILLED_SYRINGE | INTRAVENOUS | Status: AC
  Filled 2018-01-17: qty 10

## 2018-01-17 MED ORDER — PROPOFOL 10 MG/ML IV BOLUS
INTRAVENOUS | Status: DC | PRN
  Administered 2018-01-17: 50 mg via INTRAVENOUS
  Administered 2018-01-17: 150 mg via INTRAVENOUS

## 2018-01-17 MED ORDER — ROCURONIUM BROMIDE 10 MG/ML (PF) SYRINGE
PREFILLED_SYRINGE | INTRAVENOUS | Status: DC | PRN
  Administered 2018-01-17: 40 mg via INTRAVENOUS

## 2018-01-17 MED ORDER — PHENYLEPHRINE 40 MCG/ML (10ML) SYRINGE FOR IV PUSH (FOR BLOOD PRESSURE SUPPORT)
PREFILLED_SYRINGE | INTRAVENOUS | Status: DC | PRN
  Administered 2018-01-17 (×3): 80 ug via INTRAVENOUS

## 2018-01-17 MED ORDER — DEXAMETHASONE SODIUM PHOSPHATE 10 MG/ML IJ SOLN
INTRAMUSCULAR | Status: AC
  Filled 2018-01-17: qty 1

## 2018-01-17 MED ORDER — PROPRANOLOL HCL 20 MG PO TABS
20.0000 mg | ORAL_TABLET | Freq: Every day | ORAL | Status: DC
  Administered 2018-01-18: 20 mg via ORAL
  Filled 2018-01-17 (×2): qty 1

## 2018-01-17 MED ORDER — CALCIUM CARBONATE 1250 (500 CA) MG PO TABS
2.0000 | ORAL_TABLET | Freq: Three times a day (TID) | ORAL | Status: DC
  Administered 2018-01-17 – 2018-01-18 (×3): 1000 mg via ORAL
  Filled 2018-01-17 (×3): qty 1

## 2018-01-17 MED ORDER — ACETAMINOPHEN 325 MG PO TABS
650.0000 mg | ORAL_TABLET | Freq: Four times a day (QID) | ORAL | Status: DC | PRN
  Administered 2018-01-17 – 2018-01-18 (×3): 650 mg via ORAL
  Filled 2018-01-17 (×3): qty 2

## 2018-01-17 MED ORDER — PROPOFOL 10 MG/ML IV BOLUS
INTRAVENOUS | Status: AC
  Filled 2018-01-17: qty 20

## 2018-01-17 MED ORDER — PHENYLEPHRINE HCL 10 MG/ML IJ SOLN
INTRAMUSCULAR | Status: AC
  Filled 2018-01-17: qty 1

## 2018-01-17 MED ORDER — ZOLPIDEM TARTRATE 5 MG PO TABS
5.0000 mg | ORAL_TABLET | Freq: Every evening | ORAL | Status: DC | PRN
  Administered 2018-01-17: 5 mg via ORAL
  Filled 2018-01-17: qty 1

## 2018-01-17 MED ORDER — ONDANSETRON HCL 4 MG/2ML IJ SOLN
4.0000 mg | Freq: Four times a day (QID) | INTRAMUSCULAR | Status: DC | PRN
  Administered 2018-01-17: 4 mg via INTRAVENOUS
  Filled 2018-01-17: qty 2

## 2018-01-17 MED ORDER — CALCIUM CARBONATE ANTACID 500 MG PO CHEW: 2.0000 | CHEWABLE_TABLET | Freq: Three times a day (TID) | ORAL | 1 refills | Status: DC

## 2018-01-17 MED ORDER — ACETAMINOPHEN 650 MG RE SUPP: 650.0000 mg | Freq: Four times a day (QID) | RECTAL | Status: DC | PRN

## 2018-01-17 MED ORDER — LIDOCAINE 2% (20 MG/ML) 5 ML SYRINGE
INTRAMUSCULAR | Status: DC | PRN
  Administered 2018-01-17: 60 mg via INTRAVENOUS

## 2018-01-17 MED ORDER — LIDOCAINE 2% (20 MG/ML) 5 ML SYRINGE
INTRAMUSCULAR | Status: AC
  Filled 2018-01-17: qty 5

## 2018-01-17 MED ORDER — ROCURONIUM BROMIDE 10 MG/ML (PF) SYRINGE
PREFILLED_SYRINGE | INTRAVENOUS | Status: AC
  Filled 2018-01-17: qty 10

## 2018-01-17 MED ORDER — FENTANYL CITRATE (PF) 250 MCG/5ML IJ SOLN
INTRAMUSCULAR | Status: DC | PRN
  Administered 2018-01-17: 100 ug via INTRAVENOUS
  Administered 2018-01-17 (×3): 50 ug via INTRAVENOUS

## 2018-01-17 MED ORDER — LACTATED RINGERS IV SOLN
INTRAVENOUS | Status: DC | PRN
  Administered 2018-01-17 (×2): via INTRAVENOUS

## 2018-01-17 MED ORDER — KCL IN DEXTROSE-NACL 20-5-0.45 MEQ/L-%-% IV SOLN
INTRAVENOUS | Status: DC
  Administered 2018-01-17: 11:00:00 via INTRAVENOUS
  Filled 2018-01-17: qty 1000

## 2018-01-17 MED ORDER — DEXAMETHASONE SODIUM PHOSPHATE 10 MG/ML IJ SOLN
INTRAMUSCULAR | Status: DC | PRN
  Administered 2018-01-17: 10 mg via INTRAVENOUS

## 2018-01-17 SURGICAL SUPPLY — 28 items
ADH SKN CLS APL DERMABOND .7 (GAUZE/BANDAGES/DRESSINGS) ×1
ATTRACTOMAT 16X20 MAGNETIC DRP (DRAPES) ×3 IMPLANT
BLADE SURG 15 STRL LF DISP TIS (BLADE) ×1 IMPLANT
BLADE SURG 15 STRL SS (BLADE) ×3
CHLORAPREP W/TINT 26ML (MISCELLANEOUS) ×6 IMPLANT
CLIP VESOCCLUDE MED 6/CT (CLIP) ×8 IMPLANT
CLIP VESOCCLUDE SM WIDE 6/CT (CLIP) ×10 IMPLANT
COVER SURGICAL LIGHT HANDLE (MISCELLANEOUS) ×3 IMPLANT
COVER WAND RF STERILE (DRAPES) ×3 IMPLANT
DERMABOND ADVANCED (GAUZE/BANDAGES/DRESSINGS) ×2
DERMABOND ADVANCED .7 DNX12 (GAUZE/BANDAGES/DRESSINGS) IMPLANT
DRAPE LAPAROTOMY T 98X78 PEDS (DRAPES) ×3 IMPLANT
ELECT PENCIL ROCKER SW 15FT (MISCELLANEOUS) ×3 IMPLANT
ELECT REM PT RETURN 15FT ADLT (MISCELLANEOUS) ×3 IMPLANT
GAUZE 4X4 16PLY RFD (DISPOSABLE) ×3 IMPLANT
GLOVE SURG ORTHO 8.0 STRL STRW (GLOVE) ×3 IMPLANT
GOWN STRL REUS W/TWL XL LVL3 (GOWN DISPOSABLE) ×6 IMPLANT
HEMOSTAT SURGICEL 2X4 FIBR (HEMOSTASIS) IMPLANT
ILLUMINATOR WAVEGUIDE N/F (MISCELLANEOUS) IMPLANT
KIT BASIN OR (CUSTOM PROCEDURE TRAY) ×3 IMPLANT
PACK BASIC VI WITH GOWN DISP (CUSTOM PROCEDURE TRAY) ×3 IMPLANT
SHEARS HARMONIC 9CM CVD (BLADE) ×3 IMPLANT
SUT MNCRL AB 4-0 PS2 18 (SUTURE) ×3 IMPLANT
SUT VIC AB 3-0 SH 18 (SUTURE) ×6 IMPLANT
SYR BULB IRRIGATION 50ML (SYRINGE) ×3 IMPLANT
TOWEL OR 17X26 10 PK STRL BLUE (TOWEL DISPOSABLE) ×3 IMPLANT
TOWEL OR NON WOVEN STRL DISP B (DISPOSABLE) ×3 IMPLANT
YANKAUER SUCT BULB TIP 10FT TU (MISCELLANEOUS) ×3 IMPLANT

## 2018-01-17 NOTE — Interval H&P Note (Signed)
History and Physical Interval Note:  01/17/2018 7:19 AM  Miguel Wolfe  has presented today for surgery, with the diagnosis of Thyroid neoplasm of uncertain behavoir, multiple thyroid nodules  The various methods of treatment have been discussed with the patient and family. After consideration of risks, benefits and other options for treatment, the patient has consented to    Procedure(s): TOTAL THYROIDECTOMY (N/A) as a surgical intervention .    The patient's history has been reviewed, patient examined, no change in status, stable for surgery.  I have reviewed the patient's chart and labs.  Questions were answered to the patient's satisfaction.    Armandina Gemma, [place] Surgery Office: [place]

## 2018-01-17 NOTE — Anesthesia Procedure Notes (Signed)
Procedure Name: Intubation Date/Time: 01/17/2018 7:46 AM Performed by: Mitzie Na, CRNA Pre-anesthesia Checklist: Patient identified, Emergency Drugs available, Suction available, Patient being monitored and Timeout performed Patient Re-evaluated:Patient Re-evaluated prior to induction Oxygen Delivery Method: Circle system utilized Preoxygenation: Pre-oxygenation with 100% oxygen Induction Type: IV induction and Rapid sequence Laryngoscope Size: Mac and 3 Grade View: Grade I Tube type: Oral Tube size: 7.5 mm Number of attempts: 1 Airway Equipment and Method: Stylet Placement Confirmation: ETT inserted through vocal cords under direct vision,  positive ETCO2 and breath sounds checked- equal and bilateral Secured at: 24 cm Tube secured with: Tape Dental Injury: Teeth and Oropharynx as per pre-operative assessment

## 2018-01-17 NOTE — Anesthesia Postprocedure Evaluation (Signed)
Anesthesia Post Note  Patient: Miguel Wolfe  Procedure(s) Performed: TOTAL THYROIDECTOMY (N/A Neck)     Patient location during evaluation: PACU Anesthesia Type: General Level of consciousness: awake Pain management: pain level controlled Vital Signs Assessment: post-procedure vital signs reviewed and stable Respiratory status: spontaneous breathing Cardiovascular status: stable Postop Assessment: no apparent nausea or vomiting Anesthetic complications: no    Last Vitals:  Vitals:   01/17/18 1000 01/17/18 1100  BP: (!) 141/88 140/83  Pulse: 87 84  Resp: 20 18  Temp: 36.8 C 36.7 C  SpO2: 95% 99%    Last Pain:  Vitals:   01/17/18 1144  TempSrc:   PainSc: 0-No pain                 Suresh Audi

## 2018-01-17 NOTE — Transfer of Care (Signed)
Immediate Anesthesia Transfer of Care Note  Patient: Miguel Wolfe  Procedure(s) Performed: TOTAL THYROIDECTOMY (N/A Neck)  Patient Location: PACU  Anesthesia Type:General  Level of Consciousness: awake, alert , oriented and patient cooperative  Airway & Oxygen Therapy: Patient Spontanous Breathing and Patient connected to face mask oxygen  Post-op Assessment: Report given to RN, Post -op Vital signs reviewed and stable and Patient moving all extremities  Post vital signs: Reviewed and stable  Last Vitals:  Vitals Value Taken Time  BP 140/88 01/17/2018  9:22 AM  Temp 37.3 C 01/17/2018  9:22 AM  Pulse 88 01/17/2018  9:23 AM  Resp 19 01/17/2018  9:23 AM  SpO2 98 % 01/17/2018  9:23 AM  Vitals shown include unvalidated device data.  Last Pain:  Vitals:   01/17/18 0554  TempSrc:   PainSc: 0-No pain      Patients Stated Pain Goal: 4 (30/09/23 3007)  Complications: No apparent anesthesia complications

## 2018-01-17 NOTE — Discharge Instructions (Signed)
[facility]  THYROID & PARATHYROID SURGERY:  POST-OP INSTRUCTIONS  Always review your discharge instruction sheet from the facility where your surgery was performed.  A prescription for pain medication may be given to you upon discharge.  Take your pain medication as prescribed.  If narcotic pain medicine is not needed, then you may take acetaminophen (Tylenol) or ibuprofen (Advil) as needed.  Take your usually prescribed medications unless otherwise directed.  If you need a refill on your pain medication, please contact our office during regular business hours.  Prescriptions cannot be processed by our office after 5 pm or on weekends.  Start with a light diet upon arrival home, such as soup and crackers or toast.  Be sure to drink plenty of fluids daily.  Resume your normal diet the day after surgery.  Most patients will experience some swelling and bruising on the chest and neck area.  Ice packs will help.  Swelling and bruising can take several days to resolve.   It is common to experience some constipation after surgery.  Increasing fluid intake and taking a stool softener (Colace) will usually help or prevent this problem.  A mild laxative (Milk of Magnesia or Miralax) should be taken according to package directions if there has been no bowel movement after 48 hours.  You have steri-strips and a gauze dressing over your incision.  You may remove the gauze bandage on the second day after surgery, and you may shower at that time.  Leave your steri-strips (small skin tapes) in place directly over the incision.  These strips should remain on the skin for 5-7 days and then be removed.  You may get them wet in the shower and pat them dry.  You may resume regular (light) daily activities beginning the next day (such as daily self-care, walking, climbing stairs) gradually increasing activities as tolerated.  You may have sexual intercourse when it is comfortable.  Refrain from  any heavy lifting or straining until approved by your doctor.  You may drive when you no longer are taking prescription pain medication, you can comfortably wear a seatbelt, and you can safely maneuver your car and apply brakes.  You should see your doctor in the office for a follow-up appointment approximately three weeks after your surgery.  Make sure that you call for this appointment within a day or two after you arrive home to insure a convenient appointment time.  WHEN TO CALL YOUR DOCTOR: -- Fever greater than 101.5 -- Inability to urinate -- Nausea and/or vomiting - persistent -- Extreme swelling or bruising -- Continued bleeding from incision -- Increased pain, redness, or drainage from the incision -- Difficulty swallowing or breathing -- Muscle cramping or spasms -- Numbness or tingling in hands or around lips  The clinic staff is available to answer your questions during regular business hours.  Please don't hesitate to call and ask to speak to one of the nurses if you have concerns.  Aariah Godette, MD [facility] Office: [phone] 

## 2018-01-17 NOTE — Op Note (Signed)
Procedure Note  Pre-operative Diagnosis:  Multiple thyroid nodules, thyroid neoplasm of uncertain behavior  Post-operative Diagnosis:  same  Surgeon:  Armandina Gemma, MD  Assistant:  none   Procedure:  Total thyroidectomy  Anesthesia:  General  Estimated Blood Loss:  minimal  Drains: none         Specimen: thyroid to pathology  Indications:  Patient is referred by Dr. Lanelle Bal for surgical evaluation and management of suspected thyroid carcinoma. Patient had been undergoing evaluation of bilateral pulmonary nodules. Part of his evaluation included a CT scan which identified thyroid nodules. PET/CT scan also showed activity within the thyroid. Patient underwent an ultrasound examination on November 21, 2017. This showed a normal size thyroid gland. In the inferior right lobe was a 2.1 cm nodule which was felt to be moderately suspicious. It did contain calcifications. It was irregular. On the left lobe there was a 1.3 cm nodule which was not suspicious by ultrasound criteria but was hypermetabolic on PET/CT scanning. Fine-needle aspiration biopsy was performed on both lesions. The left-sided lesion was felt to be benign. The right-sided lesion was a category V lesion suspicious for malignancy. TSH level is normal at 1.52. Patient has had no prior history of thyroid disease. He has had no prior head or neck surgery. He has never been on thyroid medication. There is a family history of thyroid disease in the patient's father who took thyroid medication and a history of thyroid cancer and the patient's daughter who underwent thyroidectomy for papillary thyroid carcinoma. Patient presents today for total thyroidectomy.  Procedure Details: Procedure was done in OR #4 at the [facility]. The patient was brought to the operating room and placed in a supine position on the operating room table. Following administration of general anesthesia, the patient was positioned and  then prepped and draped in the usual aseptic fashion. After ascertaining that an adequate level of anesthesia had been achieved, a small Kocher incision was made with #15 blade. Dissection was carried through subcutaneous tissues and platysma.Hemostasis was achieved with the electrocautery. Skin flaps were elevated cephalad and caudad from the thyroid notch to the sternal notch. A Mahorner self-retaining retractor was placed for exposure. Strap muscles were incised in the midline and dissection was begun on the left side.  Strap muscles were reflected laterally.  Left thyroid lobe was normal in size with nodules.  The left lobe was gently mobilized with blunt dissection. Superior pole vessels were dissected out and divided individually between small and medium ligaclips with the harmonic scalpel. The thyroid lobe was rolled anteriorly. Branches of the inferior thyroid artery were divided between small ligaclips with the harmonic scalpel. Inferior venous tributaries were divided between ligaclips. Both the superior and inferior parathyroid glands were identified and preserved on their vascular pedicles. The recurrent laryngeal nerve was identified and preserved along its course. The ligament of Gwenlyn Found was released with the electrocautery and the gland was mobilized onto the anterior trachea. Isthmus was mobilized across the midline. There was no significant pyramidal lobe present. Dry pack was placed in the left neck.  The right thyroid lobe was gently mobilized with blunt dissection. Right thyroid lobe was normal in size with a firm irregular nodule in the upper portion of the lobe. Superior pole vessels were dissected out and divided between small and medium ligaclips with the Harmonic scalpel. Superior parathyroid was identified and preserved. Inferior venous tributaries were divided between medium ligaclips with the harmonic scalpel. The right thyroid lobe was rolled  anteriorly and the branches of the inferior  thyroid artery divided between small ligaclips. The right recurrent laryngeal nerve was identified and preserved along its course. The ligament of Gwenlyn Found was released with the electrocautery. The right thyroid lobe was mobilized onto the anterior trachea and the remainder of the thyroid was dissected off the anterior trachea and the thyroid was completely excised. A suture was used to mark the right lobe. The entire thyroid gland was submitted to pathology for review.  The CheckPoint neuro-stimulator was employed to confirm continuity of the recurrent laryngeal nerves bilaterally.  Using the 2 mA setting, there was normal response bilaterally.  The neck was irrigated with warm saline. Fibrillar was placed throughout the operative field. Strap muscles were approximated in the midline with interrupted 3-0 Vicryl sutures. Platysma was closed with interrupted 3-0 Vicryl sutures. Skin was closed with a running 4-0 Monocryl subcuticular suture. Wound was washed and Dermabond was applied. The patient was awakened from anesthesia and brought to the recovery room. The patient tolerated the procedure well.   Armandina Gemma, MD [facility] Office: [phone]

## 2018-01-18 ENCOUNTER — Encounter (HOSPITAL_COMMUNITY): Payer: Self-pay | Admitting: Surgery

## 2018-01-18 DIAGNOSIS — Z87891 Personal history of nicotine dependence: Secondary | ICD-10-CM | POA: Diagnosis not present

## 2018-01-18 DIAGNOSIS — Z8349 Family history of other endocrine, nutritional and metabolic diseases: Secondary | ICD-10-CM | POA: Diagnosis not present

## 2018-01-18 DIAGNOSIS — E7800 Pure hypercholesterolemia, unspecified: Secondary | ICD-10-CM | POA: Diagnosis not present

## 2018-01-18 DIAGNOSIS — C73 Malignant neoplasm of thyroid gland: Secondary | ICD-10-CM | POA: Diagnosis not present

## 2018-01-18 DIAGNOSIS — Z79899 Other long term (current) drug therapy: Secondary | ICD-10-CM | POA: Diagnosis not present

## 2018-01-18 DIAGNOSIS — Z85850 Personal history of malignant neoplasm of thyroid: Secondary | ICD-10-CM | POA: Diagnosis not present

## 2018-01-18 LAB — BASIC METABOLIC PANEL
Anion gap: 10 (ref 5–15)
BUN: 16 mg/dL (ref 8–23)
CO2: 27 mmol/L (ref 22–32)
Calcium: 8.3 mg/dL — ABNORMAL LOW (ref 8.9–10.3)
Chloride: 104 mmol/L (ref 98–111)
Creatinine, Ser: 0.68 mg/dL (ref 0.61–1.24)
GFR calc Af Amer: >60 mL/min (ref >60)
GFR calc non Af Amer: >60 mL/min (ref >60)
Glucose, Bld: 146 mg/dL — ABNORMAL HIGH (ref 70–99)
Potassium: 3.5 mmol/L (ref 3.5–5.1)
Sodium: 141 mmol/L (ref 135–145)

## 2018-01-18 NOTE — Discharge Summary (Signed)
Physician Discharge Summary    Patient ID: Miguel Wolfe MRN: [id number] DOB/AGE: 04-28-1945  73 y.o.  Patient Care Team: [name], MD as PCP - General (Family Medicine) Jerline Pain, MD as PCP - Cardiology (Cardiology) Jerline Pain, MD as Consulting Physician (Cardiology)  Admit date: 01/17/2018  Discharge date: 01/18/2018  Hospital Stay = 0 days    Discharge Diagnoses:  Principal Problem:   Neoplasm of uncertain behavior of thyroid gland Active Problems:   Multiple thyroid nodules   1 Day Post-Op  01/17/2018  POST-OPERATIVE DIAGNOSIS:   Thyroid neoplasm of uncertain behavoir, multiple thyroid nodules  SURGERY:  01/17/2018  Procedure(s): TOTAL THYROIDECTOMY  SURGEON:    Surgeon(s): Armandina Gemma, MD  Consults: None  Hospital Course:   The patient underwent the surgery above.  Postoperatively, the patient gradually mobilized and advanced to a solid diet.  Pain and other symptoms were treated aggressively.    By the time of discharge, the patient was walking well the hallways, eating food, having flatus.  No difficulty speaking.  No hoarseness.  Breathing well.  Pain was well-controlled on an oral medications.  Based on meeting discharge criteria and continuing to recover, I felt it was safe for the patient to be discharged from the hospital to further recover with close followup. Postoperative recommendations were discussed in detail with the patient and his wife.  Prescription sent to his pharmacy for thyroid and calcium supplementation as well as tramadol for pain control.  Directions written as well.  Discharged Condition: good  Discharge Exam: Blood pressure 140/89, pulse 91, temperature 98.2 F (36.8 C), temperature source Oral, resp. rate 16, height 5\' 11"  (1.803 m), weight 78 kg, SpO2 97 %.  General: Pt awake/alert/oriented x4 in No acute distress Eyes: PERRL, normal EOM.  Sclera clear.  No icterus Neuro: CN II-XII intact w/o focal sensory/motor  deficits. Lymph: No head/neck/groin lymphadenopathy Psych:  No delerium/psychosis/paranoia HENT: Normocephalic, Mucus membranes moist.  No thrush  Neck: Supple, No tracheal deviation.  Collar incision with normal healing ridge.  No hoarseness.  Speaking with good volume.  No dysphasia.  Chest:  No chest wall pain w good excursion CV:  Pulses intact.  Regular rhythm MS: Normal AROM mjr joints.  No obvious deformity Abdomen: Soft.  Nondistended.  Nontender.  No evidence of peritonitis.  No incarcerated hernias. Ext:  SCDs BLE.  No mjr edema.  No cyanosis Skin: No petechiae / purpura   Disposition:   Follow-up Information    Armandina Gemma, MD. Schedule an appointment as soon as possible for a visit in 3 weeks.   Specialty:  General Surgery Why:  For wound re-check Contact information: [place] [address] [phone]           Discharge disposition: 01-Home or Self Care       Discharge Instructions    Call MD for:   Complete by:  As directed    FEVER > 101.5 F  (temperatures < 101.5 F are not significant)   Call MD for:  extreme fatigue   Complete by:  As directed    Call MD for:  persistant dizziness or light-headedness   Complete by:  As directed    Call MD for:  persistant nausea and vomiting   Complete by:  As directed    Call MD for:  redness, tenderness, or signs of infection (pain, swelling, redness, odor or green/yellow discharge around incision site)   Complete by:  As directed  Call MD for:  severe uncontrolled pain   Complete by:  As directed    Diet - low sodium heart healthy   Complete by:  As directed    Start with a bland diet such as soups, liquids, starchy foods, low fat foods, etc. the first few days at home. Gradually advance to a solid, low-fat, high fiber diet by the end of the first week at home.   Add a fiber supplement to your diet (Metamucil, etc) If you feel full, bloated, or constipated, stay on a full  liquid or pureed/blenderized diet for a few days until you feel better and are no longer constipated.   Discharge instructions   Complete by:  As directed    See Discharge Instructions If you are not getting better after two weeks or are noticing you are getting worse, contact our office [phone] for further advice.  We may need to adjust your medications, re-evaluate you in the office, send you to the emergency room, or see what other things we can do to help. The clinic staff is available to answer your questions during regular business hours (8:30am-5pm).  Please don't hesitate to call and ask to speak to one of our nurses for clinical concerns.    A surgeon from [facility] is always on call at the hospitals 24 hours/day If you have a medical emergency, go to the nearest emergency room or call 911.   Discharge wound care:   Complete by:  As directed    It is good for closed incisions and even open wounds to be washed every day.  Shower every day.  Short baths are fine.  Wash the incisions and wounds clean with soap & water.     You may leave closed incisions open to air if it is dry.   You may cover the incision with clean gauze & replace it after your daily shower for comfort.   You have skin glue (Dermabond) on your incision, leave it in place.  They will fall off on their own like a scab.  You may trim any edges that curl up with clean scissors.   Driving Restrictions   Complete by:  As directed    You may drive when: - you are no longer taking narcotic prescription pain medication - you can comfortably wear a seatbelt - you can safely make sudden turns/stops without pain.   Increase activity slowly   Complete by:  As directed    Start light daily activities --- self-care, walking, climbing stairs- beginning the day after surgery.  Gradually increase activities as tolerated.  Control your pain to be active.  Stop when you are tired.  Ideally, walk several times a  day, eventually an hour a day.   Most people are back to most day-to-day activities in a few weeks.  It takes 4-6 weeks to get back to unrestricted, intense activity. If you can walk 30 minutes without difficulty, it is safe to try more intense activity such as jogging, treadmill, bicycling, low-impact aerobics, swimming, etc. Save the most intensive and strenuous activity for last (Usually 4-8 weeks after surgery) such as sit-ups, heavy lifting, contact sports, etc.  Refrain from any intense heavy lifting or straining until you are off narcotics for pain control.  You will have off days, but things should improve week-by-week. DO NOT PUSH THROUGH PAIN.  Let pain be your guide: If it hurts to do something, don't do it.   Lifting restrictions  Complete by:  As directed    If you can walk 30 minutes without difficulty, it is safe to try more intense activity such as jogging, treadmill, bicycling, low-impact aerobics, swimming, etc. Save the most intensive and strenuous activity for last (Usually 4-8 weeks after surgery) such as sit-ups, heavy lifting, contact sports, etc.   Refrain from any intense heavy lifting or straining until you are off narcotics for pain control.  You will have off days, but things should improve week-by-week. DO NOT PUSH THROUGH PAIN.  Let pain be your guide: If it hurts to do something, don't do it.  Pain is your body warning you to avoid that activity for another week until the pain goes down.   May shower / Bathe   Complete by:  As directed    May walk up steps   Complete by:  As directed    Sexual Activity Restrictions   Complete by:  As directed    You may have sexual intercourse when it is comfortable. If it hurts to do something, stop.      Allergies as of 01/18/2018      Reactions   Gemfibrozil Other (See Comments)   Muscle pain   Niaspan [niacin Er] Rash, Other (See Comments)   Flushing - aspirin did not mitigate    Simvastatin Other (See Comments)    Drug-Drug interaction with amlodipine      Medication List    TAKE these medications   acetaminophen 325 MG tablet Commonly known as:  TYLENOL Take 325 mg by mouth every 6 (six) hours as needed for moderate pain or headache.   amLODipine 10 MG tablet Commonly known as:  NORVASC Take 10 mg by mouth daily.   aspirin EC 81 MG tablet Take 81 mg by mouth every evening.   calcium carbonate 500 MG chewable tablet Commonly known as:  TUMS Chew 2 tablets (400 mg of elemental calcium total) by mouth 3 (three) times daily.   ibuprofen 200 MG tablet Commonly known as:  ADVIL,MOTRIN Take 200 mg by mouth every 6 (six) hours as needed for headache or moderate pain.   isosorbide mononitrate 60 MG 24 hr tablet Commonly known as:  IMDUR Take 1 tablet (60 mg total) by mouth daily.   levothyroxine 100 MCG tablet Commonly known as:  SYNTHROID Take 1 tablet (100 mcg total) by mouth daily.   losartan 100 MG tablet Commonly known as:  COZAAR Take 100 mg by mouth daily.   nitroGLYCERIN 0.4 MG SL tablet Commonly known as:  NITROSTAT Place 1 tablet (0.4 mg total) under the tongue every 5 (five) minutes as needed for chest pain.   omega-3 acid ethyl esters 1 g capsule Commonly known as:  LOVAZA Take 2 g by mouth 2 (two) times daily.   pantoprazole 40 MG tablet Commonly known as:  PROTONIX TAKE 1 TABLET(40 MG) BY MOUTH DAILY What changed:  See the new instructions.   propranolol 20 MG tablet Commonly known as:  INDERAL Take 20 mg by mouth daily.   rosuvastatin 20 MG tablet Commonly known as:  CRESTOR TAKE 1 TABLET(20 MG) BY MOUTH DAILY What changed:  See the new instructions.   traMADol 50 MG tablet Commonly known as:  ULTRAM Take 1-2 tablets (50-100 mg total) by mouth every 6 (six) hours as needed.   traZODone 100 MG tablet Commonly known as:  DESYREL Take 100 mg by mouth at bedtime as needed for sleep.   zolpidem 10 MG tablet Commonly known as:  AMBIEN Take 10 mg by mouth at  bedtime as needed for sleep.            Discharge Care Instructions  (From admission, onward)         Start     Ordered   01/18/18 0000  Discharge wound care:    Comments:  It is good for closed incisions and even open wounds to be washed every day.  Shower every day.  Short baths are fine.  Wash the incisions and wounds clean with soap & water.     You may leave closed incisions open to air if it is dry.   You may cover the incision with clean gauze & replace it after your daily shower for comfort.   You have skin glue (Dermabond) on your incision, leave it in place.  They will fall off on their own like a scab.  You may trim any edges that curl up with clean scissors.   01/18/18 0807          Significant Diagnostic Studies:  Results for orders placed or performed during the hospital encounter of 01/17/18 (from the past 72 hour(s))  Basic metabolic panel     Status: Abnormal   Collection Time: 01/18/18  4:37 AM  Result Value Ref Range   Sodium 141 135 - 145 mmol/L   Potassium 3.5 3.5 - 5.1 mmol/L   Chloride 104 98 - 111 mmol/L   CO2 27 22 - 32 mmol/L   Glucose, Bld 146 (H) 70 - 99 mg/dL   BUN 16 8 - 23 mg/dL   Creatinine, Ser 0.68 0.61 - 1.24 mg/dL   Calcium 8.3 (L) 8.9 - 10.3 mg/dL   GFR calc non Af Amer >60 >60 mL/min   GFR calc Af Amer >60 >60 mL/min   Anion gap 10 5 - 15    Comment: Performed at [facility], [place] [address], [place], [place] [address]    Dg Chest 2 View  Result Date: 01/13/2018 CLINICAL DATA:  Preoperative assessment with cough and fever. Hypertension. EXAM: CHEST - 2 VIEW COMPARISON:  November 18, 2017 FINDINGS: There is slight scarring in the left base. The lungs elsewhere are clear. The heart size and pulmonary vascularity are normal. No adenopathy. There is aortic atherosclerosis. No evident bone lesions. IMPRESSION: Mild scarring left base. No edema or consolidation. Heart size normal. There is aortic atherosclerosis. Aortic  Atherosclerosis (ICD10-I70.0). Electronically Signed   By: Lowella Grip III M.D.   On: 01/13/2018 13:54    Past Medical History:  Diagnosis Date  . Arthritis   . Colon polyps   . Coronary artery disease involving native coronary artery of native heart without angina pectoris 04/23/2016   Myoview 02/08/16: EF 51, inf-lat, apical lateral scar with peri-infarct ischemia; intermediate risk  // LHC 2/18: dLAD 20, oD1 20, pLCx 20, pRCA 80, mRCA 20, dRCA 30, AM 90 >> PCI: 4 x 24 mm Synergy DES to pRCA   . Family history of adverse reaction to anesthesia    Mom vomits and Dad went "nuts"  . Fatigue   . GERD (gastroesophageal reflux disease)   . Hemorrhoids   . History of echocardiogram    Echo 02/08/16: Mild LVH, EF 55-60, no RWMA, Gr 1 DD, trivial AI, mild LAE  . Hyperlipidemia   . Hypertension   . Pre-diabetes   . Pulmonary nodules     Past Surgical History:  Procedure Laterality Date  . CARDIAC CATHETERIZATION    .  COLONOSCOPY WITH ESOPHAGOGASTRODUODENOSCOPY (EGD)  2008  . CORONARY STENT INTERVENTION N/A 02/20/2016   Procedure: Coronary Stent Intervention;  Surgeon: Burnell Blanks, MD;  Location: [facility];  Service: Cardiovascular;  Laterality: N/A;  . LEFT HEART CATH AND CORONARY ANGIOGRAPHY N/A 02/20/2016   Procedure: Left Heart Cath and Coronary Angiography;  Surgeon: Burnell Blanks, MD;  Location: [facility];  Service: Cardiovascular;  Laterality: N/A;  . PILONIDAL CYCT    . REFRACTIVE SURGERY    . THYROIDECTOMY N/A 01/17/2018   Procedure: TOTAL THYROIDECTOMY;  Surgeon: Armandina Gemma, MD;  Location: [facility];  Service: General;  Laterality: N/A;  . VIDEO BRONCHOSCOPY WITH ENDOBRONCHIAL NAVIGATION N/A 11/18/2017   Procedure: VIDEO BRONCHOSCOPY WITH ENDOBRONCHIAL NAVIGATION WITH BIOPSIES OF LEFT AND RIGHT UPPER LOBES;  Surgeon: Grace Isaac, MD;  Location: [facility];  Service: Thoracic;  Laterality: N/A;    Social History   Socioeconomic History    . Marital status: Married    Spouse name: Not on file  . Number of children: Not on file  . Years of education: Not on file  . Highest education level: Not on file  Occupational History  . Not on file  Social Needs  . Financial resource strain: Not on file  . Food insecurity:    Worry: Not on file    Inability: Not on file  . Transportation needs:    Medical: Not on file    Non-medical: Not on file  Tobacco Use  . Smoking status: Former Smoker    Types: Cigarettes    Last attempt to quit: 02/01/1967    Years since quitting: 50.9  . Smokeless tobacco: Never Used  Substance and Sexual Activity  . Alcohol use: Yes    Comment: a drink before dinner a few times a week  . Drug use: No  . Sexual activity: Not on file  Lifestyle  . Physical activity:    Days per week: Not on file    Minutes per session: Not on file  . Stress: Not on file  Relationships  . Social connections:    Talks on phone: Not on file    Gets together: Not on file    Attends religious service: Not on file    Active member of club or organization: Not on file    Attends meetings of clubs or organizations: Not on file    Relationship status: Not on file  . Intimate partner violence:    Fear of current or ex partner: Not on file    Emotionally abused: Not on file    Physically abused: Not on file    Forced sexual activity: Not on file  Other Topics Concern  . Not on file  Social History Narrative  . Not on file    Family History  Problem Relation Age of Onset  . Heart attack Father 70       BYPASS  . Crohn's disease Sister   . Colon cancer Paternal Uncle   . Healthy Sister     Current Facility-Administered Medications  Medication Dose Route Frequency Provider Last Rate Last Dose  . acetaminophen (TYLENOL) tablet 650 mg  650 mg Oral Q6H PRN Armandina Gemma, MD   650 mg at 01/18/18 0046   Or  . acetaminophen (TYLENOL) suppository 650 mg  650 mg Rectal Q6H PRN Armandina Gemma, MD      . calcium  carbonate (OS-CAL - dosed in mg of elemental calcium) tablet 1,000 mg of  elemental calcium  2 tablet Oral TID WC Armandina Gemma, MD   1,000 mg of elemental calcium at 01/18/18 0727  . dextrose 5 % and 0.45 % NaCl with KCl 20 mEq/L infusion   Intravenous Continuous Armandina Gemma, MD 50 mL/hr at 01/17/18 1117    . HYDROcodone-acetaminophen (NORCO/VICODIN) 5-325 MG per tablet 1-2 tablet  1-2 tablet Oral Q4H PRN Armandina Gemma, MD      . HYDROmorphone (DILAUDID) injection 1 mg  1 mg Intravenous Q2H PRN Armandina Gemma, MD   1 mg at 01/17/18 1114  . isosorbide mononitrate (IMDUR) 24 hr tablet 60 mg  60 mg Oral Daily Armandina Gemma, MD   60 mg at 01/18/18 0726  . losartan (COZAAR) tablet 100 mg  100 mg Oral Daily Armandina Gemma, MD   100 mg at 01/18/18 0727  . ondansetron (ZOFRAN-ODT) disintegrating tablet 4 mg  4 mg Oral Q6H PRN Armandina Gemma, MD       Or  . ondansetron (ZOFRAN) injection 4 mg  4 mg Intravenous Q6H PRN Armandina Gemma, MD   4 mg at 01/17/18 1120  . pantoprazole (PROTONIX) EC tablet 40 mg  40 mg Oral Daily Armandina Gemma, MD   40 mg at 01/18/18 0727  . propranolol (INDERAL) tablet 20 mg  20 mg Oral Daily Armandina Gemma, MD   20 mg at 01/18/18 0726  . traMADol (ULTRAM) tablet 50 mg  50 mg Oral Q6H PRN Armandina Gemma, MD   50 mg at 01/17/18 1418  . traZODone (DESYREL) tablet 100 mg  100 mg Oral QHS PRN Armandina Gemma, MD      . zolpidem (AMBIEN) tablet 5 mg  5 mg Oral QHS PRN Armandina Gemma, MD   5 mg at 01/17/18 2118     Allergies  Allergen Reactions  . Gemfibrozil Other (See Comments)    Muscle pain  . Niaspan [Niacin Er] Rash and Other (See Comments)    Flushing - aspirin did not mitigate   . Simvastatin Other (See Comments)    Drug-Drug interaction with amlodipine    Signed: Morton Peters, MD, FACS, MASCRS Gastrointestinal and Minimally Invasive Surgery    [address] [place], [place] [address] [phone] Main / Paging [phone] Fax   01/18/2018,  8:07 AM

## 2018-01-20 ENCOUNTER — Other Ambulatory Visit: Payer: Self-pay | Admitting: Cardiothoracic Surgery

## 2018-01-20 DIAGNOSIS — R911 Solitary pulmonary nodule: Secondary | ICD-10-CM

## 2018-01-21 ENCOUNTER — Encounter: Payer: Self-pay | Admitting: Cardiothoracic Surgery

## 2018-01-23 DIAGNOSIS — I10 Essential (primary) hypertension: Secondary | ICD-10-CM | POA: Diagnosis not present

## 2018-01-23 DIAGNOSIS — R2681 Unsteadiness on feet: Secondary | ICD-10-CM | POA: Diagnosis not present

## 2018-01-23 DIAGNOSIS — Z85850 Personal history of malignant neoplasm of thyroid: Secondary | ICD-10-CM | POA: Diagnosis not present

## 2018-01-24 ENCOUNTER — Emergency Department (HOSPITAL_BASED_OUTPATIENT_CLINIC_OR_DEPARTMENT_OTHER)
Admission: EM | Admit: 2018-01-24 | Discharge: 2018-01-24 | Disposition: A | Discharge status: Home / Self Care | Payer: Medicare Other | Attending: Emergency Medicine | Admitting: Emergency Medicine

## 2018-01-24 ENCOUNTER — Encounter (HOSPITAL_BASED_OUTPATIENT_CLINIC_OR_DEPARTMENT_OTHER): Payer: Self-pay | Admitting: Emergency Medicine

## 2018-01-24 ENCOUNTER — Other Ambulatory Visit: Payer: Self-pay

## 2018-01-24 DIAGNOSIS — Z79899 Other long term (current) drug therapy: Secondary | ICD-10-CM | POA: Diagnosis not present

## 2018-01-24 DIAGNOSIS — I10 Essential (primary) hypertension: Secondary | ICD-10-CM | POA: Diagnosis not present

## 2018-01-24 DIAGNOSIS — I2510 Atherosclerotic heart disease of native coronary artery without angina pectoris: Secondary | ICD-10-CM | POA: Insufficient documentation

## 2018-01-24 DIAGNOSIS — R112 Nausea with vomiting, unspecified: Secondary | ICD-10-CM | POA: Insufficient documentation

## 2018-01-24 DIAGNOSIS — R42 Dizziness and giddiness: Secondary | ICD-10-CM | POA: Diagnosis not present

## 2018-01-24 DIAGNOSIS — Z87891 Personal history of nicotine dependence: Secondary | ICD-10-CM | POA: Diagnosis not present

## 2018-01-24 DIAGNOSIS — Z7982 Long term (current) use of aspirin: Secondary | ICD-10-CM | POA: Insufficient documentation

## 2018-01-24 LAB — COMPREHENSIVE METABOLIC PANEL
ALT: 23 U/L (ref 0–44)
AST: 23 U/L (ref 15–41)
Albumin: 5.2 g/dL — ABNORMAL HIGH (ref 3.5–5.0)
Alkaline Phosphatase: 74 U/L (ref 38–126)
Anion gap: 10 (ref 5–15)
BUN: 20 mg/dL (ref 8–23)
CHLORIDE: 103 mmol/L (ref 98–111)
CO2: 26 mmol/L (ref 22–32)
Calcium: 9 mg/dL (ref 8.9–10.3)
Creatinine, Ser: 0.75 mg/dL (ref 0.61–1.24)
GFR calc non Af Amer: >60 mL/min (ref >60)
Glucose, Bld: 128 mg/dL — ABNORMAL HIGH (ref 70–99)
Potassium: 3.4 mmol/L — ABNORMAL LOW (ref 3.5–5.1)
Sodium: 139 mmol/L (ref 135–145)
Total Bilirubin: 1.3 mg/dL — ABNORMAL HIGH (ref 0.3–1.2)
Total Protein: 8.2 g/dL — ABNORMAL HIGH (ref 6.5–8.1)

## 2018-01-24 LAB — CBC WITH DIFFERENTIAL/PLATELET
ABS IMMATURE GRANULOCYTES: 0.03 10*3/uL (ref 0.00–0.07)
BASOS PCT: 0 %
Basophils Absolute: 0 10*3/uL (ref 0.0–0.1)
Eosinophils Absolute: 0.1 10*3/uL (ref 0.0–0.5)
Eosinophils Relative: 1 %
HCT: 51.2 % (ref 39.0–52.0)
Hemoglobin: 16.6 g/dL (ref 13.0–17.0)
Immature Granulocytes: 0 %
Lymphocytes Relative: 14 %
Lymphs Abs: 1.2 10*3/uL (ref 0.7–4.0)
MCH: 29.2 pg (ref 26.0–34.0)
MCHC: 32.4 g/dL (ref 30.0–36.0)
MCV: 90 fL (ref 80.0–100.0)
Monocytes Absolute: 0.8 10*3/uL (ref 0.1–1.0)
Monocytes Relative: 10 %
NEUTROS ABS: 6.2 10*3/uL (ref 1.7–7.7)
Neutrophils Relative %: 75 %
PLATELETS: 193 10*3/uL (ref 150–400)
RBC: 5.69 MIL/uL (ref 4.22–5.81)
RDW: 12.7 % (ref 11.5–15.5)
WBC: 8.3 10*3/uL (ref 4.0–10.5)
nRBC: 0 % (ref 0.0–0.2)

## 2018-01-24 LAB — TROPONIN I: Troponin I: <0.03 ng/mL (ref <0.03)

## 2018-01-24 LAB — MAGNESIUM: Magnesium: 2.6 mg/dL — ABNORMAL HIGH (ref 1.7–2.4)

## 2018-01-24 LAB — TSH: TSH: 10.736 u[IU]/mL — ABNORMAL HIGH (ref 0.350–4.500)

## 2018-01-24 MED ORDER — SODIUM CHLORIDE 0.9 % IV BOLUS
1000.0000 mL | Freq: Once | INTRAVENOUS | Status: AC
  Administered 2018-01-24: 1000 mL via INTRAVENOUS

## 2018-01-24 MED ORDER — ONDANSETRON HCL 4 MG/2ML IJ SOLN
4.0000 mg | Freq: Once | INTRAMUSCULAR | Status: AC
  Administered 2018-01-24: 4 mg via INTRAVENOUS
  Filled 2018-01-24: qty 2

## 2018-01-24 MED ORDER — ONDANSETRON HCL 4 MG PO TABS: 4.0000 mg | ORAL_TABLET | Freq: Four times a day (QID) | ORAL | 0 refills | Status: DC | PRN

## 2018-01-24 NOTE — ED Provider Notes (Signed)
[facility] Provider Note   CSN: [id number] Arrival date & time: 01/24/18  3500     History   Chief Complaint Chief Complaint  Patient presents with  . Emesis    HPI Miguel Wolfe is a 73 y.o. male.  He had a total thyroidectomy 1 week ago.  He said since leaving the hospital he is felt dizzy which he describes as feeling unsteady on his feet.  He saw his primary care doctor yesterday for it and was told that his calcium was low and was put on calcium and vitamin D.  Starting last evening he has had multiple episodes of vomiting throughout the night.  Says his abdomen is sore but he does not think he has real abdominal pain.  No known fevers but he says he feels flushed.  He tried a Zofran and has not vomited since 4 AM but still feels nauseous.  No chest pain no short of breath no diarrhea no urinary symptoms.  He is not noticing any muscle spasms.  No room spinning sensation.  No headache no numbness no focal weakness.  The history is provided by the patient.  Emesis  Severity:  Severe Duration:  12 hours Timing:  Intermittent Quality:  Stomach contents Progression:  Resolved Chronicity:  New Recent urination:  Normal Context: not post-tussive and not self-induced   Relieved by: zofran. Worsened by:  Nothing Associated symptoms: fever (??)   Associated symptoms: no abdominal pain, no arthralgias, no cough, no diarrhea, no headaches, no myalgias, no sore throat and no URI     Past Medical History:  Diagnosis Date  . Arthritis   . Colon polyps   . Coronary artery disease involving native coronary artery of native heart without angina pectoris 04/23/2016   Myoview 02/08/16: EF 51, inf-lat, apical lateral scar with peri-infarct ischemia; intermediate risk  // LHC 2/18: dLAD 20, oD1 20, pLCx 20, pRCA 80, mRCA 20, dRCA 30, AM 90 >> PCI: 4 x 24 mm Synergy DES to pRCA   . Family history of adverse reaction to anesthesia    Mom vomits and Dad went  "nuts"  . Fatigue   . GERD (gastroesophageal reflux disease)   . Hemorrhoids   . History of echocardiogram    Echo 02/08/16: Mild LVH, EF 55-60, no RWMA, Gr 1 DD, trivial AI, mild LAE  . Hyperlipidemia   . Hypertension   . Pre-diabetes   . Pulmonary nodules     Patient Active Problem List   Diagnosis Date Noted  . Multiple thyroid nodules 01/15/2018  . 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 Coronary artery disease involving native coronary artery of native heart without angina pectoris 04/23/2016  . Hyperlipidemia   . GERD (gastroesophageal reflux disease)   . Essential hypertension 02/16/2016    Past Surgical History:  Procedure Laterality Date  . CARDIAC CATHETERIZATION    . COLONOSCOPY WITH ESOPHAGOGASTRODUODENOSCOPY (EGD)  2008  . CORONARY STENT INTERVENTION N/A 02/20/2016   Procedure: Coronary Stent Intervention;  Surgeon: Burnell Blanks, MD;  Location: [facility];  Service: Cardiovascular;  Laterality: N/A;  . LEFT HEART CATH AND CORONARY ANGIOGRAPHY N/A 02/20/2016   Procedure: Left Heart Cath and Coronary Angiography;  Surgeon: Burnell Blanks, MD;  Location: [facility];  Service: Cardiovascular;  Laterality: N/A;  . PILONIDAL CYCT    . REFRACTIVE SURGERY    . THYROIDECTOMY N/A 01/17/2018   Procedure: TOTAL THYROIDECTOMY;  Surgeon: Harlow Asa,  Sherren Mocha, MD;  Location: [facility];  Service: General;  Laterality: N/A;  . VIDEO BRONCHOSCOPY WITH ENDOBRONCHIAL NAVIGATION N/A 11/18/2017   Procedure: VIDEO BRONCHOSCOPY WITH ENDOBRONCHIAL NAVIGATION WITH BIOPSIES OF LEFT AND RIGHT UPPER LOBES;  Surgeon: Grace Isaac, MD;  Location: [facility];  Service: Thoracic;  Laterality: N/A;        Home Medications    Prior to Admission medications   Medication Sig Start Date End Date Taking? Authorizing Provider  acetaminophen (TYLENOL) 325 MG tablet Take 325 mg by mouth every 6 (six) hours as needed for moderate pain or headache.      [provider]  amLODipine (NORVASC) 10 MG tablet Take 10 mg by mouth daily.    [provider]  aspirin EC 81 MG tablet Take 81 mg by mouth every evening.     [provider]  calcium carbonate (TUMS) 500 MG chewable tablet Chew 2 tablets (400 mg of elemental calcium total) by mouth 3 (three) times daily. 01/17/18   Armandina Gemma, MD  ibuprofen (ADVIL,MOTRIN) 200 MG tablet Take 200 mg by mouth every 6 (six) hours as needed for headache or moderate pain.     [provider]  isosorbide mononitrate (IMDUR) 60 MG 24 hr tablet Take 1 tablet (60 mg total) by mouth daily. 10/15/17   Jerline Pain, MD  levothyroxine (SYNTHROID) 100 MCG tablet Take 1 tablet (100 mcg total) by mouth daily. 01/17/18   Armandina Gemma, MD  losartan (COZAAR) 100 MG tablet Take 100 mg by mouth daily.    [provider]  nitroGLYCERIN (NITROSTAT) 0.4 MG SL tablet Place 1 tablet (0.4 mg total) under the tongue every 5 (five) minutes as needed for chest pain. 02/20/16   Daune Perch, NP  omega-3 acid ethyl esters (LOVAZA) 1 g capsule Take 2 g by mouth 2 (two) times daily.    [provider]  pantoprazole (PROTONIX) 40 MG tablet TAKE 1 TABLET(40 MG) BY MOUTH DAILY Patient taking differently: Take 40 mg by mouth daily.  11/22/17   Jerline Pain, MD  propranolol (INDERAL) 20 MG tablet Take 20 mg by mouth daily. 01/13/16   [provider]  rosuvastatin (CRESTOR) 20 MG tablet TAKE 1 TABLET(20 MG) BY MOUTH DAILY Patient taking differently: Take 20 mg by mouth daily.  11/04/17   Jerline Pain, MD  traMADol (ULTRAM) 50 MG tablet Take 1-2 tablets (50-100 mg total) by mouth every 6 (six) hours as needed. 01/17/18   Armandina Gemma, MD  traZODone (DESYREL) 100 MG tablet Take 100 mg by mouth at bedtime as needed for sleep.    [provider]  zolpidem (AMBIEN) 10 MG tablet Take 10 mg by mouth at bedtime as needed for sleep.  04/29/17   [provider]    Family  History Family History  Problem Relation Age of Onset  . Heart attack Father 23       BYPASS  . Crohn's disease Sister   . Colon cancer Paternal Uncle   . Healthy Sister     Social History Social History   Tobacco Use  . Smoking status: Former Smoker    Types: Cigarettes    Last attempt to quit: 02/01/1967    Years since quitting: 51.0  . Smokeless tobacco: Never Used  Substance Use Topics  . Alcohol use: Yes    Comment: a drink before dinner a few times a week  . Drug use: No     Allergies   Gemfibrozil; Niaspan [  niacin er]; and Simvastatin   Review of Systems Review of Systems  Constitutional: Positive for fever (??).  HENT: Negative for sore throat.   Eyes: Negative for visual disturbance.  Respiratory: Negative for cough and shortness of breath.   Cardiovascular: Negative for chest pain.  Gastrointestinal: Positive for vomiting. Negative for abdominal pain and diarrhea.  Genitourinary: Negative for dysuria.  Musculoskeletal: Negative for arthralgias and myalgias.  Skin: Negative for rash.  Neurological: Positive for dizziness and light-headedness. Negative for seizures, syncope, speech difficulty and headaches.     Physical Exam Updated Vital Signs BP (!) 126/104 (BP Location: Left Arm)   Pulse 98   Temp 98.2 F (36.8 C)   Resp 16   Ht 5\' 11"  (1.803 m)   Wt 78 kg   SpO2 98%   BMI 23.99 kg/m   Physical Exam Vitals signs and nursing note reviewed.  Constitutional:      Appearance: He is well-developed.  HENT:     Head: Normocephalic and atraumatic.  Eyes:     Conjunctiva/sclera: Conjunctivae normal.  Neck:     Musculoskeletal: Normal range of motion and neck supple.     Comments: He has a healing surgical incision over his lower neck with no surrounding erythema. Cardiovascular:     Rate and Rhythm: Normal rate and regular rhythm.     Heart sounds: No murmur.  Pulmonary:     Effort: Pulmonary effort is normal. No respiratory distress.      Breath sounds: Normal breath sounds.  Abdominal:     Palpations: Abdomen is soft.     Tenderness: There is no abdominal tenderness.  Musculoskeletal: Normal range of motion.        General: No tenderness or signs of injury.  Skin:    General: Skin is warm and dry.     Capillary Refill: Capillary refill takes less than 2 seconds.  Neurological:     General: No focal deficit present.     Mental Status: He is alert and oriented to person, place, and time.     Sensory: No sensory deficit.     Motor: No weakness.      ED Treatments / Results  Labs (all labs ordered are listed, but only abnormal results are displayed) Labs Reviewed  COMPREHENSIVE METABOLIC PANEL - Abnormal; Notable for the following components:      Result Value   Potassium 3.4 (*)    Glucose, Bld 128 (*)    Total Protein 8.2 (*)    Albumin 5.2 (*)    Total Bilirubin 1.3 (*)    All other components within normal limits  MAGNESIUM - Abnormal; Notable for the following components:   Magnesium 2.6 (*)    All other components within normal limits  TSH - Abnormal; Notable for the following components:   TSH 10.736 (*)    All other components within normal limits  CBC WITH DIFFERENTIAL/PLATELET  TROPONIN I  CALCIUM, IONIZED    EKG EKG Interpretation  Date/Time:  Friday January 24 2018 10:26:46 EST Ventricular Rate:  71 PR Interval:    QRS Duration: 103 QT Interval:  422 QTC Calculation: 459 R Axis:   -52 Text Interpretation:  Sinus rhythm Left anterior fascicular block Abnormal R-wave progression, early transition Left ventricular hypertrophy similar pattern to prior 2/18 Confirmed by Aletta Edouard [phone]) on 01/24/2018 10:34:53 AM Also confirmed by Aletta Edouard [phone]), editor Philomena Doheny [phone])  on 01/24/2018 11:23:07 AM   Radiology No results found.  Procedures Procedures (including  critical care time)  Medications Ordered in ED Medications  sodium chloride 0.9 % bolus 1,000 mL (0 mLs  Intravenous Stopped 01/24/18 1150)  ondansetron (ZOFRAN) injection 4 mg (4 mg Intravenous Given 01/24/18 1003)  sodium chloride 0.9 % bolus 1,000 mL ( Intravenous Stopped 01/24/18 1316)     Initial Impression / Assessment and Plan / ED Course  I have reviewed the triage vital signs and the nursing notes.  Pertinent labs & imaging results that were available during my care of the patient were reviewed by me and considered in my medical decision making (see chart for details).  Clinical Course as of Jan 25 1723  Fri Jan 24, 2018  1048 Patient's feeling better after the first liter of fluid.  His potassium was just a little bit low and his calcium is in the normal range.  His hemoglobin is up from preop so he is probably volume concentrated.   [MB]  1227 Patient states he feels much better and has tolerated some p.o. here.  He would like to get the rest of the back of fluid and and then he feels ready for discharge.   [MB]    Clinical Course User Index [MB] Hayden Rasmussen, MD   TSH and ionized calcioum pending at time of discharge. Patient to call his pcp for close followup.   Final Clinical Impressions(s) / ED Diagnoses   Final diagnoses:  Non-intractable vomiting with nausea, unspecified vomiting type    ED Discharge Orders         Ordered    ondansetron (ZOFRAN) 4 MG tablet  Every 6 hours PRN     01/24/18 1228           Hayden Rasmussen, MD 01/24/18 1726

## 2018-01-24 NOTE — ED Triage Notes (Addendum)
"   I have been throwing up all night and I had my thyroid taken out last week". Denies diarrhea . Was eval at PCP yesterday due to weakness and was told CA was low and to take Vit D. Took Zofran at 0400 and is still having nausea

## 2018-01-24 NOTE — ED Notes (Signed)
Thyroid surgery 1 week ago had some dizziness, last pm had onset of n/v and some abd pain,

## 2018-01-24 NOTE — Discharge Instructions (Addendum)
You were seen in the emergency department for nausea and vomiting.  You were dehydrated and received some IV fluids and some nausea medication.  We have sent a prescription for Zofran off to the pharmacy for you.  Please keep well-hydrated and start with a bland diet and advance slowly.  Follow-up with your doctors and return if any worsening symptoms.

## 2018-01-25 LAB — CALCIUM, IONIZED: Calcium, Ionized, Serum: 4.7 mg/dL (ref 4.5–5.6)

## 2018-01-30 DIAGNOSIS — D440 Neoplasm of uncertain behavior of thyroid gland: Secondary | ICD-10-CM | POA: Diagnosis not present

## 2018-01-30 DIAGNOSIS — E042 Nontoxic multinodular goiter: Secondary | ICD-10-CM | POA: Diagnosis not present

## 2018-02-14 ENCOUNTER — Other Ambulatory Visit: Payer: Self-pay | Admitting: Internal Medicine

## 2018-02-14 DIAGNOSIS — C73 Malignant neoplasm of thyroid gland: Secondary | ICD-10-CM

## 2018-02-14 DIAGNOSIS — E890 Postprocedural hypothyroidism: Secondary | ICD-10-CM | POA: Diagnosis not present

## 2018-02-14 DIAGNOSIS — Z808 Family history of malignant neoplasm of other organs or systems: Secondary | ICD-10-CM | POA: Diagnosis not present

## 2018-02-17 ENCOUNTER — Ambulatory Visit
Admission: RE | Admit: 2018-02-17 | Discharge: 2018-02-17 | Disposition: A | Discharge status: Home / Self Care | Payer: Medicare Other | Source: Ambulatory Visit | Attending: Internal Medicine | Admitting: Internal Medicine

## 2018-02-17 DIAGNOSIS — C73 Malignant neoplasm of thyroid gland: Secondary | ICD-10-CM

## 2018-02-17 DIAGNOSIS — Z9089 Acquired absence of other organs: Secondary | ICD-10-CM | POA: Diagnosis not present

## 2018-02-25 ENCOUNTER — Other Ambulatory Visit: Payer: Self-pay | Admitting: Internal Medicine

## 2018-02-25 DIAGNOSIS — D2272 Melanocytic nevi of left lower limb, including hip: Secondary | ICD-10-CM | POA: Diagnosis not present

## 2018-02-25 DIAGNOSIS — Z808 Family history of malignant neoplasm of other organs or systems: Secondary | ICD-10-CM | POA: Diagnosis not present

## 2018-02-25 DIAGNOSIS — Z86018 Personal history of other benign neoplasm: Secondary | ICD-10-CM | POA: Diagnosis not present

## 2018-02-25 DIAGNOSIS — E890 Postprocedural hypothyroidism: Secondary | ICD-10-CM | POA: Diagnosis not present

## 2018-02-25 DIAGNOSIS — Z85828 Personal history of other malignant neoplasm of skin: Secondary | ICD-10-CM | POA: Diagnosis not present

## 2018-02-25 DIAGNOSIS — L570 Actinic keratosis: Secondary | ICD-10-CM | POA: Diagnosis not present

## 2018-02-25 DIAGNOSIS — Z23 Encounter for immunization: Secondary | ICD-10-CM | POA: Diagnosis not present

## 2018-02-25 DIAGNOSIS — C73 Malignant neoplasm of thyroid gland: Secondary | ICD-10-CM

## 2018-02-25 DIAGNOSIS — D225 Melanocytic nevi of trunk: Secondary | ICD-10-CM | POA: Diagnosis not present

## 2018-02-26 ENCOUNTER — Ambulatory Visit (INDEPENDENT_AMBULATORY_CARE_PROVIDER_SITE_OTHER): Payer: Medicare Other | Admitting: Cardiothoracic Surgery

## 2018-02-26 ENCOUNTER — Ambulatory Visit
Admission: RE | Admit: 2018-02-26 | Discharge: 2018-02-26 | Disposition: A | Discharge status: Home / Self Care | Payer: Medicare Other | Source: Ambulatory Visit | Attending: Cardiothoracic Surgery | Admitting: Cardiothoracic Surgery

## 2018-02-26 VITALS — BP 130/78 | HR 64 | Resp 20 | Ht 71.0 in | Wt 177.0 lb

## 2018-02-26 DIAGNOSIS — R911 Solitary pulmonary nodule: Secondary | ICD-10-CM

## 2018-02-26 DIAGNOSIS — R918 Other nonspecific abnormal finding of lung field: Secondary | ICD-10-CM

## 2018-02-26 NOTE — Progress Notes (Signed)
[address]       [place] [address]             [phone]                    Miguel Wolfe [facility] Medical Record #[id number] Date of Birth: 1945/07/27  Referring: Jerline Pain, MD Primary Care: [name], MD Primary Cardiologist: Candee Furbish, MD  Chief Complaint:    Chief Complaint  Patient presents with  . Lung Lesion    10 week f/u with Chest CT    History of Present Illness:    Miguel Wolfe 73 y.o. male is seen in the office  Today after his previous evaluation of recent abnormal CT scan.  He had r navigation bronchoscopy and biopsy of right and left upper lobe lesions. , path was neghative His case was discussed at the multidisciplinary thoracic oncology conference this morning and pathology results reviewed.     CT scan of the chest demonstrating multiple pulmonary nodules and groundglass opacities.  The patient is followed by Dr. Marlou Porch for known coronary artery disease.  In February 2018 he had coronary stent placed.  He was maintained on Plavix for a year but since has discontinued it.  He has  seen by Dr. Marlou Porch in the office for vague left chest discomfort.  A stress test was performed  Study Highlights    Nuclear stress EF: 55%.  Blood pressure demonstrated a normal response to exercise.  There was no ST segment deviation noted during stress.  Defect 1: There is a small defect of mild severity present in the apical inferior and apical lateral location.  Findings consistent with mild ischemia.  The left ventricular ejection fraction is normal (55-65%).       In addition to the stress test chest x-ray showed a right lung nodule, this led to a CT scan of the chest which shows multiple pulmonary nodules and groundglass opacities.   The patient is a very limited smoker having smoked for less than 2 years between [place]  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  He he denies any environmental  exposures, not aware of any asbestos exposure.  He does do some woodworking with wood dust exposure.  Patient denies any shortness of breath these had no hemoptysis no fever chills no cough.  Patient has had no chest x-rays or CT scans of the chest in the last 10 years that he is aware of.  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 - MARGINS NOT INVOLVED. - TUMOR CONFINED WITHIN THYROID CAPSULE.      Current Activity/ Functional Status:  Patient is independent with mobility/ambulation, transfers, ADL's, IADL's.   Zubrod Score: At the time of surgery this patient's most appropriate activity status/level should be described as: [x]     0    Normal activity, no symptoms []     1    Restricted in physical strenuous activity but ambulatory, able to do out light work []     2    Ambulatory and capable of self care, unable to do work activities, up and about               >50 % of waking hours                              []   3    Only limited self care, in bed greater than 50% of waking hours []     4    Completely disabled, no self care, confined to bed or chair []     5    Moribund   Past Medical History:  Diagnosis Date  . Arthritis   . Colon polyps   . Coronary artery disease involving native coronary artery of native heart without angina pectoris 04/23/2016   Myoview 02/08/16: EF 51, inf-lat, apical lateral scar with peri-infarct ischemia; intermediate risk  // LHC 2/18: dLAD 20, oD1 20, pLCx 20, pRCA 80, mRCA 20, dRCA 30, AM 90 >> PCI: 4 x 24 mm Synergy DES to pRCA   . Family history of adverse reaction to anesthesia    Mom vomits and Dad went "nuts"  . Fatigue   . GERD (gastroesophageal reflux disease)   . Hemorrhoids   . History of echocardiogram    Echo 02/08/16: Mild LVH, EF 55-60, no RWMA, Gr 1 DD, trivial AI, mild LAE  .  Hyperlipidemia   . Hypertension   . Pre-diabetes   . Pulmonary nodules     Past Surgical History:  Procedure Laterality Date  . CARDIAC CATHETERIZATION    . COLONOSCOPY WITH ESOPHAGOGASTRODUODENOSCOPY (EGD)  2008  . CORONARY STENT INTERVENTION N/A 02/20/2016   Procedure: Coronary Stent Intervention;  Surgeon: Burnell Blanks, MD;  Location: [facility];  Service: Cardiovascular;  Laterality: N/A;  . LEFT HEART CATH AND CORONARY ANGIOGRAPHY N/A 02/20/2016   Procedure: Left Heart Cath and Coronary Angiography;  Surgeon: Burnell Blanks, MD;  Location: [facility];  Service: Cardiovascular;  Laterality: N/A;  . PILONIDAL CYCT    . REFRACTIVE SURGERY    . THYROIDECTOMY N/A 01/17/2018   Procedure: TOTAL THYROIDECTOMY;  Surgeon: Armandina Gemma, MD;  Location: [facility];  Service: General;  Laterality: N/A;  . VIDEO BRONCHOSCOPY WITH ENDOBRONCHIAL NAVIGATION N/A 11/18/2017   Procedure: VIDEO BRONCHOSCOPY WITH ENDOBRONCHIAL NAVIGATION WITH BIOPSIES OF LEFT AND RIGHT UPPER LOBES;  Surgeon: Grace Isaac, MD;  Location: [facility];  Service: Thoracic;  Laterality: N/A;    Family History  Problem Relation Age of Onset  . Heart attack Father 20       BYPASS  . Crohn's disease Sister   . Colon cancer Paternal Uncle   . Healthy Sister    Patient's daughter had a history of thyroid cancer status post thyroidectomy  Social History   Tobacco Use  Smoking Status Former Smoker  . Types: Cigarettes  . Last attempt to quit: 02/01/1967  . Years since quitting: 51.1  Smokeless Tobacco Never Used    Social History   Substance and Sexual Activity  Alcohol Use Yes   Comment: a drink before dinner a few times a week     Allergies  Allergen Reactions  . Gemfibrozil Other (See Comments)    Muscle pain  . Niaspan [Niacin Er] Rash and Other (See Comments)    Flushing - aspirin did not mitigate   . Simvastatin Other (See Comments)    Drug-Drug interaction with amlodipine     Current Outpatient Medications  Medication Sig Dispense Refill  . acetaminophen (TYLENOL) 325 MG tablet Take 325 mg by mouth every 6 (six) hours as needed for moderate pain or headache.     Marland Kitchen amLODipine (NORVASC) 10 MG tablet Take 10 mg by mouth daily.    Marland Kitchen aspirin EC 81 MG tablet Take 81 mg by mouth every evening.     Marland Kitchen  ibuprofen (ADVIL,MOTRIN) 200 MG tablet Take 200 mg by mouth every 6 (six) hours as needed for headache or moderate pain.     . isosorbide mononitrate (IMDUR) 60 MG 24 hr tablet Take 1 tablet (60 mg total) by mouth daily. 90 tablet 3  . levothyroxine (SYNTHROID) 100 MCG tablet Take 1 tablet (100 mcg total) by mouth daily. (Patient taking differently: Take 112 mcg by mouth daily. ) 30 tablet 3  . losartan (COZAAR) 100 MG tablet Take 100 mg by mouth daily.    . nitroGLYCERIN (NITROSTAT) 0.4 MG SL tablet Place 1 tablet (0.4 mg total) under the tongue every 5 (five) minutes as needed for chest pain. 25 tablet 12  . omega-3 acid ethyl esters (LOVAZA) 1 g capsule Take 2 g by mouth 2 (two) times daily.    . ondansetron (ZOFRAN) 4 MG tablet Take 1 tablet (4 mg total) by mouth every 6 (six) hours as needed for nausea or vomiting. 20 tablet 0  . pantoprazole (PROTONIX) 40 MG tablet TAKE 1 TABLET(40 MG) BY MOUTH DAILY (Patient taking differently: Take 40 mg by mouth daily. ) 90 tablet 3  . propranolol (INDERAL) 20 MG tablet Take 20 mg by mouth daily.  2  . rosuvastatin (CRESTOR) 20 MG tablet TAKE 1 TABLET(20 MG) BY MOUTH DAILY (Patient taking differently: Take 20 mg by mouth daily. ) 90 tablet 3  . traMADol (ULTRAM) 50 MG tablet Take 1-2 tablets (50-100 mg total) by mouth every 6 (six) hours as needed. 15 tablet 0  . traZODone (DESYREL) 100 MG tablet Take 100 mg by mouth at bedtime as needed for sleep.    Marland Kitchen zolpidem (AMBIEN) 10 MG tablet Take 10 mg by mouth at bedtime as needed for sleep.   0   No current facility-administered medications for this visit.     Pertinent items are noted  in HPI.     PHYSICAL EXAMINATION: BP 130/78   Pulse 64   Resp 20   Ht 5\' 11"  (1.803 m)   Wt 177 lb (80.3 kg)   SpO2 97% Comment: RA  BMI 24.69 kg/m  General appearance: alert, cooperative and no distress Head: Normocephalic, without obvious abnormality, atraumatic Neck: no adenopathy, no carotid bruit, no JVD, supple, symmetrical, trachea midline and thyriodectomy scar healing well Lymph nodes: Cervical, supraclavicular, and axillary nodes normal. Resp: clear to auscultation bilaterally Back: symmetric, no curvature. ROM normal. No CVA tenderness. Cardio: regular rate and rhythm, S1, S2 normal, no murmur, click, rub or gallop GI: soft, non-tender; bowel sounds normal; no masses,  no organomegaly Extremities: extremities normal, atraumatic, no cyanosis or edema Neurologic: Grossly normal   Diagnostic Studies & Laboratory data:     Recent Radiology Findings:  Ct Chest Wo Contrast  Result Date: 02/26/2018 CLINICAL DATA:  Follow-up lung nodule EXAM: CT CHEST WITHOUT CONTRAST TECHNIQUE: Multidetector CT imaging of the chest was performed following the standard protocol without IV contrast. COMPARISON:  10/23/2017 FINDINGS: Cardiovascular: No significant vascular findings. Coronary artery calcifications and/or stents. No pericardial effusion. Mediastinum/Nodes: No enlarged mediastinal, hilar, or axillary lymph nodes. Status post thyroidectomy. Trachea, and esophagus demonstrate no significant findings. Lungs/Pleura: Multiple redemonstrated bilateral pulmonary nodules and opacities. As on prior examination, there is a mixed composition nodule of the apical left upper lobe with a central solid component measuring at approximately 1.4 cm (series 8, image 41). A previously noted subpleural nodule of the right lower lobe is now resolved with only minimal residual ground-glass (series 8, image 108). Other small pulmonary  nodules and ground-glass opacities are unchanged. No pleural effusion or  pneumothorax. Upper Abdomen: No acute abnormality. Musculoskeletal: No chest wall mass or suspicious bone lesions identified. IMPRESSION: 1. Multiple redemonstrated bilateral pulmonary nodules and opacities. As on prior examination, there is a mixed composition nodule of the apical left upper lobe with a central solid component measuring at approximately 1.4 cm (series 8, image 41). Despite interval stability and low FDG avidity demonstrated on prior PET examination, this remains concerning for indolent or well-differentiated malignancy. Continued annual follow-up is recommended through 5 years at minimum. Biopsy may be considered for definitive diagnosis. 2. A previously noted subpleural nodule of the right lower lobe is now resolved with only minimal residual ground-glass (series 8, image 108). Other small pulmonary nodules and ground-glass opacities are unchanged. Attention on follow-up. 3.  Coronary artery disease. Electronically Signed   By: Eddie Candle M.D.   On: 02/26/2018 15:50   US Thyroid  Result Date: 02/17/2018 CLINICAL DATA:  History of papillary carcinoma and thyroidectomy. Total thyroidectomy on 01/17/2018. EXAM: THYROID ULTRASOUND TECHNIQUE: Ultrasound examination of the thyroid gland and adjacent soft tissues was performed. COMPARISON:  11/21/2017 FINDINGS: Status post total thyroidectomy. No residual thyroid tissue. Small amount of hypoechoic material in the right thyroidectomy bed could represent trace fluid. This hypoechoic area measures roughly 0.7 cm. No suspicious nodularity or lymph node enlargement. Soft tissues appear to be mildly edematous around the surgical bed. IMPRESSION: Total thyroidectomy. No residual thyroid tissue and no suspicious lymphadenopathy or nodularity. Soft tissue appears to be mildly edematous and compatible with recent surgery. Trace fluid near the right thyroidectomy bed is likely postsurgical and recommend attention to this area on follow up imaging.  Electronically Signed   By: Markus Daft M.D.   On: 02/17/2018 15:55    I have independently reviewed the above radiology studies  and reviewed the findings with the patient.     Dg Chest 2 View  Result Date: 10/14/2017 CLINICAL DATA:  LEFT axillary chest pain for 4-6 weeks, no known injury, history hypertension, former smoker, coronary artery disease post stenting EXAM: CHEST - 2 VIEW COMPARISON:  None FINDINGS: Normal heart size, mediastinal contours, and pulmonary vascularity. Coronary stent noted. Atherosclerotic calcification aorta. Minimal subsegmental atelectasis LEFT base. 10 mm nodular density lower lateral RIGHT lung, doubt nipple shadow from inferior location. Remaining lungs clear. No infiltrate, pleural effusion or pneumothorax. No acute osseous findings. IMPRESSION: Minimal LEFT basilar atelectasis with a 10 mm nonspecific nodular density at the lateral RIGHT lung base; pulmonary nodule not excluded and further assessment by CT chest recommended to exclude pulmonary nodule/tumor. These results will be called to the ordering clinician or representative by the Radiologist Assistant, and communication documented in the PACS or zVision Dashboard. Electronically Signed   By: Lavonia Dana M.D.   On: 10/14/2017 14:22   Ct Chest Wo Contrast  Result Date: 10/24/2017 CLINICAL DATA:  Evaluate pulmonary nodule. Abnormal chest radiograph. EXAM: CT CHEST WITHOUT CONTRAST TECHNIQUE: Multidetector CT imaging of the chest was performed following the standard protocol without IV contrast. COMPARISON:  None FINDINGS: Cardiovascular: The heart size appears mildly enlarged. Aortic atherosclerosis. Calcification in the LAD left circumflex and RCA coronary artery noted. Mediastinum/Nodes: No enlarged mediastinal or axillary lymph nodes. The trachea appears patent and is midline. Normal appearance of the esophagus. Right lobe of thyroid gland nodule measures 1.7 cm, image 18/2. Lungs/Pleura: Bilateral mixed  attenuation pulmonary nodules identified. Within the right upper lobe there is a pure ground-glass attenuating nodule which  measures 2.7 cm, image 38/3. Solid-appearing nodule within the right lower lobe measures 1 cm, image 110/3. Ground-glass attenuating nodule within the left upper lobe measures 5 mm, image 55/3. Within the left upper lobe there is a mixed ground-glass and solid attenuating nodule. This measures 2 cm with a central solid component of 1.4 cm, image 61/5. Several adjacent areas of ground-glass nodularity are identified including a 1 cm left apical nodule, image 33/3. Upper Abdomen: No acute abnormality. Musculoskeletal: No chest wall mass or suspicious bone lesions identified. IMPRESSION: 1. Bilateral ground-glass, solid, and part solid pulmonary nodules are identified. Within the left upper lobe there is a part solid nodule measuring 2 cm with a 1.4 cm solid component. In the right lower lobe there is a solid subpleural nodule measuring 1 cm. 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 Thoracic surgery consultation is recommended. 2.  Aortic Atherosclerosis (ICD10-I70.0). 3. Multi vessel coronary artery atherosclerotic calcification 4. Thyroid nodule measuring 1.7 cm noted. Consider further evaluation with thyroid ultrasound. If patient is clinically hyperthyroid, consider nuclear medicine thyroid uptake and scan. Electronically Signed   By: Kerby Moors M.D.   On: 10/24/2017 11:31    Recent Lab Findings: Lab Results  Component Value Date   WBC 8.3 01/24/2018   HGB 16.6 01/24/2018   HCT 51.2 01/24/2018   PLT 193 01/24/2018   GLUCOSE 128 (H) 01/24/2018   CHOL 151 01/21/2017   TRIG 177 (H) 01/21/2017   HDL 48 01/21/2017   LDLCALC 68 01/21/2017   ALT 23 01/24/2018   AST 23 01/24/2018   NA 139 01/24/2018   K 3.4 (L) 01/24/2018   CL 103 01/24/2018   CREATININE 0.75 01/24/2018   BUN 20 01/24/2018   CO2 26 01/24/2018   TSH 10.736 (H) 01/24/2018   INR 1.08 11/18/2017    HGBA1C 5.6 01/13/2018   Cath February 2018, most recent  Acute Mrg lesion, 90 %stenosed.  Mid RCA lesion, 20 %stenosed.  Dist RCA lesion, 30 %stenosed.  Prox Cx to Mid Cx lesion, 20 %stenosed.  1st Diag lesion, 20 %stenosed.  Dist LAD lesion, 20 %stenosed.  A STENT SYNERGY DES [id number] drug eluting stent was successfully placed.  Prox RCA to Mid RCA lesion, 80 %stenosed.  Post intervention, there is a 0% residual stenosis.   1. Severe single vessel CAD (severe stenosis mid RCA) 2. Successful PTCA/DES x 1 mid RCA  3. Mild non-obstructive disease Circumflex and LAD  Recommendations: Will continue DAPT with ASA and Plavix for one year. If he needs to interrupt anti-platelet therapy for a toe surgery, this could be considered at 3 months since a Synergy DES was placed, although it would be optimal to continue for at least 6 months. Will discharge home today as part of the same day PCI protocol. Will changed Prilosec to Protonix 40 mg daily. He will be started on Plavix 75 mg daily. Follow up with Dr. Marlou Porch or office APP in 1 week.   PFT's 3.16 93% 33.61 99% The FVC, FEV1, FEV1/FVC ratio and FEF25-75% are within normal limits, but there is curvature to the flow volume loop suggesting minimal small airway disease. The airway resistance is normal. Lung volumes are within normal limits. Following administration of bronchodilators, there is no significant response. The diffusing capacity is normal. However, the diffusing capacity was not corrected for the patient's hemoglobin. Conclusions: Minimal airway obstruction is present suggesting small airway disease. 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 I have discussed with the patient proceeding with resection of left upper lesion, he prefers to wait 3 months for a follow up scan- will arrange   #2 history of coronary artery disease status post stenting #3 1.7 cm right thyroid nodule- papillary ca of thyroid after total thyroidectomy    Grace Isaac MD      [address] [place] [address] Office [phone]   Beeper [phone]  02/26/2018 8:52 PM

## 2018-04-07 ENCOUNTER — Other Ambulatory Visit: Payer: Self-pay | Admitting: Cardiothoracic Surgery

## 2018-04-07 DIAGNOSIS — R911 Solitary pulmonary nodule: Secondary | ICD-10-CM

## 2018-04-09 DIAGNOSIS — C73 Malignant neoplasm of thyroid gland: Secondary | ICD-10-CM | POA: Diagnosis not present

## 2018-04-28 ENCOUNTER — Ambulatory Visit: Payer: Medicare Other | Admitting: Cardiology

## 2018-04-29 ENCOUNTER — Other Ambulatory Visit: Payer: Self-pay

## 2018-04-29 ENCOUNTER — Telehealth (INDEPENDENT_AMBULATORY_CARE_PROVIDER_SITE_OTHER): Payer: Medicare Other | Admitting: Cardiology

## 2018-04-29 ENCOUNTER — Encounter: Payer: Self-pay | Admitting: Cardiology

## 2018-04-29 VITALS — BP 131/71 | HR 76 | Ht 71.0 in | Wt 174.0 lb

## 2018-04-29 DIAGNOSIS — I209 Angina pectoris, unspecified: Secondary | ICD-10-CM | POA: Diagnosis not present

## 2018-04-29 DIAGNOSIS — E7800 Pure hypercholesterolemia, unspecified: Secondary | ICD-10-CM

## 2018-04-29 DIAGNOSIS — I2510 Atherosclerotic heart disease of native coronary artery without angina pectoris: Secondary | ICD-10-CM

## 2018-04-29 DIAGNOSIS — R079 Chest pain, unspecified: Secondary | ICD-10-CM

## 2018-04-29 DIAGNOSIS — R9389 Abnormal findings on diagnostic imaging of other specified body structures: Secondary | ICD-10-CM

## 2018-04-29 NOTE — Patient Instructions (Signed)
Medication Instructions:  You may hold your Isosorbide to see how you do.  Please continue all other medications as listed.  If you need a refill on your cardiac medications before your next appointment, please call your pharmacy.   Follow-Up: Follow up in 6 months with Dr. Marlou Porch.  You will receive a letter in the mail 2 months before you are due.  Please call us when you receive this letter to schedule your follow up appointment.  Thank you for choosing [facility]!!

## 2018-04-29 NOTE — Progress Notes (Signed)
Virtual Visit via Video Note   This visit type was conducted due to national recommendations for restrictions regarding the COVID-19 Pandemic (e.g. social distancing) in an effort to limit this patient's exposure and mitigate transmission in our community.  Due to his co-morbid illnesses, this patient is at least at moderate risk for complications without adequate follow up.  This format is felt to be most appropriate for this patient at this time.  All issues noted in this document were discussed and addressed.  A limited physical exam was performed with this format.  Please refer to the patient's chart for his consent to telehealth for [facility].   Evaluation Performed:  Follow-up visit  Date:  04/29/2018   ID:  Miguel Wolfe, DOB 1945/01/22, MRN [id number]  Patient Location: Home Provider Location: Home  PCP:  [name], MD  Cardiologist:  Candee Furbish, MD  Electrophysiologist:  None   Chief Complaint: Follow-up coronary artery disease  History of Present Illness:    Miguel Wolfe is a 73 y.o. male with negative pathology of right and left upper lobe lesions from CT scan.  Case was discussed at multidisciplinary thoracic oncology conference.  In February 2018 had stent placed for coronary artery disease.  Subsequent stress test was performed for chest discomfort, small defect with mild severity in the apical inferior and apical lateral region was noted.  Potentially mild ischemia with normal ejection fraction.  This also prompted the chest x-ray which showed a right lung nodule which led to the CT scan which showed multiple pulmonary nodules and groundglass opacities.  With his last visit, Dr. Servando Snare on 02/26/2018, the bilateral groundglass opacities in both lungs with some solid component suspicious for multicentric adenocarcinoma, right lower lobe peripheral nodule almost resolved, groundglass mass right upper lobe and left upper lobe partial groundglass/solid mass unchanged  in size.  He discussed with patient about proceeding with resection of left upper lesion but he preferred to wait for 3 months for a follow-up scan.  That 3 months would have been May 27, 2018.  No significant exposures.  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  Overall he has been doing quite well.  His thyroid medication has been adjusted by Dr. Buddy Duty.  He still has the occasional upper chest discomfort which he wonders if this could be secondary to his lung nodules.  He desire to test the waters without isosorbide.  This is fine.  He will let us know.  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  Wife is active in the church.  The patient does not have symptoms concerning for COVID-19 infection (fever, chills, cough, or new shortness of breath).    Past Medical History:  Diagnosis Date  . Arthritis   . Colon polyps   . Coronary artery disease involving native coronary artery of native heart without angina pectoris 04/23/2016   Myoview 02/08/16: EF 51, inf-lat, apical lateral scar with peri-infarct ischemia; intermediate risk  // LHC 2/18: dLAD 20, oD1 20, pLCx 20, pRCA 80, mRCA 20, dRCA 30, AM 90 >> PCI: 4 x 24 mm Synergy DES to pRCA   . Family history of adverse reaction to anesthesia    Mom vomits and Dad went "nuts"  . Fatigue   . GERD (gastroesophageal reflux disease)   . Hemorrhoids   . History of echocardiogram    Echo 02/08/16: Mild LVH, EF 55-60, no RWMA, Gr 1 DD, trivial AI, mild LAE  .  Hyperlipidemia   . Hypertension   . Pre-diabetes   . Pulmonary nodules    Past Surgical History:  Procedure Laterality Date  . CARDIAC CATHETERIZATION    . COLONOSCOPY WITH ESOPHAGOGASTRODUODENOSCOPY (EGD)  2008  . CORONARY STENT INTERVENTION N/A 02/20/2016   Procedure: Coronary Stent Intervention;  Surgeon: Burnell Blanks, MD;  Location: [place] [facility];  Service: Cardiovascular;  Laterality: N/A;  . LEFT HEART CATH AND CORONARY  ANGIOGRAPHY N/A 02/20/2016   Procedure: Left Heart Cath and Coronary Angiography;  Surgeon: Burnell Blanks, MD;  Location: [place] [facility];  Service: Cardiovascular;  Laterality: N/A;  . PILONIDAL CYCT    . REFRACTIVE SURGERY    . THYROIDECTOMY N/A 01/17/2018   Procedure: TOTAL THYROIDECTOMY;  Surgeon: Armandina Gemma, MD;  Location: [facility];  Service: General;  Laterality: N/A;  . VIDEO BRONCHOSCOPY WITH ENDOBRONCHIAL NAVIGATION N/A 11/18/2017   Procedure: VIDEO BRONCHOSCOPY WITH ENDOBRONCHIAL NAVIGATION WITH BIOPSIES OF LEFT AND RIGHT UPPER LOBES;  Surgeon: Grace Isaac, MD;  Location: [place];  Service: Thoracic;  Laterality: N/A;     Current Meds  Medication Sig  . acetaminophen (TYLENOL) 325 MG tablet Take 250 mg by mouth every 6 (six) hours as needed for moderate pain or headache.   Marland Kitchen amLODipine (NORVASC) 10 MG tablet Take 10 mg by mouth daily.  Marland Kitchen aspirin EC 81 MG tablet Take 81 mg by mouth every evening.   Marland Kitchen ibuprofen (ADVIL,MOTRIN) 200 MG tablet Take 200 mg by mouth every 6 (six) hours as needed for headache or moderate pain.   . isosorbide mononitrate (IMDUR) 60 MG 24 hr tablet Take 1 tablet (60 mg total) by mouth daily.  Marland Kitchen levothyroxine (SYNTHROID) 137 MCG tablet Take 137 mcg by mouth daily.  Marland Kitchen losartan (COZAAR) 100 MG tablet Take 100 mg by mouth daily.  . nitroGLYCERIN (NITROSTAT) 0.4 MG SL tablet Place 1 tablet (0.4 mg total) under the tongue every 5 (five) minutes as needed for chest pain.  Marland Kitchen omega-3 acid ethyl esters (LOVAZA) 1 g capsule Take 2 g by mouth 2 (two) times daily.  . ondansetron (ZOFRAN) 4 MG tablet Take 1 tablet (4 mg total) by mouth every 6 (six) hours as needed for nausea or vomiting.  . pantoprazole (PROTONIX) 40 MG tablet TAKE 1 TABLET(40 MG) BY MOUTH DAILY  . propranolol (INDERAL) 20 MG tablet Take 20 mg by mouth daily.  . rosuvastatin (CRESTOR) 20 MG tablet TAKE 1 TABLET(20 MG) BY MOUTH DAILY  . traMADol (ULTRAM) 50 MG tablet Take 1-2 tablets (50-100  mg total) by mouth every 6 (six) hours as needed.  . traZODone (DESYREL) 100 MG tablet Take 100 mg by mouth at bedtime as needed for sleep.  Marland Kitchen zolpidem (AMBIEN) 10 MG tablet Take 10 mg by mouth at bedtime as needed for sleep.      Allergies:   Gemfibrozil; Niaspan [niacin er]; and Simvastatin   Social History   Tobacco Use  . Smoking status: Former Smoker    Types: Cigarettes    Last attempt to quit: 02/01/1967    Years since quitting: 51.2  . Smokeless tobacco: Never Used  Substance Use Topics  . Alcohol use: Yes    Comment: a drink before dinner a few times a week  . Drug use: No     Family Hx: The patient's family history includes Colon cancer in his paternal uncle; Crohn's disease in his sister; Healthy in his sister; Heart attack (age of onset: 67) in his father.  ROS:   Please see the history of present illness.    Denies any fevers chills nausea vomiting syncope bleeding All other systems reviewed and are negative.   Prior CV studies:   The following studies were reviewed today:  Prior cardiac catheterization reviewed, RCA stent  Labs/Other Tests and Data Reviewed:    EKG:  An ECG dated 01/24/2018 was personally reviewed today and demonstrated:  Coronary artery disease sinus rhythm LAFB  Recent Labs: 01/24/2018: ALT 23; BUN 20; Creatinine, Ser 0.75; Hemoglobin 16.6; Magnesium 2.6; Platelets 193; Potassium 3.4; Sodium 139; TSH 10.736   Recent Lipid Panel Lab Results  Component Value Date/Time   CHOL 151 01/21/2017 08:40 AM   TRIG 177 (H) 01/21/2017 08:40 AM   HDL 48 01/21/2017 08:40 AM   CHOLHDL 3.1 01/21/2017 08:40 AM   LDLCALC 68 01/21/2017 08:40 AM    Wt Readings from Last 3 Encounters:  04/29/18 174 lb (78.9 kg)  02/26/18 177 lb (80.3 kg)  01/24/18 172 lb (78 kg)     Objective:    Vital Signs:  BP 131/71   Pulse 76   Ht 5\' 11"  (1.803 m)   Wt 174 lb (78.9 kg)   BMI 24.27 kg/m    VITAL SIGNS:  reviewed GEN:  no acute distress EYES:  sclerae  anicteric, EOMI - Extraocular Movements Intact RESPIRATORY:  normal respiratory effort, symmetric expansion CARDIOVASCULAR:  no peripheral edema SKIN:  no rash, lesions or ulcers. MUSCULOSKELETAL:  no obvious deformities. NEURO:  alert and oriented x 3, no obvious focal deficit PSYCH:  normal affect  ASSESSMENT & PLAN:    Coronary artery disease with angina - RCA stent 02/20/2016 after inferior lateral MI with peri-infarct ischemia.  He was experiencing dyspnea on exertion after performing yard work.  Father also had CAD. Isosorbide-he is going to try to hold this medication to see if there is any change in his symptoms.  I am fine with this.  If there is no significant benefit to him taking the isosorbide from a symptom standpoint he may stop.  He will let us know  Hyperlipidemia - Prior myalgias with pravastatin.  Crestor seems to be doing a good job.  Prior LDL 68.  Continue with exercise.  [name], physical exam in the summer, will check lipids.  Essential hypertension - Medications reviewed.  No changes.  Papillary thyroid cancer -Resection.  Dr. Buddy Duty has been monitoring and adjusting his Synthroid.  Doing well.  Bilateral groundglass opacities upper lobes of lung - Patient prefers to wait 3 months for follow-up scan which will put him in May 2020.  Dr. Servando Snare had discussed resection of left upper lesion but he preferred to wait until scan was complete.  Prior biopsies were performed and were negative pathology.  COVID-19 Education: The signs and symptoms of COVID-19 were discussed with the patient and how to seek care for testing (follow up with PCP or arrange E-visit).  The importance of social distancing was discussed today.  Time:   Today, I have spent 12 minutes with the patient with telehealth technology discussing the above problems.     Medication Adjustments/Labs and Tests Ordered: Current medicines are reviewed at length with the patient today.  Concerns regarding  medicines are outlined above.   Tests Ordered: No orders of the defined types were placed in this encounter.   Medication Changes: No orders of the defined types were placed in this encounter.   Disposition:  Follow up in 6 month(s)  Signed, Elta Guadeloupe  Marlou Porch, MD  04/29/2018 9:17 AM    [place]

## 2018-05-28 ENCOUNTER — Ambulatory Visit
Admission: RE | Admit: 2018-05-28 | Discharge: 2018-05-28 | Disposition: A | Discharge status: Home / Self Care | Payer: Medicare Other | Source: Ambulatory Visit | Attending: Cardiothoracic Surgery | Admitting: Cardiothoracic Surgery

## 2018-05-28 ENCOUNTER — Other Ambulatory Visit: Payer: Self-pay

## 2018-05-28 DIAGNOSIS — R079 Chest pain, unspecified: Secondary | ICD-10-CM | POA: Diagnosis not present

## 2018-05-28 DIAGNOSIS — R911 Solitary pulmonary nodule: Secondary | ICD-10-CM

## 2018-05-29 ENCOUNTER — Encounter: Payer: Self-pay | Admitting: *Deleted

## 2018-05-29 ENCOUNTER — Other Ambulatory Visit: Payer: Self-pay | Admitting: *Deleted

## 2018-05-29 ENCOUNTER — Encounter: Payer: Self-pay | Admitting: Cardiothoracic Surgery

## 2018-05-29 ENCOUNTER — Ambulatory Visit (INDEPENDENT_AMBULATORY_CARE_PROVIDER_SITE_OTHER): Payer: Medicare Other | Admitting: Cardiothoracic Surgery

## 2018-05-29 VITALS — BP 126/82 | HR 67 | Temp 98.7°F | Resp 16 | Ht 71.0 in | Wt 172.0 lb

## 2018-05-29 DIAGNOSIS — I209 Angina pectoris, unspecified: Secondary | ICD-10-CM | POA: Diagnosis not present

## 2018-05-29 DIAGNOSIS — R918 Other nonspecific abnormal finding of lung field: Secondary | ICD-10-CM | POA: Diagnosis not present

## 2018-05-29 DIAGNOSIS — R911 Solitary pulmonary nodule: Secondary | ICD-10-CM

## 2018-05-29 NOTE — Progress Notes (Signed)
[address]       [place] [address]             [phone]                    Chais L Schutter [place] Medical Record #[id number] Date of Birth: 07-May-1945  Referring: Jerline Pain, MD Primary Care: [name], MD Primary Cardiologist: Candee Furbish, MD  Chief Complaint:    Chief Complaint  Patient presents with   Lung Lesion    3 month f/u with CT CHEST 05/28/18    History of Present Illness:    Miguel Wolfe 73 y.o. male is seen in the office  Today after his previous evaluation of recent abnormal CT scan.  He has had a navigation bronchoscopy and biopsy of right and left upper lobe lesions. , path was neghative His case was discussed at the multidisciplinary thoracic oncology conference , a repeat CT scan was done yesterday and was reviewed at the [facility] conference this morning.  I discussed with the patient having left upper lobectomy in the past but he wanted to wait until a follow-up scan was done.    CT scan of the chest demonstrating multiple pulmonary nodules and groundglass opacities.  The patient is followed by Dr. Marlou Porch for known coronary artery disease.  In February 2018 he had coronary stent placed.  He was maintained on Plavix for a year but since has discontinued it.  He has  seen by Dr. Marlou Porch in the office for vague left chest discomfort.  A stress test was performed  Study Highlights    Nuclear stress EF: 55%.  Blood pressure demonstrated a normal response to exercise.  There was no ST segment deviation noted during stress.  Defect 1: There is a small defect of mild severity present in the apical inferior and apical lateral location.  Findings consistent with mild ischemia.  The left ventricular ejection fraction is normal (55-65%).       In addition to the stress test chest x-ray showed a right lung nodule, this led to a CT scan of the chest which shows multiple pulmonary nodules and groundglass opacities.   The patient is  a very limited smoker having smoked for less than 2 years between [place]  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  He he denies any environmental exposures, not aware of any asbestos exposure.  He does do some woodworking with wood dust exposure.  Patient denies any shortness of breath these had no hemoptysis no fever chills no cough.  Patient has had no chest x-rays or CT scans of the chest in the last 10 years that he is aware of.  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 - MARGINS NOT INVOLVED. - TUMOR CONFINED WITHIN THYROID CAPSULE.      Current Activity/ Functional Status:  Patient is independent with mobility/ambulation, transfers, ADL's, IADL's.   Zubrod Score: At the time of surgery this patients most appropriate activity status/level should be described as: [x]     0    Normal activity, no symptoms []     1    Restricted in physical strenuous activity but ambulatory, able to do out light work []     2    Ambulatory and capable  of self care, unable to do work activities, up and about               >50 % of waking hours                              []     3    Only limited self care, in bed greater than 50% of waking hours []     4    Completely disabled, no self care, confined to bed or chair []     5    Moribund   Past Medical History:  Diagnosis Date   Arthritis    Colon polyps    Coronary artery disease involving native coronary artery of native heart without angina pectoris 04/23/2016   Myoview 02/08/16: EF 51, inf-lat, apical lateral scar with peri-infarct ischemia; intermediate risk  // LHC 2/18: dLAD 20, oD1 20, pLCx 80, pRCA 80, mRCA 20, dRCA 30, AM 90 >> PCI: 4 x 24 mm Synergy DES to pRCA    Family history of adverse reaction to anesthesia    Mom vomits and Dad went "nuts"   Fatigue     GERD (gastroesophageal reflux disease)    Hemorrhoids    History of echocardiogram    Echo 02/08/16: Mild LVH, EF 55-60, no RWMA, Gr 1 DD, trivial AI, mild LAE   Hyperlipidemia    Hypertension    Pre-diabetes    Pulmonary nodules     Past Surgical History:  Procedure Laterality Date   CARDIAC CATHETERIZATION     COLONOSCOPY WITH ESOPHAGOGASTRODUODENOSCOPY (EGD)  2008   CORONARY STENT INTERVENTION N/A 02/20/2016   Procedure: Coronary Stent Intervention;  Surgeon: Burnell Blanks, MD;  Location: [facility];  Service: Cardiovascular;  Laterality: N/A;   LEFT HEART CATH AND CORONARY ANGIOGRAPHY N/A 02/20/2016   Procedure: Left Heart Cath and Coronary Angiography;  Surgeon: Burnell Blanks, MD;  Location: [facility];  Service: Cardiovascular;  Laterality: N/A;   PILONIDAL CYCT     REFRACTIVE SURGERY     THYROIDECTOMY N/A 01/17/2018   Procedure: TOTAL THYROIDECTOMY;  Surgeon: Armandina Gemma, MD;  Location: [facility];  Service: General;  Laterality: N/A;   VIDEO BRONCHOSCOPY WITH ENDOBRONCHIAL NAVIGATION N/A 11/18/2017   Procedure: VIDEO BRONCHOSCOPY WITH ENDOBRONCHIAL NAVIGATION WITH BIOPSIES OF LEFT AND RIGHT UPPER LOBES;  Surgeon: Grace Isaac, MD;  Location: [facility];  Service: Thoracic;  Laterality: N/A;    Family History  Problem Relation Age of Onset   Heart attack Father 36       BYPASS   Crohn's disease Sister    Colon cancer Paternal Uncle    Healthy Sister    Patient's daughter had a history of thyroid cancer status post thyroidectomy  Social History   Tobacco Use  Smoking Status Former Smoker   Types: Cigarettes   Last attempt to quit: 02/01/1967   Years since quitting: 51.3  Smokeless Tobacco Never Used    Social History   Substance and Sexual Activity  Alcohol Use Yes   Comment: a drink before dinner a few times a week     Allergies  Allergen Reactions   Gemfibrozil Other (See Comments)    Muscle pain    Niaspan [Niacin Er] Rash and Other (See Comments)    Flushing - aspirin did not mitigate    Simvastatin Other (See Comments)    Drug-Drug interaction with amlodipine    Current  Outpatient Medications  Medication Sig Dispense Refill   acetaminophen (TYLENOL) 325 MG tablet Take 250 mg by mouth every 6 (six) hours as needed for moderate pain or headache.      amLODipine (NORVASC) 10 MG tablet Take 10 mg by mouth daily.     aspirin EC 81 MG tablet Take 81 mg by mouth every evening.      ibuprofen (ADVIL,MOTRIN) 200 MG tablet Take 200 mg by mouth every 6 (six) hours as needed for headache or moderate pain.      isosorbide mononitrate (IMDUR) 60 MG 24 hr tablet Take 1 tablet (60 mg total) by mouth daily. 90 tablet 3   levothyroxine (SYNTHROID) 137 MCG tablet Take 137 mcg by mouth daily.     losartan (COZAAR) 100 MG tablet Take 100 mg by mouth daily.     nitroGLYCERIN (NITROSTAT) 0.4 MG SL tablet Place 1 tablet (0.4 mg total) under the tongue every 5 (five) minutes as needed for chest pain. 25 tablet 12   omega-3 acid ethyl esters (LOVAZA) 1 g capsule Take 2 g by mouth 2 (two) times daily.     ondansetron (ZOFRAN) 4 MG tablet Take 1 tablet (4 mg total) by mouth every 6 (six) hours as needed for nausea or vomiting. 20 tablet 0   pantoprazole (PROTONIX) 40 MG tablet TAKE 1 TABLET(40 MG) BY MOUTH DAILY 90 tablet 3   propranolol (INDERAL) 20 MG tablet Take 20 mg by mouth daily.  2   rosuvastatin (CRESTOR) 20 MG tablet TAKE 1 TABLET(20 MG) BY MOUTH DAILY 90 tablet 3   traMADol (ULTRAM) 50 MG tablet Take 1-2 tablets (50-100 mg total) by mouth every 6 (six) hours as needed. 15 tablet 0   traZODone (DESYREL) 100 MG tablet Take 100 mg by mouth at bedtime as needed for sleep.     zolpidem (AMBIEN) 10 MG tablet Take 10 mg by mouth at bedtime as needed for sleep.   0   No current facility-administered medications for this visit.     Pertinent items are noted in HPI.     PHYSICAL  EXAMINATION: BP 126/82 (BP Location: Left Arm, Patient Position: Sitting, Cuff Size: Normal)    Pulse 67    Temp 98.7 F (37.1 C) (Oral)    Resp 16    Ht 5\' 11"  (1.803 m)    Wt 172 lb (78 kg)    SpO2 97% Comment: ON RA   BMI 23.99 kg/m  General appearance: alert and cooperative Head: Normocephalic, without obvious abnormality, atraumatic Neck: no adenopathy, no carotid bruit, no JVD, supple, symmetrical, trachea midline and thyroid not enlarged, symmetric, no tenderness/mass/nodules Lymph nodes: Cervical, supraclavicular, and axillary nodes normal. Resp: clear to auscultation bilaterally Back: symmetric, no curvature. ROM normal. No CVA tenderness. Cardio: regular rate and rhythm, S1, S2 normal, no murmur, click, rub or gallop GI: soft, non-tender; bowel sounds normal; no masses,  no organomegaly Extremities: extremities normal, atraumatic, no cyanosis or edema and Homans sign is negative, no sign of DVT Neurologic: Grossly normal   Diagnostic Studies & Laboratory data:     Recent Radiology Findings:  Ct Chest Wo Contrast  Result Date: 05/28/2018 CLINICAL DATA:  Left chest pain.  Follow up lung nodule. EXAM: CT CHEST WITHOUT CONTRAST TECHNIQUE: Multidetector CT imaging of the chest was performed following the standard protocol without IV contrast. COMPARISON:  Multiple exams, including 02/26/2018 FINDINGS: Cardiovascular: Atherosclerotic calcification of the aortic arch, left anterior descending, right left circumflex coronary arteries. Mediastinum/Nodes: Prior thyroidectomy. No  pathologic adenopathy observed. Lungs/Pleura: Biapical pleuroparenchymal scarring. Right upper lobe ground-glass density pulmonary nodule 3.0 cm in long axis, previously the same on oldest available comparison of 10/23/2017 by my measurements. Peribronchovascular nodules in the left upper lobe are present with a solid nodule measuring 1.3 by 0.9 cm on image 42/3 (formerly the same on 10/23/2017) and ground-glass density  components including a 1.1 cm component on image 31/3 and a 1.3 by 0.5 cm component on image 32/3, these likewise appear stable from the earliest available comparison of 10/23/2017. A 5 mm sub solid nodule in the left upper lobe on image 53/3 is stable. Lingular scarring, stable. Back on 10/23/2017 there was a 10 mm solid-appearing right lower lobe pulmonary nodule; in this vicinity on today's exam there is only some faint scarring on image 111/3, that nodule is otherwise resolved. Upper Abdomen: The thoracoabdominal junction, a right paraspinal soft tissue density measuring 2.4 by 1.7 cm has some faint calcification along its anterior margin. This is immediately adjacent to the right side of the L1 vertebral body and may slightly scallop the vertebral body margin although there is intact cortex. This lesion measured the same on 10/23/2017, and only head low-grade metabolic activity on prior PET-CT. Abdominal aortic atherosclerotic calcification is present. Musculoskeletal: Unremarkable IMPRESSION: 1. Bilateral upper lobe ground-glass density pulmonary nodules, with a peribronchovascular solid nodular component in the left upper lobe. These nodules are stable compared to the earliest available comparison of 10/23/2017. Low-grade adenocarcinoma not excluded for the ground-glass density nodules, and the solid component of the left upper lobe nodule is nonspecific but bronchial carcinoid tumor not excluded. We have not demonstrated 7 months of stability. Continued surveillance is recommended. 2. In the right paraspinal space at L1 there is 2.4 by 1.7 cm soft tissue density oval-shaped nodule with faint anterior calcification which has low-grade activity but not overtly hypermetabolic activity at PET-CT. Stable since earliest available comparison of 10/23/2017. This is probably a benign lesion such as schwannoma or neurofibroma, but surveillance of this lesion is also suggested. 3.  Aortic Atherosclerosis (ICD10-I70.0).   Coronary atherosclerosis. 4. Prior thyroidectomy. Electronically Signed   By: Van Clines M.D.   On: 05/28/2018 16:02    I have independently reviewed the above radiology studies  and reviewed the findings with the patient.     Dg Chest 2 View  Result Date: 10/14/2017 CLINICAL DATA:  LEFT axillary chest pain for 4-6 weeks, no known injury, history hypertension, former smoker, coronary artery disease post stenting EXAM: CHEST - 2 VIEW COMPARISON:  None FINDINGS: Normal heart size, mediastinal contours, and pulmonary vascularity. Coronary stent noted. Atherosclerotic calcification aorta. Minimal subsegmental atelectasis LEFT base. 10 mm nodular density lower lateral RIGHT lung, doubt nipple shadow from inferior location. Remaining lungs clear. No infiltrate, pleural effusion or pneumothorax. No acute osseous findings. IMPRESSION: Minimal LEFT basilar atelectasis with a 10 mm nonspecific nodular density at the lateral RIGHT lung base; pulmonary nodule not excluded and further assessment by CT chest recommended to exclude pulmonary nodule/tumor. These results will be called to the ordering clinician or representative by the Radiologist Assistant, and communication documented in the PACS or zVision Dashboard. Electronically Signed   By: Lavonia Dana M.D.   On: 10/14/2017 14:22   Ct Chest Wo Contrast  Result Date: 10/24/2017 CLINICAL DATA:  Evaluate pulmonary nodule. Abnormal chest radiograph. EXAM: CT CHEST WITHOUT CONTRAST TECHNIQUE: Multidetector CT imaging of the chest was performed following the standard protocol without IV contrast. COMPARISON:  None FINDINGS: Cardiovascular: The  heart size appears mildly enlarged. Aortic atherosclerosis. Calcification in the LAD left circumflex and RCA coronary artery noted. Mediastinum/Nodes: No enlarged mediastinal or axillary lymph nodes. The trachea appears patent and is midline. Normal appearance of the esophagus. Right lobe of thyroid gland nodule  measures 1.7 cm, image 18/2. Lungs/Pleura: Bilateral mixed attenuation pulmonary nodules identified. Within the right upper lobe there is a pure ground-glass attenuating nodule which measures 2.7 cm, image 38/3. Solid-appearing nodule within the right lower lobe measures 1 cm, image 110/3. Ground-glass attenuating nodule within the left upper lobe measures 5 mm, image 55/3. Within the left upper lobe there is a mixed ground-glass and solid attenuating nodule. This measures 2 cm with a central solid component of 1.4 cm, image 61/5. Several adjacent areas of ground-glass nodularity are identified including a 1 cm left apical nodule, image 33/3. Upper Abdomen: No acute abnormality. Musculoskeletal: No chest wall mass or suspicious bone lesions identified. IMPRESSION: 1. Bilateral ground-glass, solid, and part solid pulmonary nodules are identified. Within the left upper lobe there is a part solid nodule measuring 2 cm with a 1.4 cm solid component. In the right lower lobe there is a solid subpleural nodule measuring 1 cm. 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 Thoracic surgery consultation is recommended. 2.  Aortic Atherosclerosis (ICD10-I70.0). 3. Multi vessel coronary artery atherosclerotic calcification 4. Thyroid nodule measuring 1.7 cm noted. Consider further evaluation with thyroid ultrasound. If patient is clinically hyperthyroid, consider nuclear medicine thyroid uptake and scan. Electronically Signed   By: Kerby Moors M.D.   On: 10/24/2017 11:31    Recent Lab Findings: Lab Results  Component Value Date   WBC 8.3 01/24/2018   HGB 16.6 01/24/2018   HCT 51.2 01/24/2018   PLT 193 01/24/2018   GLUCOSE 128 (H) 01/24/2018   CHOL 151 01/21/2017   TRIG 177 (H) 01/21/2017   HDL 48 01/21/2017   LDLCALC 68 01/21/2017   ALT 23 01/24/2018   AST 23 01/24/2018   NA 139 01/24/2018   K 3.4 (L) 01/24/2018   CL 103 01/24/2018   CREATININE 0.75 01/24/2018   BUN 20 01/24/2018   CO2 26  01/24/2018   TSH 10.736 (H) 01/24/2018   INR 1.08 11/18/2017   HGBA1C 5.6 01/13/2018   Cath February 2018, most recent  Acute Mrg lesion, 90 %stenosed.  Mid RCA lesion, 20 %stenosed.  Dist RCA lesion, 30 %stenosed.  Prox Cx to Mid Cx lesion, 20 %stenosed.  1st Diag lesion, 20 %stenosed.  Dist LAD lesion, 20 %stenosed.  A STENT SYNERGY DES [id number] drug eluting stent was successfully placed.  Prox RCA to Mid RCA lesion, 80 %stenosed.  Post intervention, there is a 0% residual stenosis.   1. Severe single vessel CAD (severe stenosis mid RCA) 2. Successful PTCA/DES x 1 mid RCA  3. Mild non-obstructive disease Circumflex and LAD  Recommendations: Will continue DAPT with ASA and Plavix for one year. If he needs to interrupt anti-platelet therapy for a toe surgery, this could be considered at 3 months since a Synergy DES was placed, although it would be optimal to continue for at least 6 months. Will discharge home today as part of the same day PCI protocol. Will changed Prilosec to Protonix 40 mg daily. He will be started on Plavix 75 mg daily. Follow up with Dr. Marlou Porch or office APP in 1 week.   PFT's 3.16 93% 33.61 99% The FVC, FEV1, FEV1/FVC ratio and FEF25-75% are within normal limits, but there is curvature to the flow  volume loop suggesting minimal small airway disease. The airway resistance is normal. Lung volumes are within normal limits. Following administration of bronchodilators, there is no significant response. The diffusing capacity is normal. However, the diffusing capacity was not corrected for the patient's hemoglobin. Conclusions: Minimal airway obstruction is present suggesting small airway disease. 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 I have again discussed with the patient proceeding with resection of left upper lesion, he did not wish to immediately proceed with lung resection but was willing to repeat a navigation bronchoscopy with specific attention to the left upper lobe lesion.  #2 history of coronary artery disease status post stenting  #3 1.7 cm right thyroid nodule- papillary ca of thyroid after total thyroidectomy    Grace Isaac MD      [address] [place] [address] Office [phone]   Beeper [phone]  05/29/2018 11:57 AM

## 2018-06-06 NOTE — Progress Notes (Signed)
[id number] [address], [address] AT [facility] OF [address] [place] [address] Phone: [phone] Fax: [phone]      Your procedure is scheduled on    Report to [facility] Main Entrance "A" at 1100 A.M., and check in at the Admitting office.  Call this number if you have problems the morning of surgery:  [phone]  Call [phone] if you have any questions prior to your surgery date Monday-Friday 8am-4pm    Remember:  Do not eat or drink after midnight.     Take these medicines the morning of surgery with A SIP OF WATER  acetaminophen (TYLENOL)  amLODipine (NORVASC)  isosorbide mononitrate (IMDUR levothyroxine (SYNTHROID)  pantoprazole (PROTONIX)  propranolol (INDERAL) rosuvastatin (CRESTOR)  7 days prior to surgery STOP taking any Aspirin (unless otherwise instructed by your surgeon), Aleve, Naproxen, Ibuprofen, Motrin, Advil, Goody's, BC's, all herbal medications, fish oil, and all vitamins.  Follow your surgeon's instructions on when to stop Aspirin.  If no instructions were given by your surgeon then you will need to call the office to get those instructions.      The Morning of Surgery  Do not wear jewelry, make-up or nail polish.  Do not wear lotions, powders, or perfumes/colognes, or deodorant  Do not shave 48 hours prior to surgery.  Men may shave face and neck.  Do not bring valuables to the hospital.  [facility] is not responsible for any belongings or valuables.  If you are a smoker, DO NOT Smoke 24 hours prior to surgery IF you wear a CPAP at night please bring your mask, tubing, and machine the morning of surgery   Remember that you must have someone to transport you home after your surgery, and remain with you for 24 hours if you are discharged the same day.   Contacts, glasses, hearing aids, dentures or bridgework may not be worn into surgery.    Leave your suitcase in the car.   After surgery it may be brought to your room.  For patients admitted to the hospital, discharge time will be determined by your treatment team.  Patients discharged the day of surgery will not be allowed to drive home.    Special instructions:   [facility]- Preparing For Surgery  Before surgery, you can play an important role. Because skin is not sterile, your skin needs to be as free of germs as possible. You can reduce the number of germs on your skin by washing with CHG (chlorahexidine gluconate) Soap before surgery.  CHG is an antiseptic cleaner which kills germs and bonds with the skin to continue killing germs even after washing.    Oral Hygiene is also important to reduce your risk of infection.  Remember - BRUSH YOUR TEETH THE MORNING OF SURGERY WITH YOUR REGULAR TOOTHPASTE  Please do not use if you have an allergy to CHG or antibacterial soaps. If your skin becomes reddened/irritated stop using the CHG.  Do not shave (including legs and underarms) for at least 48 hours prior to first CHG shower. It is OK to shave your face.  Please follow these instructions carefully.   1. Shower the NIGHT BEFORE SURGERY and the MORNING OF SURGERY with CHG Soap.   2. If you chose to wash your hair, wash your hair first as usual with your normal shampoo.  3. After you shampoo, rinse your hair and body thoroughly to remove the shampoo.  4. Use CHG as you would any other liquid soap. You can apply CHG directly to the skin and wash gently with a scrungie or a clean washcloth.   5. Apply the CHG Soap to your body ONLY FROM THE NECK DOWN.  Do not use on open wounds or open sores. Avoid contact with your eyes, ears, mouth and genitals (private parts). Wash Face and genitals (private parts)  with your normal soap.   6. Wash thoroughly, paying special attention to the area where your surgery will be performed.  7. Thoroughly rinse your body with warm water from the neck down.  8. DO NOT shower/wash  with your normal soap after using and rinsing off the CHG Soap.  9. Pat yourself dry with a CLEAN TOWEL.  10. Wear CLEAN PAJAMAS to bed the night before surgery, wear comfortable clothes the morning of surgery  11. Place CLEAN SHEETS on your bed the night of your first shower and DO NOT SLEEP WITH PETS.    Day of Surgery:  Do not apply any deodorants/lotions.  Please wear clean clothes to the hospital/surgery center.   Remember to brush your teeth WITH YOUR REGULAR TOOTHPASTE.   Please read over the following fact sheets that you were given.

## 2018-06-09 ENCOUNTER — Encounter (HOSPITAL_COMMUNITY)
Admission: RE | Admit: 2018-06-09 | Discharge: 2018-06-09 | Disposition: A | Discharge status: Home / Self Care | Payer: Medicare Other | Source: Ambulatory Visit | Attending: Cardiothoracic Surgery | Admitting: Cardiothoracic Surgery

## 2018-06-09 ENCOUNTER — Other Ambulatory Visit (HOSPITAL_COMMUNITY)
Admission: RE | Admit: 2018-06-09 | Discharge: 2018-06-09 | Disposition: A | Discharge status: Home / Self Care | Payer: Medicare Other | Source: Ambulatory Visit | Attending: Cardiothoracic Surgery | Admitting: Cardiothoracic Surgery

## 2018-06-09 ENCOUNTER — Ambulatory Visit (HOSPITAL_COMMUNITY)
Admission: RE | Admit: 2018-06-09 | Discharge: 2018-06-09 | Disposition: A | Discharge status: Home / Self Care | Payer: Medicare Other | Source: Ambulatory Visit | Attending: Cardiothoracic Surgery | Admitting: Cardiothoracic Surgery

## 2018-06-09 ENCOUNTER — Other Ambulatory Visit: Payer: Self-pay

## 2018-06-09 ENCOUNTER — Encounter (HOSPITAL_COMMUNITY): Payer: Self-pay

## 2018-06-09 DIAGNOSIS — Z955 Presence of coronary angioplasty implant and graft: Secondary | ICD-10-CM | POA: Insufficient documentation

## 2018-06-09 DIAGNOSIS — Z1159 Encounter for screening for other viral diseases: Secondary | ICD-10-CM | POA: Diagnosis not present

## 2018-06-09 DIAGNOSIS — I2510 Atherosclerotic heart disease of native coronary artery without angina pectoris: Secondary | ICD-10-CM | POA: Insufficient documentation

## 2018-06-09 DIAGNOSIS — Z79899 Other long term (current) drug therapy: Secondary | ICD-10-CM | POA: Diagnosis not present

## 2018-06-09 DIAGNOSIS — Z79890 Hormone replacement therapy (postmenopausal): Secondary | ICD-10-CM | POA: Diagnosis not present

## 2018-06-09 DIAGNOSIS — R911 Solitary pulmonary nodule: Secondary | ICD-10-CM

## 2018-06-09 DIAGNOSIS — C801 Malignant (primary) neoplasm, unspecified: Secondary | ICD-10-CM

## 2018-06-09 DIAGNOSIS — R7303 Prediabetes: Secondary | ICD-10-CM | POA: Diagnosis not present

## 2018-06-09 DIAGNOSIS — I10 Essential (primary) hypertension: Secondary | ICD-10-CM | POA: Diagnosis not present

## 2018-06-09 DIAGNOSIS — Z7982 Long term (current) use of aspirin: Secondary | ICD-10-CM | POA: Insufficient documentation

## 2018-06-09 DIAGNOSIS — K219 Gastro-esophageal reflux disease without esophagitis: Secondary | ICD-10-CM | POA: Diagnosis not present

## 2018-06-09 DIAGNOSIS — E785 Hyperlipidemia, unspecified: Secondary | ICD-10-CM | POA: Insufficient documentation

## 2018-06-09 DIAGNOSIS — E890 Postprocedural hypothyroidism: Secondary | ICD-10-CM | POA: Diagnosis not present

## 2018-06-09 DIAGNOSIS — Z87891 Personal history of nicotine dependence: Secondary | ICD-10-CM | POA: Insufficient documentation

## 2018-06-09 HISTORY — DX: Other specified postprocedural states: R11.2

## 2018-06-09 HISTORY — DX: Malignant (primary) neoplasm, unspecified: C80.1

## 2018-06-09 HISTORY — DX: Other specified postprocedural states: Z98.890

## 2018-06-09 LAB — CBC
HCT: 46.5 % (ref 39.0–52.0)
Hemoglobin: 15.3 g/dL (ref 13.0–17.0)
MCH: 30.2 pg (ref 26.0–34.0)
MCHC: 32.9 g/dL (ref 30.0–36.0)
MCV: 91.9 fL (ref 80.0–100.0)
Platelets: 174 10*3/uL (ref 150–400)
RBC: 5.06 MIL/uL (ref 4.22–5.81)
RDW: 12.2 % (ref 11.5–15.5)
WBC: 5.6 10*3/uL (ref 4.0–10.5)
nRBC: 0 % (ref 0.0–0.2)

## 2018-06-09 LAB — COMPREHENSIVE METABOLIC PANEL
ALT: 16 U/L (ref 0–44)
AST: 25 U/L (ref 15–41)
Albumin: 4.4 g/dL (ref 3.5–5.0)
Alkaline Phosphatase: 71 U/L (ref 38–126)
Anion gap: 9 (ref 5–15)
BUN: 12 mg/dL (ref 8–23)
CO2: 29 mmol/L (ref 22–32)
Calcium: 9.2 mg/dL (ref 8.9–10.3)
Chloride: 102 mmol/L (ref 98–111)
Creatinine, Ser: 0.84 mg/dL (ref 0.61–1.24)
GFR calc Af Amer: >60 mL/min (ref >60)
GFR calc non Af Amer: >60 mL/min (ref >60)
Glucose, Bld: 119 mg/dL — ABNORMAL HIGH (ref 70–99)
Potassium: 3.7 mmol/L (ref 3.5–5.1)
Sodium: 140 mmol/L (ref 135–145)
Total Bilirubin: 0.9 mg/dL (ref 0.3–1.2)
Total Protein: 6.7 g/dL (ref 6.5–8.1)

## 2018-06-09 LAB — PROTIME-INR
INR: 1 (ref 0.8–1.2)
Prothrombin Time: 13.4 seconds (ref 11.4–15.2)

## 2018-06-09 LAB — GLUCOSE, CAPILLARY: Glucose-Capillary: 124 mg/dL — ABNORMAL HIGH (ref 70–99)

## 2018-06-09 LAB — HEMOGLOBIN A1C
Hgb A1c MFr Bld: 5.9 % — ABNORMAL HIGH (ref 4.8–5.6)
Mean Plasma Glucose: 122.63 mg/dL

## 2018-06-09 LAB — APTT: aPTT: 30 seconds (ref 24–36)

## 2018-06-09 NOTE — Progress Notes (Addendum)
PCP - [name] Cardiologist - [place]  Chest x-ray - 06/09/18 EKG - 01/24/18 Stress Test - 10/02/17 ECHO - 02/08/16 Cardiac Cath - 02/20/16  Patient is pre-diabetic but doesn't check sugars   Aspirin Instructions: continue aspirin but do not take the day of surgery  Anesthesia review: yes, hx CAD  Patient denies shortness of breath, fever, cough and chest pain at PAT appointment   Patient verbalized understanding of instructions that were given to them at the PAT appointment. Patient was also instructed that they will need to review over the PAT instructions again at home before surgery.

## 2018-06-10 NOTE — Progress Notes (Signed)
Anesthesia Chart Review:  Case:  [id number] Date/Time:  06/11/18 1256   Procedure:  VIDEO BRONCHOSCOPY WITH ENDOBRONCHIAL NAVIGATION (N/A )   Anesthesia type:  General   Pre-op diagnosis:  LUL LUNG LESION   Location:  [facility] OR ROOM 76 / [place] [facility]   Surgeon:  Grace Isaac, MD      DISCUSSION: 72 yo former smoker with h/o HTN, CAD, GERD (controlled with medication), HLD, pre-diabetic (A1C 5.9 06/09/2018), s/p recent total thyroidectomy 01/17/18.  Pt had DES placed mid RCA 02/2016, maintained on Plavix for 1 year after.  Followed by Dr. Marlou Porch.  He was seen several months ago for vague chest discomfort, stress test performed 10/02/17 which was a low risk test with minimal defect. He was advised to continue with medical management. Underwent uncomplicated total thyroidectomy 01/17/18. Last seen by Dr. Marlou Porch 04/29/18. Per OV note "Overall he has been doing quite well.  His thyroid medication has been adjusted by Dr. Buddy Duty.  He still has the occasional upper chest discomfort which he wonders if this could be secondary to his lung nodules."  Anticipate he can proceed as planned barring acute status change.  VS: BP 138/77   Pulse 65   Temp 36.8 C (Oral)   Resp 18   Ht 5\' 11"  (1.803 m)   Wt 80.2 kg   SpO2 99%   BMI 24.64 kg/m   PROVIDERS: [name], MD is PCP  Candee Furbish, MD is Cardiologist  LABS: Labs reviewed: Acceptable for surgery. (all labs ordered are listed, but only abnormal results are displayed)  Labs Reviewed  GLUCOSE, CAPILLARY - Abnormal; Notable for the following components:      Result Value   Glucose-Capillary 124 (*)    All other components within normal limits  COMPREHENSIVE METABOLIC PANEL - Abnormal; Notable for the following components:   Glucose, Bld 119 (*)    All other components within normal limits  HEMOGLOBIN A1C - Abnormal; Notable for the following components:   Hgb A1c MFr Bld 5.9 (*)    All other components within normal limits  APTT  CBC  PROTIME-INR      IMAGES: CHEST - 2 VIEW 06/09/2018:  COMPARISON:  05/28/2018  FINDINGS: Similar hyperinflation. Left upper lobe nodule measuring up to 1.3 cm by CT is difficult to visualize by plain radiography and likely superimposed over the left first anterior rib shadow.  No superimposed pneumonia, collapse or consolidation. Negative for edema, effusion or pneumothorax. Trachea midline. Normal heart size and vascularity. Aorta atherosclerotic. Degenerative changes of the spine.  IMPRESSION: 1.3 cm left upper lobe nodule better demonstrated by CT.  Stable hyperinflation  No superimposed acute process  EKG: 01/24/2018: Sinus rhythm. Rate 71. Left anterior fascicular block. Abnormal R-wave progression, early transition. Left ventricular hypertroph. similar pattern to prior 2/18  CV: Stress Test 10/02/17  Nuclear stress EF: 55%.  Blood pressure demonstrated a normal response to exercise.  There was no ST segment deviation noted during stress.  Defect 1: There is a small defect of mild severity present in the apical inferior and apical lateral location.  Findings consistent with mild ischemia.  The left ventricular ejection fraction is normal (55-65%).  Cardiac Cath 02/20/16 1. Severe single vessel CAD (severe stenosis mid RCA) 2. Successful PTCA/DES x 1 mid RCA  3. Mild non-obstructive disease Circumflex and LAD  Echo 02/08/16 Study Conclusions  - Left ventricle: The cavity size was normal. Wall thickness was increased in a pattern of mild LVH. Systolic function was normal.  The estimated ejection fraction was in the range of 55% to 60%. Wall motion was normal; there were no regional wall motion abnormalities. Doppler parameters are consistent with abnormal left ventricular relaxation (grade 1 diastolic dysfunction). - Aortic valve: There was trivial regurgitation. - Left atrium: The atrium was mildly dilated. - Atrial septum: No defect or patent foramen  ovale was identified.   Past Medical History:  Diagnosis Date  . Arthritis   . Colon polyps   . Coronary artery disease involving native coronary artery of native heart without angina pectoris 04/23/2016   Myoview 02/08/16: EF 51, inf-lat, apical lateral scar with peri-infarct ischemia; intermediate risk  // LHC 2/18: dLAD 20, oD1 20, pLCx 20, pRCA 80, mRCA 20, dRCA 30, AM 90 >> PCI: 4 x 24 mm Synergy DES to pRCA   . Family history of adverse reaction to anesthesia    Mom vomits and Dad went "nuts"  . Fatigue   . GERD (gastroesophageal reflux disease)   . Hemorrhoids   . History of echocardiogram    Echo 02/08/16: Mild LVH, EF 55-60, no RWMA, Gr 1 DD, trivial AI, mild LAE  . Hyperlipidemia   . Hypertension   . PONV (postoperative nausea and vomiting)   . Pre-diabetes   . Pulmonary nodules     Past Surgical History:  Procedure Laterality Date  . CARDIAC CATHETERIZATION    . COLONOSCOPY WITH ESOPHAGOGASTRODUODENOSCOPY (EGD)  2008  . CORONARY STENT INTERVENTION N/A 02/20/2016   Procedure: Coronary Stent Intervention;  Surgeon: Burnell Blanks, MD;  Location: [facility];  Service: Cardiovascular;  Laterality: N/A;  . ESOPHAGOGASTRODUODENOSCOPY    . LEFT HEART CATH AND CORONARY ANGIOGRAPHY N/A 02/20/2016   Procedure: Left Heart Cath and Coronary Angiography;  Surgeon: Burnell Blanks, MD;  Location: [facility];  Service: Cardiovascular;  Laterality: N/A;  . PILONIDAL CYCT    . REFRACTIVE SURGERY     retina tear repair  . THYROIDECTOMY N/A 01/17/2018   Procedure: TOTAL THYROIDECTOMY;  Surgeon: Armandina Gemma, MD;  Location: [facility];  Service: General;  Laterality: N/A;  . VIDEO BRONCHOSCOPY WITH ENDOBRONCHIAL NAVIGATION N/A 11/18/2017   Procedure: VIDEO BRONCHOSCOPY WITH ENDOBRONCHIAL NAVIGATION WITH BIOPSIES OF LEFT AND RIGHT UPPER LOBES;  Surgeon: Grace Isaac, MD;  Location: [facility];  Service: Thoracic;  Laterality: N/A;    MEDICATIONS: . acetaminophen  (TYLENOL) 325 MG tablet  . amLODipine (NORVASC) 10 MG tablet  . aspirin EC 81 MG tablet  . ibuprofen (ADVIL,MOTRIN) 200 MG tablet  . isosorbide mononitrate (IMDUR) 60 MG 24 hr tablet  . levothyroxine (SYNTHROID) 137 MCG tablet  . losartan (COZAAR) 100 MG tablet  . nitroGLYCERIN (NITROSTAT) 0.4 MG SL tablet  . omega-3 acid ethyl esters (LOVAZA) 1 g capsule  . ondansetron (ZOFRAN) 4 MG tablet  . pantoprazole (PROTONIX) 40 MG tablet  . propranolol (INDERAL) 20 MG tablet  . rosuvastatin (CRESTOR) 20 MG tablet  . traMADol (ULTRAM) 50 MG tablet  . traZODone (DESYREL) 100 MG tablet  . zolpidem (AMBIEN) 10 MG tablet   No current facility-administered medications for this encounter.    Wynonia Musty [facility]/Anesthesiology Phone [phone] 06/10/2018 10:28 AM

## 2018-06-10 NOTE — Anesthesia Preprocedure Evaluation (Addendum)
Anesthesia Evaluation   Patient awake    Reviewed: Allergy & Precautions, NPO status , Patient's Chart, lab work & pertinent test results  History of Anesthesia Complications (+) PONV and history of anesthetic complications  Airway Mallampati: II  TM Distance: >3 FB Neck ROM: Full    Dental no notable dental hx. (+) Teeth Intact   Pulmonary former smoker,    Pulmonary exam normal breath sounds clear to auscultation       Cardiovascular hypertension, Pt. on medications + CAD and + Cardiac Stents  Normal cardiovascular exam Rhythm:Regular Rate:Normal  10/02/17 Myoview  Nuclear stress EF: 55%.  Blood pressure demonstrated a normal response to exercise.  There was no ST segment deviation noted during stress.  Defect 1: There is a small defect of mild severity present in the apical inferior and apical lateral location.  Findings consistent with mild ischemia.   Neuro/Psych negative neurological ROS  negative psych ROS   GI/Hepatic GERD  ,  Endo/Other  Hypothyroidism   Renal/GU      Musculoskeletal  (+) Arthritis ,   Abdominal   Peds  Hematology   Anesthesia Other Findings   Reproductive/Obstetrics                           Anesthesia Physical Anesthesia Plan  ASA: III  Anesthesia Plan: General   Post-op Pain Management:    Induction: Intravenous  PONV Risk Score and Plan: 3 and Treatment may vary due to age or medical condition, Dexamethasone and Ondansetron  Airway Management Planned: Oral ETT  Additional Equipment:   Intra-op Plan:   Post-operative Plan: Extubation in OR  Informed Consent:     Dental advisory given  Plan Discussed with:   Anesthesia Plan Comments: (See PAT note by Karoline Caldwell, PA-C  [place]  w  large bore IVs )      Anesthesia Quick Evaluation

## 2018-06-11 ENCOUNTER — Ambulatory Visit (HOSPITAL_COMMUNITY): Payer: Medicare Other | Admitting: Physician Assistant

## 2018-06-11 ENCOUNTER — Ambulatory Visit (HOSPITAL_COMMUNITY): Payer: Medicare Other | Admitting: Anesthesiology

## 2018-06-11 ENCOUNTER — Other Ambulatory Visit: Payer: Self-pay

## 2018-06-11 ENCOUNTER — Encounter (HOSPITAL_COMMUNITY)
Admission: RE | Disposition: A | Discharge status: Home / Self Care | Payer: Self-pay | Source: Home / Self Care | Attending: Cardiothoracic Surgery

## 2018-06-11 ENCOUNTER — Ambulatory Visit (HOSPITAL_COMMUNITY): Payer: Medicare Other

## 2018-06-11 ENCOUNTER — Encounter (HOSPITAL_COMMUNITY): Payer: Self-pay

## 2018-06-11 ENCOUNTER — Ambulatory Visit (HOSPITAL_COMMUNITY)
Admission: RE | Admit: 2018-06-11 | Discharge: 2018-06-11 | Disposition: A | Discharge status: Home / Self Care | Payer: Medicare Other | Attending: Cardiothoracic Surgery | Admitting: Cardiothoracic Surgery

## 2018-06-11 DIAGNOSIS — I2510 Atherosclerotic heart disease of native coronary artery without angina pectoris: Secondary | ICD-10-CM | POA: Diagnosis not present

## 2018-06-11 DIAGNOSIS — K219 Gastro-esophageal reflux disease without esophagitis: Secondary | ICD-10-CM | POA: Diagnosis not present

## 2018-06-11 DIAGNOSIS — I10 Essential (primary) hypertension: Secondary | ICD-10-CM | POA: Diagnosis not present

## 2018-06-11 DIAGNOSIS — R918 Other nonspecific abnormal finding of lung field: Secondary | ICD-10-CM | POA: Insufficient documentation

## 2018-06-11 DIAGNOSIS — C73 Malignant neoplasm of thyroid gland: Secondary | ICD-10-CM | POA: Diagnosis not present

## 2018-06-11 DIAGNOSIS — C3412 Malignant neoplasm of upper lobe, left bronchus or lung: Secondary | ICD-10-CM | POA: Diagnosis not present

## 2018-06-11 DIAGNOSIS — Z419 Encounter for procedure for purposes other than remedying health state, unspecified: Secondary | ICD-10-CM

## 2018-06-11 DIAGNOSIS — R911 Solitary pulmonary nodule: Secondary | ICD-10-CM

## 2018-06-11 DIAGNOSIS — Z87891 Personal history of nicotine dependence: Secondary | ICD-10-CM | POA: Diagnosis not present

## 2018-06-11 DIAGNOSIS — R7303 Prediabetes: Secondary | ICD-10-CM | POA: Insufficient documentation

## 2018-06-11 DIAGNOSIS — Z951 Presence of aortocoronary bypass graft: Secondary | ICD-10-CM | POA: Insufficient documentation

## 2018-06-11 DIAGNOSIS — E785 Hyperlipidemia, unspecified: Secondary | ICD-10-CM | POA: Diagnosis not present

## 2018-06-11 DIAGNOSIS — M1991 Primary osteoarthritis, unspecified site: Secondary | ICD-10-CM | POA: Insufficient documentation

## 2018-06-11 DIAGNOSIS — D440 Neoplasm of uncertain behavior of thyroid gland: Secondary | ICD-10-CM | POA: Diagnosis not present

## 2018-06-11 HISTORY — PX: VIDEO BRONCHOSCOPY WITH ENDOBRONCHIAL NAVIGATION: SHX6175

## 2018-06-11 LAB — GLUCOSE, CAPILLARY
Glucose-Capillary: 126 mg/dL — ABNORMAL HIGH (ref 70–99)
Glucose-Capillary: 104 mg/dL — ABNORMAL HIGH (ref 70–99)

## 2018-06-11 LAB — NOVEL CORONAVIRUS, NAA (HOSP ORDER, SEND-OUT TO REF LAB; TAT 18-24 HRS): SARS-CoV-2, NAA: NOT DETECTED

## 2018-06-11 SURGERY — VIDEO BRONCHOSCOPY WITH ENDOBRONCHIAL NAVIGATION
Anesthesia: General

## 2018-06-11 MED ORDER — LACTATED RINGERS IV SOLN
INTRAVENOUS | Status: DC
  Administered 2018-06-11 (×2): via INTRAVENOUS

## 2018-06-11 MED ORDER — MIDAZOLAM HCL 2 MG/2ML IJ SOLN
INTRAMUSCULAR | Status: DC | PRN
  Administered 2018-06-11: 2 mg via INTRAVENOUS

## 2018-06-11 MED ORDER — SUGAMMADEX SODIUM 200 MG/2ML IV SOLN
INTRAVENOUS | Status: DC | PRN
  Administered 2018-06-11: 200 mg via INTRAVENOUS

## 2018-06-11 MED ORDER — PROPOFOL 10 MG/ML IV BOLUS
INTRAVENOUS | Status: DC | PRN
  Administered 2018-06-11: 130 mg via INTRAVENOUS

## 2018-06-11 MED ORDER — ACETAMINOPHEN 10 MG/ML IV SOLN: 1000.0000 mg | Freq: Once | INTRAVENOUS | Status: DC | PRN

## 2018-06-11 MED ORDER — FENTANYL CITRATE (PF) 250 MCG/5ML IJ SOLN
INTRAMUSCULAR | Status: AC
  Filled 2018-06-11: qty 5

## 2018-06-11 MED ORDER — ONDANSETRON HCL 4 MG/2ML IJ SOLN: 4.0000 mg | Freq: Once | INTRAMUSCULAR | Status: DC | PRN

## 2018-06-11 MED ORDER — DEXAMETHASONE SODIUM PHOSPHATE 10 MG/ML IJ SOLN
INTRAMUSCULAR | Status: AC
  Filled 2018-06-11: qty 1

## 2018-06-11 MED ORDER — LACTATED RINGERS IV SOLN
INTRAVENOUS | Status: DC
  Administered 2018-06-11: 12:00:00 via INTRAVENOUS

## 2018-06-11 MED ORDER — ONDANSETRON HCL 4 MG/2ML IJ SOLN
INTRAMUSCULAR | Status: DC | PRN
  Administered 2018-06-11: 4 mg via INTRAVENOUS

## 2018-06-11 MED ORDER — ROCURONIUM BROMIDE 10 MG/ML (PF) SYRINGE
PREFILLED_SYRINGE | INTRAVENOUS | Status: AC
  Filled 2018-06-11: qty 10

## 2018-06-11 MED ORDER — MIDAZOLAM HCL 2 MG/2ML IJ SOLN
INTRAMUSCULAR | Status: AC
  Filled 2018-06-11: qty 2

## 2018-06-11 MED ORDER — DEXAMETHASONE SODIUM PHOSPHATE 10 MG/ML IJ SOLN
INTRAMUSCULAR | Status: DC | PRN
  Administered 2018-06-11: 10 mg via INTRAVENOUS

## 2018-06-11 MED ORDER — LIDOCAINE 2% (20 MG/ML) 5 ML SYRINGE
INTRAMUSCULAR | Status: AC
  Filled 2018-06-11: qty 5

## 2018-06-11 MED ORDER — EPHEDRINE SULFATE 50 MG/ML IJ SOLN
INTRAMUSCULAR | Status: DC | PRN
  Administered 2018-06-11: 5 mg via INTRAVENOUS

## 2018-06-11 MED ORDER — PHENYLEPHRINE 40 MCG/ML (10ML) SYRINGE FOR IV PUSH (FOR BLOOD PRESSURE SUPPORT)
PREFILLED_SYRINGE | INTRAVENOUS | Status: AC
  Filled 2018-06-11: qty 10

## 2018-06-11 MED ORDER — SODIUM CHLORIDE 0.9 % IV SOLN
INTRAVENOUS | Status: DC | PRN
  Administered 2018-06-11: 25 ug/min via INTRAVENOUS

## 2018-06-11 MED ORDER — SUCCINYLCHOLINE CHLORIDE 200 MG/10ML IV SOSY
PREFILLED_SYRINGE | INTRAVENOUS | Status: AC
  Filled 2018-06-11: qty 10

## 2018-06-11 MED ORDER — ONDANSETRON HCL 4 MG/2ML IJ SOLN
INTRAMUSCULAR | Status: AC
  Filled 2018-06-11: qty 2

## 2018-06-11 MED ORDER — FENTANYL CITRATE (PF) 250 MCG/5ML IJ SOLN
INTRAMUSCULAR | Status: DC | PRN
  Administered 2018-06-11 (×2): 50 ug via INTRAVENOUS

## 2018-06-11 MED ORDER — FENTANYL CITRATE (PF) 100 MCG/2ML IJ SOLN: 25.0000 ug | INTRAMUSCULAR | Status: DC | PRN

## 2018-06-11 MED ORDER — LIDOCAINE 2% (20 MG/ML) 5 ML SYRINGE
INTRAMUSCULAR | Status: DC | PRN
  Administered 2018-06-11: 100 mg via INTRAVENOUS

## 2018-06-11 MED ORDER — 0.9 % SODIUM CHLORIDE (POUR BTL) OPTIME
TOPICAL | Status: DC | PRN
  Administered 2018-06-11: 12:00:00 1000 mL

## 2018-06-11 MED ORDER — PROPOFOL 10 MG/ML IV BOLUS
INTRAVENOUS | Status: AC
  Filled 2018-06-11: qty 20

## 2018-06-11 MED ORDER — ROCURONIUM BROMIDE 10 MG/ML (PF) SYRINGE
PREFILLED_SYRINGE | INTRAVENOUS | Status: DC | PRN
  Administered 2018-06-11: 50 mg via INTRAVENOUS
  Administered 2018-06-11: 20 mg via INTRAVENOUS
  Administered 2018-06-11: 10 mg via INTRAVENOUS

## 2018-06-11 MED ORDER — EPHEDRINE 5 MG/ML INJ
INTRAVENOUS | Status: AC
  Filled 2018-06-11: qty 10

## 2018-06-11 SURGICAL SUPPLY — 40 items
ADAPTER BRONCHOSCOPE OLYMPUS (ADAPTER) ×2 IMPLANT
ADAPTER VALVE BIOPSY EBUS (MISCELLANEOUS) IMPLANT
ADPR BSCP OLMPS EDG (ADAPTER) ×1
ADPTR VALVE BIOPSY EBUS (MISCELLANEOUS)
BRUSH BIOPSY BRONCH 10 SDTNB (MISCELLANEOUS) ×3 IMPLANT
BRUSH SUPERTRAX BIOPSY (INSTRUMENTS) IMPLANT
BRUSH SUPERTRAX NDL-TIP CYTO (INSTRUMENTS) ×3 IMPLANT
CANISTER SUCT 3000ML PPV (MISCELLANEOUS) ×2 IMPLANT
CHANNEL WORK EXTEND EDGE 180 (KITS) IMPLANT
CHANNEL WORK EXTEND EDGE 90 (KITS) IMPLANT
CONT SPEC 4OZ CLIKSEAL STRL BL (MISCELLANEOUS) ×4 IMPLANT
COVER BACK TABLE 60X90IN (DRAPES) ×2 IMPLANT
COVER WAND RF STERILE (DRAPES) ×2 IMPLANT
FILTER STRAW FLUID ASPIR (MISCELLANEOUS) IMPLANT
FORCEPS BIOP SUPERTRX PREMAR (INSTRUMENTS) ×2 IMPLANT
GAUZE SPONGE 4X4 12PLY STRL (GAUZE/BANDAGES/DRESSINGS) ×2 IMPLANT
GLOVE BIO SURGEON STRL SZ 6.5 (GLOVE) ×3 IMPLANT
KIT CLEAN ENDO COMPLIANCE (KITS) ×2 IMPLANT
KIT PROCEDURE EDGE 180 (KITS) ×1 IMPLANT
KIT PROCEDURE EDGE 90 (KITS) IMPLANT
KIT TURNOVER KIT B (KITS) ×2 IMPLANT
MARKER SKIN DUAL TIP RULER LAB (MISCELLANEOUS) ×2 IMPLANT
NDL SUPERTRX PREMARK BIOPSY (NEEDLE) IMPLANT
NEEDLE SUPERTRX PREMARK BIOPSY (NEEDLE) ×4 IMPLANT
NS IRRIG 1000ML POUR BTL (IV SOLUTION) ×2 IMPLANT
OIL SILICONE PENTAX (PARTS (SERVICE/REPAIRS)) ×2 IMPLANT
PAD ARMBOARD 7.5X6 YLW CONV (MISCELLANEOUS) ×4 IMPLANT
PATCHES PATIENT (LABEL) ×6 IMPLANT
SYR 20CC LL (SYRINGE) ×1 IMPLANT
SYR 20ML ECCENTRIC (SYRINGE) ×2 IMPLANT
SYR 3ML LL SCALE MARK (SYRINGE) ×2 IMPLANT
TOWEL GREEN STERILE (TOWEL DISPOSABLE) ×2 IMPLANT
TOWEL GREEN STERILE FF (TOWEL DISPOSABLE) ×2 IMPLANT
TRAP SPECIMEN MUCOUS 40CC (MISCELLANEOUS) ×2 IMPLANT
TUBE CONNECTING 20X1/4 (TUBING) ×2 IMPLANT
UNDERPAD 30X30 (UNDERPADS AND DIAPERS) ×2 IMPLANT
VALVE BIOPSY  SINGLE USE (MISCELLANEOUS) ×1
VALVE BIOPSY SINGLE USE (MISCELLANEOUS) ×1 IMPLANT
VALVE SUCTION BRONCHIO DISP (MISCELLANEOUS) ×2 IMPLANT
WATER STERILE IRR 1000ML POUR (IV SOLUTION) ×2 IMPLANT

## 2018-06-11 NOTE — Brief Op Note (Signed)
      [address]       [place] [address]             [id number]    06/11/2018  2:46 PM  PATIENT:  [name]  73 y.o. male  PRE-OPERATIVE DIAGNOSIS:  LUL LUNG LESION  POST-OPERATIVE DIAGNOSIS:  LUL LUNG LESION  PROCEDURE:  Procedure(s): VIDEO BRONCHOSCOPY WITH ENDOBRONCHIAL NAVIGATION (N/A) bx right and left upper lobe   SURGEON:  Surgeon(s) and Role:    * Grace Isaac, MD - Primary   ANESTHESIA:   general  EBL:  None   BLOOD ADMINISTERED:none  DRAINS: none   LOCAL MEDICATIONS USED:  NONE  SPECIMEN:  Source of Specimen:  right upper lobe and left upper lobe   DISPOSITION OF SPECIMEN:  PATHOLOGY  COUNTS:  YES  TOURNIQUET:  * No tourniquets in log *  DICTATION: .Dragon Dictation  PLAN OF CARE: Discharge to home after PACU  PATIENT DISPOSITION:  PACU - hemodynamically stable.   Delay start of Pharmacological VTE agent (>24hrs) due to surgical blood loss or risk of bleeding: yes

## 2018-06-11 NOTE — H&P (Signed)
Miguel Wolfe       Miguel Wolfe,Miguel Wolfe [id number]             [phone]                    Miguel Wolfe [facility] Medical Record #[id number] Date of Birth: [birth year]  Referring: No ref. provider found Primary Care: Miguel Fess, MD Primary Cardiologist: Miguel Furbish, MD  Chief Complaint:    bilateral lung mass  History of Present Illness:    Miguel Wolfe 73 y.o. male ihas been followed in the office for lung nodues.  .  He has had a navigation bronchoscopy and biopsy of right and left upper lobe lesions. , path was neghative His case was discussed at the multidisciplinary thoracic oncology conference , a repeat CT scan was done last [facility]  and was reviewed at the Miguel Wolfe.  I discussed with the patient having left upper lobectomy in the past but he wanted to wait until a follow-up scan was done.    CT scan of the chest demonstrating multiple pulmonary nodules and groundglass opacities.  The patient is followed by Dr. Marlou Porch for known coronary artery disease.  In February 2018 he had coronary stent placed.  He was maintained on Plavix for a year but since has discontinued it.  He has  seen by Dr. Marlou Porch in the office for vague left chest discomfort.  A stress test was performed  Study Highlights    Nuclear stress EF: 55%.  Blood pressure demonstrated a normal response to exercise.  There was no ST segment deviation noted during stress.  Defect 1: There is a small defect of mild severity present in the apical inferior and apical lateral location.  Findings consistent with mild ischemia.  The left ventricular ejection fraction is normal (55-65%).       In addition to the stress test chest x-ray showed a right lung nodule, this led to a CT scan of the chest which shows multiple pulmonary nodules and groundglass opacities.   The patient is a very limited smoker having smoked for less than 2 years between [place]  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  He he denies any environmental exposures, not aware of any asbestos exposure.  He does do some woodworking with wood dust exposure.  Patient denies any shortness of breath these had no hemoptysis no fever chills no cough.  Patient has had no chest x-rays or CT scans of the chest in the last 10 years that he is aware of.  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 - MARGINS NOT INVOLVED. - TUMOR CONFINED WITHIN THYROID CAPSULE.   Recent ct scan stable but still suggestive of lung ca, especial left upper lobe .    Current Activity/ Functional Status:  Patient is independent with mobility/ambulation, transfers, ADL's, IADL's.   Zubrod Score: At the time of surgery this patients most appropriate activity status/level should be described as: [x]     0    Normal activity, no symptoms []     1    Restricted in physical strenuous activity but ambulatory, able to do out light work []     2    Ambulatory and capable of self care, unable  to do work activities, up and about               >50 % of waking hours                              []     3    Only limited self care, in bed greater than 50% of waking hours []     4    Completely disabled, no self care, confined to bed or chair []     5    Moribund   Past Medical History:  Diagnosis Date   Arthritis    Colon polyps    Coronary artery disease involving native coronary artery of native heart without angina pectoris 04/23/2016   Myoview 02/08/16: EF 51, inf-lat, apical lateral scar with peri-infarct ischemia; intermediate risk  // LHC 2/18: dLAD 20, oD1 20, pLCx 20, pRCA 80, mRCA 20, dRCA 30, AM 90 >> PCI: 4 x 24 mm Synergy DES to pRCA    Family history of adverse reaction to anesthesia    Mom vomits and Dad went "nuts"   Fatigue     GERD (gastroesophageal reflux disease)    Hemorrhoids    History of echocardiogram    Echo 02/08/16: Mild LVH, EF 55-60, no RWMA, Gr 1 DD, trivial AI, mild LAE   Hyperlipidemia    Hypertension    PONV (postoperative nausea and vomiting)    Pre-diabetes    Pulmonary nodules     Past Surgical History:  Procedure Laterality Date   CARDIAC CATHETERIZATION     COLONOSCOPY WITH ESOPHAGOGASTRODUODENOSCOPY (EGD)  2008   CORONARY STENT INTERVENTION N/A 02/20/2016   Procedure: Coronary Stent Intervention;  Surgeon: Burnell Blanks, MD;  Location: [facility];  Service: Cardiovascular;  Laterality: N/A;   ESOPHAGOGASTRODUODENOSCOPY     LEFT HEART CATH AND CORONARY ANGIOGRAPHY N/A 02/20/2016   Procedure: Left Heart Cath and Coronary Angiography;  Surgeon: Burnell Blanks, MD;  Location: [facility];  Service: Cardiovascular;  Laterality: N/A;   PILONIDAL CYCT     REFRACTIVE SURGERY     retina tear repair   THYROIDECTOMY N/A 01/17/2018   Procedure: TOTAL THYROIDECTOMY;  Surgeon: Armandina Gemma, MD;  Location: [facility];  Service: General;  Laterality: N/A;   VIDEO BRONCHOSCOPY WITH ENDOBRONCHIAL NAVIGATION N/A 11/18/2017   Procedure: VIDEO BRONCHOSCOPY WITH ENDOBRONCHIAL NAVIGATION WITH BIOPSIES OF LEFT AND RIGHT UPPER LOBES;  Surgeon: Grace Isaac, MD;  Location: [facility];  Service: Thoracic;  Laterality: N/A;    Family History  Problem Relation Age of Onset   Heart attack Father 3       BYPASS   Crohn's disease Sister    Colon cancer Paternal Uncle    Healthy Sister    Patient's daughter had a history of thyroid cancer status post thyroidectomy  Social History   Tobacco Use  Smoking Status Former Smoker   Types: Cigarettes   Last attempt to quit: 02/01/1967   Years since quitting: 51.3  Smokeless Tobacco Never Used    Social History   Substance and Sexual Activity  Alcohol Use Yes   Comment: a drink before dinner a few times a week      Allergies  Allergen Reactions   Gemfibrozil Other (See Comments)    Muscle pain   Niaspan [Niacin Er] Rash and Other (See Comments)    Flushing - aspirin did not mitigate    Simvastatin  Other (See Comments)    Drug-Drug interaction with amlodipine    Current Facility-Administered Medications  Medication Dose Route Frequency Provider Last Rate Last Dose   lactated ringers infusion   Intravenous Continuous Barnet Glasgow, MD 10 mL/hr at 06/11/18 1138     lactated ringers infusion   Intravenous Continuous Barnet Glasgow, MD        Pertinent items are noted in HPI.     PHYSICAL EXAMINATION: BP 127/79    Pulse (!) 59    Temp 98.2 F (36.8 C) (Oral)    Resp 18    Ht 5\' 11"  (1.803 m)    Wt 78.5 kg    SpO2 96%    BMI 24.13 kg/m  General appearance: alert and cooperative Head: Normocephalic, without obvious abnormality, atraumatic Neck: no adenopathy, no carotid bruit, no JVD, supple, symmetrical, trachea midline and thyroid not enlarged, symmetric, no tenderness/mass/nodules Lymph nodes: Cervical, supraclavicular, and axillary nodes normal. Resp: clear to auscultation bilaterally Back: symmetric, no curvature. ROM normal. No CVA tenderness. Cardio: regular rate and rhythm, S1, S2 normal, no murmur, click, rub or gallop GI: soft, non-tender; bowel sounds normal; no masses,  no organomegaly Extremities: extremities normal, atraumatic, no cyanosis or edema and Homans sign is negative, no sign of DVT Neurologic: Grossly normal   Diagnostic Studies & Laboratory data:     Recent Radiology Findings:  Ct Chest Wo Contrast  Result Date: 05/28/2018 CLINICAL DATA:  Left chest pain.  Follow up lung nodule. EXAM: CT CHEST WITHOUT CONTRAST TECHNIQUE: Multidetector CT imaging of the chest was performed following the standard protocol without IV contrast. COMPARISON:  Multiple exams, including 02/26/2018 FINDINGS: Cardiovascular: Atherosclerotic calcification of the aortic arch,  left anterior descending, right left circumflex coronary arteries. Mediastinum/Nodes: Prior thyroidectomy. No pathologic adenopathy observed. Lungs/Pleura: Biapical pleuroparenchymal scarring. Right upper lobe ground-glass density pulmonary nodule 3.0 cm in long axis, previously the same on oldest available comparison of 10/23/2017 by my measurements. Peribronchovascular nodules in the left upper lobe are present with a solid nodule measuring 1.3 by 0.9 cm on image 42/3 (formerly the same on 10/23/2017) and ground-glass density components including a 1.1 cm component on image 31/3 and a 1.3 by 0.5 cm component on image 32/3, these likewise appear stable from the earliest available comparison of 10/23/2017. A 5 mm sub solid nodule in the left upper lobe on image 53/3 is stable. Lingular scarring, stable. Back on 10/23/2017 there was a 10 mm solid-appearing right lower lobe pulmonary nodule; in this vicinity on today's exam there is only some faint scarring on image 111/3, that nodule is otherwise resolved. Upper Abdomen: The thoracoabdominal junction, a right paraspinal soft tissue density measuring 2.4 by 1.7 cm has some faint calcification along its anterior margin. This is immediately adjacent to the right side of the L1 vertebral body and may slightly scallop the vertebral body margin although there is intact cortex. This lesion measured the same on 10/23/2017, and only head low-grade metabolic activity on prior PET-CT. Abdominal aortic atherosclerotic calcification is present. Musculoskeletal: Unremarkable IMPRESSION: 1. Bilateral upper lobe ground-glass density pulmonary nodules, with a peribronchovascular solid nodular component in the left upper lobe. These nodules are stable compared to the earliest available comparison of 10/23/2017. Low-grade adenocarcinoma not excluded for the ground-glass density nodules, and the solid component of the left upper lobe nodule is nonspecific but bronchial carcinoid tumor  not excluded. We have not demonstrated 7 months of stability. Continued surveillance is recommended. 2. In the right paraspinal space  at L1 there is 2.4 by 1.7 cm soft tissue density oval-shaped nodule with faint anterior calcification which has low-grade activity but not overtly hypermetabolic activity at PET-CT. Stable since earliest available comparison of 10/23/2017. This is probably a benign lesion such as schwannoma or neurofibroma, but surveillance of this lesion is also suggested. 3.  Aortic Atherosclerosis (ICD10-I70.0).  Coronary atherosclerosis. 4. Prior thyroidectomy. Electronically Signed   By: Van Clines M.D.   On: 05/28/2018 16:02    I have independently reviewed the above radiology studies  and reviewed the findings with the patient.     Dg Chest 2 View  Result Date: 10/14/2017 CLINICAL DATA:  LEFT axillary chest pain for 4-6 weeks, no known injury, history hypertension, former smoker, coronary artery disease post stenting EXAM: CHEST - 2 VIEW COMPARISON:  None FINDINGS: Normal heart size, mediastinal contours, and pulmonary vascularity. Coronary stent noted. Atherosclerotic calcification aorta. Minimal subsegmental atelectasis LEFT base. 10 mm nodular density lower lateral RIGHT lung, doubt nipple shadow from inferior location. Remaining lungs clear. No infiltrate, pleural effusion or pneumothorax. No acute osseous findings. IMPRESSION: Minimal LEFT basilar atelectasis with a 10 mm nonspecific nodular density at the lateral RIGHT lung base; pulmonary nodule not excluded and further assessment by CT chest recommended to exclude pulmonary nodule/tumor. These results will be called to the ordering clinician or representative by the Radiologist Assistant, and communication documented in the PACS or zVision Dashboard. Electronically Signed   By: Lavonia Dana M.D.   On: 10/14/2017 14:22   Ct Chest Wo Contrast  Result Date: 10/24/2017 CLINICAL DATA:  Evaluate pulmonary nodule. Abnormal  chest radiograph. EXAM: CT CHEST WITHOUT CONTRAST TECHNIQUE: Multidetector CT imaging of the chest was performed following the standard protocol without IV contrast. COMPARISON:  None FINDINGS: Cardiovascular: The heart size appears mildly enlarged. Aortic atherosclerosis. Calcification in the LAD left circumflex and RCA coronary artery noted. Mediastinum/Nodes: No enlarged mediastinal or axillary lymph nodes. The trachea appears patent and is midline. Normal appearance of the esophagus. Right lobe of thyroid gland nodule measures 1.7 cm, image 18/2. Lungs/Pleura: Bilateral mixed attenuation pulmonary nodules identified. Within the right upper lobe there is a pure ground-glass attenuating nodule which measures 2.7 cm, image 38/3. Solid-appearing nodule within the right lower lobe measures 1 cm, image 110/3. Ground-glass attenuating nodule within the left upper lobe measures 5 mm, image 55/3. Within the left upper lobe there is a mixed ground-glass and solid attenuating nodule. This measures 2 cm with a central solid component of 1.4 cm, image 61/5. Several adjacent areas of ground-glass nodularity are identified including a 1 cm left apical nodule, image 33/3. Upper Abdomen: No acute abnormality. Musculoskeletal: No chest wall mass or suspicious bone lesions identified. IMPRESSION: 1. Bilateral ground-glass, solid, and part solid pulmonary nodules are identified. Within the left upper lobe there is a part solid nodule measuring 2 cm with a 1.4 cm solid component. In the right lower lobe there is a solid subpleural nodule measuring 1 cm. 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 Thoracic surgery consultation is recommended. 2.  Aortic Atherosclerosis (ICD10-I70.0). 3. Multi vessel coronary artery atherosclerotic calcification 4. Thyroid nodule measuring 1.7 cm noted. Consider further evaluation with thyroid ultrasound. If patient is clinically hyperthyroid, consider nuclear medicine thyroid uptake and  scan. Electronically Signed   By: Kerby Moors M.D.   On: 10/24/2017 11:31    Recent Lab Findings: Lab Results  Component Value Date   WBC 5.6 06/09/2018   HGB 15.3 06/09/2018   HCT 46.5 06/09/2018  PLT 174 06/09/2018   GLUCOSE 119 (H) 06/09/2018   CHOL 151 01/21/2017   TRIG 177 (H) 01/21/2017   HDL 48 01/21/2017   LDLCALC 68 01/21/2017   ALT 16 06/09/2018   AST 25 06/09/2018   NA 140 06/09/2018   K 3.7 06/09/2018   CL 102 06/09/2018   CREATININE 0.84 06/09/2018   BUN 12 06/09/2018   CO2 29 06/09/2018   TSH 10.736 (H) 01/24/2018   INR 1.0 06/09/2018   HGBA1C 5.9 (H) 06/09/2018   Cath February 2018, most recent  Acute Mrg lesion, 90 %stenosed.  Mid RCA lesion, 20 %stenosed.  Dist RCA lesion, 30 %stenosed.  Prox Cx to Mid Cx lesion, 20 %stenosed.  1st Diag lesion, 20 %stenosed.  Dist LAD lesion, 20 %stenosed.  A STENT SYNERGY DES [id number] drug eluting stent was successfully placed.  Prox RCA to Mid RCA lesion, 80 %stenosed.  Post intervention, there is a 0% residual stenosis.   1. Severe single vessel CAD (severe stenosis mid RCA) 2. Successful PTCA/DES x 1 mid RCA  3. Mild non-obstructive disease Circumflex and LAD  Recommendations: Will continue DAPT with ASA and Plavix for one year. If he needs to interrupt anti-platelet therapy for a toe surgery, this could be considered at 3 months since a Synergy DES was placed, although it would be optimal to continue for at least 6 months. Will discharge home today as part of the same day PCI protocol. Will changed Prilosec to Protonix 40 mg daily. He will be started on Plavix 75 mg daily. Follow up with Dr. Marlou Porch or office APP in 1 week.   PFT's 3.16 93% 33.61 99% The FVC, FEV1, FEV1/FVC ratio and FEF25-75% are within normal limits, but there is curvature to the flow volume loop suggesting minimal small airway disease. The airway resistance is normal. Lung volumes are within normal limits. Following administration of  bronchodilators, there is no significant response. The diffusing capacity is normal. However, the diffusing capacity was not corrected for the patient's hemoglobin. Conclusions: Minimal airway obstruction is present suggesting small airway disease. 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 I have again discussed with the patient proceeding with resection of left upper lesion, he did not wish to immediately proceed with lung resection but was willing to repeat a navigation bronchoscopy with specific attention to the left upper lobe lesion.  #2 history of coronary artery disease status post stenting  #3 1.7 cm right thyroid nodulehas been resected - papillary ca of thyroid after total thyroidectomy  The goals risks and alternatives of the planned surgical procedure Procedure(s): VIDEO BRONCHOSCOPY WITH ENDOBRONCHIAL NAVIGATION (N/A)  have been discussed with the patient in detail. The risks of the procedure including death, infection, stroke, myocardial infarction, bleeding, blood transfusion have all been discussed specifically.  I have quoted Miguel Wolfe a 1 % of perioperative mortality and a complication rate as high as 10%. The patient's questions have been answered.Miguel Wolfe is willing  to proceed with the planned procedure.  Grace Isaac MD      [address] [place] [address] Office [phone]   Beeper [phone]  06/11/2018 11:54 AM

## 2018-06-11 NOTE — Anesthesia Procedure Notes (Addendum)
Procedure Name: Intubation Date/Time: 06/11/2018 12:35 PM Performed by: Bryson Corona, CRNA Pre-anesthesia Checklist: Patient identified, Emergency Drugs available, Suction available and Patient being monitored Patient Re-evaluated:Patient Re-evaluated prior to induction Oxygen Delivery Method: Circle System Utilized Preoxygenation: Pre-oxygenation with 100% oxygen Induction Type: IV induction and Rapid sequence Laryngoscope Size: Mac and 4 Grade View: Grade II Tube type: Oral Tube size: 7.0 mm Number of attempts: 1 Airway Equipment and Method: Stylet and Oral airway Placement Confirmation: ETT inserted through vocal cords under direct vision,  positive ETCO2 and breath sounds checked- equal and bilateral Secured at: 23 cm Tube secured with: Tape Dental Injury: Teeth and Oropharynx as per pre-operative assessment  Comments: Grade 2b view.

## 2018-06-11 NOTE — Discharge Instructions (Signed)
Flexible Bronchoscopy, Care After This sheet gives you information about how to care for yourself after your procedure. Your health care provider may also give you more specific instructions. If you have problems or questions, contact your health care provider. What can I expect after the procedure? After the procedure, it is common to have the following symptoms for 24-48 hours:  A cough that is worse than it was before the procedure.  A low-grade fever.  A sore throat or hoarse voice.  Small streaks of blood in the mucus from your lungs (sputum), if tissue samples were removed (biopsy). Follow these instructions at home: Eating and drinking  Do not eat or drink anything (including water) for 2 hours after your procedure, or until your numbing medicine (local anesthetic) has worn off. Having a numb throat increases your risk of burning yourself or choking.  After your numbness is gone and your cough and gag reflexes have returned, you may start eating only soft foods and slowly drinking liquids.  The day after the procedure, return to your normal diet. Driving  Do not drive for 24 hours if you were given a medicine to help you relax (sedative).  Do not drive or use heavy machinery while taking prescription pain medicine. General instructions   Take over-the-counter and prescription medicines only as told by your health care provider.  Return to your normal activities as told by your health care provider. Ask your health care provider what activities are safe for you.  Do not use any products that contain nicotine or tobacco, such as cigarettes and e-cigarettes. If you need help quitting, ask your health care provider.  Keep all follow-up visits as told by your health care provider. This is important, especially if you had a biopsy taken. Get help right away if:  You have shortness of breath that gets worse.  You become light-headed or feel like you might faint.  You have  chest pain.  You cough up more than a small amount of blood.  The amount of blood you cough up increases. Summary  Common symptoms in the 24-48 hours following a flexible bronchoscopy include cough, low-grade fever, sore throat or hoarse voice, and blood-streaked mucus from the lungs (if you had a biopsy).  Do not eat or drink anything (including water) for 2 hours after your procedure, or until your local anesthetic has worn off. You can return to your normal diet the day after the procedure.  Get help right away if you develop worsening shortness of breath, have chest pain, become light-headed, or cough up more than a small amount of blood. This information is not intended to replace advice given to you by your health care provider. Make sure you discuss any questions you have with your health care provider. Document Released: 07/14/2004 Document Revised: 01/13/2016 Document Reviewed: 01/13/2016 Elsevier Interactive Patient Education  2019 [employer].

## 2018-06-11 NOTE — Anesthesia Postprocedure Evaluation (Signed)
Anesthesia Post Note  Patient: Miguel Wolfe  Procedure(s) Performed: VIDEO BRONCHOSCOPY WITH ENDOBRONCHIAL NAVIGATION (N/A )     Patient location during evaluation: PACU Anesthesia Type: General Level of consciousness: awake and alert Pain management: pain level controlled Vital Signs Assessment: post-procedure vital signs reviewed and stable Respiratory status: spontaneous breathing, nonlabored ventilation, respiratory function stable and patient connected to nasal cannula oxygen Cardiovascular status: blood pressure returned to baseline and stable Postop Assessment: no apparent nausea or vomiting Anesthetic complications: no    Last Vitals:  Vitals:   06/11/18 1456 06/11/18 1511  BP: 110/62 121/76  Pulse: 67 73  Resp: 20 19  Temp: (!) 36.1 C   SpO2: 95% 92%    Last Pain:  Vitals:   06/11/18 1124  TempSrc:   PainSc: 0-No pain                 Barnet Glasgow

## 2018-06-11 NOTE — Transfer of Care (Signed)
Immediate Anesthesia Transfer of Care Note  Patient: [name]  Procedure(s) Performed: VIDEO BRONCHOSCOPY WITH ENDOBRONCHIAL NAVIGATION (N/A )  Patient Location: PACU  Anesthesia Type:General  Level of Consciousness: drowsy  Airway & Oxygen Therapy: Patient Spontanous Breathing and Patient connected to face mask oxygen  Post-op Assessment: Report given to RN and Post -op Vital signs reviewed and stable  Post vital signs: Reviewed and stable  Last Vitals:  Vitals Value Taken Time  BP 110/62 06/11/2018  2:56 PM  Temp 36.1 C 06/11/2018  2:56 PM  Pulse 71 06/11/2018  2:58 PM  Resp 21 06/11/2018  2:58 PM  SpO2 95 % 06/11/2018  2:58 PM  Vitals shown include unvalidated device data.  Last Pain:  Vitals:   06/11/18 1124  TempSrc:   PainSc: 0-No pain         Complications: No apparent anesthesia complications

## 2018-06-12 ENCOUNTER — Encounter (HOSPITAL_COMMUNITY): Payer: Self-pay | Admitting: Cardiothoracic Surgery

## 2018-06-12 NOTE — Op Note (Signed)
NAME: Miguel Wolfe, Miguel Wolfe MEDICAL RECORD [id number] ACCOUNT [id number] DATE OF BIRTH:Jul 16, 1945 FACILITY: [facility] LOCATION: [facility] PHYSICIAN:Annaka Cleaver Maryruth Bun, MD  OPERATIVE REPORT  DATE OF PROCEDURE:  06/11/2018  PREOPERATIVE DIAGNOSIS:  Bilateral lung nodules.  POSTOPERATIVE DIAGNOSIS:  Bilateral lung nodules.  SURGICAL PROCEDURE:  Repeat bronchoscopy and navigation bronchoscopy with lung biopsy of the left upper lobe and right upper lobe lesion.  SURGEON:  Lanelle Bal, MD  BRIEF HISTORY:  The patient is a 73 year old male who had presented in the fall of 2019 with multiple lung nodules including left upper lobe, right upper lobe and right lower lobe.  Each were ground glass, but with a slightly more solid component in the  left upper lobe.  On PET scan, the patient was also noted to have significant hypermetabolic area in the thyroid.  In November of 2019, he underwent navigational bronchoscopy with biopsy of lung lesions.  None of the specimens was malignant.  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  Consideration for left upper lobectomy was discussed with the patient as the left lung lesion was the most suspicious.  He preferred to wait, and followup scan showed  resolution of the right lower lobe lesion, but on continued followup, the area in the left upper lobe and right upper lobe, although not significantly changed in size, has persisted.  Again, consideration for surgical resection was discussed with the  patient, which he did not wish to proceed with but was willing to repeat navigation bronchoscopy.  This was described to him again, and he agreed and signed informed consent.  With the updated Super D CT scan, a navigation plan was created with  navigation, both to the left upper and right upper lobe lesions.  This was then loaded in the intraoperative system.  DESCRIPTION OF PROCEDURE:  The patient underwent general  endotracheal anesthesia without incident with a single-lumen endotracheal tube.  Appropriate timeout was performed, and then we proceeded with video bronchoscopy with a 2.8 mm video bronchoscope.   No endobronchial lesions were noted.  The scope then positioned for registration, and using the LG probe, the Super D system was then registered appropriately.  We navigated first to the left upper lobe lesion with 180-degree working channel, and the  navigation took [place] almost directly within 0.5 cm of the mass.  We then launched fluoroscopic system to standardize the lesion.  With this done, we then proceeded with multiple brushings, biopsies and aspirations of the left upper lobe lesion,  intermittently checking our position.  This position almost exactly matched the position used previously in November.  Initial areas showed some atypia in this area.  Further biopsies with small biopsy forceps were obtained.  We then proceeded to biopsy  the right upper lobe lesion, which was slightly larger but was a more ground glass consistency.  In a similar fashion, we were able to navigate to the lesion and used fluoroscopic system to further localize the suspect lesion.  With this navigated to  within 0.5 cm, we obtained multiple aspirations, brushings, and biopsies.  With satisfactory obtained specimens, we submitted biopsies to the pathologist for permanent sections.  The scope was removed.  The bronchus was cleared of all secretions.   Fluoroscopic images of the right and left lung apices did not show any evidence of pneumothorax.  The patient was then awakened in the operating room and transferred to the recovery room for postoperative observation.  He tolerated the procedure without  obvious complication.  Blood loss was none.  LN/NUANCE  D:06/11/2018 T:06/12/2018 JOB:[id number]

## 2018-06-13 ENCOUNTER — Other Ambulatory Visit: Payer: Self-pay

## 2018-06-13 ENCOUNTER — Ambulatory Visit (INDEPENDENT_AMBULATORY_CARE_PROVIDER_SITE_OTHER): Payer: Medicare Other | Admitting: Cardiothoracic Surgery

## 2018-06-13 VITALS — BP 136/73 | HR 69 | Temp 97.8°F | Resp 20 | Ht 71.0 in | Wt 173.0 lb

## 2018-06-13 DIAGNOSIS — I209 Angina pectoris, unspecified: Secondary | ICD-10-CM | POA: Diagnosis not present

## 2018-06-13 DIAGNOSIS — Z9889 Other specified postprocedural states: Secondary | ICD-10-CM

## 2018-06-13 DIAGNOSIS — R911 Solitary pulmonary nodule: Secondary | ICD-10-CM

## 2018-06-13 NOTE — Patient Instructions (Addendum)
Lung Resection, Care After This sheet gives you information about how to care for yourself after your procedure. Your health care provider may also give you more specific instructions. If you have problems or questions, contact your health care provider. What can I expect after the procedure? After the procedure, it is common to have:  Pain in your throat and near your incisions.  Pain when taking deep breaths.  Nausea.  Tiredness (fatigue). Follow these instructions at home:  Medicines  Take over-the-counter and prescription medicines only as told by your health care provider.  If you were prescribed an antibiotic medicine, take it as told by your health care provider. Do not stop taking the antibiotic even if you start to feel better.  If you are taking prescription pain medicine, take actions to prevent or treat constipation. Your health care provider may recommend that you: ? Drink enough fluid to keep your urine pale yellow. ? Eat foods that are high in fiber, such as fresh fruits and vegetables, whole grains, and beans. ? Limit foods that are high in fat and processed sugars, such as fried or sweet foods. ? Take an over-the-counter or prescription medicine for constipation. Incision care  Follow instructions from your health care provider about how to take care of your incisions. Make sure you: ? Wash your hands with soap and water before you change your bandage (dressing). If soap and water are not available, use hand sanitizer. ? Change your dressing as told by your health care provider. ? Leave stitches (sutures), skin glue, or adhesive strips in place. These skin closures may need to stay in place for 2 weeks or longer. If adhesive strip edges start to loosen and curl up, you may trim the loose edges. Do not remove adhesive strips completely unless your health care provider tells you to do that.  Check your incision area every day for signs of infection. Check for: ?  Redness, swelling, or pain. ? Fluid or blood. ? Pus or a bad smell. ? Warmth.  Do not take baths, swim, or use a hot tub until your health care provider approves. Ask your health care provider if you may take showers. Preventing pneumonia   Do breathing exercises as instructed by your health care provider. Doing this helps prevent lung infection (pneumonia).  Try to breathe deeply and cough as told by your health care provider. Holding a pillow firmly over your ribs may help with discomfort.  If you were given an incentive spirometer in the hospital, continue to use it as directed by your health care provider.  Participate in pulmonary rehabilitation as directed by your health care provider. This is a program that combines education, exercise, and support from a team of specialists. The goal is to help you heal and get back to your normal activities as soon as possible. Activity  Rest as told by your health care provider.  Avoid sitting for a long time without moving. Get up to take short walks every 1-2 hours. This is important to improve blood flow and breathing. Ask for help if you feel weak or unsteady.  Ask your health care provider what activities are safe for you.  Do not lift anything that is heavier than 10 lb (4.5 kg), or the limit that you are told, until your health care provider says that it is safe.  Return to a normal diet and activities as told by your health care provider. General instructions  Wear compression stockings as told by your  health care provider. These stockings help to prevent blood clots and reduce swelling in your legs.  If you have a chest tube, care for it as instructed by your health care provider. Do not travel by airplane during the 2 weeks after your chest tube is removed, or until your health care provider says that this is safe.  Do not use any products that contain nicotine or tobacco, such as cigarettes and e-cigarettes. These can delay  healing after surgery. If you need help quitting, ask your health care provider.  Do not drive until your health care provider approves.  Do not drive or use heavy machinery while taking prescription pain medicine.  Keep all follow-up visits as told by your health care provider. This is important. Contact a health care provider if you:  Have redness, swelling, or pain around your incision.  Have fluid or blood coming from your incision.  Have pus or a bad smell coming from your incision or bandage.  Have an incision that feels warm to the touch.  Have a fever or chills.  Notice that your incision is breaking open.  Cough up blood or pus, or you develop a cough that produces bad-smelling sputum.  Have pain or swelling in your legs.  Have increasing pain that is not controlled with medicine.  Have trouble managing any of the tubes that have been left in place after surgery. Get help right away if you:  Have chest pain or an irregular or rapid heartbeat.  Feel weak, light-headed, or dizzy.  Have shortness of breath or difficulty breathing.  Have persistent nausea or vomiting.  Have a rash. These symptoms may represent a serious problem that is an emergency. Do not wait to see if the symptoms will go away. Get medical help right away. Call your local emergency services (911 in the [place] Do not drive yourself to the hospital. Summary  After lung resection surgery, it is common to have pain around your incisions, pain when taking deep breaths, nausea, and fatigue.  Follow instructions from your health care provider about how to take care of your incisions.  Be sure to contact your health care provider if you have any redness or swelling around your incision area or if blood, pus, or other fluid drains from your incision. This information is not intended to replace advice given to you by your health care provider. Make sure you discuss any questions you have with your health  care provider. Document Released: 07/14/2004 Document Revised: 01/07/2017 Document Reviewed: 01/07/2017 Elsevier Interactive Patient Education  2019 [employer] Lung Resection  A lung resection is a procedure to remove part or all of a lung. An entire lung may be removed (pneumonectomy), or only part of it may be removed (lobectomy). A lung resection may be done as an open surgery or as a minimally invasive surgery. A lung resection is most often done to remove a tumor or cancer, but it may be done to treat other conditions. The procedure can relieve symptoms and keep the problem from getting worse. Tell a health care provider about:  Any allergies you have.  All medicines you are taking, including vitamins, herbs, eye drops, creams, and over-the-counter medicines.  Any problems you or family members have had with anesthetic medicines.  Any blood disorders you have.  Any surgeries you have had.  Any medical conditions you have.  Whether you are pregnant or may be pregnant. What are the risks? Generally, this is a safe procedure. However,  problems may occur, including:  Excessive bleeding.  Infection.  Reaction to anesthesia.  Allergic reaction to medicines.  Blood clots.  Injury to a nerve or blood vessel.  Problems breathing.  Heart problems.  Stroke. What happens before the procedure? Staying hydrated Follow instructions from your health care provider about hydration, which may include:  Up to 2 hours before the procedure - you may continue to drink clear liquids, such as water, clear fruit juice, black coffee, and plain tea. Eating and drinking restrictions Follow instructions from your health care provider about eating and drinking, which may include:  8 hours before the procedure - stop eating heavy meals or foods such as meat, fried foods, or fatty foods.  6 hours before the procedure - stop eating light meals or foods, such as toast or cereal.  6 hours  before the procedure - stop drinking milk or drinks that contain milk.  2 hours before the procedure - stop drinking clear liquids. Medicines Ask your health care provider about:  Changing or stopping your regular medicines. This is especially important if you are taking diabetes medicines or blood thinners.  Taking medicines such as aspirin and ibuprofen. These medicines can thin your blood. Do not take these medicines unless your health care provider tells you to take them.  Taking over-the-counter medicines, vitamins, herbs, and supplements. Tests You may have tests done before the procedure, including:  Blood and urine tests.  Imaging tests, such as X-rays, CT, MRI, or PET scans.  Bronchoscopy. In this procedure, a health care provider uses a flexible tube (bronchoscope) to look at the inside of your airways.  Pulmonary function tests. These are done to check how well your lungs work.  Heart testing. This is done to check heart function before the procedure.  Lymph node sampling. This may be done to see if you have a tumor that has spread. General instructions  Plan to have someone take you home from the hospital or clinic.  Plan to have a responsible adult care for you for at least 24 hours after you leave the hospital or clinic. This is important.  Do not use any products that contain nicotine or tobacco for as long as possible before your procedure. These include cigarettes and e-cigarettes. If you need help quitting, ask your health care provider.  Ask your health care provider what steps will be taken to help prevent infection. These may include: ? Removing hair at the surgery site. ? Washing skin with a germ-killing soap. ? Taking antibiotic medicine. What happens during the procedure?  An IV will be inserted into one of your veins. You will be given one or both of the following: ? A medicine to help you relax (sedative). ? A medicine to make you fall asleep  (general anesthetic).  A breathing tube will be placed in your throat.  A thin tube (catheter) may be inserted into the part of your body that drains urine from the bladder (urethra). The catheter will drain your urine.  Your health care provider will make a large incision on your side (open lung surgery) or several small incisions over your chest area (minimally invasive surgery).  Your health care provider will carefully cut any tissues leading to the area of the lung being treated.  The lung or part of the lung will then be removed. Lymph nodes near the lung may also be removed for testing.  Your health care provider will check inside your chest to make sure there is  no bleeding in or around the lungs.  Your health care provider may put tubes into your chest to drain extra fluid and air after surgery.  Your incisions will be closed. This may be done using stitches (sutures), staples, skin glue, or skin tape (adhesive) strips.  A bandage (dressing) may be placed over your incisions. The procedure may vary among health care providers and hospitals. What happens after the procedure?  Your blood pressure, heart rate, breathing rate, and blood oxygen level will be monitored until you leave the hospital or clinic.  Right after surgery, you may: ? Be moved to the intensive care unit (ICU). ? Continue to have a tube to help you breathe or have a urinary catheter. ? Have an IV for fluids and medicines. ? Remain on a respirator, if assistance is needed to help you breathe. ? Start respiratory therapy in the ICU. This will help your other lung to get strong and stay healthy. ? Be given medicine to help with pain and nausea.  You may have to wear compression stockings. These stockings help to prevent blood clots and reduce swelling in your legs.  As you continue to recover: ? You will be moved to a regular hospital room. Therapy will continue. ? You may be released to go home or to an  extended care facility. Summary  A lung resection is a procedure to remove part or all of a lung. It can be done as an open surgery or a minimally invasive surgery.  A lung resection is most often done to remove a tumor or cancer, but it may be done to treat other conditions.  After surgery, respiratory therapy will be prescribed to help your lung recover and become stronger. This information is not intended to replace advice given to you by your health care provider. Make sure you discuss any questions you have with your health care provider. Document Released: 03/17/2002 Document Revised: 01/07/2017 Document Reviewed: 01/07/2017 Elsevier Interactive Patient Education  2019 [employer] [facility] Video-assisted thoracic surgery (VATS) is a procedure that allows your surgeon to look inside your chest and perform minor procedures as needed. The thoracic area is between the neck and abdomen. VATS is commonly done to: Study or diagnose problems in the chest. Remove a tissue sample (biopsy) to be examined. Put medicines directly into the chest. Remove collections of fluid, pus, or blood. Remove tumors. Remove a part of a lung (lobectomy). Treat some problems with the spine, including: Abnormal curves (scoliosis or kyphosis). Breaks (fractures). Tumors. VATS is done using thoracoscopy. Thoracoscopy is a procedure in which a thin tube with a light and camera on the end (thoracoscope) is inserted through a small incision in the chest wall. The thoracoscope sends images to a video monitor that your surgeon will use to view the inside of your chest. Tell a health care provider about: Any allergies you have. All medicines you are taking, including vitamins, herbs, eye drops, creams, and over-the-counter medicines. Any problems you or family members have had with anesthetic medicines. Any surgeries you have had. Any medical conditions you have. Any blood disorders you  have. Whether you are pregnant or may be pregnant. What are the risks? Generally, this is a safe procedure. However, problems may occur, including: Infection. Severe bleeding (hemorrhage). Allergic reaction to medicines. Damage to structures or organs in the chest, such as nerves. Lung infection (pneumonia). Inability to complete the procedure. If this is the case, your chest may need to be  opened with a large incision (thoracotomy). What happens before the procedure? Medicines Ask your health care provider about: Changing or stopping your regular medicines. This is especially important if you are taking diabetes medicines or blood thinners. Taking medicines such as aspirin and ibuprofen. These medicines can thin your blood. Do not take these medicines before your procedure if your health care provider instructs you not to. You may be given antibiotic medicine to help prevent infection. Staying hydrated Follow instructions from your health care provider about hydration, which may include: Up to 2 hours before the procedure - you may continue to drink clear liquids, such as water, clear fruit juice, black coffee, and plain tea. Eating and drinking restrictions Follow instructions from your health care provider about eating and drinking, which may include: 8 hours before the procedure - stop eating heavy meals or foods such as meat, fried foods, or fatty foods. 6 hours before the procedure - stop eating light meals or foods, such as toast or cereal. 6 hours before the procedure - stop drinking milk or drinks that contain milk. 2 hours before the procedure - stop drinking clear liquids. General instructions Ask your health care provider how your surgical site will be marked or identified. You may be asked to shower with a germ-killing soap. You may have a blood or urine sample taken. You may have imaging tests, such as: Chest X-ray. Electrocardiogram (ECG). Ultrasound. CT scan. Do not  use any products that contain nicotine or tobacco for as long as possible before your procedure. These include cigarettes and e-cigarettes. If you need help quitting, ask your health care provider. Plan to have someone take you home from the hospital or clinic. If you will be going home right after the procedure, plan to have someone with you for 24 hours. What happens during the procedure? To lower your risk of infection: Your health care team will wash or sanitize their hands. Your skin will be washed with soap. An IV tube will be inserted into one of your veins. You will be given one or more of the following: A medicine to make you fall asleep (general anesthetic). A medicine to help you relax (sedative). A medicine that is injected into an area of your body to numb everything below the injection site (regional anesthetic). This is less common for this procedure. It may be given if you are not able to tolerate general anesthetic. A thin tube (catheter) will be inserted into your bladder and your tube that carries urine out of your body (urethra). The catheter will drain your urine. 1-4 small incisions will be made in your chest. The number of incisions depends on the purpose of your procedure. The thoracoscope will be inserted into your incision(s) and used to view the inside of your chest. One of your lungs will be deflated. This makes it easier for your surgeon to see the area. Other surgical instruments may be inserted through your incision(s) to perform necessary procedures. After any procedures are done, your lung will be inflated. A chest tube may be inserted through an incision to drain excess fluid from the surgical area. Any remaining incisions will be closed with stitches (sutures) or staples. A bandage (dressing) may be placed over your incision(s). The procedure may vary among health care providers and hospitals. What happens after the procedure? Your blood pressure, heart rate,  breathing rate, and blood oxygen level will be monitored until the medicines you were given have worn off. You may have a  chest tube draining fluid from the surgical area. The chest tube will be closely monitored for signs of fluid or air buildup in your lungs. You may continue to receive fluids and medicines through an IV tube. You may have to wear compression stockings. These stockings help to prevent blood clots and reduce swelling in your legs. Do not drive for 24 hours if you were given a sedative. Summary Video-assisted thoracic surgery (VATS) is a procedure that allows your surgeon to look inside your chest to study or diagnose problems in your thoracic area. VATS is done by inserting a thin tube with a light and camera on the end (thoracoscope) through a small incision in the chest wall. After the procedure, you may have a chest tube draining fluid from the surgical area. The chest tube will be closely monitored for signs of fluid or air buildup in your lungs. This information is not intended to replace advice given to you by your health care provider. Make sure you discuss any questions you have with your health care provider. Document Released: 04/21/2012 Document Revised: 12/12/2015 Document Reviewed: 12/12/2015 Elsevier Interactive Patient Education  2019 [employer].

## 2018-06-13 NOTE — Progress Notes (Signed)
[address]       [place] [address]             [phone]                    [facility] L [facility] [place] [facility] Medical Record #[id number] Date of Birth: Jan 08, 1946  Referring: Jerline Pain, MD Primary Care: [name], MD Primary Cardiologist: Candee Furbish, MD  Chief Complaint:    Chief Complaint  Patient presents with   Routine Post Op    f/p ENB, lung biopsy 06/11/18, review path results    History of Present Illness:    Miguel Wolfe 74 y.o. male is seen in the office  Today after his previous evaluation of recent abnormal CT scan.  He has had a navigation bronchoscopy and biopsy of right and left upper lobe lesions. , path was neghative His case was discussed at the multidisciplinary thoracic oncology conference ,     CT scan of the chest demonstrating multiple pulmonary nodules and groundglass opacities.  The patient is followed by Dr. Marlou Porch for known coronary artery disease.  In February 2018 he had coronary stent placed.  He was maintained on Plavix for a year but since has discontinued it.  He has  seen by Dr. Marlou Porch in the office for vague left chest discomfort.  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 Lung, biopsy, LUL - ADENOCARCINOMA. SEE NOTE 2. Lung, biopsy, RUL - SCANT FRAGMENT OF BRONCHIAL WALL - NO EVIDENCE OF MALIGNANCY Diagnosis Note 1. Dr. Jeannie Done has reviewed this case and concurs with the above diagnosis. Dr. Servando Snare was notified on 06/12/2018. Jaquita Folds MD  Study Highlights    Nuclear stress EF: 55%.  Blood pressure demonstrated a normal response to exercise.  There was no ST segment deviation noted during stress.  Defect 1: There is a small defect of mild severity present in the apical inferior and apical lateral location.  Findings consistent with mild ischemia.  The left ventricular ejection  fraction is normal (55-65%).       In addition to the stress test chest x-ray showed a right lung nodule, this led to a CT scan of the chest which shows multiple pulmonary nodules and groundglass opacities.   The patient is a very limited smoker having smoked for less than 2 years between [place]  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  He he denies any environmental exposures, not aware of any asbestos exposure.  He does do some woodworking with wood dust exposure.  Patient denies any shortness of breath these had no hemoptysis no fever chills no cough.  Patient has had no chest x-rays or CT scans of the chest in the last 10 years that he is aware of.  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 - MARGINS NOT INVOLVED. - TUMOR CONFINED WITHIN THYROID CAPSULE.   Recent lung biopsy shows: Diagnosis 1. Lung, biopsy, LUL - ADENOCARCINOMA. SEE NOTE 2. Lung, biopsy, RUL - SCANT FRAGMENT OF BRONCHIAL WALL - NO EVIDENCE OF MALIGNANCY Diagnosis Note 1. Dr. Jeannie Done has reviewed this case and concurs with the above diagnosis. Dr. Servando Snare was notified on 06/12/2018. Corliss Blacker  Kashikar MD   Current Activity/ Functional Status:  Patient is independent with mobility/ambulation, transfers, ADL's, IADL's.   Zubrod Score: At the time of surgery this patients most appropriate activity status/level should be described as: [x]     0    Normal activity, no symptoms []     1    Restricted in physical strenuous activity but ambulatory, able to do out light work []     2    Ambulatory and capable of self care, unable to do work activities, up and about               >50 % of waking hours                              []     3    Only limited self care, in bed greater than 50% of waking hours []     4    Completely disabled, no  self care, confined to bed or chair []     5    Moribund   Past Medical History:  Diagnosis Date   Arthritis    Colon polyps    Coronary artery disease involving native coronary artery of native heart without angina pectoris 04/23/2016   Myoview 02/08/16: EF 51, inf-lat, apical lateral scar with peri-infarct ischemia; intermediate risk  // LHC 2/18: dLAD 20, oD1 20, pLCx 54, pRCA 80, mRCA 20, dRCA 30, AM 90 >> PCI: 4 x 24 mm Synergy DES to pRCA    Family history of adverse reaction to anesthesia    Mom vomits and Dad went "nuts"   Fatigue    GERD (gastroesophageal reflux disease)    Hemorrhoids    History of echocardiogram    Echo 02/08/16: Mild LVH, EF 55-60, no RWMA, Gr 1 DD, trivial AI, mild LAE   Hyperlipidemia    Hypertension    PONV (postoperative nausea and vomiting)    Pre-diabetes    Pulmonary nodules     Past Surgical History:  Procedure Laterality Date   CARDIAC CATHETERIZATION     COLONOSCOPY WITH ESOPHAGOGASTRODUODENOSCOPY (EGD)  2008   CORONARY STENT INTERVENTION N/A 02/20/2016   Procedure: Coronary Stent Intervention;  Surgeon: Burnell Blanks, MD;  Location: [facility];  Service: Cardiovascular;  Laterality: N/A;   ESOPHAGOGASTRODUODENOSCOPY     LEFT HEART CATH AND CORONARY ANGIOGRAPHY N/A 02/20/2016   Procedure: Left Heart Cath and Coronary Angiography;  Surgeon: Burnell Blanks, MD;  Location: [facility];  Service: Cardiovascular;  Laterality: N/A;   PILONIDAL CYCT     REFRACTIVE SURGERY     retina tear repair   THYROIDECTOMY N/A 01/17/2018   Procedure: TOTAL THYROIDECTOMY;  Surgeon: Armandina Gemma, MD;  Location: [facility];  Service: General;  Laterality: N/A;   VIDEO BRONCHOSCOPY WITH ENDOBRONCHIAL NAVIGATION N/A 11/18/2017   Procedure: VIDEO BRONCHOSCOPY WITH ENDOBRONCHIAL NAVIGATION WITH BIOPSIES OF LEFT AND RIGHT UPPER LOBES;  Surgeon: Grace Isaac, MD;  Location: [facility];  Service: Thoracic;  Laterality: N/A;     VIDEO BRONCHOSCOPY WITH ENDOBRONCHIAL NAVIGATION N/A 06/11/2018   Procedure: VIDEO BRONCHOSCOPY WITH ENDOBRONCHIAL NAVIGATION;  Surgeon: Grace Isaac, MD;  Location: [facility];  Service: Thoracic;  Laterality: N/A;    Family History  Problem Relation Age of Onset   Heart attack Father 23       BYPASS   Crohn's disease Sister    Colon cancer Paternal Uncle    Healthy  Sister    Patient's daughter had a history of thyroid cancer status post thyroidectomy  Social History   Tobacco Use  Smoking Status Former Smoker   Types: Cigarettes   Last attempt to quit: 02/01/1967   Years since quitting: 51.4  Smokeless Tobacco Never Used    Social History   Substance and Sexual Activity  Alcohol Use Yes   Comment: a drink before dinner a few times a week     Allergies  Allergen Reactions   Gemfibrozil Other (See Comments)    Muscle pain   Niaspan [Niacin Er] Rash and Other (See Comments)    Flushing - aspirin did not mitigate    Simvastatin Other (See Comments)    Drug-Drug interaction with amlodipine    Current Outpatient Medications  Medication Sig Dispense Refill   acetaminophen (TYLENOL) 325 MG tablet Take 650 mg by mouth every 6 (six) hours as needed for moderate pain or headache.      amLODipine (NORVASC) 10 MG tablet Take 10 mg by mouth daily.     aspirin EC 81 MG tablet Take 81 mg by mouth every evening.      ibuprofen (ADVIL,MOTRIN) 200 MG tablet Take 400 mg by mouth every 8 (eight) hours as needed for headache or moderate pain.      isosorbide mononitrate (IMDUR) 60 MG 24 hr tablet Take 1 tablet (60 mg total) by mouth daily. 90 tablet 3   levothyroxine (SYNTHROID) 137 MCG tablet Take 137 mcg by mouth daily before breakfast.      losartan (COZAAR) 100 MG tablet Take 100 mg by mouth daily.     nitroGLYCERIN (NITROSTAT) 0.4 MG SL tablet Place 1 tablet (0.4 mg total) under the tongue every 5 (five) minutes as needed for chest pain. 25 tablet 12   omega-3  acid ethyl esters (LOVAZA) 1 g capsule Take 2 g by mouth 2 (two) times daily.     ondansetron (ZOFRAN) 4 MG tablet Take 1 tablet (4 mg total) by mouth every 6 (six) hours as needed for nausea or vomiting. 20 tablet 0   pantoprazole (PROTONIX) 40 MG tablet TAKE 1 TABLET(40 MG) BY MOUTH DAILY (Patient taking differently: Take 40 mg by mouth daily. ) 90 tablet 3   propranolol (INDERAL) 20 MG tablet Take 20 mg by mouth daily.  2   rosuvastatin (CRESTOR) 20 MG tablet TAKE 1 TABLET(20 MG) BY MOUTH DAILY (Patient taking differently: Take 20 mg by mouth daily. ) 90 tablet 3   traMADol (ULTRAM) 50 MG tablet Take 1-2 tablets (50-100 mg total) by mouth every 6 (six) hours as needed. 15 tablet 0   traZODone (DESYREL) 100 MG tablet Take 100 mg by mouth at bedtime as needed for sleep.     zolpidem (AMBIEN) 10 MG tablet Take 10 mg by mouth at bedtime as needed for sleep.   0   No current facility-administered medications for this visit.     Pertinent items are noted in HPI.     PHYSICAL EXAMINATION: BP 136/73    Pulse 69    Temp 97.8 F (36.6 C) (Skin)    Resp 20    Ht 5\' 11"  (1.803 m)    Wt 173 lb (78.5 kg)    SpO2 96% Comment: RA   BMI 24.13 kg/m  General appearance: alert, cooperative and appears stated age Head: Normocephalic, without obvious abnormality, atraumatic Neck: no adenopathy, no carotid bruit, no JVD, supple, symmetrical, trachea midline and thyroid not enlarged, symmetric, no tenderness/mass/nodules Lymph  nodes: Cervical, supraclavicular, and axillary nodes normal. Resp: clear to auscultation bilaterally Cardio: regular rate and rhythm, S1, S2 normal, no murmur, click, rub or gallop GI: soft, non-tender; bowel sounds normal; no masses,  no organomegaly Extremities: extremities normal, atraumatic, no cyanosis or edema and Homans sign is negative, no sign of DVT Neurologic: Grossly normal  Diagnostic Studies & Laboratory data:     Recent Radiology Findings:  Ct Chest Wo  Contrast  Result Date: 05/28/2018 CLINICAL DATA:  Left chest pain.  Follow up lung nodule. EXAM: CT CHEST WITHOUT CONTRAST TECHNIQUE: Multidetector CT imaging of the chest was performed following the standard protocol without IV contrast. COMPARISON:  Multiple exams, including 02/26/2018 FINDINGS: Cardiovascular: Atherosclerotic calcification of the aortic arch, left anterior descending, right left circumflex coronary arteries. Mediastinum/Nodes: Prior thyroidectomy. No pathologic adenopathy observed. Lungs/Pleura: Biapical pleuroparenchymal scarring. Right upper lobe ground-glass density pulmonary nodule 3.0 cm in long axis, previously the same on oldest available comparison of 10/23/2017 by my measurements. Peribronchovascular nodules in the left upper lobe are present with a solid nodule measuring 1.3 by 0.9 cm on image 42/3 (formerly the same on 10/23/2017) and ground-glass density components including a 1.1 cm component on image 31/3 and a 1.3 by 0.5 cm component on image 32/3, these likewise appear stable from the earliest available comparison of 10/23/2017. A 5 mm sub solid nodule in the left upper lobe on image 53/3 is stable. Lingular scarring, stable. Back on 10/23/2017 there was a 10 mm solid-appearing right lower lobe pulmonary nodule; in this vicinity on today's exam there is only some faint scarring on image 111/3, that nodule is otherwise resolved. Upper Abdomen: The thoracoabdominal junction, a right paraspinal soft tissue density measuring 2.4 by 1.7 cm has some faint calcification along its anterior margin. This is immediately adjacent to the right side of the L1 vertebral body and may slightly scallop the vertebral body margin although there is intact cortex. This lesion measured the same on 10/23/2017, and only head low-grade metabolic activity on prior PET-CT. Abdominal aortic atherosclerotic calcification is present. Musculoskeletal: Unremarkable IMPRESSION: 1. Bilateral upper lobe  ground-glass density pulmonary nodules, with a peribronchovascular solid nodular component in the left upper lobe. These nodules are stable compared to the earliest available comparison of 10/23/2017. Low-grade adenocarcinoma not excluded for the ground-glass density nodules, and the solid component of the left upper lobe nodule is nonspecific but bronchial carcinoid tumor not excluded. We have not demonstrated 7 months of stability. Continued surveillance is recommended. 2. In the right paraspinal space at L1 there is 2.4 by 1.7 cm soft tissue density oval-shaped nodule with faint anterior calcification which has low-grade activity but not overtly hypermetabolic activity at PET-CT. Stable since earliest available comparison of 10/23/2017. This is probably a benign lesion such as schwannoma or neurofibroma, but surveillance of this lesion is also suggested. 3.  Aortic Atherosclerosis (ICD10-I70.0).  Coronary atherosclerosis. 4. Prior thyroidectomy. Electronically Signed   By: Van Clines M.D.   On: 05/28/2018 16:02    I have independently reviewed the above radiology studies  and reviewed the findings with the patient.     Dg Chest 2 View  Result Date: 10/14/2017 CLINICAL DATA:  LEFT axillary chest pain for 4-6 weeks, no known injury, history hypertension, former smoker, coronary artery disease post stenting EXAM: CHEST - 2 VIEW COMPARISON:  None FINDINGS: Normal heart size, mediastinal contours, and pulmonary vascularity. Coronary stent noted. Atherosclerotic calcification aorta. Minimal subsegmental atelectasis LEFT base. 10 mm nodular density lower lateral RIGHT  lung, doubt nipple shadow from inferior location. Remaining lungs clear. No infiltrate, pleural effusion or pneumothorax. No acute osseous findings. IMPRESSION: Minimal LEFT basilar atelectasis with a 10 mm nonspecific nodular density at the lateral RIGHT lung base; pulmonary nodule not excluded and further assessment by CT chest  recommended to exclude pulmonary nodule/tumor. These results will be called to the ordering clinician or representative by the Radiologist Assistant, and communication documented in the PACS or zVision Dashboard. Electronically Signed   By: Lavonia Dana M.D.   On: 10/14/2017 14:22   Ct Chest Wo Contrast  Result Date: 10/24/2017 CLINICAL DATA:  Evaluate pulmonary nodule. Abnormal chest radiograph. EXAM: CT CHEST WITHOUT CONTRAST TECHNIQUE: Multidetector CT imaging of the chest was performed following the standard protocol without IV contrast. COMPARISON:  None FINDINGS: Cardiovascular: The heart size appears mildly enlarged. Aortic atherosclerosis. Calcification in the LAD left circumflex and RCA coronary artery noted. Mediastinum/Nodes: No enlarged mediastinal or axillary lymph nodes. The trachea appears patent and is midline. Normal appearance of the esophagus. Right lobe of thyroid gland nodule measures 1.7 cm, image 18/2. Lungs/Pleura: Bilateral mixed attenuation pulmonary nodules identified. Within the right upper lobe there is a pure ground-glass attenuating nodule which measures 2.7 cm, image 38/3. Solid-appearing nodule within the right lower lobe measures 1 cm, image 110/3. Ground-glass attenuating nodule within the left upper lobe measures 5 mm, image 55/3. Within the left upper lobe there is a mixed ground-glass and solid attenuating nodule. This measures 2 cm with a central solid component of 1.4 cm, image 61/5. Several adjacent areas of ground-glass nodularity are identified including a 1 cm left apical nodule, image 33/3. Upper Abdomen: No acute abnormality. Musculoskeletal: No chest wall mass or suspicious bone lesions identified. IMPRESSION: 1. Bilateral ground-glass, solid, and part solid pulmonary nodules are identified. Within the left upper lobe there is a part solid nodule measuring 2 cm with a 1.4 cm solid component. In the right lower lobe there is a solid subpleural nodule measuring 1 cm.  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 Thoracic surgery consultation is recommended. 2.  Aortic Atherosclerosis (ICD10-I70.0). 3. Multi vessel coronary artery atherosclerotic calcification 4. Thyroid nodule measuring 1.7 cm noted. Consider further evaluation with thyroid ultrasound. If patient is clinically hyperthyroid, consider nuclear medicine thyroid uptake and scan. Electronically Signed   By: Kerby Moors M.D.   On: 10/24/2017 11:31    Recent Lab Findings: Lab Results  Component Value Date   WBC 5.6 06/09/2018   HGB 15.3 06/09/2018   HCT 46.5 06/09/2018   PLT 174 06/09/2018   GLUCOSE 119 (H) 06/09/2018   CHOL 151 01/21/2017   TRIG 177 (H) 01/21/2017   HDL 48 01/21/2017   LDLCALC 68 01/21/2017   ALT 16 06/09/2018   AST 25 06/09/2018   NA 140 06/09/2018   K 3.7 06/09/2018   CL 102 06/09/2018   CREATININE 0.84 06/09/2018   BUN 12 06/09/2018   CO2 29 06/09/2018   TSH 10.736 (H) 01/24/2018   INR 1.0 06/09/2018   HGBA1C 5.9 (H) 06/09/2018   Cath February 2018, most recent  Acute Mrg lesion, 90 %stenosed.  Mid RCA lesion, 20 %stenosed.  Dist RCA lesion, 30 %stenosed.  Prox Cx to Mid Cx lesion, 20 %stenosed.  1st Diag lesion, 20 %stenosed.  Dist LAD lesion, 20 %stenosed.  A STENT SYNERGY DES [id number] drug eluting stent was successfully placed.  Prox RCA to Mid RCA lesion, 80 %stenosed.  Post intervention, there is a 0% residual stenosis.  1. Severe single vessel CAD (severe stenosis mid RCA) 2. Successful PTCA/DES x 1 mid RCA  3. Mild non-obstructive disease Circumflex and LAD  Recommendations: Will continue DAPT with ASA and Plavix for one year. If he needs to interrupt anti-platelet therapy for a toe surgery, this could be considered at 3 months since a Synergy DES was placed, although it would be optimal to continue for at least 6 months. Will discharge home today as part of the same day PCI protocol. Will changed Prilosec to Protonix 40 mg daily. He  will be started on Plavix 75 mg daily. Follow up with Dr. Marlou Porch or office APP in 1 week.   PFT's 3.16 93% 33.61 99% The FVC, FEV1, FEV1/FVC ratio and FEF25-75% are within normal limits, but there is curvature to the flow volume loop suggesting minimal small airway disease. The airway resistance is normal. Lung volumes are within normal limits. Following administration of bronchodilators, there is no significant response. The diffusing capacity is normal. However, the diffusing capacity was not corrected for the patient's hemoglobin. Conclusions: Minimal airway obstruction is present suggesting small airway disease. 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 I have again discussed with the patient proceeding with resection of left upper lesion, he did not wish to immediately proceed with lung resection but was willing to repeat a navigation bronchoscopy with specific attention to the left upper lobe lesion. 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 I have recommended to the patient that we proceed with left upper lobectomy. Risks and options discussed. Expectations of surgery discussed.  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

## 2018-06-16 ENCOUNTER — Other Ambulatory Visit: Payer: Self-pay | Admitting: *Deleted

## 2018-06-16 ENCOUNTER — Encounter: Payer: Self-pay | Admitting: *Deleted

## 2018-06-16 ENCOUNTER — Ambulatory Visit: Payer: Medicare Other | Admitting: Cardiothoracic Surgery

## 2018-06-16 DIAGNOSIS — R911 Solitary pulmonary nodule: Secondary | ICD-10-CM

## 2018-06-19 ENCOUNTER — Other Ambulatory Visit: Payer: Self-pay | Admitting: *Deleted

## 2018-06-19 ENCOUNTER — Encounter: Payer: Self-pay | Admitting: Cardiothoracic Surgery

## 2018-06-19 NOTE — Pre-Procedure Instructions (Signed)
[id number] [address], [address] AT [facility] OF [address] [place] [address] Phone: [phone] Fax: [phone]      Your procedure is scheduled on  Tuesday 06-24-18 from 0730-1220  Report to [facility] Main Entrance "A" at 0530 A.M., and check in at the Admitting office.  Call this number if you have problems the morning of surgery:  [phone]  Call [phone] if you have any questions prior to your surgery date Monday-Friday 8am-4pm    Remember:  Do not eat or drink after midnight.   Take these medicines the morning of surgery with A SIP OF WATER : amLODipine (NORVASC) isosorbide mononitrate (IMDUR) levothyroxine (SYNTHROID) propranolol (INDERAL) rosuvastatin (CRESTOR) pantoprazole (PROTONIX) as needed acetaminophen (TYLENOL) as needed nitroGLYCERIN (NITROSTAT) as needed ondansetron (ZOFRAN) as needed traMADol (ULTRAM as needed   Follow your surgeon's instructions on when to stop Aspirin.  If no instructions were given by your surgeon then you will need to call the office to get those instructions.    As of today STOP taking any Aspirin (unless otherwise instructed by your surgeon), Aleve, Naproxen, Ibuprofen, Motrin, Advil, Goody's, BC's, all herbal medications, fish oil, and all vitamins.    The Morning of Surgery  Do not wear jewelry..  Do not wear lotions, powders, colognes, or deodorant             Men may shave face and neck.  Do not bring valuables to the hospital.  [facility] is not responsible for any belongings or valuables.  If you are a smoker, DO NOT Smoke 24 hours prior to surgery IF you wear a CPAP at night please bring your mask, tubing, and machine the morning of surgery   Remember that you must have someone to transport you home after your surgery, and remain with you for 24 hours if you are discharged the same day.   Contacts, glasses, hearing aids, dentures or bridgework may  not be worn into surgery.    Leave your suitcase in the car.  After surgery it may be brought to your room.  For patients admitted to the hospital, discharge time will be determined by your treatment team.  Patients discharged the day of surgery will not be allowed to drive home.    Special instructions:   [facility]- Preparing For Surgery  Before surgery, you can play an important role. Because skin is not sterile, your skin needs to be as free of germs as possible. You can reduce the number of germs on your skin by washing with CHG (chlorahexidine gluconate) Soap before surgery.  CHG is an antiseptic cleaner which kills germs and bonds with the skin to continue killing germs even after washing.    Oral Hygiene is also important to reduce your risk of infection.  Remember - BRUSH YOUR TEETH THE MORNING OF SURGERY WITH YOUR REGULAR TOOTHPASTE  Please do not use if you have an allergy to CHG or antibacterial soaps. If your skin becomes reddened/irritated stop using the CHG.  Do not shave (including legs and underarms) for at least 48 hours prior to first CHG shower. It is OK to shave your face.  Please follow these instructions carefully.   1. Shower the NIGHT BEFORE SURGERY and the MORNING OF SURGERY with CHG Soap.   2. If you chose to wash your hair, wash your hair first as usual with your normal shampoo.  3. After you shampoo, rinse  your hair and body thoroughly to remove the shampoo.  4. Use CHG as you would any other liquid soap. You can apply CHG directly to the skin and wash gently with a scrungie or a clean washcloth.   5. Apply the CHG Soap to your body ONLY FROM THE NECK DOWN.  Do not use on open wounds or open sores. Avoid contact with your eyes, ears, mouth and genitals (private parts). Wash Face and genitals (private parts)  with your normal soap.   6. Wash thoroughly, paying special attention to the area where your surgery will be performed.  7. Thoroughly rinse your  body with warm water from the neck down.  8. DO NOT shower/wash with your normal soap after using and rinsing off the CHG Soap.  9. Pat yourself dry with a CLEAN TOWEL.  10. Wear CLEAN PAJAMAS to bed the night before surgery, wear comfortable clothes the morning of surgery  11. Place CLEAN SHEETS on your bed the night of your first shower and DO NOT SLEEP WITH PETS.    Day of Surgery:  Do not apply any deodorants/lotions.  Please wear clean clothes to the hospital/surgery center.   Remember to brush your teeth WITH YOUR REGULAR TOOTHPASTE.   Please read over the following fact sheets that you were given.

## 2018-06-19 NOTE — Progress Notes (Signed)
The proposed treatment discussed in cancer conference 06/19/2018 is for discussion purpose only and is not a binding recommendation.  The patient was not physically examined nor present for their treatment options.  Therefore, final treatment plans cannot be decided.

## 2018-06-20 ENCOUNTER — Ambulatory Visit (HOSPITAL_COMMUNITY)
Admission: RE | Admit: 2018-06-20 | Discharge: 2018-06-20 | Disposition: A | Discharge status: Home / Self Care | Payer: Medicare Other | Source: Ambulatory Visit | Attending: Cardiothoracic Surgery | Admitting: Cardiothoracic Surgery

## 2018-06-20 ENCOUNTER — Encounter (HOSPITAL_COMMUNITY): Payer: Self-pay

## 2018-06-20 ENCOUNTER — Encounter (HOSPITAL_COMMUNITY)
Admission: RE | Admit: 2018-06-20 | Discharge: 2018-06-20 | Disposition: A | Discharge status: Home / Self Care | Payer: Medicare Other | Source: Ambulatory Visit | Attending: Cardiothoracic Surgery | Admitting: Cardiothoracic Surgery

## 2018-06-20 ENCOUNTER — Other Ambulatory Visit (HOSPITAL_COMMUNITY)
Admission: RE | Admit: 2018-06-20 | Discharge: 2018-06-20 | Disposition: A | Discharge status: Home / Self Care | Payer: Medicare Other | Source: Ambulatory Visit | Attending: Cardiothoracic Surgery | Admitting: Cardiothoracic Surgery

## 2018-06-20 ENCOUNTER — Other Ambulatory Visit: Payer: Self-pay

## 2018-06-20 DIAGNOSIS — Z01818 Encounter for other preprocedural examination: Secondary | ICD-10-CM | POA: Insufficient documentation

## 2018-06-20 DIAGNOSIS — E785 Hyperlipidemia, unspecified: Secondary | ICD-10-CM | POA: Diagnosis not present

## 2018-06-20 DIAGNOSIS — Z87891 Personal history of nicotine dependence: Secondary | ICD-10-CM | POA: Diagnosis not present

## 2018-06-20 DIAGNOSIS — I2510 Atherosclerotic heart disease of native coronary artery without angina pectoris: Secondary | ICD-10-CM | POA: Diagnosis not present

## 2018-06-20 DIAGNOSIS — I10 Essential (primary) hypertension: Secondary | ICD-10-CM | POA: Diagnosis not present

## 2018-06-20 DIAGNOSIS — R911 Solitary pulmonary nodule: Secondary | ICD-10-CM | POA: Insufficient documentation

## 2018-06-20 DIAGNOSIS — Z1159 Encounter for screening for other viral diseases: Secondary | ICD-10-CM | POA: Diagnosis not present

## 2018-06-20 DIAGNOSIS — Z01811 Encounter for preprocedural respiratory examination: Secondary | ICD-10-CM | POA: Diagnosis not present

## 2018-06-20 HISTORY — DX: Angina pectoris, unspecified: I20.9

## 2018-06-20 LAB — BLOOD GAS, ARTERIAL
Acid-Base Excess: 1.7 mmol/L (ref 0.0–2.0)
Bicarbonate: 25.6 mmol/L (ref 20.0–28.0)
Drawn by: 421801
FIO2: 21
O2 Saturation: 97.1 %
Patient temperature: 98.6
pCO2 arterial: 38.6 mmHg (ref 32.0–48.0)
pH, Arterial: 7.436 (ref 7.350–7.450)
pO2, Arterial: 89.8 mmHg (ref 83.0–108.0)

## 2018-06-20 LAB — CBC
HCT: 45.4 % (ref 39.0–52.0)
Hemoglobin: 15.1 g/dL (ref 13.0–17.0)
MCH: 30.1 pg (ref 26.0–34.0)
MCHC: 33.3 g/dL (ref 30.0–36.0)
MCV: 90.4 fL (ref 80.0–100.0)
Platelets: 171 10*3/uL (ref 150–400)
RBC: 5.02 MIL/uL (ref 4.22–5.81)
RDW: 12 % (ref 11.5–15.5)
WBC: 6 10*3/uL (ref 4.0–10.5)
nRBC: 0 % (ref 0.0–0.2)

## 2018-06-20 LAB — COMPREHENSIVE METABOLIC PANEL
ALT: 20 U/L (ref 0–44)
AST: 25 U/L (ref 15–41)
Albumin: 4.3 g/dL (ref 3.5–5.0)
Alkaline Phosphatase: 60 U/L (ref 38–126)
Anion gap: 10 (ref 5–15)
BUN: 16 mg/dL (ref 8–23)
CO2: 23 mmol/L (ref 22–32)
Calcium: 8.7 mg/dL — ABNORMAL LOW (ref 8.9–10.3)
Chloride: 106 mmol/L (ref 98–111)
Creatinine, Ser: 0.66 mg/dL (ref 0.61–1.24)
GFR calc Af Amer: >60 mL/min (ref >60)
GFR calc non Af Amer: >60 mL/min (ref >60)
Glucose, Bld: 150 mg/dL — ABNORMAL HIGH (ref 70–99)
Potassium: 4 mmol/L (ref 3.5–5.1)
Sodium: 139 mmol/L (ref 135–145)
Total Bilirubin: 0.9 mg/dL (ref 0.3–1.2)
Total Protein: 6.4 g/dL — ABNORMAL LOW (ref 6.5–8.1)

## 2018-06-20 LAB — URINALYSIS, ROUTINE W REFLEX MICROSCOPIC
Bilirubin Urine: NEGATIVE
Glucose, UA: NEGATIVE mg/dL
Hgb urine dipstick: NEGATIVE
Ketones, ur: NEGATIVE mg/dL
Leukocytes,Ua: NEGATIVE
Nitrite: NEGATIVE
Protein, ur: NEGATIVE mg/dL
Specific Gravity, Urine: 1.01 (ref 1.005–1.030)
pH: 7 (ref 5.0–8.0)

## 2018-06-20 LAB — PROTIME-INR
INR: 1.1 (ref 0.8–1.2)
Prothrombin Time: 13.7 seconds (ref 11.4–15.2)

## 2018-06-20 LAB — APTT: aPTT: 29 seconds (ref 24–36)

## 2018-06-20 LAB — TYPE AND SCREEN
ABO/RH(D): A POS
Antibody Screen: NEGATIVE

## 2018-06-20 LAB — SURGICAL PCR SCREEN
MRSA, PCR: NEGATIVE
Staphylococcus aureus: NEGATIVE

## 2018-06-20 LAB — ABO/RH: ABO/RH(D): A POS

## 2018-06-20 NOTE — Progress Notes (Signed)
  Coronavirus Screening  Pt scheduled for COVID test at [facility]- today Have you experienced the following symptoms:  Cough yes/no: No Fever (>100.31F)  yes/no: No Runny nose yes/no: No Sore throat yes/no: No Difficulty breathing/shortness of breath  yes/no: No Loss of sense of smell or taste:No Have you or a family member traveled in the last 14 days and where? yes/no: No  PCP - Dr [name]  Cardiologist - Dr Candee Furbish  Endocrinologist-  Dr Dagmar Hait  Chest x-ray - 06-20-18 (Epic)  EKG - 06-20-18 (Epic)  Stress Test - 10-02-17 (Epic)  ECHO - 02-08-16(Epic)  Cardiac Cath - 02-20-16(Epic)  AICD-denies PM-denies LOOP-denies  Sleep Study - No CPAP - NA  LABS-CBC,CMP,ABG,PT-INR,APTT,UA,T/S, PCR  ASA-Pt instructed to contact Surgeon's office for further instructions.  ERAS-NA  HA1C-NA. Is a pre-diabetic.  Fasting Blood Sugar -  Checks Blood Sugar  Twice a yr at [employer] office.  Anesthesia-Y. Cardiac history  Pt denies having chest pain, sob, or fever at this time. All instructions explained to the pt, with a verbal understanding of the material. Pt agrees to go over the instructions while at home for a better understanding. The opportunity to ask questions was provided.

## 2018-06-21 LAB — NOVEL CORONAVIRUS, NAA (HOSP ORDER, SEND-OUT TO REF LAB; TAT 18-24 HRS): SARS-CoV-2, NAA: NOT DETECTED

## 2018-06-23 ENCOUNTER — Encounter (HOSPITAL_COMMUNITY): Payer: Self-pay | Admitting: Anesthesiology

## 2018-06-23 NOTE — Anesthesia Preprocedure Evaluation (Addendum)
Anesthesia Evaluation  Patient identified by MRN, date of birth, ID band Patient awake    Reviewed: Allergy & Precautions, NPO status , Patient's Chart, lab work & pertinent test results  History of Anesthesia Complications (+) PONV  Airway Mallampati: II  TM Distance: >3 FB Neck ROM: Full    Dental  (+) Teeth Intact, Dental Advisory Given   Pulmonary former smoker,    breath sounds clear to auscultation       Cardiovascular hypertension, Pt. on medications + CAD and + Cardiac Stents   Rhythm:Regular Rate:Normal     Neuro/Psych negative neurological ROS  negative psych ROS   GI/Hepatic Neg liver ROS, GERD  Medicated,  Endo/Other  negative endocrine ROS  Renal/GU negative Renal ROS     Musculoskeletal  (+) Arthritis ,   Abdominal Normal abdominal exam  (+)   Peds  Hematology negative hematology ROS (+)   Anesthesia Other Findings   Reproductive/Obstetrics                          Myocardial Perfusion:  Nuclear stress EF: 55%.  Blood pressure demonstrated a normal response to exercise.  There was no ST segment deviation noted during stress.  Defect 1: There is a small defect of mild severity present in the apical inferior and apical lateral location.  Findings consistent with mild ischemia.  The left ventricular ejection fraction is normal (55-65%).     Anesthesia Physical Anesthesia Plan  ASA: III  Anesthesia Plan: General   Post-op Pain Management:    Induction: Intravenous  PONV Risk Score and Plan: 4 or greater and Ondansetron, Dexamethasone and Treatment may vary due to age or medical condition  Airway Management Planned: Double Lumen EBT  Additional Equipment: Arterial line, CVP and Ultrasound Guidance Line Placement  Intra-op Plan:   Post-operative Plan: Extubation in OR  Informed Consent: I have reviewed the patients History and Physical, chart, labs and  discussed the procedure including the risks, benefits and alternatives for the proposed anesthesia with the patient or authorized representative who has indicated his/her understanding and acceptance.     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  COVID-19 Labs  No results for input(s): DDIMER, FERRITIN, LDH, CRP in the last 72 hours.  Lab Results      Component                Value               Date                      SARSCOV2NAA              NOT DETECTED        06/20/2018                SARSCOV2NAA              NOT DETECTED        06/09/2018            )     Anesthesia Quick Evaluation

## 2018-06-24 ENCOUNTER — Inpatient Hospital Stay (HOSPITAL_COMMUNITY)
Admission: RE | Admit: 2018-06-24 | Discharge: 2018-06-30 | DRG: 164 | Disposition: A | Discharge status: Home / Self Care | Payer: Medicare Other | Attending: Cardiothoracic Surgery | Admitting: Cardiothoracic Surgery

## 2018-06-24 ENCOUNTER — Encounter (HOSPITAL_COMMUNITY)
Admission: RE | Disposition: A | Discharge status: Home / Self Care | Payer: Self-pay | Source: Home / Self Care | Attending: Cardiothoracic Surgery

## 2018-06-24 ENCOUNTER — Inpatient Hospital Stay (HOSPITAL_COMMUNITY): Payer: Medicare Other | Admitting: Registered Nurse

## 2018-06-24 ENCOUNTER — Other Ambulatory Visit: Payer: Self-pay

## 2018-06-24 ENCOUNTER — Inpatient Hospital Stay (HOSPITAL_COMMUNITY): Payer: Medicare Other | Admitting: Vascular Surgery

## 2018-06-24 ENCOUNTER — Inpatient Hospital Stay (HOSPITAL_COMMUNITY): Payer: Medicare Other

## 2018-06-24 ENCOUNTER — Encounter (HOSPITAL_COMMUNITY): Payer: Self-pay

## 2018-06-24 DIAGNOSIS — E785 Hyperlipidemia, unspecified: Secondary | ICD-10-CM | POA: Diagnosis present

## 2018-06-24 DIAGNOSIS — Z79899 Other long term (current) drug therapy: Secondary | ICD-10-CM | POA: Diagnosis not present

## 2018-06-24 DIAGNOSIS — E876 Hypokalemia: Secondary | ICD-10-CM | POA: Diagnosis present

## 2018-06-24 DIAGNOSIS — Z8249 Family history of ischemic heart disease and other diseases of the circulatory system: Secondary | ICD-10-CM

## 2018-06-24 DIAGNOSIS — F419 Anxiety disorder, unspecified: Secondary | ICD-10-CM | POA: Diagnosis present

## 2018-06-24 DIAGNOSIS — I517 Cardiomegaly: Secondary | ICD-10-CM | POA: Diagnosis not present

## 2018-06-24 DIAGNOSIS — I2510 Atherosclerotic heart disease of native coronary artery without angina pectoris: Secondary | ICD-10-CM | POA: Diagnosis present

## 2018-06-24 DIAGNOSIS — Z9689 Presence of other specified functional implants: Secondary | ICD-10-CM

## 2018-06-24 DIAGNOSIS — K5900 Constipation, unspecified: Secondary | ICD-10-CM | POA: Diagnosis not present

## 2018-06-24 DIAGNOSIS — D62 Acute posthemorrhagic anemia: Secondary | ICD-10-CM | POA: Diagnosis not present

## 2018-06-24 DIAGNOSIS — K219 Gastro-esophageal reflux disease without esophagitis: Secondary | ICD-10-CM | POA: Diagnosis present

## 2018-06-24 DIAGNOSIS — J9382 Other air leak: Secondary | ICD-10-CM | POA: Diagnosis not present

## 2018-06-24 DIAGNOSIS — J60 Coalworker's pneumoconiosis: Secondary | ICD-10-CM | POA: Diagnosis not present

## 2018-06-24 DIAGNOSIS — I10 Essential (primary) hypertension: Secondary | ICD-10-CM | POA: Diagnosis not present

## 2018-06-24 DIAGNOSIS — Z09 Encounter for follow-up examination after completed treatment for conditions other than malignant neoplasm: Secondary | ICD-10-CM

## 2018-06-24 DIAGNOSIS — Z955 Presence of coronary angioplasty implant and graft: Secondary | ICD-10-CM

## 2018-06-24 DIAGNOSIS — J939 Pneumothorax, unspecified: Secondary | ICD-10-CM | POA: Diagnosis not present

## 2018-06-24 DIAGNOSIS — Z48813 Encounter for surgical aftercare following surgery on the respiratory system: Secondary | ICD-10-CM | POA: Diagnosis not present

## 2018-06-24 DIAGNOSIS — C3412 Malignant neoplasm of upper lobe, left bronchus or lung: Secondary | ICD-10-CM | POA: Diagnosis not present

## 2018-06-24 DIAGNOSIS — J9811 Atelectasis: Secondary | ICD-10-CM | POA: Diagnosis not present

## 2018-06-24 DIAGNOSIS — Z85850 Personal history of malignant neoplasm of thyroid: Secondary | ICD-10-CM | POA: Diagnosis not present

## 2018-06-24 DIAGNOSIS — Z800 Family history of malignant neoplasm of digestive organs: Secondary | ICD-10-CM | POA: Diagnosis not present

## 2018-06-24 DIAGNOSIS — R911 Solitary pulmonary nodule: Secondary | ICD-10-CM | POA: Diagnosis present

## 2018-06-24 DIAGNOSIS — Z87891 Personal history of nicotine dependence: Secondary | ICD-10-CM | POA: Diagnosis not present

## 2018-06-24 DIAGNOSIS — Z4682 Encounter for fitting and adjustment of non-vascular catheter: Secondary | ICD-10-CM | POA: Diagnosis not present

## 2018-06-24 DIAGNOSIS — I700 Atherosclerosis of aorta: Secondary | ICD-10-CM | POA: Diagnosis not present

## 2018-06-24 HISTORY — PX: LUNG REMOVAL, PARTIAL: SHX233

## 2018-06-24 HISTORY — PX: VIDEO ASSISTED THORACOSCOPY (VATS)/WEDGE RESECTION: SHX6174

## 2018-06-24 LAB — GLUCOSE, CAPILLARY
Glucose-Capillary: 137 mg/dL — ABNORMAL HIGH (ref 70–99)
Glucose-Capillary: 141 mg/dL — ABNORMAL HIGH (ref 70–99)
Glucose-Capillary: 150 mg/dL — ABNORMAL HIGH (ref 70–99)
Glucose-Capillary: 165 mg/dL — ABNORMAL HIGH (ref 70–99)

## 2018-06-24 SURGERY — VIDEO ASSISTED THORACOSCOPY (VATS)/WEDGE RESECTION
Anesthesia: General | Site: Chest

## 2018-06-24 MED ORDER — GLYCOPYRROLATE 0.2 MG/ML IJ SOLN
INTRAMUSCULAR | Status: DC | PRN
  Administered 2018-06-24: 0.2 mg via INTRAVENOUS

## 2018-06-24 MED ORDER — FENTANYL CITRATE (PF) 100 MCG/2ML IJ SOLN
25.0000 ug | INTRAMUSCULAR | Status: DC | PRN
  Administered 2018-06-24: 25 ug via INTRAVENOUS

## 2018-06-24 MED ORDER — CEFAZOLIN SODIUM-DEXTROSE 2-4 GM/100ML-% IV SOLN
2.0000 g | Freq: Three times a day (TID) | INTRAVENOUS | Status: AC
  Administered 2018-06-24 (×2): 2 g via INTRAVENOUS
  Filled 2018-06-24 (×2): qty 100

## 2018-06-24 MED ORDER — FENTANYL CITRATE (PF) 100 MCG/2ML IJ SOLN
INTRAMUSCULAR | Status: AC
  Filled 2018-06-24: qty 2

## 2018-06-24 MED ORDER — DIPHENHYDRAMINE HCL 50 MG/ML IJ SOLN: 12.5000 mg | Freq: Four times a day (QID) | INTRAMUSCULAR | Status: DC | PRN

## 2018-06-24 MED ORDER — BUPIVACAINE LIPOSOME 1.3 % IJ SUSP
INTRAMUSCULAR | Status: DC | PRN
  Administered 2018-06-24: 09:00:00 50 mL

## 2018-06-24 MED ORDER — LEVOTHYROXINE SODIUM 25 MCG PO TABS
137.0000 ug | ORAL_TABLET | Freq: Every day | ORAL | Status: DC
  Administered 2018-06-25 – 2018-06-30 (×6): 137 ug via ORAL
  Filled 2018-06-24 (×6): qty 1

## 2018-06-24 MED ORDER — ONDANSETRON HCL 4 MG/2ML IJ SOLN
4.0000 mg | Freq: Four times a day (QID) | INTRAMUSCULAR | Status: DC | PRN
  Administered 2018-06-25 (×2): 4 mg via INTRAVENOUS

## 2018-06-24 MED ORDER — POTASSIUM CHLORIDE 10 MEQ/50ML IV SOLN: 10.0000 meq | Freq: Every day | INTRAVENOUS | Status: DC | PRN

## 2018-06-24 MED ORDER — 0.9 % SODIUM CHLORIDE (POUR BTL) OPTIME
TOPICAL | Status: DC | PRN
  Administered 2018-06-24: 2000 mL

## 2018-06-24 MED ORDER — SODIUM CHLORIDE 0.9% FLUSH: 9.0000 mL | INTRAVENOUS | Status: DC | PRN

## 2018-06-24 MED ORDER — ROCURONIUM BROMIDE 50 MG/5ML IV SOSY
PREFILLED_SYRINGE | INTRAVENOUS | Status: DC | PRN
  Administered 2018-06-24: 50 mg via INTRAVENOUS
  Administered 2018-06-24: 30 mg via INTRAVENOUS
  Administered 2018-06-24: 50 mg via INTRAVENOUS

## 2018-06-24 MED ORDER — ONDANSETRON HCL 4 MG/2ML IJ SOLN
4.0000 mg | Freq: Four times a day (QID) | INTRAMUSCULAR | Status: DC | PRN
  Filled 2018-06-24 (×2): qty 2

## 2018-06-24 MED ORDER — DEXTROSE-NACL 5-0.45 % IV SOLN
INTRAVENOUS | Status: DC
  Administered 2018-06-24 (×2): via INTRAVENOUS
  Administered 2018-06-25: 500 mL via INTRAVENOUS
  Administered 2018-06-25: 07:00:00 via INTRAVENOUS

## 2018-06-24 MED ORDER — SUGAMMADEX SODIUM 200 MG/2ML IV SOLN
INTRAVENOUS | Status: DC | PRN
  Administered 2018-06-24 (×2): 100 mg via INTRAVENOUS

## 2018-06-24 MED ORDER — PROPOFOL 10 MG/ML IV BOLUS
INTRAVENOUS | Status: AC
  Filled 2018-06-24: qty 20

## 2018-06-24 MED ORDER — NALOXONE HCL 0.4 MG/ML IJ SOLN: 0.4000 mg | INTRAMUSCULAR | Status: DC | PRN

## 2018-06-24 MED ORDER — LACTATED RINGERS IV SOLN
INTRAVENOUS | Status: DC | PRN
  Administered 2018-06-24: 07:00:00 via INTRAVENOUS

## 2018-06-24 MED ORDER — SODIUM CHLORIDE 0.9 % IV SOLN
INTRAVENOUS | Status: DC | PRN
  Administered 2018-06-24: 09:00:00 30 ug/min via INTRAVENOUS

## 2018-06-24 MED ORDER — ENOXAPARIN SODIUM 40 MG/0.4ML ~~LOC~~ SOLN
40.0000 mg | Freq: Every day | SUBCUTANEOUS | Status: DC
  Administered 2018-06-25 – 2018-06-29 (×5): 40 mg via SUBCUTANEOUS
  Filled 2018-06-24 (×5): qty 0.4

## 2018-06-24 MED ORDER — CEFAZOLIN SODIUM-DEXTROSE 2-4 GM/100ML-% IV SOLN
2.0000 g | INTRAVENOUS | Status: AC
  Administered 2018-06-24: 08:00:00 2 g via INTRAVENOUS
  Filled 2018-06-24: qty 100

## 2018-06-24 MED ORDER — SODIUM CHLORIDE (PF) 0.9 % IJ SOLN
INTRAMUSCULAR | Status: DC | PRN
  Administered 2018-06-24: 50 mL

## 2018-06-24 MED ORDER — SENNOSIDES-DOCUSATE SODIUM 8.6-50 MG PO TABS
1.0000 | ORAL_TABLET | Freq: Every day | ORAL | Status: DC
  Administered 2018-06-24 – 2018-06-29 (×4): 1 via ORAL
  Filled 2018-06-24 (×5): qty 1

## 2018-06-24 MED ORDER — ASPIRIN EC 81 MG PO TBEC
81.0000 mg | DELAYED_RELEASE_TABLET | Freq: Every evening | ORAL | Status: DC
  Administered 2018-06-25 – 2018-06-29 (×5): 81 mg via ORAL
  Filled 2018-06-24 (×5): qty 1

## 2018-06-24 MED ORDER — FENTANYL 40 MCG/ML IV SOLN
INTRAVENOUS | Status: DC
  Administered 2018-06-24: 255 ug via INTRAVENOUS
  Administered 2018-06-24: 1000 ug via INTRAVENOUS
  Administered 2018-06-25 (×2): 15 ug via INTRAVENOUS
  Administered 2018-06-25 (×3): 30 ug via INTRAVENOUS
  Administered 2018-06-26 (×2): 15 ug via INTRAVENOUS
  Administered 2018-06-26: 30 ug via INTRAVENOUS
  Filled 2018-06-24 (×4): qty 25
  Filled 2018-06-24: qty 1000
  Filled 2018-06-24 (×9): qty 25

## 2018-06-24 MED ORDER — MIDAZOLAM HCL 5 MG/5ML IJ SOLN
INTRAMUSCULAR | Status: DC | PRN
  Administered 2018-06-24 (×2): 1 mg via INTRAVENOUS

## 2018-06-24 MED ORDER — MIDAZOLAM HCL 2 MG/2ML IJ SOLN
INTRAMUSCULAR | Status: AC
  Filled 2018-06-24: qty 2

## 2018-06-24 MED ORDER — DIPHENHYDRAMINE HCL 12.5 MG/5ML PO ELIX
12.5000 mg | ORAL_SOLUTION | Freq: Four times a day (QID) | ORAL | Status: DC | PRN
  Filled 2018-06-24: qty 5

## 2018-06-24 MED ORDER — BISACODYL 5 MG PO TBEC
10.0000 mg | DELAYED_RELEASE_TABLET | Freq: Every day | ORAL | Status: DC
  Administered 2018-06-25 – 2018-06-29 (×5): 10 mg via ORAL
  Filled 2018-06-24 (×5): qty 2

## 2018-06-24 MED ORDER — PANTOPRAZOLE SODIUM 40 MG PO TBEC
40.0000 mg | DELAYED_RELEASE_TABLET | Freq: Every day | ORAL | Status: DC
  Administered 2018-06-25 – 2018-06-29 (×5): 40 mg via ORAL
  Filled 2018-06-24 (×5): qty 1

## 2018-06-24 MED ORDER — PROPOFOL 10 MG/ML IV BOLUS
INTRAVENOUS | Status: DC | PRN
  Administered 2018-06-24: 100 mg via INTRAVENOUS

## 2018-06-24 MED ORDER — SUCCINYLCHOLINE CHLORIDE 20 MG/ML IJ SOLN
INTRAMUSCULAR | Status: DC | PRN
  Administered 2018-06-24: 120 mg via INTRAVENOUS

## 2018-06-24 MED ORDER — ACETAMINOPHEN 160 MG/5ML PO SOLN: 1000.0000 mg | Freq: Four times a day (QID) | ORAL | Status: AC

## 2018-06-24 MED ORDER — DEXAMETHASONE SODIUM PHOSPHATE 10 MG/ML IJ SOLN
INTRAMUSCULAR | Status: DC | PRN
  Administered 2018-06-24: 5 mg via INTRAVENOUS

## 2018-06-24 MED ORDER — ROSUVASTATIN CALCIUM 20 MG PO TABS
20.0000 mg | ORAL_TABLET | Freq: Every day | ORAL | Status: DC
  Administered 2018-06-25 – 2018-06-29 (×5): 20 mg via ORAL
  Filled 2018-06-24 (×4): qty 1

## 2018-06-24 MED ORDER — FENTANYL CITRATE (PF) 250 MCG/5ML IJ SOLN
INTRAMUSCULAR | Status: AC
  Filled 2018-06-24: qty 5

## 2018-06-24 MED ORDER — INSULIN ASPART 100 UNIT/ML ~~LOC~~ SOLN
0.0000 [IU] | SUBCUTANEOUS | Status: DC
  Administered 2018-06-24 (×2): 2 [IU] via SUBCUTANEOUS
  Administered 2018-06-24: 4 [IU] via SUBCUTANEOUS
  Administered 2018-06-25 – 2018-06-28 (×10): 2 [IU] via SUBCUTANEOUS

## 2018-06-24 MED ORDER — FENTANYL CITRATE (PF) 100 MCG/2ML IJ SOLN
INTRAMUSCULAR | Status: DC | PRN
  Administered 2018-06-24: 100 ug via INTRAVENOUS
  Administered 2018-06-24 (×3): 50 ug via INTRAVENOUS

## 2018-06-24 MED ORDER — OXYCODONE HCL 5 MG PO TABS
5.0000 mg | ORAL_TABLET | ORAL | Status: DC | PRN
  Administered 2018-06-24: 10 mg via ORAL
  Administered 2018-06-24 – 2018-06-27 (×2): 5 mg via ORAL
  Administered 2018-06-27 – 2018-06-28 (×2): 10 mg via ORAL
  Filled 2018-06-24: qty 2
  Filled 2018-06-24: qty 1
  Filled 2018-06-24 (×4): qty 2

## 2018-06-24 MED ORDER — TRAMADOL HCL 50 MG PO TABS
50.0000 mg | ORAL_TABLET | Freq: Four times a day (QID) | ORAL | Status: DC | PRN
  Administered 2018-06-24 – 2018-06-25 (×2): 100 mg via ORAL
  Administered 2018-06-26 – 2018-06-27 (×3): 50 mg via ORAL
  Administered 2018-06-27 (×3): 100 mg via ORAL
  Administered 2018-06-28: 50 mg via ORAL
  Administered 2018-06-28 – 2018-06-29 (×4): 100 mg via ORAL
  Administered 2018-06-30: 50 mg via ORAL
  Filled 2018-06-24 (×3): qty 2
  Filled 2018-06-24: qty 1
  Filled 2018-06-24: qty 2
  Filled 2018-06-24 (×3): qty 1
  Filled 2018-06-24 (×7): qty 2

## 2018-06-24 MED ORDER — ACETAMINOPHEN 500 MG PO TABS
1000.0000 mg | ORAL_TABLET | Freq: Four times a day (QID) | ORAL | Status: AC
  Administered 2018-06-24 – 2018-06-29 (×18): 1000 mg via ORAL
  Filled 2018-06-24 (×19): qty 2

## 2018-06-24 MED ORDER — ORAL CARE MOUTH RINSE
15.0000 mL | Freq: Two times a day (BID) | OROMUCOSAL | Status: DC
  Administered 2018-06-24 – 2018-06-29 (×10): 15 mL via OROMUCOSAL

## 2018-06-24 MED ORDER — BUPIVACAINE HCL (PF) 0.5 % IJ SOLN
INTRAMUSCULAR | Status: AC
  Filled 2018-06-24: qty 30

## 2018-06-24 MED ORDER — ONDANSETRON HCL 4 MG/2ML IJ SOLN
INTRAMUSCULAR | Status: DC | PRN
  Administered 2018-06-24: 4 mg via INTRAVENOUS

## 2018-06-24 SURGICAL SUPPLY — 62 items
ADH SKN CLS APL DERMABOND .7 (GAUZE/BANDAGES/DRESSINGS) ×2
CANISTER SUCT 3000ML PPV (MISCELLANEOUS) ×3 IMPLANT
CATH THORACIC 28FR (CATHETERS) ×1 IMPLANT
CLIP VESOCCLUDE MED 6/CT (CLIP) ×3 IMPLANT
CONN ST 1/4X3/8  BEN (MISCELLANEOUS) ×1
CONN ST 1/4X3/8 BEN (MISCELLANEOUS) IMPLANT
CONT SPEC 4OZ CLIKSEAL STRL BL (MISCELLANEOUS) ×15 IMPLANT
COVER BACK TABLE 60X90IN (DRAPES) ×3 IMPLANT
COVER SURGICAL LIGHT HANDLE (MISCELLANEOUS) ×1 IMPLANT
DERMABOND ADVANCED (GAUZE/BANDAGES/DRESSINGS) ×1
DERMABOND ADVANCED .7 DNX12 (GAUZE/BANDAGES/DRESSINGS) IMPLANT
DRAPE LAPAROSCOPIC ABDOMINAL (DRAPES) ×3 IMPLANT
ELECT BLADE 4.0 EZ CLEAN MEGAD (MISCELLANEOUS) ×3
ELECT BLADE 6.5 EXT (BLADE) ×3 IMPLANT
ELECT REM PT RETURN 9FT ADLT (ELECTROSURGICAL) ×3
ELECTRODE BLDE 4.0 EZ CLN MEGD (MISCELLANEOUS) ×2 IMPLANT
ELECTRODE REM PT RTRN 9FT ADLT (ELECTROSURGICAL) ×2 IMPLANT
GAUZE SPONGE 4X4 12PLY STRL (GAUZE/BANDAGES/DRESSINGS) ×3 IMPLANT
GAUZE SPONGE 4X4 12PLY STRL LF (GAUZE/BANDAGES/DRESSINGS) ×1 IMPLANT
GLOVE BIO SURGEON STRL SZ 6.5 (GLOVE) ×6 IMPLANT
GOWN STRL REUS W/ TWL LRG LVL3 (GOWN DISPOSABLE) ×8 IMPLANT
GOWN STRL REUS W/TWL LRG LVL3 (GOWN DISPOSABLE) ×12
KIT BASIN OR (CUSTOM PROCEDURE TRAY) ×3 IMPLANT
KIT TURNOVER KIT B (KITS) ×3 IMPLANT
NDL SPNL 18GX3.5 QUINCKE PK (NEEDLE) ×2 IMPLANT
NEEDLE SPNL 18GX3.5 QUINCKE PK (NEEDLE) ×3 IMPLANT
NS IRRIG 1000ML POUR BTL (IV SOLUTION) ×6 IMPLANT
PACK CHEST (CUSTOM PROCEDURE TRAY) ×3 IMPLANT
PAD ARMBOARD 7.5X6 YLW CONV (MISCELLANEOUS) ×9 IMPLANT
PASSER SUT SWANSON 36MM LOOP (INSTRUMENTS) ×1 IMPLANT
POUCH ENDO CATCH II 15MM (MISCELLANEOUS) ×1 IMPLANT
RELOAD STAPLE 35X2.5 WHT THIN (STAPLE) IMPLANT
RELOAD STAPLE 45 4.1 GRN THCK (STAPLE) IMPLANT
RELOAD STAPLE 45 GOLD REG/THCK (STAPLE) IMPLANT
SOLUTION ANTI FOG 6CC (MISCELLANEOUS) ×3 IMPLANT
STAPLE RELOAD 2.5MM WHITE (STAPLE) ×21 IMPLANT
STAPLE RELOAD 45 GRN (STAPLE) ×2 IMPLANT
STAPLE RELOAD 45MM GOLD (STAPLE) ×6 IMPLANT
STAPLE RELOAD 45MM GREEN (STAPLE) ×3
STAPLER ECHELON POWERED (MISCELLANEOUS) ×1 IMPLANT
STAPLER VASCULAR ECHELON 35 (CUTTER) ×1 IMPLANT
STOPCOCK 4 WAY LG BORE MALE ST (IV SETS) ×3 IMPLANT
SUT SILK  1 MH (SUTURE) ×4
SUT SILK 1 MH (SUTURE) ×8 IMPLANT
SUT VIC AB 0 CTX 18 (SUTURE) ×3 IMPLANT
SUT VIC AB 2-0 CTX 36 (SUTURE) ×1 IMPLANT
SUT VICRYL 2 TP 1 (SUTURE) ×1 IMPLANT
SYR 20ML ECCENTRIC (SYRINGE) ×3 IMPLANT
SYR 5ML LL (SYRINGE) ×3 IMPLANT
SYRINGE 60CC LL (MISCELLANEOUS) ×3 IMPLANT
SYSTEM SAHARA CHEST DRAIN ATS (WOUND CARE) ×3 IMPLANT
TAPE CLOTH SURG 4X10 WHT LF (GAUZE/BANDAGES/DRESSINGS) ×1 IMPLANT
TAPE UMBILICAL COTTON 1/8X30 (MISCELLANEOUS) ×1 IMPLANT
TOWEL GREEN STERILE (TOWEL DISPOSABLE) ×3 IMPLANT
TOWEL GREEN STERILE FF (TOWEL DISPOSABLE) ×3 IMPLANT
TRAY FOLEY MTR SLVR 16FR STAT (SET/KITS/TRAYS/PACK) ×3 IMPLANT
TROCAR XCEL 12X100 BLDLESS (ENDOMECHANICALS) ×1 IMPLANT
TUBE CONNECTING 20X1/4 (TUBING) ×3 IMPLANT
TUBING EXTENTION W/L.L. (IV SETS) ×3 IMPLANT
VALVE BIOPSY  SINGLE USE (MISCELLANEOUS) ×1
VALVE BIOPSY SINGLE USE (MISCELLANEOUS) ×2 IMPLANT
WATER STERILE IRR 1000ML POUR (IV SOLUTION) ×3 IMPLANT

## 2018-06-24 NOTE — Brief Op Note (Addendum)
      [address]       [place], [id number]             [phone]     06/24/2018  11:36 AM  PATIENT:  Miguel Wolfe  73 y.o. male  PRE-OPERATIVE DIAGNOSIS:  LUL NODULE  POST-OPERATIVE DIAGNOSIS:  LUL NODULE  PROCEDURE:  Procedure(s): VIDEO ASSISTED THORACOSCOPY (VATS)/LUNG RESECTION (Left)  SURGEON:  Surgeon(s) and Role:    * Grace Isaac, MD - Primary  PHYSICIAN ASSISTANT:  Nicholes Rough, PA-C   ANESTHESIA:   general  EBL:  200 mL   BLOOD ADMINISTERED:none  DRAINS: ROUTINE   LOCAL MEDICATIONS USED:  BUPIVICAINE   SPECIMEN:  Source of Specimen:  LEFT UPPER LOBE  DISPOSITION OF SPECIMEN:  PATHOLOGY  COUNTS:  YES  DICTATION: .Dragon Dictation  PLAN OF CARE: Admit to inpatient   PATIENT DISPOSITION:  PACU - hemodynamically stable.   Delay start of Pharmacological VTE agent (>24hrs) due to surgical blood loss or risk of bleeding: yes

## 2018-06-24 NOTE — Anesthesia Procedure Notes (Signed)
Arterial Line Insertion Start/End6/16/2020 7:00 AM Performed by: Trinna Post., CRNA, CRNA  Patient location: Pre-op. Preanesthetic checklist: patient identified, IV checked, site marked, risks and benefits discussed, surgical consent, monitors and equipment checked, pre-op evaluation, timeout performed and anesthesia consent Lidocaine 1% used for infiltration and patient sedated Left, radial was placed Catheter size: 20 G Hand hygiene performed  and maximum sterile barriers used   Attempts: 2 Procedure performed without using ultrasound guided technique. Following insertion, dressing applied and Biopatch. Post procedure assessment: normal  Patient tolerated the procedure well with no immediate complications.

## 2018-06-24 NOTE — Anesthesia Procedure Notes (Signed)
Central Venous Catheter Insertion Performed by: Effie Berkshire, MD, anesthesiologist Start/End6/16/2020 6:55 AM, 06/24/2018 7:05 AM Patient location: Pre-op. Preanesthetic checklist: patient identified, IV checked, site marked, risks and benefits discussed, surgical consent, monitors and equipment checked, pre-op evaluation, timeout performed and anesthesia consent Position: Trendelenburg Lidocaine 1% used for infiltration and patient sedated Hand hygiene performed , maximum sterile barriers used  and Seldinger technique used Catheter size: 8 Fr Total catheter length 16. Central line was placed.Double lumen Procedure performed using ultrasound guided technique. Ultrasound Notes:anatomy identified, needle tip was noted to be adjacent to the nerve/plexus identified, no ultrasound evidence of intravascular and/or intraneural injection and image(s) printed for medical record Attempts: 1 Following insertion, dressing applied, line sutured and Biopatch. Post procedure assessment: blood return through all ports  Patient tolerated the procedure well with no immediate complications.

## 2018-06-24 NOTE — Transfer of Care (Signed)
Immediate Anesthesia Transfer of Care Note  Patient: Miguel Wolfe  Procedure(s) Performed: VIDEO ASSISTED THORACOSCOPY (VATS)/LUNG RESECTION (Left Chest)  Patient Location: PACU  Anesthesia Type:General  Level of Consciousness: awake, alert , oriented and drowsy  Airway & Oxygen Therapy: Patient Spontanous Breathing and Patient connected to face mask oxygen  Post-op Assessment: Report given to RN and Post -op Vital signs reviewed and stable  Post vital signs: Reviewed and stable  Last Vitals:  Vitals Value Taken Time  BP 126/91 06/24/18 1159  Temp    Pulse 64 06/24/18 1200  Resp 10 06/24/18 1200  SpO2 100 % 06/24/18 1200  Vitals shown include unvalidated device data.  Last Pain:  Vitals:   06/24/18 0553  PainSc: 0-No pain      Patients Stated Pain Goal: 3 (81/01/75 1025)  Complications: No apparent anesthesia complications

## 2018-06-24 NOTE — Anesthesia Procedure Notes (Signed)
Procedure Name: Intubation Date/Time: 06/24/2018 7:53 AM Performed by: Trinna Post., CRNA Pre-anesthesia Checklist: Patient identified, Emergency Drugs available, Suction available, Patient being monitored and Timeout performed Patient Re-evaluated:Patient Re-evaluated prior to induction Oxygen Delivery Method: Circle system utilized Preoxygenation: Pre-oxygenation with 100% oxygen Induction Type: IV induction, Rapid sequence and Cricoid Pressure applied Ventilation: Mask ventilation without difficulty Laryngoscope Size: Mac and 4 Grade View: Grade III Endobronchial tube: Left, Double lumen EBT, EBT position confirmed by fiberoptic bronchoscope and EBT position confirmed by auscultation and 37 Fr Tube size: 8.0 mm Number of attempts: 3 Airway Equipment and Method: Stylet,  Video-laryngoscopy and Fiberoptic brochoscope Placement Confirmation: ETT inserted through vocal cords under direct vision,  positive ETCO2 and breath sounds checked- equal and bilateral Tube secured with: Tape Dental Injury: Teeth and Oropharynx as per pre-operative assessment  Difficulty Due To: Difficulty was unanticipated Comments: Attempt x1 by CRNA with MAC 4 with grade III view of only posterior cords due to anterior larynx, could not advance DLT through cords even with anterior cricoid pressure applied. Attempt x1 by MD with MAC 4, able to pass 8.0 tube but no EtCO2 recorded, tube removed and patient ventilated. Glidescope 4 used to place 8.0 tube with success, cook exchange catheter used to exchange to 37Fr DLT with confirmation by vivasite bronchoscope. Patient remained stable throughout.  Would suggest glidescope use for future intubations

## 2018-06-24 NOTE — Anesthesia Postprocedure Evaluation (Signed)
Anesthesia Post Note  Patient: [name]  Procedure(s) Performed: VIDEO ASSISTED THORACOSCOPY (VATS)/LUNG RESECTION (Left Chest)     Patient location during evaluation: PACU Anesthesia Type: General Level of consciousness: awake and alert Pain management: pain level controlled Vital Signs Assessment: post-procedure vital signs reviewed and stable Respiratory status: spontaneous breathing, nonlabored ventilation, respiratory function stable and patient connected to nasal cannula oxygen Cardiovascular status: blood pressure returned to baseline and stable Postop Assessment: no apparent nausea or vomiting Anesthetic complications: no    Last Vitals:  Vitals:   06/24/18 1228 06/24/18 1230  BP:  133/81  Pulse:  74  Resp: 20 20  Temp:  (!) 36.4 C  SpO2: 95% 98%                 Effie Berkshire

## 2018-06-24 NOTE — H&P (Signed)
[address]       [place] [address]             [phone]                    Ferman L [place] Medical Record #[id number] Date of Birth: 1945-04-24  Referring: No ref. provider found Primary Care: [name], MD Primary Cardiologist: Candee Furbish, MD  Chief Complaint:    No chief complaint on file.   History of Present Illness:    Miguel Wolfe 73 y.o. male is seenfollowed in the office for   abnormal CT scan.  He has had a navigation bronchoscopy and biopsy of right and left upper lobe lesions. In nov 2019 and again last week , intially path was neghative. Serial ct showed stable size of nodule left upper lobe but because of persistence repeat nav bronch done last week and confirmed adeno ca, left upper lobe   First  CT scan of the chest demonstrating multiple pulmonary nodules and groundglass opacities.  The patient is followed by Dr. Marlou Porch for known coronary artery disease.  In February 2018 he had coronary stent placed.  He was maintained on Plavix for a year but since has discontinued it.  He has  seen by Dr. Marlou Porch in the office for vague left chest discomfort.  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 Lung, biopsy, LUL - ADENOCARCINOMA. SEE NOTE 2. Lung, biopsy, RUL - SCANT FRAGMENT OF BRONCHIAL WALL - NO EVIDENCE OF MALIGNANCY Diagnosis Note 1. Dr. Jeannie Done has reviewed this case and concurs with the above diagnosis. Dr. Servando Snare was notified on 06/12/2018. Jaquita Folds MD  Study Highlights    Nuclear stress EF: 55%.  Blood pressure demonstrated a normal response to exercise.  There was no ST segment deviation noted during stress.  Defect 1: There is a small defect of mild severity present in the apical inferior and apical lateral location.  Findings consistent with mild ischemia.  The left ventricular ejection  fraction is normal (55-65%).       In addition to the stress test chest x-ray showed a right lung nodule, this led to a CT scan of the chest which shows multiple pulmonary nodules and groundglass opacities.   The patient is a very limited smoker having smoked for less than 2 years between [place]  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  He he denies any environmental exposures, not aware of any asbestos exposure.  He does do some woodworking with wood dust exposure.  Patient denies any shortness of breath these had no hemoptysis no fever chills no cough.    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 - MARGINS NOT INVOLVED. - TUMOR CONFINED WITHIN THYROID CAPSULE.   Recent lung biopsy shows: Diagnosis 1. Lung, biopsy, LUL - ADENOCARCINOMA. SEE NOTE 2. Lung, biopsy, RUL - SCANT FRAGMENT OF BRONCHIAL WALL - NO EVIDENCE OF MALIGNANCY Diagnosis Note 1. Dr. Jeannie Done has reviewed this case and concurs with the above diagnosis. Dr. Servando Snare was notified on 06/12/2018. Jaquita Folds MD   Current Activity/ Functional Status:  Patient is independent with mobility/ambulation, transfers, ADL's, IADL's.   Zubrod Score: At the time of surgery this patients  most appropriate activity status/level should be described as: [x]     0    Normal activity, no symptoms []     1    Restricted in physical strenuous activity but ambulatory, able to do out light work []     2    Ambulatory and capable of self care, unable to do work activities, up and about               >50 % of waking hours                              []     3    Only limited self care, in bed greater than 50% of waking hours []     4    Completely disabled, no self care, confined to bed or chair []     5    Moribund   Past Medical History:  Diagnosis Date   Anginal pain ([place])     Arthritis    Colon polyps    Coronary artery disease involving native coronary artery of native heart without angina pectoris 04/23/2016   Myoview 02/08/16: EF 51, inf-lat, apical lateral scar with peri-infarct ischemia; intermediate risk  // LHC 2/18: dLAD 20, oD1 20, pLCx 40, pRCA 80, mRCA 20, dRCA 30, AM 90 >> PCI: 4 x 24 mm Synergy DES to pRCA    Family history of adverse reaction to anesthesia    Mom vomits and Dad went "nuts"   Fatigue    GERD (gastroesophageal reflux disease)    Hemorrhoids    History of echocardiogram    Echo 02/08/16: Mild LVH, EF 55-60, no RWMA, Gr 1 DD, trivial AI, mild LAE   Hyperlipidemia    Hypertension    PONV (postoperative nausea and vomiting)    Pre-diabetes    Pulmonary nodules     Past Surgical History:  Procedure Laterality Date   CARDIAC CATHETERIZATION     COLONOSCOPY WITH ESOPHAGOGASTRODUODENOSCOPY (EGD)  2008   CORONARY STENT INTERVENTION N/A 02/20/2016   Procedure: Coronary Stent Intervention;  Surgeon: Burnell Blanks, MD;  Location: [facility];  Service: Cardiovascular;  Laterality: N/A;   ESOPHAGOGASTRODUODENOSCOPY     LEFT HEART CATH AND CORONARY ANGIOGRAPHY N/A 02/20/2016   Procedure: Left Heart Cath and Coronary Angiography;  Surgeon: Burnell Blanks, MD;  Location: [facility];  Service: Cardiovascular;  Laterality: N/A;   PILONIDAL CYCT     REFRACTIVE SURGERY     retina tear repair   THYROIDECTOMY N/A 01/17/2018   Procedure: TOTAL THYROIDECTOMY;  Surgeon: Armandina Gemma, MD;  Location: [facility];  Service: General;  Laterality: N/A;   VIDEO BRONCHOSCOPY WITH ENDOBRONCHIAL NAVIGATION N/A 11/18/2017   Procedure: VIDEO BRONCHOSCOPY WITH ENDOBRONCHIAL NAVIGATION WITH BIOPSIES OF LEFT AND RIGHT UPPER LOBES;  Surgeon: Grace Isaac, MD;  Location: [facility];  Service: Thoracic;  Laterality: N/A;   VIDEO BRONCHOSCOPY WITH ENDOBRONCHIAL NAVIGATION N/A 06/11/2018   Procedure: VIDEO BRONCHOSCOPY WITH  ENDOBRONCHIAL NAVIGATION;  Surgeon: Grace Isaac, MD;  Location: [facility];  Service: Thoracic;  Laterality: N/A;    Family History  Problem Relation Age of Onset   Heart attack Father 40       BYPASS   Crohn's disease Sister    Colon cancer Paternal Uncle    Healthy Sister    Patient's daughter had a history of thyroid cancer status post thyroidectomy  Social History   Tobacco Use  Smoking Status Former Smoker   Types: Cigarettes   Quit date: 02/01/1967   Years since quitting: 51.4  Smokeless Tobacco Never Used    Social History   Substance and Sexual Activity  Alcohol Use Yes   Comment: a drink before dinner a few times a week     Allergies  Allergen Reactions   Gemfibrozil Other (See Comments)    Muscle pain   Niaspan [Niacin Er] Rash and Other (See Comments)    Flushing - aspirin did not mitigate    Simvastatin Other (See Comments)    Drug-Drug interaction with amlodipine    Current Facility-Administered Medications  Medication Dose Route Frequency Provider Last Rate Last Dose   ceFAZolin (ANCEF) IVPB 2g/100 mL premix  2 g Intravenous 30 min Pre-Op Grace Isaac, MD        Pertinent items are noted in HPI.     PHYSICAL EXAMINATION: BP 132/68    Pulse 65    Temp 97.9 F (36.6 C)    Resp 20    Ht 5\' 11"  (1.803 m)    Wt 79.2 kg    SpO2 98%    BMI 24.37 kg/m  General appearance: alert, cooperative and appears stated age Head: Normocephalic, without obvious abnormality, atraumatic Neck: no adenopathy, no carotid bruit, no JVD, supple, symmetrical, trachea midline and thyroid not enlarged, symmetric, no tenderness/mass/nodules Lymph nodes: Cervical, supraclavicular, and axillary nodes normal. Resp: clear to auscultation bilaterally Cardio: regular rate and rhythm, S1, S2 normal, no murmur, click, rub or gallop GI: soft, non-tender; bowel sounds normal; no masses,  no organomegaly Extremities: extremities normal, atraumatic, no cyanosis or  edema and Homans sign is negative, no sign of DVT Neurologic: Grossly normal  Diagnostic Studies & Laboratory data:     Recent Radiology Findings:  Ct Chest Wo Contrast  Result Date: 05/28/2018 CLINICAL DATA:  Left chest pain.  Follow up lung nodule. EXAM: CT CHEST WITHOUT CONTRAST TECHNIQUE: Multidetector CT imaging of the chest was performed following the standard protocol without IV contrast. COMPARISON:  Multiple exams, including 02/26/2018 FINDINGS: Cardiovascular: Atherosclerotic calcification of the aortic arch, left anterior descending, right left circumflex coronary arteries. Mediastinum/Nodes: Prior thyroidectomy. No pathologic adenopathy observed. Lungs/Pleura: Biapical pleuroparenchymal scarring. Right upper lobe ground-glass density pulmonary nodule 3.0 cm in long axis, previously the same on oldest available comparison of 10/23/2017 by my measurements. Peribronchovascular nodules in the left upper lobe are present with a solid nodule measuring 1.3 by 0.9 cm on image 42/3 (formerly the same on 10/23/2017) and ground-glass density components including a 1.1 cm component on image 31/3 and a 1.3 by 0.5 cm component on image 32/3, these likewise appear stable from the earliest available comparison of 10/23/2017. A 5 mm sub solid nodule in the left upper lobe on image 53/3 is stable. Lingular scarring, stable. Back on 10/23/2017 there was a 10 mm solid-appearing right lower lobe pulmonary nodule; in this vicinity on today's exam there is only some faint scarring on image 111/3, that nodule is otherwise resolved. Upper Abdomen: The thoracoabdominal junction, a right paraspinal soft tissue density measuring 2.4 by 1.7 cm has some faint calcification along its anterior margin. This is immediately adjacent to the right side of the L1 vertebral body and may slightly scallop the vertebral body margin although there is intact cortex. This lesion measured the same on 10/23/2017, and only head low-grade  metabolic activity on prior PET-CT. Abdominal aortic atherosclerotic calcification is present. Musculoskeletal: Unremarkable IMPRESSION: 1. Bilateral upper lobe ground-glass density  pulmonary nodules, with a peribronchovascular solid nodular component in the left upper lobe. These nodules are stable compared to the earliest available comparison of 10/23/2017. Low-grade adenocarcinoma not excluded for the ground-glass density nodules, and the solid component of the left upper lobe nodule is nonspecific but bronchial carcinoid tumor not excluded. We have not demonstrated 7 months of stability. Continued surveillance is recommended. 2. In the right paraspinal space at L1 there is 2.4 by 1.7 cm soft tissue density oval-shaped nodule with faint anterior calcification which has low-grade activity but not overtly hypermetabolic activity at PET-CT. Stable since earliest available comparison of 10/23/2017. This is probably a benign lesion such as schwannoma or neurofibroma, but surveillance of this lesion is also suggested. 3.  Aortic Atherosclerosis (ICD10-I70.0).  Coronary atherosclerosis. 4. Prior thyroidectomy. Electronically Signed   By: Van Clines M.D.   On: 05/28/2018 16:02    I have independently reviewed the above radiology studies  and reviewed the findings with the patient.     Dg Chest 2 View  Result Date: 10/14/2017 CLINICAL DATA:  LEFT axillary chest pain for 4-6 weeks, no known injury, history hypertension, former smoker, coronary artery disease post stenting EXAM: CHEST - 2 VIEW COMPARISON:  None FINDINGS: Normal heart size, mediastinal contours, and pulmonary vascularity. Coronary stent noted. Atherosclerotic calcification aorta. Minimal subsegmental atelectasis LEFT base. 10 mm nodular density lower lateral RIGHT lung, doubt nipple shadow from inferior location. Remaining lungs clear. No infiltrate, pleural effusion or pneumothorax. No acute osseous findings. IMPRESSION: Minimal LEFT  basilar atelectasis with a 10 mm nonspecific nodular density at the lateral RIGHT lung base; pulmonary nodule not excluded and further assessment by CT chest recommended to exclude pulmonary nodule/tumor. These results will be called to the ordering clinician or representative by the Radiologist Assistant, and communication documented in the PACS or zVision Dashboard. Electronically Signed   By: Lavonia Dana M.D.   On: 10/14/2017 14:22   Ct Chest Wo Contrast  Result Date: 10/24/2017 CLINICAL DATA:  Evaluate pulmonary nodule. Abnormal chest radiograph. EXAM: CT CHEST WITHOUT CONTRAST TECHNIQUE: Multidetector CT imaging of the chest was performed following the standard protocol without IV contrast. COMPARISON:  None FINDINGS: Cardiovascular: The heart size appears mildly enlarged. Aortic atherosclerosis. Calcification in the LAD left circumflex and RCA coronary artery noted. Mediastinum/Nodes: No enlarged mediastinal or axillary lymph nodes. The trachea appears patent and is midline. Normal appearance of the esophagus. Right lobe of thyroid gland nodule measures 1.7 cm, image 18/2. Lungs/Pleura: Bilateral mixed attenuation pulmonary nodules identified. Within the right upper lobe there is a pure ground-glass attenuating nodule which measures 2.7 cm, image 38/3. Solid-appearing nodule within the right lower lobe measures 1 cm, image 110/3. Ground-glass attenuating nodule within the left upper lobe measures 5 mm, image 55/3. Within the left upper lobe there is a mixed ground-glass and solid attenuating nodule. This measures 2 cm with a central solid component of 1.4 cm, image 61/5. Several adjacent areas of ground-glass nodularity are identified including a 1 cm left apical nodule, image 33/3. Upper Abdomen: No acute abnormality. Musculoskeletal: No chest wall mass or suspicious bone lesions identified. IMPRESSION: 1. Bilateral ground-glass, solid, and part solid pulmonary nodules are identified. Within the left  upper lobe there is a part solid nodule measuring 2 cm with a 1.4 cm solid component. In the right lower lobe there is a solid subpleural nodule measuring 1 cm. 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 Thoracic surgery consultation is recommended. 2.  Aortic Atherosclerosis (ICD10-I70.0). 3. Multi vessel  coronary artery atherosclerotic calcification 4. Thyroid nodule measuring 1.7 cm noted. Consider further evaluation with thyroid ultrasound. If patient is clinically hyperthyroid, consider nuclear medicine thyroid uptake and scan. Electronically Signed   By: Kerby Moors M.D.   On: 10/24/2017 11:31    Recent Lab Findings: Lab Results  Component Value Date   WBC 6.0 06/20/2018   HGB 15.1 06/20/2018   HCT 45.4 06/20/2018   PLT 171 06/20/2018   GLUCOSE 150 (H) 06/20/2018   CHOL 151 01/21/2017   TRIG 177 (H) 01/21/2017   HDL 48 01/21/2017   LDLCALC 68 01/21/2017   ALT 20 06/20/2018   AST 25 06/20/2018   NA 139 06/20/2018   K 4.0 06/20/2018   CL 106 06/20/2018   CREATININE 0.66 06/20/2018   BUN 16 06/20/2018   CO2 23 06/20/2018   TSH 10.736 (H) 01/24/2018   INR 1.1 06/20/2018   HGBA1C 5.9 (H) 06/09/2018   Cath February 2018, most recent  Acute Mrg lesion, 90 %stenosed.  Mid RCA lesion, 20 %stenosed.  Dist RCA lesion, 30 %stenosed.  Prox Cx to Mid Cx lesion, 20 %stenosed.  1st Diag lesion, 20 %stenosed.  Dist LAD lesion, 20 %stenosed.  A STENT SYNERGY DES [id number] drug eluting stent was successfully placed.  Prox RCA to Mid RCA lesion, 80 %stenosed.  Post intervention, there is a 0% residual stenosis.   1. Severe single vessel CAD (severe stenosis mid RCA) 2. Successful PTCA/DES x 1 mid RCA  3. Mild non-obstructive disease Circumflex and LAD  Recommendations: Will continue DAPT with ASA and Plavix for one year. If he needs to interrupt anti-platelet therapy for a toe surgery, this could be considered at 3 months since a Synergy DES was placed, although it  would be optimal to continue for at least 6 months. Will discharge home today as part of the same day PCI protocol. Will changed Prilosec to Protonix 40 mg daily. He will be started on Plavix 75 mg daily. Follow up with Dr. Marlou Porch or office APP in 1 week.   PFT's 3.16 93% 33.61 99% The FVC, FEV1, FEV1/FVC ratio and FEF25-75% are within normal limits, but there is curvature to the flow volume loop suggesting minimal small airway disease. The airway resistance is normal. Lung volumes are within normal limits. Following administration of bronchodilators, there is no significant response. The diffusing capacity is normal. However, the diffusing capacity was not corrected for the patient's hemoglobin. Conclusions: Minimal airway obstruction is present suggesting small airway disease. 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 Now the left upper lobe mass is bx proven adneo ca.  I have again discussed with the patient proceeding with resection of left upper lesion, he did not wish to immediately proceed with lung resection but was willing to repeat a navigation bronchoscopy with specific attention to the left upper lobe lesion. 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 The expectations of surgery, postop tubes and post op care have been reviewed  The goals risks and alternatives of the planned surgical procedure Procedure(s): VIDEO BRONCHOSCOPY (N/A) VIDEO ASSISTED THORACOSCOPY (VATS)/LUNG RESECTION (Left)  have been discussed with the patient in detail. The risks of the procedure including death, infection,  stroke, prolonged air leak, myocardial  infarction, bleeding, blood transfusion have all been discussed specifically.  I have quoted Miguel Wolfe a 2 % of perioperative mortality and a complication rate as high as 40 %. The patient's questions have been answered.Miguel Wolfe is willing  to proceed with the planned procedure.   #2 history of coronary artery disease status post stenting  #3 1.7 cm right thyroid nodule- papillary ca of thyroid after total thyroidectomy    Grace Isaac MD      [address] [place] [address] Office [phone]   Beeper [phone]  06/24/2018 6:39 AM

## 2018-06-24 NOTE — Progress Notes (Signed)
Patient ID: Miguel Wolfe, male   DOB: 06-Sep-1945, 73 y.o.   MRN: [id number] EVENING ROUNDS NOTE :     [address]       [place] [address]             [phone]                 Day of Surgery Procedure(s) (LRB): VIDEO ASSISTED THORACOSCOPY (VATS)/LUNG RESECTION (Left)  Total Length of Stay:  LOS: 0 days  BP 122/69 (BP Location: Right Arm)   Pulse 75   Temp 98 F (36.7 C) (Oral)   Resp 16   Ht 5\' 11"  (1.803 m)   Wt 79.2 kg   SpO2 95%   BMI 24.37 kg/m   .Intake/Output      06/15 0701 - 06/16 0700 06/16 0701 - 06/17 0700   P.O.  120   I.V. (mL/kg)  1363.2 (17.2)   Total Intake(mL/kg)  1483.2 (18.7)   Urine (mL/kg/hr)  1345 (1.6)   Blood  200   Chest Tube  212   Total Output  1757   Net  -273.8          .  ceFAZolin (ANCEF) IV 2 g (06/24/18 1700)  . dextrose 5 % and 0.45% NaCl 100 mL/hr at 06/24/18 1700  . potassium chloride       Lab Results  Component Value Date   WBC 6.0 06/20/2018   HGB 15.1 06/20/2018   HCT 45.4 06/20/2018   PLT 171 06/20/2018   GLUCOSE 150 (H) 06/20/2018   CHOL 151 01/21/2017   TRIG 177 (H) 01/21/2017   HDL 48 01/21/2017   LDLCALC 68 01/21/2017   ALT 20 06/20/2018   AST 25 06/20/2018   NA 139 06/20/2018   K 4.0 06/20/2018   CL 106 06/20/2018   CREATININE 0.66 06/20/2018   BUN 16 06/20/2018   CO2 23 06/20/2018   TSH 10.736 (H) 01/24/2018   INR 1.1 06/20/2018   HGBA1C 5.9 (H) 06/09/2018    Stable postop No air leak   Grace Isaac MD  Beeper [phone] Office [phone] 06/24/2018 5:27 PM

## 2018-06-24 NOTE — Progress Notes (Signed)
1st attempt to call report to 2h07, CN to call back

## 2018-06-25 ENCOUNTER — Encounter: Payer: Self-pay | Admitting: *Deleted

## 2018-06-25 ENCOUNTER — Inpatient Hospital Stay (HOSPITAL_COMMUNITY): Payer: Medicare Other

## 2018-06-25 ENCOUNTER — Encounter: Payer: Self-pay | Admitting: Cardiothoracic Surgery

## 2018-06-25 ENCOUNTER — Encounter (HOSPITAL_COMMUNITY): Payer: Self-pay | Admitting: Cardiothoracic Surgery

## 2018-06-25 DIAGNOSIS — C3410 Malignant neoplasm of upper lobe, unspecified bronchus or lung: Secondary | ICD-10-CM | POA: Insufficient documentation

## 2018-06-25 LAB — POCT I-STAT 7, (LYTES, BLD GAS, ICA,H+H)
Acid-Base Excess: 3 mmol/L — ABNORMAL HIGH (ref 0.0–2.0)
Bicarbonate: 28.7 mmol/L — ABNORMAL HIGH (ref 20.0–28.0)
Calcium, Ion: 1.08 mmol/L — ABNORMAL LOW (ref 1.15–1.40)
HCT: 44 % (ref 39.0–52.0)
Hemoglobin: 15 g/dL (ref 13.0–17.0)
O2 Saturation: 96 %
Patient temperature: 98.9
Potassium: 3.2 mmol/L — ABNORMAL LOW (ref 3.5–5.1)
Sodium: 137 mmol/L (ref 135–145)
TCO2: 30 mmol/L (ref 22–32)
pCO2 arterial: 45.5 mmHg (ref 32.0–48.0)
pH, Arterial: 7.409 (ref 7.350–7.450)
pO2, Arterial: 83 mmHg (ref 83.0–108.0)

## 2018-06-25 LAB — BASIC METABOLIC PANEL
Anion gap: 11 (ref 5–15)
BUN: 7 mg/dL — ABNORMAL LOW (ref 8–23)
CO2: 26 mmol/L (ref 22–32)
Calcium: 8.2 mg/dL — ABNORMAL LOW (ref 8.9–10.3)
Chloride: 99 mmol/L (ref 98–111)
Creatinine, Ser: 0.67 mg/dL (ref 0.61–1.24)
GFR calc Af Amer: >60 mL/min (ref >60)
GFR calc non Af Amer: >60 mL/min (ref >60)
Glucose, Bld: 159 mg/dL — ABNORMAL HIGH (ref 70–99)
Potassium: 3.3 mmol/L — ABNORMAL LOW (ref 3.5–5.1)
Sodium: 136 mmol/L (ref 135–145)

## 2018-06-25 LAB — CBC
HCT: 43.3 % (ref 39.0–52.0)
Hemoglobin: 14.4 g/dL (ref 13.0–17.0)
MCH: 30.1 pg (ref 26.0–34.0)
MCHC: 33.3 g/dL (ref 30.0–36.0)
MCV: 90.4 fL (ref 80.0–100.0)
Platelets: 166 10*3/uL (ref 150–400)
RBC: 4.79 MIL/uL (ref 4.22–5.81)
RDW: 12.2 % (ref 11.5–15.5)
WBC: 20.1 10*3/uL — ABNORMAL HIGH (ref 4.0–10.5)
nRBC: 0 % (ref 0.0–0.2)

## 2018-06-25 LAB — GLUCOSE, CAPILLARY
Glucose-Capillary: 150 mg/dL — ABNORMAL HIGH (ref 70–99)
Glucose-Capillary: 135 mg/dL — ABNORMAL HIGH (ref 70–99)
Glucose-Capillary: 140 mg/dL — ABNORMAL HIGH (ref 70–99)
Glucose-Capillary: 116 mg/dL — ABNORMAL HIGH (ref 70–99)
Glucose-Capillary: 131 mg/dL — ABNORMAL HIGH (ref 70–99)

## 2018-06-25 LAB — POCT ACTIVATED CLOTTING TIME: Activated Clotting Time: 0 seconds

## 2018-06-25 MED ORDER — POTASSIUM CHLORIDE 10 MEQ/50ML IV SOLN
10.0000 meq | INTRAVENOUS | Status: AC
  Administered 2018-06-25 (×2): 10 meq via INTRAVENOUS
  Filled 2018-06-25 (×3): qty 50

## 2018-06-25 MED ORDER — ALUM & MAG HYDROXIDE-SIMETH 200-200-20 MG/5ML PO SUSP
30.0000 mL | Freq: Four times a day (QID) | ORAL | Status: DC | PRN
  Administered 2018-06-25 – 2018-06-29 (×6): 30 mL via ORAL
  Filled 2018-06-25 (×6): qty 30

## 2018-06-25 MED ORDER — ZOLPIDEM TARTRATE 5 MG PO TABS
5.0000 mg | ORAL_TABLET | Freq: Every evening | ORAL | Status: DC | PRN
  Administered 2018-06-25 – 2018-06-29 (×5): 5 mg via ORAL
  Filled 2018-06-25 (×5): qty 1

## 2018-06-25 MED ORDER — AMLODIPINE BESYLATE 10 MG PO TABS
10.0000 mg | ORAL_TABLET | Freq: Every day | ORAL | Status: DC
  Administered 2018-06-25 – 2018-06-29 (×5): 10 mg via ORAL
  Filled 2018-06-25 (×5): qty 1

## 2018-06-25 MED ORDER — PROPRANOLOL HCL 10 MG PO TABS
20.0000 mg | ORAL_TABLET | Freq: Every day | ORAL | Status: DC
  Administered 2018-06-25 – 2018-06-29 (×5): 20 mg via ORAL
  Administered 2018-06-30: 10 mg via ORAL
  Filled 2018-06-25: qty 1
  Filled 2018-06-25: qty 2
  Filled 2018-06-25: qty 1
  Filled 2018-06-25 (×3): qty 2

## 2018-06-25 MED ORDER — POTASSIUM CHLORIDE CRYS ER 20 MEQ PO TBCR
20.0000 meq | EXTENDED_RELEASE_TABLET | Freq: Three times a day (TID) | ORAL | Status: DC
  Administered 2018-06-25 (×3): 20 meq via ORAL
  Filled 2018-06-25 (×3): qty 1

## 2018-06-25 MED ORDER — CHLORHEXIDINE GLUCONATE CLOTH 2 % EX PADS
6.0000 | MEDICATED_PAD | Freq: Every day | CUTANEOUS | Status: DC
  Administered 2018-06-27 – 2018-06-29 (×3): 6 via TOPICAL

## 2018-06-25 NOTE — Progress Notes (Signed)
67mL Fentanyl syringe for PCA wasted with Addie, RN. 2nd 47mL syringe of Fentanyl handed off to oncoming nurse Santiago Glad, RN

## 2018-06-25 NOTE — Progress Notes (Signed)
Oncology Nurse Navigator Documentation  Oncology Nurse Navigator Flowsheets 06/25/2018  Navigator Location [facility]  Navigator Encounter Type Other/I received a message from that pathology needs icd and stage on Miguel Wolfe.  They have been updated, I will update Dr. Servando Snare.   Barriers/Navigation Needs Coordination of Care  Interventions Coordination of Care  Coordination of Care Other  Acuity Level 2  Time Spent with Patient 15

## 2018-06-25 NOTE — Progress Notes (Signed)
EVENING ROUNDS NOTE :     [address]       [place] [address]             [phone]                 1 Day Post-Op Procedure(s) (LRB): VIDEO ASSISTED THORACOSCOPY (VATS)/LUNG RESECTION (Left)  Total Length of Stay:  LOS: 1 day  BP 135/77   Pulse 93   Temp 98.8 F (37.1 C) (Oral)   Resp 19   Ht 5\' 11"  (1.803 m)   Wt 77.7 kg   SpO2 92%   BMI 23.89 kg/m   .Intake/Output      06/16 0701 - 06/17 0700 06/17 0701 - 06/18 0700   P.O. 120 400   I.V. (mL/kg) 2606.4 (33.5) 195.4 (2.5)   IV Piggyback 100 100.1   Total Intake(mL/kg) 2826.4 (36.4) 695.4 (9)   Urine (mL/kg/hr) 3005 (1.6) 700 (0.8)   Blood 200    Chest Tube 352 20   Total Output 3557 720   Net -730.6 -24.6          . dextrose 5 % and 0.45% NaCl 10 mL/hr at 06/25/18 0823  . potassium chloride       Lab Results  Component Value Date   WBC 20.1 (H) 06/25/2018   HGB 15.0 06/25/2018   HCT 44.0 06/25/2018   PLT 166 06/25/2018   GLUCOSE 159 (H) 06/25/2018   CHOL 151 01/21/2017   TRIG 177 (H) 01/21/2017   HDL 48 01/21/2017   LDLCALC 68 01/21/2017   ALT 20 06/20/2018   AST 25 06/20/2018   NA 137 06/25/2018   K 3.2 (L) 06/25/2018   CL 99 06/25/2018   CREATININE 0.67 06/25/2018   BUN 7 (L) 06/25/2018   CO2 26 06/25/2018   TSH 10.736 (H) 01/24/2018   INR 1.1 06/20/2018   HGBA1C 5.9 (H) 06/09/2018    Stable  Waiting for step down bed  Cancer Staging Lung cancer, left upper lobe [facility]) Staging form: Lung, AJCC 8th Edition - Pathologic stage from 06/23/2018: Stage IA3 (pT1c, pN0, cM0) - Signed by Grace Isaac, MD on 06/25/2018 - Clinical: No stage assigned - Unsigned  path discussed with patient   Grace Isaac MD  Beeper [phone] Office [phone] 06/25/2018 5:36 PM

## 2018-06-25 NOTE — Progress Notes (Signed)
Went in patient room to get ABG off Art Line.  Could not pull back. Worked with same line earlier and was able to get blood, so re-dressed line.  When I went back second time, it would not draw back.  Separated saline line from Art Catheter, there was no blood return,  Pulled catheter out and bandaged patient.  Did arterial stick for ABG.  Gave results to RN of pH 7.409, CO2 45.5, PaO2 83, and Bicarb of 28.7.  Will continue to monitor patient.

## 2018-06-25 NOTE — Progress Notes (Addendum)
[facility] DAILY ICU PROGRESS NOTE                   [address]            [place] [address]          [phone]   1 Day Post-Op Procedure(s) (LRB): VIDEO ASSISTED THORACOSCOPY (VATS)/LUNG RESECTION (Left)  Total Length of Stay:  LOS: 1 day   Subjective: No issues this morning. Pain is well controlled. He did have some nausea but that has seemed to pass.   Objective: Vital signs in last 24 hours: Temp:  [97.2 F (36.2 C)-99.1 F (37.3 C)] 98.9 F (37.2 C) (06/17 0400) Pulse Rate:  [68-75] 75 (06/16 1245) Cardiac Rhythm: Normal sinus rhythm (06/17 0400) Resp:  [11-28] 25 (06/17 0700) BP: (109-144)/(69-92) 139/89 (06/17 0500) SpO2:  [91 %-100 %] 95 % (06/17 0700) Arterial Line BP: (52-170)/(44-96) 89/79 (06/17 0500) Weight:  [77.7 kg] 77.7 kg (06/17 0600)  Filed Weights   06/24/18 0553 06/25/18 0600  Weight: 79.2 kg 77.7 kg    Weight change: -1.543 kg   Hemodynamic parameters for last 24 hours:    Intake/Output from previous day: 06/16 0701 - 06/17 0700 In: 2826.4 [P.O.:120; I.V.:2606.4; IV Piggyback:100] Out: 3220 [Urine:3005; Blood:200; Chest Tube:352]  Intake/Output this shift: No intake/output data recorded.  Current Meds: Scheduled Meds:  acetaminophen  1,000 mg Oral Q6H   Or   acetaminophen (TYLENOL) oral liquid 160 mg/5 mL  1,000 mg Oral Q6H   aspirin EC  81 mg Oral QPM   bisacodyl  10 mg Oral Daily   Chlorhexidine Gluconate Cloth  6 each Topical Daily   enoxaparin (LOVENOX) injection  40 mg Subcutaneous Daily   fentaNYL   Intravenous Q4H   insulin aspart  0-24 Units Subcutaneous Q4H   levothyroxine  137 mcg Oral QAC breakfast   mouth rinse  15 mL Mouth Rinse BID   pantoprazole  40 mg Oral Daily   rosuvastatin  20 mg Oral Daily   senna-docusate  1 tablet Oral QHS   Continuous Infusions:  dextrose 5 % and 0.45% NaCl 100 mL/hr at 06/25/18 0700   potassium chloride     potassium chloride 10 mEq (06/25/18 0708)    PRN Meds:.diphenhydrAMINE **OR** diphenhydrAMINE, naloxone **AND** sodium chloride flush, ondansetron (ZOFRAN) IV, ondansetron (ZOFRAN) IV, oxyCODONE, potassium chloride, traMADol  General appearance: alert, cooperative and no distress Heart: regular rate and rhythm, S1, S2 normal, no murmur, click, rub or gallop Lungs: clear to auscultation bilaterally Abdomen: soft, non-tender; bowel sounds normal; no masses,  no organomegaly Extremities: extremities normal, atraumatic, no cyanosis or edema Wound: clean and dry. Some drainage around the chest tubes  Lab Results: CBC: Recent Labs    06/25/18 0430 06/25/18 0536  WBC 20.1*  --   HGB 14.4 15.0  HCT 43.3 44.0  PLT 166  --    BMET:  Recent Labs    06/25/18 0430 06/25/18 0536  NA 136 137  K 3.3* 3.2*  CL 99  --   CO2 26  --   GLUCOSE 159*  --   BUN 7*  --   CREATININE 0.67  --   CALCIUM 8.2*  --     CMET: Lab Results  Component Value Date   WBC 20.1 (H) 06/25/2018   HGB 15.0 06/25/2018   HCT 44.0 06/25/2018   PLT 166 06/25/2018   GLUCOSE 159 (H) 06/25/2018   CHOL 151 01/21/2017   TRIG 177 (H)  01/21/2017   HDL 48 01/21/2017   LDLCALC 68 01/21/2017   ALT 20 06/20/2018   AST 25 06/20/2018   NA 137 06/25/2018   K 3.2 (L) 06/25/2018   CL 99 06/25/2018   CREATININE 0.67 06/25/2018   BUN 7 (L) 06/25/2018   CO2 26 06/25/2018   TSH 10.736 (H) 01/24/2018   INR 1.1 06/20/2018   HGBA1C 5.9 (H) 06/09/2018      PT/INR: No results for input(s): LABPROT, INR in the last 72 hours. Radiology: Dg Chest Port 1 View  Result Date: 06/24/2018 CLINICAL DATA:  Lung nodule.  Chest tube. EXAM: PORTABLE CHEST 1 VIEW COMPARISON:  06/20/2018 FINDINGS: The right-sided central venous catheter is well positioned over the SVC. Two left-sided chest tubes are noted. The patient has undergone left upper lobe resection. No evidence of a significant left-sided pneumothorax. There is some mild elevation of the left hemidiaphragm. There is some  postoperative atelectasis at the lung bases. There is gaseous distention of the stomach. The heart size is borderline enlarged. Aortic calcifications are noted. The lung volumes are somewhat low. IMPRESSION: Lines and tubes as above without evidence of a left-sided pneumothorax. Electronically Signed   By: Constance Holster M.D.   On: 06/24/2018 14:24     Assessment/Plan: S/P Procedure(s) (LRB): VIDEO ASSISTED THORACOSCOPY (VATS)/LUNG RESECTION (Left)  1. CV-NSR in the 80s-90s. BP well controlled-Will add Norvasc since BP starting to climb. Off all drips. 2. Pulm-tolerating 4L [place] with good oxygen saturation. CXR stable this morning-will await official read. Chest tubes put out 352cc/since surgery. Keep. 3. Renal-hypokalemia- replacing. Creatinine 0.67. 4. H and H 15.0/44.0, expected acute blood loss anemia.  5. Endo-blood glucose well controlled 6. Continue Lovenox for DVT prophylaxis 7. Anxiety-Added home dose of propanolol.   Plan: Cut IV fluids in half. Discontinue arterial line and foley catheter. Keep chest tubes one more day due to output. Ambulate in the halls. Encouraged incentive spirometer and weaning of oxygen. Continue PCA for pain control.       Elgie Collard 06/25/2018 7:45 AM  Cancer Staging Lung cancer, 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[place]) Staging form: Lung, AJCC 8th Edition - Pathologic stage from 06/23/2018: Stage IA3 (pT1c, pN0, cM0) - Signed by Grace Isaac, MD on 06/25/2018 - Clinical: No stage assigned - Unsigned  Discussed stage with patient  I have seen and examined [name] and agree with the above assessment  and plan.  Grace Isaac MD Beeper [phone] Office [phone] 06/25/2018 2:11 PM

## 2018-06-26 ENCOUNTER — Inpatient Hospital Stay (HOSPITAL_COMMUNITY): Payer: Medicare Other

## 2018-06-26 LAB — COMPREHENSIVE METABOLIC PANEL
ALT: 17 U/L (ref 0–44)
AST: 31 U/L (ref 15–41)
Albumin: 4 g/dL (ref 3.5–5.0)
Alkaline Phosphatase: 54 U/L (ref 38–126)
Anion gap: 9 (ref 5–15)
BUN: 9 mg/dL (ref 8–23)
CO2: 29 mmol/L (ref 22–32)
Calcium: 8.6 mg/dL — ABNORMAL LOW (ref 8.9–10.3)
Chloride: 101 mmol/L (ref 98–111)
Creatinine, Ser: 0.63 mg/dL (ref 0.61–1.24)
GFR calc Af Amer: >60 mL/min (ref >60)
GFR calc non Af Amer: >60 mL/min (ref >60)
Glucose, Bld: 114 mg/dL — ABNORMAL HIGH (ref 70–99)
Potassium: 3.7 mmol/L (ref 3.5–5.1)
Sodium: 139 mmol/L (ref 135–145)
Total Bilirubin: 1.8 mg/dL — ABNORMAL HIGH (ref 0.3–1.2)
Total Protein: 6.3 g/dL — ABNORMAL LOW (ref 6.5–8.1)

## 2018-06-26 LAB — CBC
HCT: 44.4 % (ref 39.0–52.0)
Hemoglobin: 14.7 g/dL (ref 13.0–17.0)
MCH: 30.2 pg (ref 26.0–34.0)
MCHC: 33.1 g/dL (ref 30.0–36.0)
MCV: 91.4 fL (ref 80.0–100.0)
Platelets: 164 10*3/uL (ref 150–400)
RBC: 4.86 MIL/uL (ref 4.22–5.81)
RDW: 12.5 % (ref 11.5–15.5)
WBC: 13.5 10*3/uL — ABNORMAL HIGH (ref 4.0–10.5)
nRBC: 0 % (ref 0.0–0.2)

## 2018-06-26 LAB — GLUCOSE, CAPILLARY
Glucose-Capillary: 101 mg/dL — ABNORMAL HIGH (ref 70–99)
Glucose-Capillary: 112 mg/dL — ABNORMAL HIGH (ref 70–99)
Glucose-Capillary: 112 mg/dL — ABNORMAL HIGH (ref 70–99)
Glucose-Capillary: 127 mg/dL — ABNORMAL HIGH (ref 70–99)
Glucose-Capillary: 130 mg/dL — ABNORMAL HIGH (ref 70–99)
Glucose-Capillary: 148 mg/dL — ABNORMAL HIGH (ref 70–99)
Glucose-Capillary: 86 mg/dL (ref 70–99)
Glucose-Capillary: 92 mg/dL (ref 70–99)

## 2018-06-26 MED ORDER — SALINE SPRAY 0.65 % NA SOLN
1.0000 | NASAL | Status: DC | PRN
  Filled 2018-06-26: qty 44

## 2018-06-26 NOTE — Discharge Summary (Addendum)
Physician Discharge Summary  Patient ID: Miguel Wolfe MRN: [id number] DOB/AGE: 71/07/1945 71 y.o.  Admit date: 06/24/2018 Discharge date: 06/30/2018  Admission Diagnoses: Patient Active Problem List   Diagnosis Date Noted  . Lung cancer, 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H[facility]) 06/25/2018  . Lung nodule 06/24/2018  . Multiple thyroid nodules 01/15/2018  . 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 Coronary artery disease involving native coronary artery of native heart without angina pectoris 04/23/2016  . Hyperlipidemia   . GERD (gastroesophageal reflux disease)   . Essential hypertension 02/16/2016    Discharge Diagnoses:  Active Problems:   Lung nodule   Discharged Condition: good  HPI:  Miguel Wolfe 73 y.o. male is seen followed in the office for an abnormal CT scan.  He has had a navigation bronchoscopy and biopsy of right and left upper lobe lesions. In nov 2019 and again last week , intially path was neghative. Serial ct showed stable size of nodule left upper lobe but because of persistence repeat nav bronch done last week and confirmed adeno ca, left upper lobe   First  CT scan of the chest demonstrating multiple pulmonary nodules and groundglass opacities.  The patient is followed by Dr. Anne Wolfe for known coronary artery disease.  In February 2018 he had coronary stent placed.  He was maintained on Plavix for a year but since has discontinued it.  He has  seen by Dr. Anne Wolfe in the office for vague left chest discomfort.  A stress test was performed  Patient had navigation bronchoscopy two days ago results confirm left upper lobe adenocarcinoma.    Hospital Course:   Miguel Wolfe underwent a left upper lobectomy with lymph node dissection on 06/24/2018 with Dr. Tyrone Wolfe. The patient tolerated the procedure well and was transferred to the surgical ICU for continued care. He was extubated in a timely manner. POD 1 is pain was well controlled but he did have some nausea. He  remained in NSR and on 4L [place]. His chest tubes were kept in due to output. He did have some expected acute blood loss anemia. We restarted him on his home Norvasc dose for his BP. We added his home dose of propanolol for anxiety. We discontinued his arterial line and foley catheter. We continued to encourage incentive spirometer for weaning of supplemental oxygen and pulmonary toilet. POD 2 he was more sore due to activity. He has a small air leak in the posterior chest tube therefore, we will remove the anterior chest tube. Discontinue central line. Fluids have been decreased since the patient is now taking PO mediations and drinking. Dr. Tyrone Wolfe discussed with pathology results with the patient. The patient was cleared to be transferred from [facility] to the stepdown unit for continued care.  He remained clinically stable.  His chest tubes were removed on 06/28/2018.  Follow up CXR remains stable.  His incision is healing without evidence of infection.  He is ambulating independently.  He is medically stable for discharge home today.  Consults: oncology  Significant Diagnostic Studies:    Patient: ANTAWAN, Wolfe Collected: 06/24/2018 Client: [facility] Accession: [id number] Received: 06/24/2018 Sheliah Wolfe DOB: [birth year] Age: 73 Gender: M Reported: 06/25/2018 [address] Patient Ph: [phone] MRN #: [id number] [place], [place] [address] Visit #: [id number].[place]-ABA0 Chart #: Phone: NULL Fax: CC: Catha Gosselin, MD Miguel Bathe, MD REPORT OF SURGICAL PATHOLOGY FINAL DIAGNOSIS Diagnosis 1. Lung, resection (segmental or lobe), left upper lobe - ADENOCARCINOMA, 2.3 CM. -  THREE BENIGN LYMPH NODES (0/3). - MARGINS NOT INVOLVED. 2. Lymph node, biopsy, 10 Wolfe node - ANTHRACOTIC LYMPH NODE. - NO METASTATIC CARCINOMA IDENTIFIED. 3. Lymph node, biopsy, 10 Wolfe #2 - ANTHRACOTIC LYMPH NODE. - NO METASTATIC CARCINOMA IDENTIFIED. 4. Lymph node, biopsy, 11 Wolfe node - ANTHRACOTIC LYMPH NODE. - NO  METASTATIC CARCINOMA IDENTIFIED. 5. Lymph node, biopsy, 4 Wolfe node - ANTHRACOTIC LYMPH NODE. - NO METASTATIC CARCINOMA IDENTIFIED. 6. Lymph node, biopsy, 4 Wolfe #2 node - ANTHRACOTIC LYMPH NODE. - NO METASTATIC CARCINOMA IDENTIFIED. 7. Lymph node, biopsy, 11 Wolfe #2 node - ANTHRACOTIC LYMPH NODE. - NO METASTATIC CARCINOMA IDENTIFIED. 8. Lymph node, biopsy, 12 Wolfe node - ANTHRACOTIC LYMPH NODE. - NO METASTATIC CARCINOMA IDENTIFIED. 9. Lymph node, biopsy, 12 Wolfe #2 node - ANTHRACOTIC LYMPH NODE. - NO METASTATIC CARCINOMA IDENTIFIED. 10. Lymph node, biopsy, Level 8 Paraesophageal - ANTHRACOTIC LYMPH NODE. 1 of 3 FINAL for Miguel Wolfe ([id number]) Diagnosis(continued) - NO METASTATIC CARCINOMA IDENTIFIED. 11. Lymph node, biopsy, 4 Wolfe #3 node - ANTHRACOTIC LYMPH NODE. - NO METASTATIC CARCINOMA IDENTIFIED. Microscopic Comment 1. LUNG: Procedure: Left upper lobectomy and ten additional lymph node biopsies. Specimen Laterality: Left. Tumor Site: Left upper lobe. Tumor Size: 2.3 x 2.0 x 1.7 cm. Total Tumor Size Inclusive of Invasive and Lepidic Components (applies only to invasive nonmucinous adenocarcinoma with a lepidic component): 2.3 cm. Invasive Tumor Size (applies only to invasive nonmucinous adenocarcinoma with a lepidic component: 2.3 cm. Tumor Focality: Unifocal. Histologic Type: Adenocarcinoma. Visceral Pleura Invasion: No. Lymphovascular Invasion: No. Direct Invasion of Adjacent Structures: N/A. Margins: Free of tumor. Treatment Effect: No. Regional Lymph Nodes: Number of Lymph Nodes Involved: 0. Number of Lymph Nodes Examined: 13. Pathologic Stage Classification (pTNM, AJCC 8th Edition): pTic, pN0. Ancillary Studies: Foundation 1 and PDL-1 pending. Representative Tumor Block: 1E and 20F. (JDP:ah 06/25/18) (v4.0.0.3) Miguel Picket MD Pathologist, Electronic Signature (Case signed 06/25/2018) Intraoperative Diagnosis 1. LEFT UPPER LOBE: - A) BRONCHIAL MARGIN: UNINVOLVED BY  CARCINOMA - B) LESION: ADENOCARCINOMA. (DB) Specimen Gross and Clinical Information Specimen(s) Obtained: 1. Lung, resection (segmental or lobe), left upper lobe 2. Lymph node, biopsy, 10 Wolfe node 3. Lymph node, biopsy, 10 Wolfe #2 2 of 3 FINAL for Causey, Bari Wolfe ([id number]) Specimen(s) Obtained:(continued) 4. Lymph node, biopsy, 11 Wolfe node 5. Lymph node, biopsy, 4 Wolfe node 6. Lymph node, biopsy, 4 Wolfe #2 node 7. Lymph node, biopsy, 11 Wolfe #2 node 8. Lymph node, biopsy, 12 Wolfe node 9. Lymph node, biopsy, 12 Wolfe #2 node 10. Lymph node, biopsy, Level 8 Paraesophageal 11. Lymph node, biopsy, 4 Wolfe #3 node Specimen Clinical Information 1. LUL nodule (kp) Gross 1. Received fresh for intraoperative consult is a 210 gram, 21.5 x 13 x 4 cm lung lobe. The pleural surface is smooth, blue gray, intact. Upon sectioning, there is an ill defined 2.3 x 2 x 1.7 cm tan mass at the apex, 7 cm to the bronchial and vascular margins. The mass is 1.8 cm to the pleural surface. The mass surrounds prominent vessels and airways. The remaining cut surfaces are spongy, red brown, and additional lesions are not grossly seen. Representative sections are as follows: A = bronchial margin en face, frozen section remnant B = mass, frozen section remnant C = vascular margins en face D = lobar lymph nodes E-F = mass G = normal parenchyma, peripheral sections H = normal parenchyma, central sections including intraparenchymal lymph node. 2. Received in saline is a 0.5 cm anthracotic lymph node, submitted in block  A. 3. Received in saline is a 1 x 0.5 x 0.3 cm anthracotic lymph node, bisected and submitted in block A. 4. Received in saline is a 1 x 1 x 0.3 cm anthracotic lymph node, trisected and submitted in one cassette. 5. Received in saline is a 1 x 0.6 x 0.2 cm tan-pink anthracotic soft tissue fragment, submitted in block A. 6. Received in saline is a 1.5 x 1.5 x 0.2 cm aggregate of anthracotic lymph node fragments, submitted in  block A. 7. Received in saline is a 0.6 cm anthracotic lymph node fragment, submitted in block A. 8. Received in saline is a 1 cm anthracotic lymph node fragment, bisected and submitted in one cassette. 9. Received in saline is a 1.1 cm anthracotic lymph node fragment, sectioned and submitted in block A. 10. Received in saline is a 0.4 cm anthracotic lymph node fragment, submitted in block A. 11. Received in saline is a 0.8 cm anthracotic lymph node fragment, submitted in block A. (AK:ah 06/24/18)   Treatments:  NAME: HUCKSTON, LAINO. 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  SURGICAL PROCEDURE:  Left video-assisted thoracoscopy, left upper lobectomy with lymph node dissection, and intercostal nerve block with ____.  SURGEON:  Sheliah Plane, MD  FIRST ASSISTANT:  Jari Favre, PA  Discharge Exam: Blood pressure 140/87, pulse (!) 102, temperature 98.5 F (36.9 C), temperature source Oral, resp. rate 14, height 5\' 11"  (1.803 m), weight 74.1 kg, SpO2 99 %.   General appearance: alert, cooperative and no distress Heart: regular rate and rhythm Lungs: clear to auscultation bilaterally Abdomen: soft, non-tender; bowel sounds normal; no masses,  no organomegaly Extremities: extremities normal, atraumatic, no cyanosis or edema Wound: clean and dry   Discharge disposition: 01-Home or Self Care    Allergies as of 06/30/2018      Reactions   Gemfibrozil Other (See Comments)   Muscle pain   Niaspan [niacin Er] Rash, Other (See Comments)   Flushing - aspirin did not mitigate    Simvastatin Other (See Comments)   Drug-Drug interaction with amlodipine      Medication List    TAKE these  medications   acetaminophen 325 MG tablet Commonly known as: TYLENOL Take 650 mg by mouth every 6 (six) hours as needed for moderate pain or headache.   amLODipine 10 MG tablet Commonly known as: NORVASC Take 10 mg by mouth daily.   aspirin EC 81 MG tablet Take 81 mg by mouth every evening.   chlorhexidine 0.12 % solution Commonly known as: PERIDEX Use as directed 15 mLs in the mouth or throat 2 (two) times daily.   ibuprofen 200 MG tablet Commonly known as: ADVIL Take 400 mg by mouth every 8 (eight) hours as needed for headache or moderate pain.   isosorbide mononitrate 60 MG 24 hr tablet Commonly known as: IMDUR Take 1 tablet (60 mg total) by mouth daily.   levothyroxine 137 MCG tablet Commonly known as: SYNTHROID Take 137 mcg by mouth daily before breakfast.   losartan 100 MG tablet Commonly known as: COZAAR Take 100 mg by mouth daily.   nitroGLYCERIN 0.4 MG SL tablet Commonly known as: NITROSTAT Place 1 tablet (0.4 mg total) under the tongue every 5 (five) minutes as needed for chest pain.  omega-3 acid ethyl esters 1 g capsule Commonly known as: LOVAZA Take 2 g by mouth 2 (two) times daily.   ondansetron 4 MG tablet Commonly known as: ZOFRAN Take 1 tablet (4 mg total) by mouth every 6 (six) hours as needed for nausea or vomiting.   pantoprazole 40 MG tablet Commonly known as: PROTONIX TAKE 1 TABLET(40 MG) BY MOUTH DAILY What changed: See the new instructions.   propranolol 20 MG tablet Commonly known as: INDERAL Take 20 mg by mouth daily.   rosuvastatin 20 MG tablet Commonly known as: CRESTOR TAKE 1 TABLET(20 MG) BY MOUTH DAILY What changed: See the new instructions.   traMADol 50 MG tablet Commonly known as: ULTRAM Take 1-2 tablets (50-100 mg total) by mouth every 6 (six) hours as needed. What changed: Another medication with the same name was added. Make sure you understand how and when to take each.   traMADol 50 MG tablet Commonly known as:  ULTRAM Take 1-2 tablets (50-100 mg total) by mouth every 6 (six) hours as needed (mild pain). What changed: You were already taking a medication with the same name, and this prescription was added. Make sure you understand how and when to take each.   traZODone 100 MG tablet Commonly known as: DESYREL Take 100 mg by mouth at bedtime as needed for sleep.   zolpidem 10 MG tablet Commonly known as: AMBIEN Take 10 mg by mouth at bedtime as needed for sleep.      Follow-up Information    Little, Caryn Bee, MD. Call in 1 day(s).   Specialty: Family Medicine Contact information: [address] [place] [address] [phone]        Miguel Bathe, MD .   Specialty: Cardiology Contact information: [phone] [address] [place] [address] [phone]        Delight Ovens, MD Follow up.   Specialty: Cardiothoracic Surgery Why: Your routine follow-up appointment is on 07/24/2018 @ 2pm.  Please arrive at 1:30 PM for a chest x-ray located at [facility] imaging which is on the first floor of our building. Contact information: [address] [place] [address] [phone]        [facility]  Follow up on 07/07/2018.   Specialty: Cardiothoracic Surgery Why: Appointment is at 10:00 with nurse for suture removal Contact information: [address] [place], [address] [place] [address] [phone]          Signed:  Note By Jari Favre PA-C  Update by:  Lowella Dandy, PA-C  06/30/2018, 8:23 AM

## 2018-06-26 NOTE — Progress Notes (Signed)
Patient ambulated to the unit with RN. Patient alert and oriented. Chest tube to water seal x 1. Patient repositioned and sitting in chair.

## 2018-06-26 NOTE — Op Note (Signed)
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  SURGICAL PROCEDURE:  Left video-assisted thoracoscopy, left upper lobectomy with lymph node dissection, and intercostal nerve block.  SURGEON:  Lanelle Bal, MD  FIRST ASSISTANT:  Nicholes Rough, PA  BRIEF HISTORY:  The patient is a 73 year old male who originally came to the attention of thoracic surgery after a CT scan of the chest was done by cardiology for chest discomfort.  The patient at that time was incidentally found to have multiple  ground-glass opacities in the left upper lobe, right upper lobe, and right lower lobe.  There was a slight solid component to the left upper lobe.  In addition, a PET scan suggested thyroid nodule hypermetabolic.  Further evaluation in November of 2019  confirmed adenocarcinoma of the thyroid.  Navigation bronchoscopy to the left upper lobe, right upper lobe and right lower lobe lesions was performed without any definitive tissue diagnosis.  The patient went on to have his thyroid carcinoma resected.   Followup scans showed resolution of the right lower lobe area in question, but persistent ground-glass opacity in the right upper lobe and partial ground glass, partial solid lesion in the left upper lobe.  Consideration for resection of the left upper  lobe was discussed with the patient, but he preferred to obtain followup scan.  In May 2020, the scan was done and showed little change in size, possible subtle increase in density of the left upper lobe.  Again, it was recommended to proceed with left  upper lobectomy; however, the patient remained reluctant but did agree to repeat navigation bronchoscopy.   At the time of this biopsy, pathology did confirm adenocarcinoma of the left upper lobe, and the patient agreed to proceed with left upper  lobectomy.  Risks and options were discussed with him in detail, and he was agreeable and signed informed consent.  DESCRIPTION OF PROCEDURE:  The patient underwent general endotracheal anesthesia without incident.  A double-lumen endotracheal tube was placed.  The patient then had the left lung isolated and was turned in a lateral decubitus position with the left  side up.  The left side had preoperatively been marked.  Appropriate timeout confirming the patient's radiographic studies and preoperative marking was done.  The left chest was prepped with Betadine, draped in a sterile manner.  A 5 mm 30-degree scope  was introduced through a small port site approximately 6th intercostal space.  It took some time for the lung to totally collapse, but this was accomplished.  A small utilitarian incision was made slightly above the port site.  Through this incision then  the chest was carefully examined.  The fissure was relatively complete.  We started anteriorly, rotating the lung upper lobe posteriorly, dissecting along the left upper lobe pulmonary vein.  This was encircled and dissected free, and a vascular stapler  was placed through an additional port site and used to divide the pulmonary vein.  We then rolled the lung and upper lobe forward and dissected out 3 pulmonary branches to the left upper lobe and a branch to the lingula.  The 4 pulmonary artery branches  involving the upper lobe were then each divided individually with a powered vascular stapler.  We then completed the fissures with  a gold load 45 Endo stapler.  The bronchus was encircled, and a green load 45 stapler was placed across the left upper  lobe bronchus and clamped.  Inflation of the lower lobe was tested and successful.  The bronchus was then stapled.  The specimen was placed in a specimen bag and  brought out through the incision and submitted to pathology, both to check margins and also  to confirm the mass in the lower lobe.  We then proceeded with lymph node dissection, removing nodes from 4L, 10L, 11L, 12L, and 8 areas.  The inferior pulmonary ligament was divided to mobilize the lower lobe.  The bronchus was tested for air leaks, and  there was no air leak.  A standard [place] 28 chest tube was placed through the anterior port and a 28 Blake drain through the more posterior port and secured in place.  Small holes were drilled in the lower rib at the incision site, and pericostal  sutures were placed around the ribs and through the drill hole in the rib.  Muscle was closed with interrupted 0 Vicryl, running 2-0 Vicryl subcutaneous tissue, and 3-0 subcuticular stitch on the skin edges.  The lung reinflated nicely.  At the beginning  of the case after the scope was placed into the chest, using a mixture of saline, bupivacaine liposome  5-8 mL was injected along each intercostal space above the operative field and below with a spinal needle.  At the completion of the case, sponge and needle count  was reported as correct.  RF tag scanning reported clear code.  Estimated blood loss was less than 100 mL.  The patient did not require any blood bank blood products.  He was extubated in the operating room and transferred to the recovery room for  further postoperative care, having tolerated the procedure without complication.  LN/NUANCE  D:06/25/2018 T:06/26/2018 JOB:[id number]

## 2018-06-26 NOTE — Plan of Care (Signed)

## 2018-06-26 NOTE — Progress Notes (Addendum)
[facility] DAILY ICU PROGRESS NOTE                   [address]            [place] [address]          [phone]   2 Days Post-Op Procedure(s) (LRB): VIDEO ASSISTED THORACOSCOPY (VATS)/LUNG RESECTION (Left)  Total Length of Stay:  LOS: 2 days   Subjective: Feels okay this morning. Some discomfort around the chest tube site. More sore this morning than yesterday.  Objective: Vital signs in last 24 hours: Temp:  [97.7 F (36.5 C)-98.9 F (37.2 C)] 97.7 F (36.5 C) (06/18 0400) Pulse Rate:  [93] 93 (06/17 0800) Cardiac Rhythm: Normal sinus rhythm (06/17 2000) Resp:  [10-21] 17 (06/18 0600) BP: (111-148)/(73-86) 136/84 (06/18 0600) SpO2:  [35 %-99 %] 94 % (06/18 0500)  Filed Weights   06/24/18 0553 06/25/18 0600  Weight: 79.2 kg 77.7 kg    Weight change:       Intake/Output from previous day: 06/17 0701 - 06/18 0700 In: 735.4 [P.O.:400; I.V.:235.4; IV Piggyback:100.1] Out: 2020 [Urine:1900; Chest Tube:120]  Intake/Output this shift: No intake/output data recorded.  Current Meds: Scheduled Meds: . acetaminophen  1,000 mg Oral Q6H   Or  . acetaminophen (TYLENOL) oral liquid 160 mg/5 mL  1,000 mg Oral Q6H  . amLODipine  10 mg Oral Daily  . aspirin EC  81 mg Oral QPM  . bisacodyl  10 mg Oral Daily  . Chlorhexidine Gluconate Cloth  6 each Topical Daily  . enoxaparin (LOVENOX) injection  40 mg Subcutaneous Daily  . fentaNYL   Intravenous Q4H  . insulin aspart  0-24 Units Subcutaneous Q4H  . levothyroxine  137 mcg Oral QAC breakfast  . mouth rinse  15 mL Mouth Rinse BID  . pantoprazole  40 mg Oral Daily  . potassium chloride  20 mEq Oral TID  . propranolol  20 mg Oral Daily  . rosuvastatin  20 mg Oral Daily  . senna-docusate  1 tablet Oral QHS   Continuous Infusions: . dextrose 5 % and 0.45% NaCl 10 mL/hr at 06/25/18 0823  . potassium chloride     PRN Meds:.alum & mag hydroxide-simeth, diphenhydrAMINE **OR** diphenhydrAMINE, naloxone **AND**  sodium chloride flush, ondansetron (ZOFRAN) IV, ondansetron (ZOFRAN) IV, oxyCODONE, potassium chloride, traMADol, zolpidem  General appearance: alert, cooperative and no distress Heart: regular rate and rhythm, S1, S2 normal, no murmur, click, rub or gallop Lungs: clear to auscultation bilaterally Abdomen: soft, non-tender; bowel sounds normal; no masses,  no organomegaly Extremities: extremities normal, atraumatic, no cyanosis or edema Wound: clean and dry  Lab Results: CBC: Recent Labs    06/25/18 0430 06/25/18 0536 06/26/18 0426  WBC 20.1*  --  13.5*  HGB 14.4 15.0 14.7  HCT 43.3 44.0 44.4  PLT 166  --  164   BMET:  Recent Labs    06/25/18 0430 06/25/18 0536 06/26/18 0426  NA 136 137 139  K 3.3* 3.2* 3.7  CL 99  --  101  CO2 26  --  29  GLUCOSE 159*  --  114*  BUN 7*  --  9  CREATININE 0.67  --  0.63  CALCIUM 8.2*  --  8.6*    CMET: Lab Results  Component Value Date   WBC 13.5 (H) 06/26/2018   HGB 14.7 06/26/2018   HCT 44.4 06/26/2018   PLT 164 06/26/2018   GLUCOSE 114 (H) 06/26/2018   CHOL 151  01/21/2017   TRIG 177 (H) 01/21/2017   HDL 48 01/21/2017   LDLCALC 68 01/21/2017   ALT 17 06/26/2018   AST 31 06/26/2018   NA 139 06/26/2018   K 3.7 06/26/2018   CL 101 06/26/2018   CREATININE 0.63 06/26/2018   BUN 9 06/26/2018   CO2 29 06/26/2018   TSH 10.736 (H) 01/24/2018   INR 1.1 06/20/2018   HGBA1C 5.9 (H) 06/09/2018      PT/INR: No results for input(s): LABPROT, INR in the last 72 hours. Radiology: No results found.   Assessment/Plan: S/P Procedure(s) (LRB): VIDEO ASSISTED THORACOSCOPY (VATS)/LUNG RESECTION (Left)  1. CV-NSR in the 80s-90s. BP well controlled-On Norvasc.  2. Pulm-tolerating 2L [place] with good oxygen saturation. CXR stable this morning-will await official read. Chest tubes put out 120cc/since surgery.  3. Renal-hypokalemia- replacing. Creatinine 0.67. 4. H and H 14.7/44.4, expected acute blood loss anemia.  5. Endo-blood glucose  well controlled 6. Continue Lovenox for DVT prophylaxis 7. Anxiety-Added home dose of propanolol.   Plan: Discontinue central line and anterior chest tube. Continue to encourage hourly incentive spirometer use. Possibly remove posterior chest tube tomorrow and home likely this weekend.      Elgie Collard 06/26/2018 7:38 AM'  Very small air leak with cough from posterior chest tube, plan removal of anterior chest tube today Again discuss path results with the patient I have seen and examined Miguel Wolfe and agree with the above assessment  and plan.  Grace Isaac MD Beeper [phone] Office [phone] 06/26/2018 8:11 AM

## 2018-06-27 ENCOUNTER — Inpatient Hospital Stay (HOSPITAL_COMMUNITY): Payer: Medicare Other

## 2018-06-27 LAB — GLUCOSE, CAPILLARY
Glucose-Capillary: 105 mg/dL — ABNORMAL HIGH (ref 70–99)
Glucose-Capillary: 120 mg/dL — ABNORMAL HIGH (ref 70–99)
Glucose-Capillary: 106 mg/dL — ABNORMAL HIGH (ref 70–99)
Glucose-Capillary: 153 mg/dL — ABNORMAL HIGH (ref 70–99)
Glucose-Capillary: 132 mg/dL — ABNORMAL HIGH (ref 70–99)
Glucose-Capillary: 98 mg/dL (ref 70–99)

## 2018-06-27 LAB — CBC
HCT: 47.8 % (ref 39.0–52.0)
Hemoglobin: 16 g/dL (ref 13.0–17.0)
MCH: 30.2 pg (ref 26.0–34.0)
MCHC: 33.5 g/dL (ref 30.0–36.0)
MCV: 90.2 fL (ref 80.0–100.0)
Platelets: 190 10*3/uL (ref 150–400)
RBC: 5.3 MIL/uL (ref 4.22–5.81)
RDW: 12.1 % (ref 11.5–15.5)
WBC: 9.9 10*3/uL (ref 4.0–10.5)
nRBC: 0 % (ref 0.0–0.2)

## 2018-06-27 LAB — BASIC METABOLIC PANEL
Anion gap: 11 (ref 5–15)
BUN: 18 mg/dL (ref 8–23)
CO2: 29 mmol/L (ref 22–32)
Calcium: 8.9 mg/dL (ref 8.9–10.3)
Chloride: 96 mmol/L — ABNORMAL LOW (ref 98–111)
Creatinine, Ser: 0.78 mg/dL (ref 0.61–1.24)
GFR calc Af Amer: >60 mL/min (ref >60)
GFR calc non Af Amer: >60 mL/min (ref >60)
Glucose, Bld: 161 mg/dL — ABNORMAL HIGH (ref 70–99)
Potassium: 3.2 mmol/L — ABNORMAL LOW (ref 3.5–5.1)
Sodium: 136 mmol/L (ref 135–145)

## 2018-06-27 MED ORDER — SODIUM CHLORIDE 0.9% FLUSH
3.0000 mL | Freq: Two times a day (BID) | INTRAVENOUS | Status: DC
  Administered 2018-06-27 – 2018-06-29 (×6): 3 mL via INTRAVENOUS

## 2018-06-27 MED ORDER — MOVING RIGHT ALONG BOOK
Freq: Once | Status: DC
  Filled 2018-06-27: qty 1

## 2018-06-27 MED ORDER — SODIUM CHLORIDE 0.9 % IV SOLN: 250.0000 mL | INTRAVENOUS | Status: DC | PRN

## 2018-06-27 MED ORDER — SODIUM CHLORIDE 0.9% FLUSH: 3.0000 mL | INTRAVENOUS | Status: DC | PRN

## 2018-06-27 NOTE — Progress Notes (Addendum)
      [address]       [place] [address]             [phone]      3 Days Post-Op Procedure(s) (LRB): VIDEO ASSISTED THORACOSCOPY (VATS)/LUNG RESECTION (Left) Subjective: Feels okay this morning. Disappointed that his chest tube cannot come out today.   Objective: Vital signs in last 24 hours: Temp:  [97.4 F (36.3 C)-98.7 F (37.1 C)] 97.7 F (36.5 C) (06/19 0737) Pulse Rate:  [65-93] 93 (06/19 0737) Cardiac Rhythm: Normal sinus rhythm (06/19 0737) Resp:  [10-18] 15 (06/19 0737) BP: (121-158)/(69-79) 158/77 (06/19 0737) SpO2:  [93 %-99 %] 95 % (06/19 0737) Weight:  [75 kg] 75 kg (06/18 1824)     Intake/Output from previous day: 06/18 0701 - 06/19 0700 In: 353.5 [P.O.:200; I.V.:153.5] Out: 150  Intake/Output this shift: Total I/O In: 240 [P.O.:240] Out: -   General appearance: alert, cooperative and no distress Heart: regular rate and rhythm, S1, S2 normal, no murmur, click, rub or gallop Lungs: clear to auscultation bilaterally Abdomen: soft, non-tender; bowel sounds normal; no masses,  no organomegaly Extremities: extremities normal, atraumatic, no cyanosis or edema Wound: clean and dry  Lab Results: Recent Labs    06/25/18 0430 06/25/18 0536 06/26/18 0426  WBC 20.1*  --  13.5*  HGB 14.4 15.0 14.7  HCT 43.3 44.0 44.4  PLT 166  --  164   BMET:  Recent Labs    06/25/18 0430 06/25/18 0536 06/26/18 0426  NA 136 137 139  K 3.3* 3.2* 3.7  CL 99  --  101  CO2 26  --  29  GLUCOSE 159*  --  114*  BUN 7*  --  9  CREATININE 0.67  --  0.63  CALCIUM 8.2*  --  8.6*    PT/INR: No results for input(s): LABPROT, INR in the last 72 hours. ABG    Component Value Date/Time   PHART 7.409 06/25/2018 0536   HCO3 28.7 (H) 06/25/2018 0536   TCO2 30 06/25/2018 0536   O2SAT 96.0 06/25/2018 0536   CBG (last 3)  Recent Labs    06/26/18 2020 06/26/18 2340 06/27/18 0341  GLUCAP 127* 92 105*    Assessment/Plan: S/P Procedure(s) (LRB):  VIDEO ASSISTED THORACOSCOPY (VATS)/LUNG RESECTION (Left)  1. CV-NSR in the 80s-90s. BP a little elevated this morning-On home dose of Norvasc. Will add losartan if continues to be elevated.   2. Pulm-tolerating room air with good oxygen saturation. CXR stable this morning-will await official read. Chest tubes put out 40cc/over night. Posterior chest tube still in place on water seal but with an air leak.  3. Renal-hypokalemia- replacing. Creatinine 0.63. 4. H and H 14.7/44.4, expected acute blood loss anemia.  5. Endo-blood glucose well controlled 6. Continue Lovenox for DVT prophylaxis 7. Anxiety-continue home dose of propanolol.  Plan: Keep remaining chest tube hopefully one more day. Hopefully can remove tomorrow. Continue to use incentive spirometer and will add a flutter valve. Likely home Sunday/monday.    LOS: 3 days    Elgie Collard 06/27/2018  Small air leak with cough today, leave chest tube today reevaluate tomorrow  I have seen and examined Miguel Wolfe and agree with the above assessment  and plan.  Grace Isaac MD Beeper [phone] Office [phone] 06/27/2018 3:55 PM

## 2018-06-27 NOTE — Care Management Important Message (Signed)
Important Message  Patient Details  Name: Miguel Wolfe MRN: [id number] Date of Birth: 25-Feb-1945   Medicare Important Message Given:  Yes     Memory Argue 06/27/2018, 4:16 PM

## 2018-06-27 NOTE — Progress Notes (Signed)
Chest tube to water seal; approximately 40 mL of sanguineous output overnight. Minimal air leak present RN will continue to monitor.

## 2018-06-27 NOTE — Progress Notes (Signed)
Received order however pt is n/a for CR services. Will not follow. Yves Dill CES, ACSM 9:49 AM 06/27/2018

## 2018-06-28 ENCOUNTER — Inpatient Hospital Stay (HOSPITAL_COMMUNITY): Payer: Medicare Other

## 2018-06-28 LAB — GLUCOSE, CAPILLARY
Glucose-Capillary: 85 mg/dL (ref 70–99)
Glucose-Capillary: 129 mg/dL — ABNORMAL HIGH (ref 70–99)
Glucose-Capillary: 110 mg/dL — ABNORMAL HIGH (ref 70–99)
Glucose-Capillary: 101 mg/dL — ABNORMAL HIGH (ref 70–99)
Glucose-Capillary: 113 mg/dL — ABNORMAL HIGH (ref 70–99)

## 2018-06-28 MED ORDER — POLYETHYLENE GLYCOL 3350 17 G PO PACK
17.0000 g | PACK | Freq: Every day | ORAL | Status: DC
  Administered 2018-06-28 – 2018-06-29 (×2): 17 g via ORAL
  Filled 2018-06-28 (×2): qty 1

## 2018-06-28 MED ORDER — INSULIN ASPART 100 UNIT/ML ~~LOC~~ SOLN: 0.0000 [IU] | SUBCUTANEOUS | Status: DC

## 2018-06-28 MED ORDER — INSULIN ASPART 100 UNIT/ML ~~LOC~~ SOLN
0.0000 [IU] | Freq: Three times a day (TID) | SUBCUTANEOUS | Status: DC
  Administered 2018-06-29 (×2): 2 [IU] via SUBCUTANEOUS

## 2018-06-28 NOTE — Progress Notes (Addendum)
[address]       [place] [address]             [phone]      4 Days Post-Op Procedure(s) (LRB): VIDEO ASSISTED THORACOSCOPY (VATS)/LUNG RESECTION (Left) Subjective: Feels pretty well, some indigestion and constipation   Objective: Vital signs in last 24 hours: Temp:  [97.7 F (36.5 C)-98.4 F (36.9 C)] 97.9 F (36.6 C) (06/20 1100) Pulse Rate:  [63-78] 74 (06/20 1100) Cardiac Rhythm: Normal sinus rhythm (06/20 0800) Resp:  [12-17] 12 (06/20 1100) BP: (123-144)/(68-83) 132/78 (06/20 1100) SpO2:  [96 %-100 %] 97 % (06/20 1100) Weight:  [74.5 kg] 74.5 kg (06/20 0357)  Hemodynamic parameters for last 24 hours:    Intake/Output from previous day: 06/19 0701 - 06/20 0700 In: 800 [P.O.:800] Out: -  Intake/Output this shift: No intake/output data recorded.  General appearance: alert, cooperative and no distress Heart: regular rate and rhythm Lungs: clear to auscultation bilaterally Abdomen: benign Extremities: no edema or calf tenderness Wound: incis healing well, some echymosis  Lab Results: Recent Labs    06/26/18 0426 06/27/18 0957  WBC 13.5* 9.9  HGB 14.7 16.0  HCT 44.4 47.8  PLT 164 190   BMET:  Recent Labs    06/26/18 0426 06/27/18 0957  NA 139 136  K 3.7 3.2*  CL 101 96*  CO2 29 29  GLUCOSE 114* 161*  BUN 9 18  CREATININE 0.63 0.78  CALCIUM 8.6* 8.9    PT/INR: No results for input(s): LABPROT, INR in the last 72 hours. ABG    Component Value Date/Time   PHART 7.409 06/25/2018 0536   HCO3 28.7 (H) 06/25/2018 0536   TCO2 30 06/25/2018 0536   O2SAT 96.0 06/25/2018 0536   CBG (last 3)  Recent Labs    06/28/18 0359 06/28/18 0734 06/28/18 1101  GLUCAP 85 129* 110*    Meds Scheduled Meds: . acetaminophen  1,000 mg Oral Q6H   Or  . acetaminophen (TYLENOL) oral liquid 160 mg/5 mL  1,000 mg Oral Q6H  . amLODipine  10 mg Oral Daily  . aspirin EC  81 mg Oral QPM  . bisacodyl  10 mg Oral Daily  .  Chlorhexidine Gluconate Cloth  6 each Topical Daily  . enoxaparin (LOVENOX) injection  40 mg Subcutaneous Daily  . insulin aspart  0-24 Units Subcutaneous Q4H  . levothyroxine  137 mcg Oral QAC breakfast  . mouth rinse  15 mL Mouth Rinse BID  . moving right along book   Does not apply Once  . pantoprazole  40 mg Oral Daily  . propranolol  20 mg Oral Daily  . rosuvastatin  20 mg Oral Daily  . senna-docusate  1 tablet Oral QHS  . sodium chloride flush  3 mL Intravenous Q12H   Continuous Infusions: . sodium chloride    . dextrose 5 % and 0.45% NaCl 10 mL/hr at 06/25/18 0823   PRN Meds:.sodium chloride, alum & mag hydroxide-simeth, diphenhydrAMINE **OR** diphenhydrAMINE, naloxone **AND** sodium chloride flush, ondansetron (ZOFRAN) IV, ondansetron (ZOFRAN) IV, oxyCODONE, sodium chloride, sodium chloride flush, traMADol, zolpidem  Xrays Dg Chest 2 View  Result Date: 06/27/2018 CLINICAL DATA:  Post op  Chest  tube EXAM: CHEST - 2 VIEW COMPARISON:  the previous day's study FINDINGS: Removal of 1 of 2 left chest tubes, the remaining 1 directed towards the apex as before. No pneumothorax evident. Linear scarring or atelectasis in the right middle lobe. Removal of right  IJ central line. Surgical clips at the thoracic inlet. Heart size normal. Aortic Atherosclerosis (ICD10-170.0). No effusion. IMPRESSION: Removal of 1 of 2 left chest tubes without pneumothorax. Electronically Signed   By: Lucrezia Europe M.D.   On: 06/27/2018 09:45    Assessment/Plan: S/P Procedure(s) (LRB): VIDEO ASSISTED THORACOSCOPY (VATS)/LUNG RESECTION (Left)   1 overall doing well, hemodyn stable in sinus rhythm 2 no air leak noted, some tidaling in tube, will check CXR and poss d/c tube today 3 sats good on RA- cont routime pulm toilet 4 BS controlled 5 no new labs 6 poss home in am  LOS: 4 days    AVERI CACIOPPO  [facility] 06/28/2018 Pager [phone]  I have seen and examined the patient and agree with the assessment and  plan as outlined.  Dr Servando Snare left instructions for tube to remain at least until Sunday due to significant air leak noticed yesterday.   CXR looks good and no air leak noted today.  Possibly D/C tube tomorrow then D/C home on Monday  Rexene Alberts, MD 06/28/2018 12:31 PM

## 2018-06-28 NOTE — Plan of Care (Signed)
Pt is ambulating in a hallway, denies SOB and chest pain but tramadol given for a surgical site pain.  Palma Holter, RN

## 2018-06-28 NOTE — Progress Notes (Signed)
Total chest tube output in 7 am to 7Pm shift 100cc, it is water seal, pt is ambulatory in a hallway, taking pain medicine around the clock for surgical site pain, new dressing changed  Palma Holter, RN

## 2018-06-29 ENCOUNTER — Inpatient Hospital Stay (HOSPITAL_COMMUNITY): Payer: Medicare Other

## 2018-06-29 LAB — GLUCOSE, CAPILLARY
Glucose-Capillary: 100 mg/dL — ABNORMAL HIGH (ref 70–99)
Glucose-Capillary: 110 mg/dL — ABNORMAL HIGH (ref 70–99)
Glucose-Capillary: 136 mg/dL — ABNORMAL HIGH (ref 70–99)
Glucose-Capillary: 122 mg/dL — ABNORMAL HIGH (ref 70–99)

## 2018-06-29 MED ORDER — ACETAMINOPHEN 325 MG PO TABS
650.0000 mg | ORAL_TABLET | Freq: Four times a day (QID) | ORAL | Status: DC | PRN
  Administered 2018-06-29 – 2018-06-30 (×2): 650 mg via ORAL
  Filled 2018-06-29: qty 2

## 2018-06-29 NOTE — Progress Notes (Signed)
[address]       [place] [address]             [phone]      5 Days Post-Op Procedure(s) (LRB): VIDEO ASSISTED THORACOSCOPY (VATS)/LUNG RESECTION (Left) Subjective: Looks good overall, feels well Occas sinus tachy  Objective: Vital signs in last 24 hours: Temp:  [97.5 F (36.4 C)-98.2 F (36.8 C)] 98.2 F (36.8 C) (06/21 1102) Pulse Rate:  [75-95] 81 (06/21 1102) Cardiac Rhythm: Sinus tachycardia (06/21 0700) Resp:  [11-17] 12 (06/21 1102) BP: (116-135)/(72-98) 135/90 (06/21 1102) SpO2:  [95 %-99 %] 97 % (06/21 1102) Weight:  [74.2 kg] 74.2 kg (06/21 0431)  Hemodynamic parameters for last 24 hours:    Intake/Output from previous day: 06/20 0701 - 06/21 0700 In: 283 [P.O.:280; I.V.:3] Out: 20 [Chest Tube:20] Intake/Output this shift: Total I/O In: 280 [P.O.:280] Out: -   General appearance: alert, cooperative and no distress Heart: regular rate and rhythm Lungs: clear to auscultation bilaterally Abdomen: benign Extremities: no edema or calf tenderness Wound: incis echymotic, no signs of infection  Lab Results: Recent Labs    06/27/18 0957  WBC 9.9  HGB 16.0  HCT 47.8  PLT 190   BMET:  Recent Labs    06/27/18 0957  NA 136  K 3.2*  CL 96*  CO2 29  GLUCOSE 161*  BUN 18  CREATININE 0.78  CALCIUM 8.9    PT/INR: No results for input(s): LABPROT, INR in the last 72 hours. ABG    Component Value Date/Time   PHART 7.409 06/25/2018 0536   HCO3 28.7 (H) 06/25/2018 0536   TCO2 30 06/25/2018 0536   O2SAT 96.0 06/25/2018 0536   CBG (last 3)  Recent Labs    06/28/18 1605 06/28/18 2123 06/29/18 0606  GLUCAP 101* 113* 100*    Meds Scheduled Meds: . acetaminophen  1,000 mg Oral Q6H   Or  . acetaminophen (TYLENOL) oral liquid 160 mg/5 mL  1,000 mg Oral Q6H  . amLODipine  10 mg Oral Daily  . aspirin EC  81 mg Oral QPM  . bisacodyl  10 mg Oral Daily  . Chlorhexidine Gluconate Cloth  6 each Topical Daily  .  enoxaparin (LOVENOX) injection  40 mg Subcutaneous Daily  . insulin aspart  0-24 Units Subcutaneous TID AC & HS  . levothyroxine  137 mcg Oral QAC breakfast  . mouth rinse  15 mL Mouth Rinse BID  . moving right along book   Does not apply Once  . pantoprazole  40 mg Oral Daily  . polyethylene glycol  17 g Oral Daily  . propranolol  20 mg Oral Daily  . rosuvastatin  20 mg Oral Daily  . senna-docusate  1 tablet Oral QHS  . sodium chloride flush  3 mL Intravenous Q12H   Continuous Infusions: . sodium chloride    . dextrose 5 % and 0.45% NaCl 10 mL/hr at 06/25/18 0823   PRN Meds:.sodium chloride, alum & mag hydroxide-simeth, diphenhydrAMINE **OR** diphenhydrAMINE, naloxone **AND** sodium chloride flush, ondansetron (ZOFRAN) IV, ondansetron (ZOFRAN) IV, oxyCODONE, sodium chloride, sodium chloride flush, traMADol, zolpidem  Xrays Dg Chest 2 View  Result Date: 06/29/2018 CLINICAL DATA:  Surgery follow-up EXAM: CHEST - 2 VIEW COMPARISON:  Chest radiograph from one day prior. FINDINGS: Stable left apical chest tube. Stable cardiomediastinal silhouette with normal heart size. Tiny left apical pneumothorax, not significantly changed. No right pneumothorax. No pleural effusion. Stable mild left basilar atelectasis. No pulmonary  edema. IMPRESSION: Tiny left apical pneumothorax is not significantly changed. Stable left apical chest tube. Stable mild left basilar atelectasis. Electronically Signed   By: Ilona Sorrel M.D.   On: 06/29/2018 07:29   Dg Chest Port 1 View  Result Date: 06/28/2018 CLINICAL DATA:  Postop chest tube EXAM: PORTABLE CHEST 1 VIEW COMPARISON:  06/27/2018, 06/26/2018, 06/25/2018, CT 05/28/2018 FINDINGS: The right lung remains clear. Slight repositioning of left chest tube with tip projecting over the left lung apex and more straight in appearance. Mild elevation of left diaphragm with basilar atelectasis. No discrete pneumothorax. Stable cardiomediastinal silhouette with aortic  atherosclerosis. Clips at the thoracic inlet. IMPRESSION: 1. Slight repositioning of left chest tube as described above. No appreciable pneumothorax. 2. Mild left basilar atelectasis. Electronically Signed   By: Donavan Foil M.D.   On: 06/28/2018 16:27    Assessment/Plan: S/P Procedure(s) (LRB): VIDEO ASSISTED THORACOSCOPY (VATS)/LUNG RESECTION (Left)  1 conts to do well, BP well controlled, occas sinus tachy 2 sats good on RA 3 tiny apical pntx, no air leak on water seal, will d/c chest tube today 4 no new labs 5 poss home in am   LOS: 5 days    CESARIO WEIDINGER [facility] 06/29/2018  Pager [phone]

## 2018-06-29 NOTE — Plan of Care (Signed)

## 2018-06-29 NOTE — Plan of Care (Signed)

## 2018-06-29 NOTE — Progress Notes (Signed)
Chest tube out, pt tolerated well, pt is walking in a hallway without SOB, distress and pain, will continue to monitor  Palma Holter, RN

## 2018-06-30 ENCOUNTER — Inpatient Hospital Stay (HOSPITAL_COMMUNITY): Payer: Medicare Other

## 2018-06-30 LAB — GLUCOSE, CAPILLARY: Glucose-Capillary: 111 mg/dL — ABNORMAL HIGH (ref 70–99)

## 2018-06-30 MED ORDER — TRAMADOL HCL 50 MG PO TABS: 50.0000 mg | ORAL_TABLET | Freq: Four times a day (QID) | ORAL | 0 refills | Status: DC | PRN

## 2018-06-30 NOTE — Consult Note (Signed)
   [facility] Inpatient Consult   06/30/2018  Miguel Wolfe 1945-08-18 [id number]    Patient checked if potential [place] Management services are needed under his Medicare/ Next[employer] plan.  Review of patient's medical record reveals patient had 2 hospitalizations and 1 ED visit in the past 6 months, with risk for unplanned readmissions.  Per MD note dated 06/26/18 states as follows: [name] y.o.maleis seen/ followed in the (cardiothoracic) office foran abnormal CT scan. He has had a navigation bronchoscopy and biopsy of right and left upper lobe lesions. In nov 2019 and again last week, intiallypath was negative. Serial ct showed stable size of nodule left upper lobe but because of persistence repeat nav bronch done last week and confirmed adeno ca, left upper lobe. (left upper lobe adenocarcinoma of the lung, status post left video-assisted thoracoscopy (VATS), left upper lobectomy with lymph node dissection, chest tubes were removed).  Primary Care Provider is  Dr. [name] with [place] [facility], listed as providing transition of care follow up.  Patient transitioned to home prior to speaking with him.  Currently, has no [facility] Care Management needs noted. Will follow with General EMMI calls.  Please place a [facility] Care Management consult as appropriate and for questions, please call:    Edwena Felty A. Sondos Wolfman, BSN, RN-BC [facility] Liaison Cell: [phone]

## 2018-06-30 NOTE — Progress Notes (Signed)
      [address]       [place] [address]             [phone]      6 Days Post-Op Procedure(s) (LRB): VIDEO ASSISTED THORACOSCOPY (VATS)/LUNG RESECTION (Left)   Subjective:  No new complaints.  Ambulating around room, ready to go home.  Objective: Vital signs in last 24 hours: Temp:  [98 F (36.7 C)-98.5 F (36.9 C)] 98.5 F (36.9 C) (06/22 0405) Pulse Rate:  [80-102] 102 (06/22 0635) Cardiac Rhythm: Sinus tachycardia (06/22 0700) Resp:  [12-19] 14 (06/22 0405) BP: (111-141)/(76-90) 140/87 (06/22 0635) SpO2:  [97 %-99 %] 99 % (06/22 0405) Weight:  [74.1 kg] 74.1 kg (06/22 0405)  Intake/Output from previous day: 06/21 0701 - 06/22 0700 In: 763 [P.O.:760; I.V.:3] Out: 300 [Urine:300]  General appearance: alert, cooperative and no distress Heart: regular rate and rhythm Lungs: clear to auscultation bilaterally Abdomen: soft, non-tender; bowel sounds normal; no masses,  no organomegaly Extremities: extremities normal, atraumatic, no cyanosis or edema Wound: clean and dry  Lab Results: Recent Labs    06/27/18 0957  WBC 9.9  HGB 16.0  HCT 47.8  PLT 190   BMET:  Recent Labs    06/27/18 0957  NA 136  K 3.2*  CL 96*  CO2 29  GLUCOSE 161*  BUN 18  CREATININE 0.78  CALCIUM 8.9    PT/INR: No results for input(s): LABPROT, INR in the last 72 hours. ABG    Component Value Date/Time   PHART 7.409 06/25/2018 0536   HCO3 28.7 (H) 06/25/2018 0536   TCO2 30 06/25/2018 0536   O2SAT 96.0 06/25/2018 0536   CBG (last 3)  Recent Labs    06/29/18 1605 06/29/18 2201 06/30/18 0623  GLUCAP 136* 122* 111*    Assessment/Plan: S/P Procedure(s) (LRB): VIDEO ASSISTED THORACOSCOPY (VATS)/LUNG RESECTION (Left)  1. CV- hemodynamically stable in NSR 2. Pulm-  No acute issues, off oxygen, CXR remains stable 3. Dispo- patient stable, will d/c home today   LOS: 6 days    Ellwood Handler 06/30/2018

## 2018-06-30 NOTE — Discharge Instructions (Signed)
Discharge Instructions:  1. You may shower, please wash incisions daily with soap and water and keep dry.  If you wish to cover wounds with dressing you may do so but please keep clean and change daily.  No tub baths or swimming until incisions have completely healed.  If your incisions become red or develop any drainage please call our office at [phone]  2. No Driving until cleared by our office and you are no longer using narcotic pain medications  3. Fever of 101.5 for at least 24 hours, please contact our office at [phone]  4. Activity- up as tolerated, please walk at least 3 times per day.  Avoid strenuous activity, no lifting, pushing, or pulling with your arms over 8-10 lbs for a minimum of 6 weeks  5. If any questions or concerns arise, please do not hesitate to contact our office at [phone]

## 2018-06-30 NOTE — Progress Notes (Signed)
Patient reviewed discharge instructions and verbalized understanding.  Via wheelchair to waiting wife in the car

## 2018-07-02 ENCOUNTER — Encounter (HOSPITAL_COMMUNITY): Payer: Self-pay | Admitting: Cardiothoracic Surgery

## 2018-07-04 DIAGNOSIS — Z85118 Personal history of other malignant neoplasm of bronchus and lung: Secondary | ICD-10-CM | POA: Diagnosis not present

## 2018-07-04 DIAGNOSIS — I10 Essential (primary) hypertension: Secondary | ICD-10-CM | POA: Diagnosis not present

## 2018-07-04 DIAGNOSIS — E876 Hypokalemia: Secondary | ICD-10-CM | POA: Diagnosis not present

## 2018-07-07 ENCOUNTER — Encounter (INDEPENDENT_AMBULATORY_CARE_PROVIDER_SITE_OTHER): Payer: Self-pay

## 2018-07-07 ENCOUNTER — Other Ambulatory Visit: Payer: Self-pay

## 2018-07-07 DIAGNOSIS — Z4802 Encounter for removal of sutures: Secondary | ICD-10-CM

## 2018-07-08 ENCOUNTER — Encounter (HOSPITAL_COMMUNITY): Payer: Self-pay | Admitting: Internal Medicine

## 2018-07-09 ENCOUNTER — Encounter (HOSPITAL_COMMUNITY): Payer: Self-pay | Admitting: Internal Medicine

## 2018-07-10 ENCOUNTER — Other Ambulatory Visit: Payer: Self-pay | Admitting: *Deleted

## 2018-07-10 NOTE — Progress Notes (Signed)
The proposed treatment discussed in cancer conference 07/10/2018 is for discussion purpose only and is not a binding recommendation.  The patient was not physically examined nor present for their treatment options.  Therefore, final treatment plans cannot be decided.

## 2018-07-15 ENCOUNTER — Encounter: Payer: Self-pay | Admitting: Cardiothoracic Surgery

## 2018-07-17 ENCOUNTER — Other Ambulatory Visit: Payer: Self-pay | Admitting: Cardiothoracic Surgery

## 2018-07-17 DIAGNOSIS — C3410 Malignant neoplasm of upper lobe, unspecified bronchus or lung: Secondary | ICD-10-CM

## 2018-07-21 ENCOUNTER — Telehealth: Payer: Self-pay | Admitting: *Deleted

## 2018-07-21 DIAGNOSIS — C3410 Malignant neoplasm of upper lobe, unspecified bronchus or lung: Secondary | ICD-10-CM

## 2018-07-21 NOTE — Telephone Encounter (Signed)
Oncology Nurse Navigator Documentation  Oncology Nurse Navigator Flowsheets 07/21/2018  Navigator Location [facility]  Referral Date to RadOnc/MedOnc 07/21/2018  Navigator Encounter Type Telephone/I received referral from Dr. Servando Snare.  I updated Dr. Julien Nordmann.  I called patient and scheduled him to be seen with Dr. Julien Nordmann on 08/04/2018 with labs and appt.  He verbalized understanding of appt time and place.   Telephone Outgoing Call  Barriers/Navigation Needs Coordination of Care;Education  Education Other  Interventions Coordination of Care;Education  Coordination of Care Appts  Education Method Verbal  Acuity Level 2  Time Spent with Patient 30

## 2018-07-24 ENCOUNTER — Ambulatory Visit
Admission: RE | Admit: 2018-07-24 | Discharge: 2018-07-24 | Disposition: A | Discharge status: Home / Self Care | Payer: Medicare Other | Source: Ambulatory Visit | Attending: Cardiothoracic Surgery | Admitting: Cardiothoracic Surgery

## 2018-07-24 ENCOUNTER — Other Ambulatory Visit: Payer: Self-pay

## 2018-07-24 ENCOUNTER — Ambulatory Visit (INDEPENDENT_AMBULATORY_CARE_PROVIDER_SITE_OTHER): Payer: Self-pay | Admitting: Cardiothoracic Surgery

## 2018-07-24 ENCOUNTER — Encounter: Payer: Self-pay | Admitting: Cardiothoracic Surgery

## 2018-07-24 VITALS — BP 135/81 | HR 72 | Temp 96.6°F | Resp 18 | Ht 71.0 in | Wt 168.6 lb

## 2018-07-24 DIAGNOSIS — R911 Solitary pulmonary nodule: Secondary | ICD-10-CM

## 2018-07-24 DIAGNOSIS — R918 Other nonspecific abnormal finding of lung field: Secondary | ICD-10-CM | POA: Diagnosis not present

## 2018-07-24 DIAGNOSIS — Z85118 Personal history of other malignant neoplasm of bronchus and lung: Secondary | ICD-10-CM | POA: Diagnosis not present

## 2018-07-24 DIAGNOSIS — C3410 Malignant neoplasm of upper lobe, unspecified bronchus or lung: Secondary | ICD-10-CM

## 2018-07-24 NOTE — Progress Notes (Signed)
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 SURGICAL PROCEDURE:  Left video-assisted thoracoscopy, left upper lobectomy with lymph node dissection, and intercostal nerve block.  Cancer Staging Lung cancer, left upper lobe [facility]) Staging form: Lung, AJCC 8th Edition - Pathologic stage from 06/23/2018: Stage IA3 (pT1c, pN0, cM0) - Signed by Grace Isaac, MD on 06/25/2018 - Clinical: No stage assigned - Unsigned   History of Present Illness:     Patient is now almost 4 weeks postop from left upper lobectomy for stage I a 3 adenocarcinoma.  The patient is walking at least 3 times a day.  Complains of some paresthesias over his left chest wall.  Has not needed any narcotic pain medicine since discharge.    Zubrod Score: At the time of surgery this patient's most appropriate activity status/level should be described as: _0     0    Normal activity, no symptoms _1     1    Restricted in physical strenuous activity but ambulatory, able to do out light work _2     2    Ambulatory and capable of self care, unable to do work activities, up and about                 >50 % of waking hours                                                                                   _3     3    Only limited self care, in bed greater than 50% of waking hours _4     4    Completely disabled, no self care, confined to bed or chair _5     5    Moribund  Social History   Tobacco Use  Smoking Status Former Smoker  . Types: Cigarettes  . Quit date: 02/01/1967  . Years  since quitting: 51.5  Smokeless Tobacco Never Used       Allergies  Allergen Reactions  . Gemfibrozil Other (See Comments)    Muscle pain  . Niaspan [Niacin Er] Rash and Other (See Comments)    Flushing - aspirin did not mitigate   . Simvastatin Other (See Comments)    Drug-Drug interaction with amlodipine    Current Outpatient Medications  Medication Sig Dispense Refill  . acetaminophen (TYLENOL) 325 MG tablet Take 650 mg by mouth every 6 (six) hours as needed for moderate pain or headache.     Marland Kitchen amLODipine (NORVASC) 10 MG tablet Take 10 mg by mouth daily.    Marland Kitchen aspirin EC 81 MG tablet Take 81 mg by mouth every evening.     Marland Kitchen  chlorhexidine (PERIDEX) 0.12 % solution Use as directed 15 mLs in the mouth or throat 2 (two) times daily.    Marland Kitchen ibuprofen (ADVIL,MOTRIN) 200 MG tablet Take 400 mg by mouth every 8 (eight) hours as needed for headache or moderate pain.     Marland Kitchen levothyroxine (SYNTHROID) 137 MCG tablet Take 137 mcg by mouth daily before breakfast.     . losartan (COZAAR) 100 MG tablet Take 100 mg by mouth daily.    . nitroGLYCERIN (NITROSTAT) 0.4 MG SL tablet Place 1 tablet (0.4 mg total) under the tongue every 5 (five) minutes as needed for chest pain. 25 tablet 12  . omega-3 acid ethyl esters (LOVAZA) 1 g capsule Take 2 g by mouth 2 (two) times daily.    . ondansetron (ZOFRAN) 4 MG tablet Take 1 tablet (4 mg total) by mouth every 6 (six) hours as needed for nausea or vomiting. 20 tablet 0  . pantoprazole (PROTONIX) 40 MG tablet TAKE 1 TABLET(40 MG) BY MOUTH DAILY (Patient taking differently: Take 40 mg by mouth daily. ) 90 tablet 3  . propranolol (INDERAL) 20 MG tablet Take 20 mg by mouth daily.  2  . rosuvastatin (CRESTOR) 20 MG tablet TAKE 1 TABLET(20 MG) BY MOUTH DAILY (Patient taking differently: Take 20 mg by mouth daily. ) 90 tablet 3  . traMADol (ULTRAM) 50 MG tablet Take 1-2 tablets (50-100 mg total) by mouth every 6 (six) hours as needed. 15 tablet 0  . traMADol (ULTRAM) 50  MG tablet Take 1-2 tablets (50-100 mg total) by mouth every 6 (six) hours as needed (mild pain). 30 tablet 0  . traZODone (DESYREL) 100 MG tablet Take 100 mg by mouth at bedtime as needed for sleep.    Marland Kitchen zolpidem (AMBIEN) 10 MG tablet Take 10 mg by mouth at bedtime as needed for sleep.   0   No current facility-administered medications for this visit.        Physical Exam: BP 135/81 (BP Location: Left Arm, Patient Position: Sitting, Cuff Size: Normal)   Pulse 72   Temp (!) 96.6 F (35.9 C)   Resp 18   Ht _0  (1.803 m)   Wt 168 lb 9.6 oz (76.5 kg)   SpO2 96% Comment: RA  BMI 23.51 kg/m   General appearance: alert and cooperative Neurologic: intact Heart: regular rate and rhythm, S1, S2 normal, no murmur, click, rub or gallop Lungs: clear to auscultation bilaterally Abdomen: soft, non-tender; bowel sounds normal; no masses,  no organomegaly Extremities: extremities normal, atraumatic, no cyanosis or edema and Homans sign is negative, no sign of DVT Wounds: Incisions and port sites are all healing well without evidence of infection  Diagnostic Studies & Laboratory data:         Recent Radiology Findings: Dg Chest 2 View  Result Date: 07/24/2018 CLINICAL DATA:  Patient with history of lung cancer. EXAM: CHEST - 2 VIEW COMPARISON:  Chest radiograph 06/30/2018 FINDINGS: Stable cardiac and mediastinal contours. Interval development of patchy consolidation within the left lower hemithorax. No pleural effusion or pneumothorax. Thoracic spine degenerative changes. IMPRESSION: Patchy consolidative opacities left lower hemithorax may represent atelectasis. Infection not excluded. Electronically Signed   By: Lovey Newcomer M.D.   On: 07/24/2018 13:50      I have independently reviewed the above radiology findings and reviewed findings  with the patient.  Recent Labs: Lab Results  Component Value Date   WBC 9.9 06/27/2018   HGB 16.0 06/27/2018   HCT 47.8 06/27/2018  PLT 190  06/27/2018   GLUCOSE 161 (H) 06/27/2018   CHOL 151 01/21/2017   TRIG 177 (H) 01/21/2017   HDL 48 01/21/2017   LDLCALC 68 01/21/2017   ALT 17 06/26/2018   AST 31 06/26/2018   NA 136 06/27/2018   K 3.2 (L) 06/27/2018   CL 96 (L) 06/27/2018   CREATININE 0.78 06/27/2018   BUN 18 06/27/2018   CO2 29 06/27/2018   TSH 10.736 (H) 01/24/2018   INR 1.1 06/20/2018   HGBA1C 5.9 (H) 06/09/2018    PATH: Diagnosis 1. Lung, resection (segmental or lobe), left upper lobe - ADENOCARCINOMA, 2.3 CM. - THREE BENIGN LYMPH NODES (0/3). - MARGINS NOT INVOLVED. 2. Lymph node, biopsy, 10 L node - ANTHRACOTIC LYMPH NODE. - NO METASTATIC CARCINOMA IDENTIFIED. 3. Lymph node, biopsy, 10 L #2 - ANTHRACOTIC LYMPH NODE. - NO METASTATIC CARCINOMA IDENTIFIED. 4. Lymph node, biopsy, 11 L node - ANTHRACOTIC LYMPH NODE. - NO METASTATIC CARCINOMA IDENTIFIED. 5. Lymph node, biopsy, 4 L node - ANTHRACOTIC LYMPH NODE. - NO METASTATIC CARCINOMA IDENTIFIED. 6. Lymph node, biopsy, 4 L #2 node - ANTHRACOTIC LYMPH NODE. - NO METASTATIC CARCINOMA IDENTIFIED. 7. Lymph node, biopsy, 11 L #2 node - ANTHRACOTIC LYMPH NODE. - NO METASTATIC CARCINOMA IDENTIFIED. 8. Lymph node, biopsy, 12 L node - ANTHRACOTIC LYMPH NODE. - NO METASTATIC CARCINOMA IDENTIFIED. 9. Lymph node, biopsy, 12 L #2 node - ANTHRACOTIC LYMPH NODE. - NO METASTATIC CARCINOMA IDENTIFIED. 10. Lymph node, biopsy, Level 8 Paraesophageal - ANTHRACOTIC LYMPH NODE. 1 of 3 FINAL for Busker, Mousa L ([id number]) Diagnosis(continued) - NO METASTATIC CARCINOMA IDENTIFIED. 11. Lymph node, biopsy, 4 L #3 node - ANTHRACOTIC LYMPH NODE. - NO METASTATIC CARCINOMA IDENTIFIED. Microscopic Comment 1. LUNG: Procedure: Left upper lobectomy and ten additional lymph node biopsies. Specimen Laterality: Left. Tumor Site: Left upper lobe. Tumor Size: 2.3 x 2.0 x 1.7 cm. Total Tumor Size Inclusive of Invasive and Lepidic Components (applies only to invasive  nonmucinous adenocarcinoma with a lepidic component): 2.3 cm. Invasive Tumor Size (applies only to invasive nonmucinous adenocarcinoma with a lepidic component: 2.3 cm. Tumor Focality: Unifocal. Histologic Type: Adenocarcinoma. Visceral Pleura Invasion: No. Lymphovascular Invasion: No. Direct Invasion of Adjacent Structures: N/A. Margins: Free of tumor. Treatment Effect: No. Regional Lymph Nodes: Number of Lymph Nodes Involved: 0. Number of Lymph Nodes Examined: 13. Pathologic Stage Classification (pTNM, AJCC 8th Edition): pTic, pN0. Ancillary Studies: Foundation 1 and PDL-1 pending. Representative Tumor Block: 1E and 27F. (JDP:ah 06/25/18) (v4.0.0.3) Claudette Laws MD  Assessment / Plan:      Patient progressing satisfactorily after left upper lobectomy node dissection for stage I adenocarcinoma of the lung.  Patient has appointment in medical oncology next week for follow-up.  He does have groundglass opacities in the right upper lobe.  Plan to see him with a chest x-ray in 2 months, will coordinate with medical oncology follow-up CT scans   Medication Changes: No orders of the defined types were placed in this encounter.    Grace Isaac 07/24/2018 2:46 PM

## 2018-07-31 ENCOUNTER — Telehealth: Payer: Self-pay

## 2018-07-31 NOTE — Telephone Encounter (Signed)
Patient's dental office contacted the office requesting "medical clearance" for dental cleaning.  I advised that per Dr. Servando Snare, patient does not need clearance for dental cleaning.  Patient is encouraged to maintain good dental health and routine cleanings.  Miguel Wolfe with dental office aware and acknowledged receipt.

## 2018-08-04 ENCOUNTER — Other Ambulatory Visit: Payer: Self-pay

## 2018-08-04 ENCOUNTER — Encounter: Payer: Self-pay | Admitting: Internal Medicine

## 2018-08-04 ENCOUNTER — Telehealth: Payer: Self-pay | Admitting: Internal Medicine

## 2018-08-04 ENCOUNTER — Inpatient Hospital Stay: Payer: Medicare Other | Attending: Internal Medicine | Admitting: Internal Medicine

## 2018-08-04 ENCOUNTER — Inpatient Hospital Stay: Payer: Medicare Other

## 2018-08-04 VITALS — BP 116/82 | HR 72 | Temp 98.5°F | Resp 18 | Ht 71.0 in | Wt 168.1 lb

## 2018-08-04 DIAGNOSIS — C3412 Malignant neoplasm of upper lobe, left bronchus or lung: Secondary | ICD-10-CM | POA: Insufficient documentation

## 2018-08-04 DIAGNOSIS — I10 Essential (primary) hypertension: Secondary | ICD-10-CM | POA: Insufficient documentation

## 2018-08-04 DIAGNOSIS — Z87891 Personal history of nicotine dependence: Secondary | ICD-10-CM | POA: Diagnosis not present

## 2018-08-04 DIAGNOSIS — Z7982 Long term (current) use of aspirin: Secondary | ICD-10-CM

## 2018-08-04 DIAGNOSIS — C3410 Malignant neoplasm of upper lobe, unspecified bronchus or lung: Secondary | ICD-10-CM

## 2018-08-04 DIAGNOSIS — E041 Nontoxic single thyroid nodule: Secondary | ICD-10-CM | POA: Diagnosis not present

## 2018-08-04 DIAGNOSIS — E785 Hyperlipidemia, unspecified: Secondary | ICD-10-CM | POA: Diagnosis not present

## 2018-08-04 DIAGNOSIS — Z79899 Other long term (current) drug therapy: Secondary | ICD-10-CM | POA: Insufficient documentation

## 2018-08-04 DIAGNOSIS — Z902 Acquired absence of lung [part of]: Secondary | ICD-10-CM

## 2018-08-04 DIAGNOSIS — Z800 Family history of malignant neoplasm of digestive organs: Secondary | ICD-10-CM

## 2018-08-04 DIAGNOSIS — C3490 Malignant neoplasm of unspecified part of unspecified bronchus or lung: Secondary | ICD-10-CM

## 2018-08-04 LAB — CMP (CANCER CENTER ONLY)
ALT: 11 U/L (ref 0–44)
AST: 17 U/L (ref 15–41)
Albumin: 4.3 g/dL (ref 3.5–5.0)
Alkaline Phosphatase: 71 U/L (ref 38–126)
Anion gap: 9 (ref 5–15)
BUN: 12 mg/dL (ref 8–23)
CO2: 28 mmol/L (ref 22–32)
Calcium: 9.1 mg/dL (ref 8.9–10.3)
Chloride: 105 mmol/L (ref 98–111)
Creatinine: 0.96 mg/dL (ref 0.61–1.24)
GFR, Est AFR Am: >60 mL/min (ref >60)
GFR, Estimated: >60 mL/min (ref >60)
Glucose, Bld: 105 mg/dL — ABNORMAL HIGH (ref 70–99)
Potassium: 3.8 mmol/L (ref 3.5–5.1)
Sodium: 142 mmol/L (ref 135–145)
Total Bilirubin: 0.7 mg/dL (ref 0.3–1.2)
Total Protein: 6.9 g/dL (ref 6.5–8.1)

## 2018-08-04 LAB — CBC WITH DIFFERENTIAL (CANCER CENTER ONLY)
Abs Immature Granulocytes: 0.01 10*3/uL (ref 0.00–0.07)
Basophils Absolute: 0 10*3/uL (ref 0.0–0.1)
Basophils Relative: 0 %
Eosinophils Absolute: 0.4 10*3/uL (ref 0.0–0.5)
Eosinophils Relative: 6 %
HCT: 44.5 % (ref 39.0–52.0)
Hemoglobin: 14.7 g/dL (ref 13.0–17.0)
Immature Granulocytes: 0 %
Lymphocytes Relative: 26 %
Lymphs Abs: 1.9 10*3/uL (ref 0.7–4.0)
MCH: 29.8 pg (ref 26.0–34.0)
MCHC: 33 g/dL (ref 30.0–36.0)
MCV: 90.3 fL (ref 80.0–100.0)
Monocytes Absolute: 0.6 10*3/uL (ref 0.1–1.0)
Monocytes Relative: 9 %
Neutro Abs: 4.3 10*3/uL (ref 1.7–7.7)
Neutrophils Relative %: 59 %
Platelet Count: 207 10*3/uL (ref 150–400)
RBC: 4.93 MIL/uL (ref 4.22–5.81)
RDW: 12.6 % (ref 11.5–15.5)
WBC Count: 7.2 10*3/uL (ref 4.0–10.5)
nRBC: 0 % (ref 0.0–0.2)

## 2018-08-04 NOTE — Telephone Encounter (Signed)
Gave avs and calendar ° °

## 2018-08-04 NOTE — Progress Notes (Signed)
[place] [phone]   Fax:([phone]) [phone]  CONSULT NOTE  REFERRING PHYSICIAN: Dr. Camillia Herter  REASON FOR CONSULTATION:  73 years old white male recently diagnosed with lung cancer.  HPI Miguel Wolfe is a 73 y.o. male with past medical history significant for coronary artery disease, hypertension, GERD, dyslipidemia, osteoarthritis as well as recurrent angina.  The patient also has remote history of few years of his smoking in his 69s.  He was complaining of left-sided chest pain in the fall of last year 2019.  He was evaluated by his cardiologist and cardiac work-up was unremarkable.  The patient had chest x-ray on October 14, 2017 and it showed minimal left basilar atelectasis with 1.0 cm nonspecific nodular density in the lateral right lung base.  This was followed by CT scan of the chest without contrast on October 23, 2017 and it showed bilateral groundglass, solid and partially solid pulmonary nodules identified.  Within the left upper lobe there was a partially solid nodule measuring 2.0 cm with a 1.4 cm solid component.  In the right lower lobe there was a solid subpleural nodule measuring 1.0 cm and these findings are suspicious for multifocal adenocarcinoma.  A PET scan was performed on November 14, 2017 and it showed bilateral mixed attenuation pulmonary nodules with no substantial change since October 23, 2017 also the solid 1.0 cm right lower lobe nodule seen previously is a smaller at 0.8 cm.  The dominant lesion showed low level FDG uptake and low-grade or well differentiated neoplasm could not be excluded.  There was also 1.6 cm hypermetabolic right thyroid nodule.  The patient was referred to Dr. Servando Snare and bronchoscopy on November 18, 2017 was not conclusive for malignancy.  He also had evaluation of the thyroid nodule and underwent surgical resection under the care of Dr. 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  Repeat CT scan of the chest on 05/28/2018 showed the bilateral upper lobe groundglass density pulmonary nodules with a peribronchovascular solid nodular component in the left upper lobe.  These nodules were stable compared to the earlier studies.  Low-grade adenocarcinoma was not excluded.  On 06/11/2018 the patient underwent repeat bronchoscopy under the care of Dr. Servando Snare and the final pathology [id number]) of the left upper lobe was consistent with adenocarcinoma. On June 24, 2018 the patient underwent left VATS with left upper lobectomy with lymph node dissection under the care of Dr. Servando Snare. The final pathology ([id number]) showed adenocarcinoma measuring 2.3 cm with no evidence of metastatic disease to the dissected lymph nodes and the final resection margin was negative for malignancy.  There was no evidence for visceral pleural or lymphovascular invasion. Molecular studies were performed by foundation 1 and that showed no actionable mutations and PDL 1 expression was 0%. The patient was referred to me today for evaluation and recommendation regarding his condition. When seen today he is still recovering from his recent surgery with some soreness in the left side of the chest.  He also has some shortness of breath with exertion and mild cough but no significant weight loss or night sweats.  He has no nausea, vomiting, diarrhea or constipation.  He has no headache or visual changes. Family history significant for father had coronary artery disease and currently at age 71, mother had dementia and currently age 35.  The patient has a sister with Crohn's disease and paternal uncle with colon cancer. He is married  and has 2 daughters and 5 grand children.  Is currently retired and used to work for [employer].  He has a history of short.  Of smoking in his 70s but quit long time ago.  He drinks a glass of wine every night.  He has no history of drug abuse.  HPI  Past  Medical History:  Diagnosis Date   Anginal pain ([place])    Arthritis    Colon polyps    Coronary artery disease involving native coronary artery of native heart without angina pectoris 04/23/2016   Myoview 02/08/16: EF 51, inf-lat, apical lateral scar with peri-infarct ischemia; intermediate risk  // LHC 2/18: dLAD 20, oD1 20, pLCx 20, pRCA 80, mRCA 20, dRCA 30, AM 90 >> PCI: 4 x 24 mm Synergy DES to pRCA    Family history of adverse reaction to anesthesia    Mom vomits and Dad went "nuts"   Fatigue    GERD (gastroesophageal reflux disease)    Hemorrhoids    History of echocardiogram    Echo 02/08/16: Mild LVH, EF 55-60, no RWMA, Gr 1 DD, trivial AI, mild LAE   Hyperlipidemia    Hypertension    PONV (postoperative nausea and vomiting)    Pre-diabetes    Pulmonary nodules     Past Surgical History:  Procedure Laterality Date   CARDIAC CATHETERIZATION     COLONOSCOPY WITH ESOPHAGOGASTRODUODENOSCOPY (EGD)  2008   CORONARY STENT INTERVENTION N/A 02/20/2016   Procedure: Coronary Stent Intervention;  Surgeon: Burnell Blanks, MD;  Location: [facility];  Service: Cardiovascular;  Laterality: N/A;   ESOPHAGOGASTRODUODENOSCOPY     LEFT HEART CATH AND CORONARY ANGIOGRAPHY N/A 02/20/2016   Procedure: Left Heart Cath and Coronary Angiography;  Surgeon: Burnell Blanks, MD;  Location: [facility];  Service: Cardiovascular;  Laterality: N/A;   PILONIDAL CYCT     REFRACTIVE SURGERY     retina tear repair   THYROIDECTOMY N/A 01/17/2018   Procedure: TOTAL THYROIDECTOMY;  Surgeon: Armandina Gemma, MD;  Location: [facility];  Service: General;  Laterality: N/A;   VIDEO ASSISTED THORACOSCOPY (VATS)/WEDGE RESECTION Left 06/24/2018   Procedure: VIDEO ASSISTED THORACOSCOPY (VATS)/LUNG RESECTION;  Surgeon: Grace Isaac, MD;  Location: [facility];  Service: Thoracic;  Laterality: Left;   VIDEO BRONCHOSCOPY WITH ENDOBRONCHIAL NAVIGATION N/A 11/18/2017   Procedure:  VIDEO BRONCHOSCOPY WITH ENDOBRONCHIAL NAVIGATION WITH BIOPSIES OF LEFT AND RIGHT UPPER LOBES;  Surgeon: Grace Isaac, MD;  Location: [facility];  Service: Thoracic;  Laterality: N/A;   VIDEO BRONCHOSCOPY WITH ENDOBRONCHIAL NAVIGATION N/A 06/11/2018   Procedure: VIDEO BRONCHOSCOPY WITH ENDOBRONCHIAL NAVIGATION;  Surgeon: Grace Isaac, MD;  Location: [facility];  Service: Thoracic;  Laterality: N/A;    Family History  Problem Relation Age of Onset   Heart attack Father 43       BYPASS   Crohn's disease Sister    Colon cancer Paternal Uncle    Healthy Sister     Social History Social History   Tobacco Use   Smoking status: Former Smoker    Types: Cigarettes    Quit date: 02/01/1967    Years since quitting: 51.5   Smokeless tobacco: Never Used  Substance Use Topics   Alcohol use: Yes    Comment: a drink before dinner a few times a week   Drug use: No    Allergies  Allergen Reactions   Gemfibrozil Other (See Comments)    Muscle pain   Niaspan [Niacin Er]  Rash and Other (See Comments)    Flushing - aspirin did not mitigate    Simvastatin Other (See Comments)    Drug-Drug interaction with amlodipine    Current Outpatient Medications  Medication Sig Dispense Refill   acetaminophen (TYLENOL) 325 MG tablet Take 650 mg by mouth every 6 (six) hours as needed for moderate pain or headache.      amLODipine (NORVASC) 10 MG tablet Take 10 mg by mouth daily.     aspirin EC 81 MG tablet Take 81 mg by mouth every evening.      chlorhexidine (PERIDEX) 0.12 % solution Use as directed 15 mLs in the mouth or throat 2 (two) times daily.     ibuprofen (ADVIL,MOTRIN) 200 MG tablet Take 400 mg by mouth every 8 (eight) hours as needed for headache or moderate pain.      levothyroxine (SYNTHROID) 137 MCG tablet Take 137 mcg by mouth daily before breakfast.      losartan (COZAAR) 100 MG tablet Take 100 mg by mouth daily.     nitroGLYCERIN (NITROSTAT) 0.4 MG SL tablet Place 1  tablet (0.4 mg total) under the tongue every 5 (five) minutes as needed for chest pain. (Patient not taking: Reported on 08/04/2018) 25 tablet 12   omega-3 acid ethyl esters (LOVAZA) 1 g capsule Take 2 g by mouth 2 (two) times daily.     ondansetron (ZOFRAN) 4 MG tablet Take 1 tablet (4 mg total) by mouth every 6 (six) hours as needed for nausea or vomiting. (Patient not taking: Reported on 08/04/2018) 20 tablet 0   pantoprazole (PROTONIX) 40 MG tablet TAKE 1 TABLET(40 MG) BY MOUTH DAILY (Patient taking differently: Take 40 mg by mouth daily. ) 90 tablet 3   propranolol (INDERAL) 20 MG tablet Take 20 mg by mouth daily.  2   rosuvastatin (CRESTOR) 20 MG tablet TAKE 1 TABLET(20 MG) BY MOUTH DAILY (Patient taking differently: Take 20 mg by mouth daily. ) 90 tablet 3   traMADol (ULTRAM) 50 MG tablet Take 1-2 tablets (50-100 mg total) by mouth every 6 (six) hours as needed (mild pain). 30 tablet 0   traZODone (DESYREL) 100 MG tablet Take 100 mg by mouth at bedtime as needed for sleep.     zolpidem (AMBIEN) 10 MG tablet Take 10 mg by mouth at bedtime as needed for sleep.   0   No current facility-administered medications for this visit.     Review of Systems  Constitutional: negative Eyes: negative Ears, nose, mouth, throat, and face: negative Respiratory: positive for cough, dyspnea on exertion and pleurisy/chest pain Cardiovascular: negative Gastrointestinal: negative Genitourinary:negative Integument/breast: negative Hematologic/lymphatic: negative Musculoskeletal:negative Neurological: negative Behavioral/Psych: negative Endocrine: negative Allergic/Immunologic: negative  Physical Exam  [id number], healthy, no distress, well nourished and well developed SKIN: skin color, texture, turgor are normal, no rashes or significant lesions HEAD: Normocephalic, No masses, lesions, tenderness or abnormalities EYES: normal, PERRLA, Conjunctiva are pink and non-injected EARS: External ears  normal, Canals clear OROPHARYNX:no exudate, no erythema and lips, buccal mucosa, and tongue normal  NECK: supple, no adenopathy, no JVD LYMPH:  no palpable lymphadenopathy, no hepatosplenomegaly LUNGS: clear to auscultation , and palpation HEART: regular rate & rhythm, no murmurs and no gallops ABDOMEN:abdomen soft, non-tender, normal bowel sounds and no masses or organomegaly BACK: No CVA tenderness, Range of motion is normal EXTREMITIES:no joint deformities, effusion, or inflammation, no edema  NEURO: alert & oriented x 3 with fluent speech, no focal motor/sensory deficits  PERFORMANCE STATUS: ECOG 1  LABORATORY DATA: Lab Results  Component Value Date   WBC 7.2 08/04/2018   HGB 14.7 08/04/2018   HCT 44.5 08/04/2018   MCV 90.3 08/04/2018   PLT 207 08/04/2018      Chemistry      Component Value Date/Time   NA 136 06/27/2018 0957   NA 143 04/24/2016 0935   K 3.2 (L) 06/27/2018 0957   CL 96 (L) 06/27/2018 0957   CO2 29 06/27/2018 0957   BUN 18 06/27/2018 0957   BUN 17 04/24/2016 0935   CREATININE 0.78 06/27/2018 0957      Component Value Date/Time   CALCIUM 8.9 06/27/2018 0957   ALKPHOS 54 06/26/2018 0426   AST 31 06/26/2018 0426   ALT 17 06/26/2018 0426   BILITOT 1.8 (H) 06/26/2018 0426   BILITOT 0.7 04/24/2016 0935       RADIOGRAPHIC STUDIES: Dg Chest 2 View  Result Date: 07/24/2018 CLINICAL DATA:  Patient with history of lung cancer. EXAM: CHEST - 2 VIEW COMPARISON:  Chest radiograph 06/30/2018 FINDINGS: Stable cardiac and mediastinal contours. Interval development of patchy consolidation within the left lower hemithorax. No pleural effusion or pneumothorax. Thoracic spine degenerative changes. IMPRESSION: Patchy consolidative opacities left lower hemithorax may represent atelectasis. Infection not excluded. Electronically Signed   By: Lovey Newcomer M.D.   On: 07/24/2018 13:50    ASSESSMENT: This is a very pleasant 73 years old white male recently diagnosed with a  stage Ia (T1c, N0, M0) non-small cell lung cancer, adenocarcinoma with no actionable mutation and negative PDL 1 expression diagnosed in June 2020.  The patient is status post left upper lobectomy with lymph node dissection on June 24, 2018.   PLAN: I had a lengthy discussion with the patient today about his current disease stage, prognosis and treatment options. I recommended for the patient to complete the staging work-up by ordering MRI of the brain to rule out brain metastasis. I explained to the patient that there is no survival benefit for adjuvant systemic chemotherapy for patient with a stage Ia non-small cell lung cancer and the current standard of care is observation and close monitoring.  The patient has other groundglass opacity in the right lung suspicious for low-grade adenocarcinoma that need close monitoring on upcoming imaging studies. I will arrange for the patient to have repeat CT scan of the chest and follow-up visit in 6 months. He was advised to call immediately if he has any concerning symptoms in the interval. The patient voices understanding of current disease status and treatment options and is in agreement with the current care plan.  All questions were answered. The patient knows to call the clinic with any problems, questions or concerns. We can certainly see the patient much sooner if necessary.  Thank you so much for allowing me to participate in the care of [employer]. I will continue to follow up the patient with you and assist in his care.  I spent 40 minutes counseling the patient face to face. The total time spent in the appointment was 60 minutes.  Disclaimer: This note was dictated with voice recognition software. Similar sounding words can inadvertently be transcribed and may not be corrected upon review.   Eilleen Kempf August 04, 2018, 2:46 PM

## 2018-08-05 ENCOUNTER — Telehealth: Payer: Self-pay | Admitting: *Deleted

## 2018-08-05 NOTE — Telephone Encounter (Signed)
Oncology Nurse Navigator Documentation  Oncology Nurse Navigator Flowsheets 08/05/2018  Diagnosis Status Confirmed Diagnosis Complete  Planned Course of Treatment Surgery  Navigation Complete Date: 08/05/2018  Navigator Follow Up Date: 08/05/2018  Navigator Follow Up Reason: Review Note  Navigator Location [facility]  Referral Date to RadOnc/MedOnc -  Navigator Encounter Type Clinic/MDC;Letter/Fax/Email;Telephone  Telephone Education  Abnormal Finding Date 05/28/2018  Confirmed Diagnosis Date 06/11/2018  Treatment Initiated Date 06/24/2018  Patient Visit Type MedOnc;Other  Treatment Phase Other  Barriers/Navigation Needs Education/I spoke with patient at his new patient appt with Dr. Julien Nordmann.  I gave and explained information on lung cancer and treatment plan being follow up in 6 months with a CT scan.  I called him today to update on molecular testing per Dr. Julien Nordmann.  I left my name and phone number to call if needed.   Education Newly Diagnosed Cancer Education;Other  Interventions Education  Coordination of Care -  Education Method Verbal;Written  Support Groups/Services Other  Acuity Level 2  Time Spent with Patient 49

## 2018-08-18 ENCOUNTER — Other Ambulatory Visit: Payer: Self-pay

## 2018-08-18 ENCOUNTER — Encounter (HOSPITAL_COMMUNITY): Payer: Self-pay

## 2018-08-18 ENCOUNTER — Ambulatory Visit (HOSPITAL_COMMUNITY)
Admission: RE | Admit: 2018-08-18 | Discharge: 2018-08-18 | Disposition: A | Discharge status: Home / Self Care | Payer: Medicare Other | Source: Ambulatory Visit | Attending: Internal Medicine | Admitting: Internal Medicine

## 2018-08-18 DIAGNOSIS — C3490 Malignant neoplasm of unspecified part of unspecified bronchus or lung: Secondary | ICD-10-CM

## 2018-08-19 ENCOUNTER — Other Ambulatory Visit: Payer: Self-pay | Admitting: Internal Medicine

## 2018-08-19 ENCOUNTER — Encounter: Payer: Self-pay | Admitting: *Deleted

## 2018-08-19 ENCOUNTER — Telehealth: Payer: Self-pay | Admitting: *Deleted

## 2018-08-19 DIAGNOSIS — C3490 Malignant neoplasm of unspecified part of unspecified bronchus or lung: Secondary | ICD-10-CM

## 2018-08-19 DIAGNOSIS — C3410 Malignant neoplasm of upper lobe, unspecified bronchus or lung: Secondary | ICD-10-CM

## 2018-08-19 MED ORDER — LORAZEPAM 0.5 MG PO TABS: ORAL_TABLET | ORAL | 0 refills | Status: DC

## 2018-08-19 NOTE — Telephone Encounter (Signed)
Received vm message from patient stating that he was unable to do brain MRI due to the confined space. He was told that [facility] had a 'bigger machine' and to have it done there.  Please advise

## 2018-08-19 NOTE — Progress Notes (Signed)
Oncology Nurse Navigator Documentation  Oncology Nurse Navigator Flowsheets 08/19/2018  Diagnosis Status -  Planned Course of Treatment -  Navigation Complete Date: -  Navigator Follow Up Date: -  Navigator Follow Up Reason: -  Navigator Location [facility]  Referral Date to RadOnc/MedOnc -  Navigator Encounter Type Telephone/I received a message that patient was unable to complete MRI brain yesterday.  I called to check on patient.  He states he could not complete its too claustrophobic for him.  He would like MRI with a bigger machine.  I called [facility] and was updated that the machine diameter is not bigger but it is shorter and patients tolerate it better.  I called the patient and he would like to try that machine and needs something for anxiety.  Order for MRI at [facility] on the [place] completed and updated Dr. Julien Nordmann.  Telephone Outgoing Call  Abnormal Finding Date -  Confirmed Diagnosis Date -  Treatment Initiated Date -  Patient Visit Type -  Treatment Phase Follow-up  Barriers/Navigation Needs Coordination of Care;Education  Education Other  Interventions Coordination of Care;Education  Coordination of Care Other  Education Method Verbal  Support Groups/Services -  Acuity Level 2  Time Spent with Patient 30

## 2018-08-20 ENCOUNTER — Telehealth: Payer: Self-pay | Admitting: *Deleted

## 2018-08-20 NOTE — Telephone Encounter (Signed)
Oncology Nurse Navigator Documentation  Oncology Nurse Navigator Flowsheets 08/20/2018  Diagnosis Status -  Planned Course of Treatment -  Navigation Complete Date: -  Navigator Follow Up Date: -  Navigator Follow Up Reason: -  Navigator Location [facility]  Referral Date to RadOnc/MedOnc -  Navigator Encounter Type Telephone/I called Mr. Marty to update him on his medication before MRI.  He verbalized understanding.  I also updated him that scan is still not authorized but it is being worked on.  He will get a call from central scheduling about his appt.    Telephone Outgoing Call  Abnormal Finding Date -  Confirmed Diagnosis Date -  Treatment Initiated Date -  Patient Visit Type -  Treatment Phase Follow-up  Barriers/Navigation Needs Education  Education Other  Interventions Education  Coordination of Care -  Education Method Verbal  Support Groups/Services -  Acuity Level 1  Time Spent with Patient 30

## 2018-08-25 ENCOUNTER — Ambulatory Visit
Admission: RE | Admit: 2018-08-25 | Discharge: 2018-08-25 | Disposition: A | Discharge status: Home / Self Care | Payer: Medicare Other | Source: Ambulatory Visit | Attending: Internal Medicine | Admitting: Internal Medicine

## 2018-08-25 DIAGNOSIS — C73 Malignant neoplasm of thyroid gland: Secondary | ICD-10-CM

## 2018-08-25 DIAGNOSIS — R600 Localized edema: Secondary | ICD-10-CM | POA: Diagnosis not present

## 2018-08-28 DIAGNOSIS — E890 Postprocedural hypothyroidism: Secondary | ICD-10-CM | POA: Diagnosis not present

## 2018-08-28 DIAGNOSIS — Z85850 Personal history of malignant neoplasm of thyroid: Secondary | ICD-10-CM | POA: Diagnosis not present

## 2018-08-28 DIAGNOSIS — Z85118 Personal history of other malignant neoplasm of bronchus and lung: Secondary | ICD-10-CM | POA: Diagnosis not present

## 2018-08-28 DIAGNOSIS — Z85828 Personal history of other malignant neoplasm of skin: Secondary | ICD-10-CM | POA: Diagnosis not present

## 2018-08-28 DIAGNOSIS — R232 Flushing: Secondary | ICD-10-CM | POA: Diagnosis not present

## 2018-08-28 DIAGNOSIS — Z808 Family history of malignant neoplasm of other organs or systems: Secondary | ICD-10-CM | POA: Diagnosis not present

## 2018-09-03 ENCOUNTER — Ambulatory Visit (HOSPITAL_COMMUNITY)
Admission: RE | Admit: 2018-09-03 | Discharge: 2018-09-03 | Disposition: A | Discharge status: Home / Self Care | Payer: Medicare Other | Source: Ambulatory Visit | Attending: Internal Medicine | Admitting: Internal Medicine

## 2018-09-03 ENCOUNTER — Other Ambulatory Visit: Payer: Self-pay

## 2018-09-03 DIAGNOSIS — C3490 Malignant neoplasm of unspecified part of unspecified bronchus or lung: Secondary | ICD-10-CM

## 2018-09-03 MED ORDER — GADOBUTROL 1 MMOL/ML IV SOLN
7.0000 mL | Freq: Once | INTRAVENOUS | Status: AC | PRN
  Administered 2018-09-03: 09:00:00 7 mL via INTRAVENOUS

## 2018-09-04 ENCOUNTER — Encounter: Payer: Self-pay | Admitting: Internal Medicine

## 2018-09-09 ENCOUNTER — Telehealth: Payer: Self-pay | Admitting: *Deleted

## 2018-09-09 NOTE — Telephone Encounter (Signed)
Oncology Nurse Navigator Documentation  Oncology Nurse Navigator Flowsheets 09/09/2018  Diagnosis Status -  Planned Course of Treatment -  Navigation Complete Date: -  Navigator Follow Up Date: -  Navigator Follow Up Reason: -  Navigator Location [facility]  Referral Date to RadOnc/MedOnc -  Navigator Encounter Type Telephone/I updated Dr. Julien Nordmann on his MRI brain and he states I could update patient there is no cancer.  I called and spoke with patient and updated.    Telephone Outgoing Call  Abnormal Finding Date -  Confirmed Diagnosis Date -  Treatment Initiated Date -  Patient Visit Type -  Treatment Phase Follow-up  Barriers/Navigation Needs Education  Education Other  Interventions Education  Coordination of Care -  Education Method Verbal  Support Groups/Services -  Acuity Level 2  Time Spent with Patient 30

## 2018-09-10 ENCOUNTER — Other Ambulatory Visit: Payer: Self-pay | Admitting: Cardiothoracic Surgery

## 2018-09-10 DIAGNOSIS — C3410 Malignant neoplasm of upper lobe, unspecified bronchus or lung: Secondary | ICD-10-CM

## 2018-09-11 ENCOUNTER — Ambulatory Visit
Admission: RE | Admit: 2018-09-11 | Discharge: 2018-09-11 | Disposition: A | Discharge status: Home / Self Care | Payer: Medicare Other | Source: Ambulatory Visit | Attending: Cardiothoracic Surgery | Admitting: Cardiothoracic Surgery

## 2018-09-11 ENCOUNTER — Other Ambulatory Visit: Payer: Self-pay

## 2018-09-11 ENCOUNTER — Ambulatory Visit (INDEPENDENT_AMBULATORY_CARE_PROVIDER_SITE_OTHER): Payer: Self-pay | Admitting: Cardiothoracic Surgery

## 2018-09-11 VITALS — BP 134/80 | HR 67 | Temp 97.7°F | Resp 16 | Ht 71.0 in | Wt 165.0 lb

## 2018-09-11 DIAGNOSIS — R0602 Shortness of breath: Secondary | ICD-10-CM | POA: Diagnosis not present

## 2018-09-11 DIAGNOSIS — C3410 Malignant neoplasm of upper lobe, unspecified bronchus or lung: Secondary | ICD-10-CM

## 2018-09-11 DIAGNOSIS — Z902 Acquired absence of lung [part of]: Secondary | ICD-10-CM

## 2018-09-11 DIAGNOSIS — R911 Solitary pulmonary nodule: Secondary | ICD-10-CM

## 2018-09-11 DIAGNOSIS — E291 Testicular hypofunction: Secondary | ICD-10-CM | POA: Diagnosis not present

## 2018-09-11 NOTE — Progress Notes (Signed)
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 SURGICAL PROCEDURE:  Left video-assisted thoracoscopy, left upper lobectomy with lymph node dissection, and intercostal nerve block.  Cancer Staging Lung cancer, left upper lobe [facility]) Staging form: Lung, AJCC 8th Edition - Pathologic stage from 06/23/2018: Stage IA3 (pT1c, pN0, cM0) - Signed by Grace Isaac, MD on 06/25/2018 - Clinical: No stage assigned - Unsigned   History of Present Illness:     Patient continues to progress postoperatively now about 11 weeks postop.  Still has some paresthesias over the left chest.  He is walking 30 to 40 minutes every day.  Said no fever chills or evidence of infection   Zubrod Score: At the time of surgery this patient's most appropriate activity status/level should be described as: _0     0    Normal activity, no symptoms _1     1    Restricted in physical strenuous activity but ambulatory, able to do out light work _2     2    Ambulatory and capable of self care, unable to do work activities, up and about                 >50 % of waking hours                                                                                   _3     3    Only limited self care, in bed greater than 50% of waking hours _4     4    Completely disabled, no self care, confined to bed or chair _5     5    Moribund  Social History   Tobacco Use  Smoking Status Former Smoker  . Types: Cigarettes  . Quit date: 02/01/1967  . Years since quitting: 51.6  Smokeless Tobacco Never Used        Allergies  Allergen Reactions  . Gemfibrozil Other (See Comments)    Muscle pain  . Niaspan [Niacin Er] Rash and Other (See Comments)    Flushing - aspirin did not mitigate   . Simvastatin Other (See Comments)    Drug-Drug interaction with amlodipine    Current Outpatient Medications  Medication Sig Dispense Refill  . acetaminophen (TYLENOL) 325 MG tablet Take 650 mg by mouth every 6 (six) hours as needed for moderate pain or headache.     Marland Kitchen amLODipine (NORVASC) 10 MG tablet Take 10 mg by mouth daily.    . chlorhexidine (PERIDEX) 0.12 % solution Use as directed 15 mLs in the mouth or throat 2 (two) times daily.    Marland Kitchen ibuprofen (ADVIL,MOTRIN)  200 MG tablet Take 400 mg by mouth every 8 (eight) hours as needed for headache or moderate pain.     Marland Kitchen levothyroxine (SYNTHROID) 137 MCG tablet Take 137 mcg by mouth daily before breakfast.     . LORazepam (ATIVAN) 0.5 MG tablet 1 tablet p.o. 30 minutes before MRI repeat once if needed. 2 tablet 0  . losartan (COZAAR) 100 MG tablet Take 100 mg by mouth daily.    Marland Kitchen omega-3 acid ethyl esters (LOVAZA) 1 g capsule Take 2 g by mouth 2 (two) times daily.    . pantoprazole (PROTONIX) 40 MG tablet TAKE 1 TABLET(40 MG) BY MOUTH DAILY (Patient taking differently: Take 40 mg by mouth daily. ) 90 tablet 3  . propranolol (INDERAL) 20 MG tablet Take 20 mg by mouth daily.  2  . rosuvastatin (CRESTOR) 20 MG tablet TAKE 1 TABLET(20 MG) BY MOUTH DAILY (Patient taking differently: Take 20 mg by mouth daily. ) 90 tablet 3  . traZODone (DESYREL) 100 MG tablet Take 100 mg by mouth at bedtime as needed for sleep.    Marland Kitchen zolpidem (AMBIEN) 10 MG tablet Take 10 mg by mouth at bedtime as needed for sleep.   0  . nitroGLYCERIN (NITROSTAT) 0.4 MG SL tablet Place 1 tablet (0.4 mg total) under the tongue every 5 (five) minutes as needed for chest pain. (Patient not taking: Reported on 08/04/2018) 25 tablet 12   No current facility-administered medications for this visit.         Physical Exam: BP 134/80 (BP Location: Left Arm, Patient Position: Sitting, Cuff Size: Normal)   Pulse 67   Temp 97.7 F (36.5 C)   Resp 16   Ht _0  (1.803 m)   Wt 165 lb (74.8 kg)   SpO2 95% Comment: RA  BMI 23.01 kg/m   General appearance: alert, cooperative and no distress Neck: no adenopathy, no carotid bruit, no JVD, supple, symmetrical, trachea midline and thyroid not enlarged, symmetric, no tenderness/mass/nodules Lymph nodes: Cervical, supraclavicular, and axillary nodes normal. Resp: clear to auscultation bilaterally Cardio: regular rate and rhythm, S1, S2 normal, no murmur, click, rub or gallop GI: soft, non-tender; bowel sounds normal; no masses,  no organomegaly Extremities: extremities normal, atraumatic, no cyanosis or edema and Homans sign is negative, no sign of DVT Neurologic: Grossly normal Left chest port sites chest tube sites are healing well  Diagnostic Studies & Laboratory data:         Recent Radiology Findings: Dg Chest 2 View  Result Date: 09/11/2018 CLINICAL DATA:  Follow-up of lung cancer. Shortness of breath while walking. EXAM: CHEST - 2 VIEW COMPARISON:  07/24/2018 FINDINGS: There is a small loculated anterior left hydropneumothorax. No other visible pneumothorax. Improved aeration at the left lung base. No infiltrates. Heart size and vascularity are normal. Aortic atherosclerosis. No significant bone abnormality. IMPRESSION: Small loculated anterior left hydropneumothorax. Improved aeration at the left lung base. Aortic Atherosclerosis (ICD10-I70.0). Electronically Signed   By: Lorriane Shire M.D.   On: 09/11/2018 11:11      I have independently reviewed the above radiology findings and reviewed findings  with the patient.  Recent Labs: Lab Results  Component Value Date   WBC 7.2 08/04/2018   HGB 14.7 08/04/2018   HCT 44.5 08/04/2018   PLT 207 08/04/2018   GLUCOSE 105 (H) 08/04/2018   CHOL 151 01/21/2017   TRIG 177 (H) 01/21/2017    HDL 48 01/21/2017   LDLCALC 68 01/21/2017   ALT 11 08/04/2018  AST 17 08/04/2018   NA 142 08/04/2018   K 3.8 08/04/2018   CL 105 08/04/2018   CREATININE 0.96 08/04/2018   BUN 12 08/04/2018   CO2 28 08/04/2018   TSH 10.736 (H) 01/24/2018   INR 1.1 06/20/2018   HGBA1C 5.9 (H) 06/09/2018    PATH: Diagnosis 1. Lung, resection (segmental or lobe), left upper lobe - ADENOCARCINOMA, 2.3 CM. - THREE BENIGN LYMPH NODES (0/3). - MARGINS NOT INVOLVED. 2. Lymph node, biopsy, 10 L node - ANTHRACOTIC LYMPH NODE. - NO METASTATIC CARCINOMA IDENTIFIED. 3. Lymph node, biopsy, 10 L #2 - ANTHRACOTIC LYMPH NODE. - NO METASTATIC CARCINOMA IDENTIFIED. 4. Lymph node, biopsy, 11 L node - ANTHRACOTIC LYMPH NODE. - NO METASTATIC CARCINOMA IDENTIFIED. 5. Lymph node, biopsy, 4 L node - ANTHRACOTIC LYMPH NODE. - NO METASTATIC CARCINOMA IDENTIFIED. 6. Lymph node, biopsy, 4 L #2 node - ANTHRACOTIC LYMPH NODE. - NO METASTATIC CARCINOMA IDENTIFIED. 7. Lymph node, biopsy, 11 L #2 node - ANTHRACOTIC LYMPH NODE. - NO METASTATIC CARCINOMA IDENTIFIED. 8. Lymph node, biopsy, 12 L node - ANTHRACOTIC LYMPH NODE. - NO METASTATIC CARCINOMA IDENTIFIED. 9. Lymph node, biopsy, 12 L #2 node - ANTHRACOTIC LYMPH NODE. - NO METASTATIC CARCINOMA IDENTIFIED. 10. Lymph node, biopsy, Level 8 Paraesophageal - ANTHRACOTIC LYMPH NODE. 1 of 3 FINAL for Dunn, Burman L ([id number]) Diagnosis(continued) - NO METASTATIC CARCINOMA IDENTIFIED. 11. Lymph node, biopsy, 4 L #3 node - ANTHRACOTIC LYMPH NODE. - NO METASTATIC CARCINOMA IDENTIFIED. Microscopic Comment 1. LUNG: Procedure: Left upper lobectomy and ten additional lymph node biopsies. Specimen Laterality: Left. Tumor Site: Left upper lobe. Tumor Size: 2.3 x 2.0 x 1.7 cm. Total Tumor Size Inclusive of Invasive and Lepidic Components (applies only to invasive nonmucinous adenocarcinoma with a lepidic component): 2.3 cm. Invasive Tumor Size (applies only to  invasive nonmucinous adenocarcinoma with a lepidic component: 2.3 cm. Tumor Focality: Unifocal. Histologic Type: Adenocarcinoma. Visceral Pleura Invasion: No. Lymphovascular Invasion: No. Direct Invasion of Adjacent Structures: N/A. Margins: Free of tumor. Treatment Effect: No. Regional Lymph Nodes: Number of Lymph Nodes Involved: 0. Number of Lymph Nodes Examined: 13. Pathologic Stage Classification (pTNM, AJCC 8th Edition): pTic, pN0. Ancillary Studies: Foundation 1 and PDL-1 pending. Representative Tumor Block: 1E and 25F. (JDP:ah 06/25/18) (v4.0.0.3) Claudette Laws MD  Assessment / Plan:      Patient progressing satisfactorily after left upper lobectomy node dissection for stage I adenocarcinoma of the lung.  Patient has been seen by medical oncology, at this point no further treatment other than follow-up both for the resected lesions and the groundglass opacities.   He is scheduled to have a follow-up CT in approximately 3 months and see Dr. Earlie Server at that point.  I will make an appointment to follow-up with me in 6 months.      Medication Changes: No orders of the defined types were placed in this encounter.    Grace Isaac 09/11/2018 11:54 AM

## 2018-09-12 DIAGNOSIS — R232 Flushing: Secondary | ICD-10-CM | POA: Diagnosis not present

## 2018-09-23 DIAGNOSIS — G43109 Migraine with aura, not intractable, without status migrainosus: Secondary | ICD-10-CM | POA: Diagnosis not present

## 2018-09-23 DIAGNOSIS — Z808 Family history of malignant neoplasm of other organs or systems: Secondary | ICD-10-CM | POA: Diagnosis not present

## 2018-09-23 DIAGNOSIS — Z85850 Personal history of malignant neoplasm of thyroid: Secondary | ICD-10-CM | POA: Diagnosis not present

## 2018-09-23 DIAGNOSIS — Z85118 Personal history of other malignant neoplasm of bronchus and lung: Secondary | ICD-10-CM | POA: Diagnosis not present

## 2018-09-23 DIAGNOSIS — Z85828 Personal history of other malignant neoplasm of skin: Secondary | ICD-10-CM | POA: Diagnosis not present

## 2018-09-23 DIAGNOSIS — E890 Postprocedural hypothyroidism: Secondary | ICD-10-CM | POA: Diagnosis not present

## 2018-09-23 DIAGNOSIS — R232 Flushing: Secondary | ICD-10-CM | POA: Diagnosis not present

## 2018-09-23 DIAGNOSIS — E230 Hypopituitarism: Secondary | ICD-10-CM | POA: Diagnosis not present

## 2018-09-24 ENCOUNTER — Telehealth: Payer: Self-pay | Admitting: Internal Medicine

## 2018-09-24 NOTE — Telephone Encounter (Signed)
Scheduled appt per 9/9 sch message - unable to reach pt . Left message with appt date and time

## 2018-10-02 DIAGNOSIS — Z87891 Personal history of nicotine dependence: Secondary | ICD-10-CM | POA: Diagnosis not present

## 2018-10-02 DIAGNOSIS — Z125 Encounter for screening for malignant neoplasm of prostate: Secondary | ICD-10-CM | POA: Diagnosis not present

## 2018-10-02 DIAGNOSIS — Z85118 Personal history of other malignant neoplasm of bronchus and lung: Secondary | ICD-10-CM | POA: Diagnosis not present

## 2018-10-02 DIAGNOSIS — D229 Melanocytic nevi, unspecified: Secondary | ICD-10-CM | POA: Diagnosis not present

## 2018-10-02 DIAGNOSIS — Z1159 Encounter for screening for other viral diseases: Secondary | ICD-10-CM | POA: Diagnosis not present

## 2018-10-02 DIAGNOSIS — Z0000 Encounter for general adult medical examination without abnormal findings: Secondary | ICD-10-CM | POA: Diagnosis not present

## 2018-10-02 DIAGNOSIS — Z85850 Personal history of malignant neoplasm of thyroid: Secondary | ICD-10-CM | POA: Diagnosis not present

## 2018-10-02 DIAGNOSIS — E230 Hypopituitarism: Secondary | ICD-10-CM | POA: Diagnosis not present

## 2018-10-02 DIAGNOSIS — Z86010 Personal history of colonic polyps: Secondary | ICD-10-CM | POA: Diagnosis not present

## 2018-10-02 DIAGNOSIS — I2510 Atherosclerotic heart disease of native coronary artery without angina pectoris: Secondary | ICD-10-CM | POA: Diagnosis not present

## 2018-10-02 DIAGNOSIS — E782 Mixed hyperlipidemia: Secondary | ICD-10-CM | POA: Diagnosis not present

## 2018-10-02 DIAGNOSIS — I10 Essential (primary) hypertension: Secondary | ICD-10-CM | POA: Diagnosis not present

## 2018-10-07 ENCOUNTER — Other Ambulatory Visit: Payer: Self-pay | Admitting: Family Medicine

## 2018-10-07 DIAGNOSIS — Z87891 Personal history of nicotine dependence: Secondary | ICD-10-CM

## 2018-10-08 ENCOUNTER — Other Ambulatory Visit: Payer: Medicare Other

## 2018-10-08 ENCOUNTER — Encounter: Payer: Medicare Other | Admitting: Genetic Counselor

## 2018-10-14 DIAGNOSIS — Z23 Encounter for immunization: Secondary | ICD-10-CM | POA: Diagnosis not present

## 2018-10-20 ENCOUNTER — Telehealth: Payer: Self-pay | Admitting: Internal Medicine

## 2018-10-20 NOTE — Telephone Encounter (Signed)
Returned patient's phone call regarding rescheduling an appointment, left a voicemail. 

## 2018-10-22 ENCOUNTER — Telehealth: Payer: Self-pay | Admitting: Genetic Counselor

## 2018-10-22 ENCOUNTER — Other Ambulatory Visit: Payer: Self-pay | Admitting: Licensed Clinical Social Worker

## 2018-10-22 ENCOUNTER — Telehealth: Payer: Self-pay | Admitting: Licensed Clinical Social Worker

## 2018-10-22 DIAGNOSIS — Z85850 Personal history of malignant neoplasm of thyroid: Secondary | ICD-10-CM

## 2018-10-22 DIAGNOSIS — Z809 Family history of malignant neoplasm, unspecified: Secondary | ICD-10-CM

## 2018-10-22 NOTE — Telephone Encounter (Signed)
Called patient regarding upcoming Webex appointment, left a voicemail. This will be a walk-in visit.

## 2018-10-23 ENCOUNTER — Inpatient Hospital Stay: Payer: Medicare Other

## 2018-10-23 ENCOUNTER — Inpatient Hospital Stay: Payer: Medicare Other | Admitting: Licensed Clinical Social Worker

## 2018-10-23 ENCOUNTER — Telehealth: Payer: Self-pay | Admitting: Internal Medicine

## 2018-10-23 NOTE — Telephone Encounter (Signed)
Returned patient call re rescheduling 10/15 appointment. Confirmed new appointment for 11/10 with patient. Appointment will be webex.

## 2018-10-25 DIAGNOSIS — I10 Essential (primary) hypertension: Secondary | ICD-10-CM | POA: Diagnosis not present

## 2018-10-25 DIAGNOSIS — E890 Postprocedural hypothyroidism: Secondary | ICD-10-CM | POA: Diagnosis not present

## 2018-10-25 DIAGNOSIS — E782 Mixed hyperlipidemia: Secondary | ICD-10-CM | POA: Diagnosis not present

## 2018-10-25 DIAGNOSIS — I2510 Atherosclerotic heart disease of native coronary artery without angina pectoris: Secondary | ICD-10-CM | POA: Diagnosis not present

## 2018-10-25 DIAGNOSIS — Z85118 Personal history of other malignant neoplasm of bronchus and lung: Secondary | ICD-10-CM | POA: Diagnosis not present

## 2018-10-27 ENCOUNTER — Other Ambulatory Visit: Payer: Self-pay | Admitting: Cardiology

## 2018-10-29 DIAGNOSIS — J309 Allergic rhinitis, unspecified: Secondary | ICD-10-CM | POA: Diagnosis not present

## 2018-11-06 DIAGNOSIS — R1110 Vomiting, unspecified: Secondary | ICD-10-CM | POA: Diagnosis not present

## 2018-11-17 ENCOUNTER — Telehealth: Payer: Self-pay | Admitting: Genetic Counselor

## 2018-11-17 NOTE — Telephone Encounter (Signed)
Called patient regarding upcoming Webex appointment, patient is notified and e-mail has been sent. °

## 2018-11-18 ENCOUNTER — Inpatient Hospital Stay: Payer: Medicare Other | Attending: Genetic Counselor | Admitting: Genetic Counselor

## 2018-11-18 ENCOUNTER — Inpatient Hospital Stay: Payer: Medicare Other

## 2018-11-18 DIAGNOSIS — Z1379 Encounter for other screening for genetic and chromosomal anomalies: Secondary | ICD-10-CM

## 2018-11-18 DIAGNOSIS — D440 Neoplasm of uncertain behavior of thyroid gland: Secondary | ICD-10-CM

## 2018-11-18 DIAGNOSIS — C3410 Malignant neoplasm of upper lobe, unspecified bronchus or lung: Secondary | ICD-10-CM

## 2018-11-18 DIAGNOSIS — Z800 Family history of malignant neoplasm of digestive organs: Secondary | ICD-10-CM

## 2018-11-19 ENCOUNTER — Encounter: Payer: Self-pay | Admitting: Genetic Counselor

## 2018-11-19 ENCOUNTER — Ambulatory Visit: Payer: Medicare Other

## 2018-11-19 DIAGNOSIS — Z800 Family history of malignant neoplasm of digestive organs: Secondary | ICD-10-CM | POA: Insufficient documentation

## 2018-11-19 NOTE — Progress Notes (Signed)
REFERRING PROVIDER: Delrae Rend, MD [address] [place],  [place] [address]  PRIMARY PROVIDER:  [name], MD  PRIMARY REASON FOR VISIT:  1. Malignant neoplasm of upper lobe of lung, unspecified laterality ([place])   2. Family history of stomach cancer   3. Family history of colon cancer   4. Neoplasm of uncertain behavior of thyroid gland      I connected with Mr. Middlesworth on 11/19/2018 at 9:00 am EDT by [facility] video conference and verified that I am speaking with the correct person using two identifiers.   Patient location: home Provider location: office  HISTORY OF PRESENT ILLNESS:   Mr. Miguel Wolfe, a 73 y.o. male, was seen for a [place] cancer genetics consultation at the request of Dr. Buddy Duty due to a personal and family history of cancer.  Mr. Kron presents to clinic today to discuss the possibility of a hereditary predisposition to cancer, genetic testing, and to further clarify his future cancer risks, as well as potential cancer risks for family members.   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 Later, in June, he was diagnosed non-small cell lung cancer. 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   Of note, Mr. 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   CANCER HISTORY:  Oncology History  Lung cancer, 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[place])  06/23/2018 Cancer Staging   Staging form: Lung, AJCC 8th Edition - Pathologic stage from 06/23/2018: Stage IA3 (pT1c, pN0, cM0) - Signed by Grace Isaac, MD on 06/25/2018   06/25/2018 Initial Diagnosis   Lung cancer, left upper lobe [facility])       Past Medical History:  Diagnosis Date  . Anginal pain ([place])   . Arthritis   . Colon polyps   . Coronary artery disease involving native coronary artery of native heart without angina pectoris 04/23/2016   Myoview 02/08/16: EF 51, inf-lat, apical  lateral scar with peri-infarct ischemia; intermediate risk  // LHC 2/18: dLAD 20, oD1 20, pLCx 20, pRCA 80, mRCA 20, dRCA 30, AM 90 >> PCI: 4 x 24 mm Synergy DES to pRCA   . Family history of adverse reaction to anesthesia    Mom vomits and Dad went "nuts"  . Family history of colon cancer   . Family history of stomach cancer   . Fatigue   . GERD (gastroesophageal reflux disease)   . Hemorrhoids   . History of echocardiogram    Echo 02/08/16: Mild LVH, EF 55-60, no RWMA, Gr 1 DD, trivial AI, mild LAE  . Hyperlipidemia   . Hypertension   . PONV (postoperative nausea and vomiting)   . Pre-diabetes   . Pulmonary nodules     Past Surgical History:  Procedure Laterality Date  . CARDIAC CATHETERIZATION    . COLONOSCOPY WITH ESOPHAGOGASTRODUODENOSCOPY (EGD)  2008  . CORONARY STENT INTERVENTION N/A 02/20/2016   Procedure: Coronary Stent Intervention;  Surgeon: Burnell Blanks, MD;  Location: [place] CV [facility];  Service: Cardiovascular;  Laterality: N/A;  . ESOPHAGOGASTRODUODENOSCOPY    . LEFT HEART CATH AND CORONARY ANGIOGRAPHY N/A 02/20/2016   Procedure: Left Heart Cath and Coronary Angiography;  Surgeon: Burnell Blanks, MD;  Location: [place] [facility];  Service: Cardiovascular;  Laterality: N/A;  . PILONIDAL CYCT    . REFRACTIVE SURGERY     retina tear repair  .  THYROIDECTOMY N/A 01/17/2018   Procedure: TOTAL THYROIDECTOMY;  Surgeon: Armandina Gemma, MD;  Location: [facility];  Service: General;  Laterality: N/A;  . VIDEO ASSISTED THORACOSCOPY (VATS)/WEDGE RESECTION Left 06/24/2018   Procedure: VIDEO ASSISTED THORACOSCOPY (VATS)/LUNG RESECTION;  Surgeon: Grace Isaac, MD;  Location: [place];  Service: Thoracic;  Laterality: Left;  Marland Kitchen VIDEO BRONCHOSCOPY WITH ENDOBRONCHIAL NAVIGATION N/A 11/18/2017   Procedure: VIDEO BRONCHOSCOPY WITH ENDOBRONCHIAL NAVIGATION WITH BIOPSIES OF LEFT AND RIGHT UPPER LOBES;  Surgeon: Grace Isaac, MD;  Location: [place];  Service: Thoracic;   Laterality: N/A;  . VIDEO BRONCHOSCOPY WITH ENDOBRONCHIAL NAVIGATION N/A 06/11/2018   Procedure: VIDEO BRONCHOSCOPY WITH ENDOBRONCHIAL NAVIGATION;  Surgeon: Grace Isaac, MD;  Location: [place];  Service: Thoracic;  Laterality: N/A;    Social History   Socioeconomic History  . Marital status: Married    Spouse name: Not on file  . Number of children: Not on file  . Years of education: Not on file  . Highest education level: Not on file  Occupational History  . Not on file  Social Needs  . Financial resource strain: Not on file  . Food insecurity    Worry: Not on file    Inability: Not on file  . Transportation needs    Medical: Not on file    Non-medical: Not on file  Tobacco Use  . Smoking status: Former Smoker    Types: Cigarettes    Quit date: 02/01/1967    Years since quitting: 51.8  . Smokeless tobacco: Never Used  Substance and Sexual Activity  . Alcohol use: Yes    Comment: a drink before dinner a few times a week  . Drug use: No  . Sexual activity: Not on file  Lifestyle  . Physical activity    Days per week: Not on file    Minutes per session: Not on file  . Stress: Not on file  Relationships  . Social Herbalist on phone: Not on file    Gets together: Not on file    Attends religious service: Not on file    Active member of club or organization: Not on file    Attends meetings of clubs or organizations: Not on file    Relationship status: Not on file  Other Topics Concern  . Not on file  Social History Narrative  . Not on file     FAMILY HISTORY:  We obtained a detailed, 4-generation family history.  Significant diagnoses are listed below: Family History  Problem Relation Age of Onset  . Heart attack Father 32       BYPASS  . Crohn's disease Sister   . Stomach cancer Paternal Uncle        diagnosed mid-30s  . Healthy Sister   . Throat cancer Paternal Uncle        hx of smoking  . Cancer Paternal Uncle        unknown type  .  Cancer Paternal Uncle        unknown type  . Colon cancer Cousin 73       paternal cousin   Mr. Capozzi has two daughters - Nira Conn, age 77, and Sonia Baller, age 64. He has two sisters ages 58 and 34. None of these individuals have had cancer.  Mr. Brodzinski mother is currently living at age 51, and has not had cancer. He has four maternal uncles and four maternal aunts, none of whom have had cancer. His maternal  grandmother died at age 28, and maternal grandfather died in his early 36s.  Mr. Guiney father died at the age of 20, without a history of cancer. He has seven paternal uncles and three paternal aunts. One of his uncles died form stomach cancer in his mid-75s. Another uncle had throat cancer, and two other uncles had an unknown type of cancer. Mr. Klug also has a paternal first cousin who had colon cancer at the age of 41. His paternal grandmother died in her early 15s, and his grandfather died at age 21.   Mr. Steward is unaware of previous family history of genetic testing for hereditary cancer risks. Patient's maternal ancestors are of [place] and [place] descent, and paternal ancestors are of [place], [place], and Native American descent. There is no reported Ashkenazi Jewish ancestry. There is no known consanguinity.  GENETIC COUNSELING ASSESSMENT: Mr. Achterberg is a 73 y.o. 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 We, therefore, discussed and recommended the following at today's visit.   DISCUSSION:  Approximately 5-10% of cancer is hereditary. We discussed that the majority of lung cancer does not have an identifiable hereditary cause. However, Mr. 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 Inherited mutations in the CHEK2 gene are known to increase  the risk for breast, colon, and prostate cancers. It is unknown if the identified mutation is present only in his tumor cells, or if it was an inherited (germline) mutation.  It has been suggested that variants identified in somatic testing which are common germline founder mutations, like the c.1100delC variant in the CHEK2 gene that was identified in Mr. Stupka somatic testing, are at a higher likelihood to be of germline origin. For CHEK2, specifically, it is estimated that the percentage of somatic mutations being of germline origin is greater than 25% (PMID: 84696295). Therefore, data suggests that germline testing for these somatically-detected variants is warranted.  We discussed that testing and identifying a hereditary cancer syndrome is beneficial for several reasons including knowing about other cancer risks, identifying potential screening and risk-reduction options that may be appropriate, and to understand if other family members could be at risk for cancer and allow them to undergo genetic testing.   We reviewed the characteristics, features and inheritance patterns of hereditary cancer syndromes. We also discussed genetic testing, including the appropriate family members to test, the process of testing, insurance coverage and turn-around-time for results. We discussed the implications of a negative, positive and/or variant of uncertain significant result. We recommended Mr. River pursue genetic testing for the CHEK2 gene. He will then have the option to reflex to additional genetic testing, if he desires.  Based on Mr. Maselli personal and family history of cancer, he does not meet NCCN criteria for genetic testing. However, as discussed above, genetic testing is warranted in his case due to his somatic tumor findings. Despite this, he may still have an out of pocket cost. We discussed that if his out of pocket cost for testing is over $100, the laboratory will call and confirm whether he  wants to proceed with testing.  If the out of pocket cost of testing is less than $100 he will be billed by the genetic testing laboratory.   We discussed that some people do not want to  undergo genetic testing due to fear of genetic discrimination.  A federal law called the Genetic Information Non-Discrimination Act (GINA) of 2008 helps protect individuals against genetic discrimination based on their genetic test results.  It impacts both health insurance and employment.  With health insurance, it protects against increased premiums, being kicked off insurance or being forced to take a test in order to be insured.  For employment it protects against hiring, firing and promoting decisions based on genetic test results.  Health status due to a cancer diagnosis is not protected under GINA.  Additionally, life, disability, and long-term care insurance is not protected under GINA.   PLAN: After considering the risks, benefits, and limitations, Mr. Franchini provided informed consent to pursue genetic testing. He will receive a saliva kit in the mail that he will be responsible for sending back to [facility] for analysis of the CHEK2 gene. Once the laboratory receives the sample results should be available within approximately one-two weeks' time, at which point they will be disclosed by telephone to Mr. Morison, as will any additional recommendations warranted by these results. Mr. Iser will receive a summary of his genetic counseling visit and a copy of his results once available. This information will also be available in Epic.   Mr. Enis questions were answered to his satisfaction today. Our contact information was provided should additional questions or concerns arise. Thank you for the referral and allowing [place] to share in the care of your patient.   Clint Guy, MS, [facility] Certified Genetic Counselor [email or web address] Phone: [phone]  The patient was seen for a total of 50  minutes in face-to-face genetic counseling.  This patient was discussed with Drs. Magrinat, Lindi Adie and/or Burr Medico who agrees with the above.    _______________________________________________________________________ For Office Staff:  Number of people involved in session: 1 Was an Intern/ student involved with case: no

## 2018-12-02 ENCOUNTER — Other Ambulatory Visit: Payer: Self-pay

## 2018-12-02 ENCOUNTER — Telehealth (INDEPENDENT_AMBULATORY_CARE_PROVIDER_SITE_OTHER): Payer: Medicare Other | Admitting: Cardiology

## 2018-12-02 ENCOUNTER — Encounter: Payer: Self-pay | Admitting: Cardiology

## 2018-12-02 VITALS — BP 138/74 | HR 68 | Temp 97.7°F | Ht 71.0 in | Wt 161.0 lb

## 2018-12-02 DIAGNOSIS — R0602 Shortness of breath: Secondary | ICD-10-CM

## 2018-12-02 DIAGNOSIS — E7800 Pure hypercholesterolemia, unspecified: Secondary | ICD-10-CM | POA: Diagnosis not present

## 2018-12-02 DIAGNOSIS — I209 Angina pectoris, unspecified: Secondary | ICD-10-CM | POA: Diagnosis not present

## 2018-12-02 DIAGNOSIS — I2510 Atherosclerotic heart disease of native coronary artery without angina pectoris: Secondary | ICD-10-CM | POA: Diagnosis not present

## 2018-12-02 DIAGNOSIS — C3490 Malignant neoplasm of unspecified part of unspecified bronchus or lung: Secondary | ICD-10-CM

## 2018-12-02 DIAGNOSIS — I10 Essential (primary) hypertension: Secondary | ICD-10-CM | POA: Diagnosis not present

## 2018-12-02 NOTE — Patient Instructions (Signed)
Medication Instructions:  The current medical regimen is effective;  continue present plan and medications.  *If you need a refill on your cardiac medications before your next appointment, please call your pharmacy*  Testing/Procedures: Your physician has requested that you have an echocardiogram. Echocardiography is a painless test that uses sound waves to create images of your heart. It provides your doctor with information about the size and shape of your heart and how well your heart's chambers and valves are working. This procedure takes approximately one hour. There are no restrictions for this procedure.  You will contacted to be scheduled for this appointment.  It will be completed here at our [address], [place], [place] location.  Follow-Up: At [facility], you and your health needs are our priority.  As part of our continuing mission to provide you with exceptional heart care, we have created designated Provider Care Teams.  These Care Teams include your primary Cardiologist (physician) and Advanced Practice Providers (APPs -  Physician Assistants and Nurse Practitioners) who all work together to provide you with the care you need, when you need it.  Your next appointment:   1 year(s)  The format for your next appointment:   In Person  Provider:   Candee Furbish, MD  Thank you for choosing [facility]!!

## 2018-12-02 NOTE — Progress Notes (Signed)
Virtual Visit via Video Note   This visit type was conducted due to national recommendations for restrictions regarding the COVID-19 Pandemic (e.g. social distancing) in an effort to limit this patient's exposure and mitigate transmission in our community.  Due to his co-morbid illnesses, this patient is at least at moderate risk for complications without adequate follow up.  This format is felt to be most appropriate for this patient at this time.  All issues noted in this document were discussed and addressed.  A limited physical exam was performed with this format.  Please refer to the patient's chart for his consent to telehealth for [facility].   Date:  12/02/2018   ID:  Miguel Wolfe, DOB 1945-05-05, MRN [id number]  Patient Location: Home Provider Location: Home  PCP:  [name], MD  Cardiologist:  Candee Furbish, MD  Electrophysiologist:  None   Evaluation Performed:  Follow-Up Visit  Chief Complaint:    History of Present Illness:    Miguel Wolfe is a 73 y.o. male with left upper lobectomy node dissection for stage I adenocarcinoma of the lung by Dr. Servando Snare here for follow-up.  Mild SOB, drags when breathe. 30 min walk  Back in February 2018 had stent placement for coronary artery disease.  Subsequent stress test showed small defect of mild severity in the apical inferior and apical lateral region.  Normal ejection fraction.  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  Dr. Buddy Duty has been following.  The patient does not have symptoms concerning for COVID-19 infection (fever, chills, cough, or new shortness of breath).    Past Medical History:  Diagnosis Date  . Anginal pain ([place])   . Arthritis   . Colon polyps   . Coronary artery disease involving native coronary artery of native heart without angina pectoris 04/23/2016   Myoview 02/08/16: EF 51, inf-lat, apical lateral scar with peri-infarct ischemia;  intermediate risk  // LHC 2/18: dLAD 20, oD1 20, pLCx 20, pRCA 80, mRCA 20, dRCA 30, AM 90 >> PCI: 4 x 24 mm Synergy DES to pRCA   . Family history of adverse reaction to anesthesia    Mom vomits and Dad went "nuts"  . Family history of colon cancer   . Family history of stomach cancer   . Fatigue   . GERD (gastroesophageal reflux disease)   . Hemorrhoids   . History of echocardiogram    Echo 02/08/16: Mild LVH, EF 55-60, no RWMA, Gr 1 DD, trivial AI, mild LAE  . Hyperlipidemia   . Hypertension   . PONV (postoperative nausea and vomiting)   . Pre-diabetes   . Pulmonary nodules    Past Surgical History:  Procedure Laterality Date  . CARDIAC CATHETERIZATION    . COLONOSCOPY WITH ESOPHAGOGASTRODUODENOSCOPY (EGD)  2008  . CORONARY STENT INTERVENTION N/A 02/20/2016   Procedure: Coronary Stent Intervention;  Surgeon: Burnell Blanks, MD;  Location: [facility];  Service: Cardiovascular;  Laterality: N/A;  . ESOPHAGOGASTRODUODENOSCOPY    . LEFT HEART CATH AND CORONARY ANGIOGRAPHY N/A 02/20/2016   Procedure: Left Heart Cath and Coronary Angiography;  Surgeon: Burnell Blanks, MD;  Location: [facility];  Service: Cardiovascular;  Laterality: N/A;  . PILONIDAL CYCT    . REFRACTIVE SURGERY     retina tear repair  . THYROIDECTOMY N/A 01/17/2018   Procedure: TOTAL THYROIDECTOMY;  Surgeon: Armandina Gemma, MD;  Location: [facility];  Service: General;  Laterality:  N/A;  . VIDEO ASSISTED THORACOSCOPY (VATS)/WEDGE RESECTION Left 06/24/2018   Procedure: VIDEO ASSISTED THORACOSCOPY (VATS)/LUNG RESECTION;  Surgeon: Grace Isaac, MD;  Location: [facility];  Service: Thoracic;  Laterality: Left;  Marland Kitchen VIDEO BRONCHOSCOPY WITH ENDOBRONCHIAL NAVIGATION N/A 11/18/2017   Procedure: VIDEO BRONCHOSCOPY WITH ENDOBRONCHIAL NAVIGATION WITH BIOPSIES OF LEFT AND RIGHT UPPER LOBES;  Surgeon: Grace Isaac, MD;  Location: [place];  Service: Thoracic;  Laterality: N/A;  . VIDEO BRONCHOSCOPY WITH  ENDOBRONCHIAL NAVIGATION N/A 06/11/2018   Procedure: VIDEO BRONCHOSCOPY WITH ENDOBRONCHIAL NAVIGATION;  Surgeon: Grace Isaac, MD;  Location: [facility];  Service: Thoracic;  Laterality: N/A;     Current Meds  Medication Sig  . acetaminophen (TYLENOL) 325 MG tablet Take 650 mg by mouth every 6 (six) hours as needed for moderate pain or headache.   Marland Kitchen amLODipine (NORVASC) 10 MG tablet Take 10 mg by mouth daily.  Marland Kitchen aspirin EC 81 MG tablet Take 81 mg by mouth daily.  Marland Kitchen ibuprofen (ADVIL,MOTRIN) 200 MG tablet Take 400 mg by mouth every 8 (eight) hours as needed for headache or moderate pain.   Marland Kitchen levothyroxine (SYNTHROID) 137 MCG tablet Take 137 mcg by mouth daily before breakfast.   . losartan (COZAAR) 100 MG tablet Take 100 mg by mouth daily.  . nitroGLYCERIN (NITROSTAT) 0.4 MG SL tablet Place 1 tablet (0.4 mg total) under the tongue every 5 (five) minutes as needed for chest pain.  Marland Kitchen omega-3 acid ethyl esters (LOVAZA) 1 g capsule Take 2 g by mouth 2 (two) times daily.  . pantoprazole (PROTONIX) 40 MG tablet Take 40 mg by mouth daily.  . propranolol (INDERAL) 20 MG tablet Take 20 mg by mouth daily.  . rosuvastatin (CRESTOR) 20 MG tablet TAKE 1 TABLET(20 MG) BY MOUTH DAILY  . traZODone (DESYREL) 100 MG tablet Take 100 mg by mouth at bedtime as needed for sleep.  Marland Kitchen zolpidem (AMBIEN) 10 MG tablet Take 10 mg by mouth at bedtime as needed for sleep.      Allergies:   Gemfibrozil, Niaspan [niacin er], and Simvastatin   Social History   Tobacco Use  . Smoking status: Former Smoker    Types: Cigarettes    Quit date: 02/01/1967    Years since quitting: 51.8  . Smokeless tobacco: Never Used  Substance Use Topics  . Alcohol use: Yes    Comment: a drink before dinner a few times a week  . Drug use: No     Family Hx: The patient's family history includes Cancer in his paternal uncle and paternal uncle; Colon cancer (age of onset: 37) in his cousin; Crohn's disease in his sister; Healthy in his  sister; Heart attack (age of onset: 48) in his father; Stomach cancer in his paternal uncle; Throat cancer in his paternal uncle.  ROS:   Please see the history of present illness.    No fevers chills nausea vomiting syncope bleeding All other systems reviewed and are negative.   Prior CV studies:   The following studies were reviewed today:  ECHO 2018 - Left ventricle: The cavity size was normal. Wall thickness was   increased in a pattern of mild LVH. Systolic function was normal.   The estimated ejection fraction was in the range of 55% to 60%.   Wall motion was normal; there were no regional wall motion   abnormalities. Doppler parameters are consistent with abnormal   left ventricular relaxation (grade 1 diastolic dysfunction). - Aortic valve: There was trivial regurgitation. -  Left atrium: The atrium was mildly dilated. - Atrial septum: No defect or patent foramen ovale was identified.  Labs/Other Tests and Data Reviewed:    EKG:  An ECG dated 06/20/2018 was personally reviewed today and demonstrated:  Sinus rhythm 67 no other abnormalities  Recent Labs: 01/24/2018: Magnesium 2.6; TSH 10.736 08/04/2018: ALT 11; BUN 12; Creatinine 0.96; Hemoglobin 14.7; Platelet Count 207; Potassium 3.8; Sodium 142   Recent Lipid Panel Lab Results  Component Value Date/Time   CHOL 151 01/21/2017 08:40 AM   TRIG 177 (H) 01/21/2017 08:40 AM   HDL 48 01/21/2017 08:40 AM   CHOLHDL 3.1 01/21/2017 08:40 AM   LDLCALC 68 01/21/2017 08:40 AM    Wt Readings from Last 3 Encounters:  12/02/18 161 lb (73 kg)  09/11/18 165 lb (74.8 kg)  08/04/18 168 lb 1.6 oz (76.2 kg)     Objective:    Vital Signs:  BP 138/74   Pulse 68   Temp 97.7 F (36.5 C)   Ht 5\' 11"  (1.803 m)   Wt 161 lb (73 kg)   SpO2 98%   BMI 22.45 kg/m    VITAL SIGNS:  reviewed GEN:  no acute distress EYES:  sclerae anicteric, EOMI - Extraocular Movements Intact RESPIRATORY:  normal respiratory effort, symmetric expansion  SKIN:  no rash, lesions or ulcers. MUSCULOSKELETAL:  no obvious deformities. NEURO:  alert and oriented x 3, no obvious focal deficit PSYCH:  normal affect  ASSESSMENT & PLAN:    Coronary artery disease with angina - RCA stent 02/20/2016 after inferior lateral MI with peri-infarct ischemia.  He was experiencing dyspnea on exertion after performing yard work.  Father also had CAD. After his lung resection he does feel some mild dragging with his breathing.  We will check an echocardiogram.  Overall no aggressive anginal symptoms.  Stage I adenocarcinoma of lung post resection -Status post resection Dr. Servando Snare.  Doing well.  Still feeling some sensation of mild shortness of breath.  We will check an echocardiogram to ensure normal structure and function, pulmonary pressures.  It has been since 2018.  Hyperlipidemia - Prior myalgias with pravastatin.  Crestor seems to be doing a good job.  Prior LDL 68.  Continue with exercise.  [name] has been watching his lipids.  Doing very well.  No myalgias.  Essential hypertension - Medications reviewed.  No changes.  Excellent.  Papillary thyroid cancer -Resection.  Dr. Buddy Duty has been monitoring and adjusting his Synthroid.  Doing well.   COVID-19 Education: The signs and symptoms of COVID-19 were discussed with the patient and how to seek care for testing (follow up with PCP or arrange E-visit).  The importance of social distancing was discussed today.  Time:   Today, I have spent 18 minutes with the patient with telehealth technology discussing the above problems.     Medication Adjustments/Labs and Tests Ordered: Current medicines are reviewed at length with the patient today.  Concerns regarding medicines are outlined above.   Tests Ordered: Orders Placed This Encounter  Procedures  . ECHOCARDIOGRAM COMPLETE    Medication Changes: No orders of the defined types were placed in this encounter.   Follow Up:  In Person in 1  year(s)  Signed, Candee Furbish, MD  12/02/2018 9:04 AM    [place]

## 2018-12-06 ENCOUNTER — Other Ambulatory Visit: Payer: Self-pay | Admitting: Cardiology

## 2018-12-15 ENCOUNTER — Other Ambulatory Visit (HOSPITAL_COMMUNITY): Payer: Medicare Other

## 2018-12-25 ENCOUNTER — Telehealth: Payer: Self-pay | Admitting: Genetic Counselor

## 2018-12-25 NOTE — Telephone Encounter (Signed)
Called to check in regarding the status of Miguel Wolfe genetic testing. He sent his saliva kit in the mail on Monday, 12/14. We will reach out to him once we have his results.

## 2019-01-12 ENCOUNTER — Telehealth: Payer: Self-pay | Admitting: Genetic Counselor

## 2019-01-12 DIAGNOSIS — Z1501 Genetic susceptibility to malignant neoplasm of breast: Secondary | ICD-10-CM | POA: Insufficient documentation

## 2019-01-12 DIAGNOSIS — Z1509 Genetic susceptibility to other malignant neoplasm: Secondary | ICD-10-CM | POA: Insufficient documentation

## 2019-01-12 NOTE — Telephone Encounter (Signed)
LVM that his genetic test results are available and requested that he call back to discuss them.

## 2019-01-13 NOTE — Telephone Encounter (Signed)
Disclosed positive genetic test result. 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 Pathogenic variants in CHEK2 are known to increase the risk for breast, colon, and prostate cancer. We scheduled a virtual appointment on Thursday, 01/15/19, at 10am to discuss this result and its recommendations in more detail.

## 2019-01-14 DIAGNOSIS — E890 Postprocedural hypothyroidism: Secondary | ICD-10-CM | POA: Diagnosis not present

## 2019-01-14 DIAGNOSIS — I2510 Atherosclerotic heart disease of native coronary artery without angina pectoris: Secondary | ICD-10-CM | POA: Diagnosis not present

## 2019-01-14 DIAGNOSIS — E782 Mixed hyperlipidemia: Secondary | ICD-10-CM | POA: Diagnosis not present

## 2019-01-14 DIAGNOSIS — Z85118 Personal history of other malignant neoplasm of bronchus and lung: Secondary | ICD-10-CM | POA: Diagnosis not present

## 2019-01-14 DIAGNOSIS — I10 Essential (primary) hypertension: Secondary | ICD-10-CM | POA: Diagnosis not present

## 2019-01-15 ENCOUNTER — Inpatient Hospital Stay: Payer: Medicare Other | Attending: Internal Medicine | Admitting: Genetic Counselor

## 2019-01-15 ENCOUNTER — Other Ambulatory Visit (HOSPITAL_COMMUNITY): Payer: Medicare Other

## 2019-01-15 ENCOUNTER — Encounter: Payer: Self-pay | Admitting: Genetic Counselor

## 2019-01-15 DIAGNOSIS — I2510 Atherosclerotic heart disease of native coronary artery without angina pectoris: Secondary | ICD-10-CM | POA: Insufficient documentation

## 2019-01-15 DIAGNOSIS — Z79899 Other long term (current) drug therapy: Secondary | ICD-10-CM | POA: Insufficient documentation

## 2019-01-15 DIAGNOSIS — Z1589 Genetic susceptibility to other disease: Secondary | ICD-10-CM

## 2019-01-15 DIAGNOSIS — K219 Gastro-esophageal reflux disease without esophagitis: Secondary | ICD-10-CM | POA: Insufficient documentation

## 2019-01-15 DIAGNOSIS — Z791 Long term (current) use of non-steroidal anti-inflammatories (NSAID): Secondary | ICD-10-CM | POA: Insufficient documentation

## 2019-01-15 DIAGNOSIS — I10 Essential (primary) hypertension: Secondary | ICD-10-CM | POA: Insufficient documentation

## 2019-01-15 DIAGNOSIS — Z1509 Genetic susceptibility to other malignant neoplasm: Secondary | ICD-10-CM

## 2019-01-15 DIAGNOSIS — C3412 Malignant neoplasm of upper lobe, left bronchus or lung: Secondary | ICD-10-CM | POA: Insufficient documentation

## 2019-01-15 DIAGNOSIS — J449 Chronic obstructive pulmonary disease, unspecified: Secondary | ICD-10-CM | POA: Insufficient documentation

## 2019-01-15 DIAGNOSIS — Z1379 Encounter for other screening for genetic and chromosomal anomalies: Secondary | ICD-10-CM

## 2019-01-15 DIAGNOSIS — E785 Hyperlipidemia, unspecified: Secondary | ICD-10-CM | POA: Insufficient documentation

## 2019-01-15 DIAGNOSIS — Z7982 Long term (current) use of aspirin: Secondary | ICD-10-CM | POA: Insufficient documentation

## 2019-01-15 DIAGNOSIS — M1990 Unspecified osteoarthritis, unspecified site: Secondary | ICD-10-CM | POA: Insufficient documentation

## 2019-01-15 DIAGNOSIS — Z800 Family history of malignant neoplasm of digestive organs: Secondary | ICD-10-CM | POA: Insufficient documentation

## 2019-01-15 DIAGNOSIS — Z1503 Genetic susceptibility to malignant neoplasm of prostate: Secondary | ICD-10-CM

## 2019-01-15 DIAGNOSIS — Z1501 Genetic susceptibility to malignant neoplasm of breast: Secondary | ICD-10-CM

## 2019-01-15 DIAGNOSIS — Z902 Acquired absence of lung [part of]: Secondary | ICD-10-CM | POA: Insufficient documentation

## 2019-01-15 NOTE — Progress Notes (Signed)
GENETIC TEST RESULTS   Patient Name: Miguel Wolfe Patient Age: 74 y.o. Encounter Date: 01/15/2019  I connected with Mr. Miguel Wolfe on 01/15/2019 at 10:00 am EDT by MyChart video conference and verified that I am speaking with the correct person using two identifiers.   Patient location: home Provider location: office    Miguel Wolfe was initially seen in the [facility] clinic on 11/18/2018 due to a personal and family history of cancer and concern regarding a hereditary predisposition to cancer in the family. Please refer to the prior Genetics clinic note for more information regarding Mr. Miguel Wolfe and family histories and our assessment at the time.   FAMILY HISTORY:  We obtained a detailed, 4-generation family history.  Significant diagnoses are listed below: Family History  Problem Relation Age of Onset  . Heart attack Father 36       BYPASS  . Crohn's disease Sister   . Stomach cancer Paternal Uncle        diagnosed mid-30s  . Healthy Sister   . Throat cancer Paternal Uncle        hx of smoking  . Cancer Paternal Uncle        unknown type  . Cancer Paternal Uncle        unknown type  . Colon cancer Cousin 73       paternal cousin  . Thyroid cancer Daughter        papillary    Miguel Wolfe has two daughters - Nira Conn, age 72, and Sonia Baller, age 60. One of his daughters, Nira Conn, has a history of papillary thyroid cancer. He has two sisters ages 59 and 30. None of these individuals have had cancer.  Miguel Wolfe mother is currently living at age 29, and has not had cancer. He has four maternal uncles and four maternal aunts, none of whom have had cancer. His maternal grandmother died at age 13, and maternal grandfather died in his early 49s.  Miguel Wolfe father died at the age of 31, without a history of cancer. He has seven paternal uncles and three paternal aunts. One of his uncles died form stomach cancer in his mid-47s. Another uncle had throat cancer, and two other  uncles had an unknown type of cancer. Miguel Wolfe also has a paternal first cousin who had colon cancer at the age of 54. His paternal grandmother died in her early 43s, and his grandfather died at age 58.   Miguel Wolfe is unaware of previous family history of genetic testing for hereditary cancer risks. His maternal ancestors are of [place] descent, and paternal ancestors are of [place], [place], and Native American descent. There is no reported Ashkenazi Jewish ancestry. There is no known consanguinity.  GENETIC TESTING:  At the time of Miguel Wolfe visit, we recommended he pursue germline genetic testing of the CHEK2 gene based on the CHEK2 mutation detected on his prior tumor testing. The genetic testing reported out on 01/12/2019 through [place] [facility].  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     DISCUSSION: CHEK2 mutations have been found to be associated with an increased risk of breast, colon, prostate, and possibly other cancers. These estimated cancer risks vary widely and may be influenced by family history. Women with a CHEK2 deleterious mutation have approximately a 24% (no family history of breast cancer) to 48% (strong family history of breast cancer) lifetime risk of breast cancer and up to a 25%  risk of a second breast cancer. Men may have an increased risk for male breast cancer of about 1%. Men and women may have an increased risk of colon cancer and thyroid cancer, and men have an increased risk for prostate cancer, although precise lifetime risk estimates are not established. There is preliminary evidence supporting a correlation with CHEK2 and autosomal dominant predisposition to other cancer types including urinary tract cancer, ovarian cancer and melanoma (PMID: 63845364, 68032122, 48250037, 04888916, 94503888); however, the available evidence is insufficient to make a determination regarding these  relationships.  An individual's cancer risk and Wolfe management are not determined by genetic test results alone. Overall cancer risk assessment incorporates additional factors, including personal Wolfe history, family history, and any available genetic information that may result in a personalized plan for cancer prevention and surveillance.   Breast Cancer Management:  Women with a deleterious CHEK2 mutation have an increased lifetime risk for breast cancer. The following is recommended for individuals with a CHEK2 mutation, according to the NCCN Guidelines (Genetic/Familial High-Risk Assessment:  Breast, Ovarian, and Pancreatic, Version 1.2021):  Marland Kitchen Screening: Annual mammogram with consideration of tomosynthesis and consider breast MRI with contrast starting at age 62. Marland Kitchen Evidence is insufficient for risk-reducing mastectomy. Manage based on family history.  Colon Cancer Management:  Men and women with a deleterious CHEK2 mutation have an increased lifetime risk for colon cancer. According to the NCCN Guidelines (Genetic/Familial High-Risk Assessment: Colorectal, Version 1.2020), the following is recommended for individuals with a CHEK2 mutation:   Personal history of colon cancer:  Follow instructions provided by your physician based on your personal history.  Do not have a personal history of colon cancer but have a parent/sibling/child with colon cancer:  Colonoscopy every 5 years starting at age 57 or 46 years younger than the earliest age of onset, whichever is younger.  Do not have a personal history of colon cancer and do not have a parent/sibling/child with colon cancer:  Colonoscopy every 5 years starting at age 67.  Prostate Cancer Management:  Although pathogenic variants in CHEK2 increase the risk for prostate cancer in men, there are no additional screening recommendations beyond what is recommended for the general population, at this time. Men with CHEK2 variants may therefore  consider the following general population prostate cancer screening recommendations:   Prostate specific antigen (PSA) blood screening may be considered in men age 66-75 after discussion of risks and benefits.  Consider digital rectal exam.  FAMILY MEMBERS: It is important that all of Mr. Miguel Wolfe relatives (both men and women) know of the presence of this gene mutation.  Women need to know that they may be at increased risk for breast and colon cancers.  Men are at slightly increased risk for breast, prostate and colon cancers.  Genetic testing can sort out who in your family is at risk and who is not.  We would be happy to help meet with and coordinate genetic testing for any relative that is interested.  Mr. Miguel Wolfe children and siblings are at 50% risk to have inherited the mutation found in him. We recommend they have genetic testing for this same mutation, as identifying the presence of this mutation would allow them to also take advantage of risk-reducing measures.   Individuals with two copies of the CHEK2 1100delC variant (i.e., those who are homozygous) have a higher breast cancer risk compared with those with a single copy (i.e., those who are heterozygous). The lifetime risk of breast cancer in individuals homozygous  for this variant is estimated to be increased four to six fold. The estimates of other cancer risks among homozygotes or compound heterozygotes involving other variants is unclear.  Our knowledge of cancer risks related to CHEK2 mutations will continue to evolve. We recommended that Mr. Miguel Wolfe follow up with the genetics clinic annually so we can provide him with the most current information about CHEK2 and cancer risk, as well as with any changes to his family history (new cancer diagnoses, genetic test results).  Our contact number was provided. Mr. Miguel Wolfe questions were answered to his satisfaction, and he knows he is welcome to call us at anytime with additional  questions or concerns.   Clint Guy, MS, [facility] Certified Genetic Counselor [email or web address] Phone: [phone]   The patient was seen for a total of 30 minutes in face-to-face genetic counseling.

## 2019-01-19 ENCOUNTER — Ambulatory Visit: Payer: Medicare Other

## 2019-02-02 ENCOUNTER — Ambulatory Visit (HOSPITAL_COMMUNITY)
Admission: RE | Admit: 2019-02-02 | Discharge: 2019-02-02 | Disposition: A | Discharge status: Home / Self Care | Payer: Medicare Other | Source: Ambulatory Visit | Attending: Internal Medicine | Admitting: Internal Medicine

## 2019-02-02 ENCOUNTER — Other Ambulatory Visit: Payer: Self-pay

## 2019-02-02 ENCOUNTER — Inpatient Hospital Stay: Payer: Medicare Other

## 2019-02-02 DIAGNOSIS — E785 Hyperlipidemia, unspecified: Secondary | ICD-10-CM | POA: Diagnosis not present

## 2019-02-02 DIAGNOSIS — J939 Pneumothorax, unspecified: Secondary | ICD-10-CM | POA: Diagnosis not present

## 2019-02-02 DIAGNOSIS — K219 Gastro-esophageal reflux disease without esophagitis: Secondary | ICD-10-CM | POA: Diagnosis not present

## 2019-02-02 DIAGNOSIS — Z902 Acquired absence of lung [part of]: Secondary | ICD-10-CM | POA: Diagnosis not present

## 2019-02-02 DIAGNOSIS — Z7982 Long term (current) use of aspirin: Secondary | ICD-10-CM | POA: Diagnosis not present

## 2019-02-02 DIAGNOSIS — J449 Chronic obstructive pulmonary disease, unspecified: Secondary | ICD-10-CM | POA: Diagnosis not present

## 2019-02-02 DIAGNOSIS — C3412 Malignant neoplasm of upper lobe, left bronchus or lung: Secondary | ICD-10-CM | POA: Diagnosis not present

## 2019-02-02 DIAGNOSIS — M1990 Unspecified osteoarthritis, unspecified site: Secondary | ICD-10-CM | POA: Diagnosis not present

## 2019-02-02 DIAGNOSIS — C3490 Malignant neoplasm of unspecified part of unspecified bronchus or lung: Secondary | ICD-10-CM | POA: Diagnosis not present

## 2019-02-02 DIAGNOSIS — I10 Essential (primary) hypertension: Secondary | ICD-10-CM | POA: Diagnosis not present

## 2019-02-02 DIAGNOSIS — Z800 Family history of malignant neoplasm of digestive organs: Secondary | ICD-10-CM | POA: Diagnosis not present

## 2019-02-02 DIAGNOSIS — I2510 Atherosclerotic heart disease of native coronary artery without angina pectoris: Secondary | ICD-10-CM | POA: Diagnosis not present

## 2019-02-02 DIAGNOSIS — Z79899 Other long term (current) drug therapy: Secondary | ICD-10-CM | POA: Diagnosis not present

## 2019-02-02 DIAGNOSIS — Z791 Long term (current) use of non-steroidal anti-inflammatories (NSAID): Secondary | ICD-10-CM | POA: Diagnosis not present

## 2019-02-02 LAB — CMP (CANCER CENTER ONLY)
ALT: 16 U/L (ref 0–44)
AST: 20 U/L (ref 15–41)
Albumin: 4.7 g/dL (ref 3.5–5.0)
Alkaline Phosphatase: 79 U/L (ref 38–126)
Anion gap: 9 (ref 5–15)
BUN: 11 mg/dL (ref 8–23)
CO2: 30 mmol/L (ref 22–32)
Calcium: 8.6 mg/dL — ABNORMAL LOW (ref 8.9–10.3)
Chloride: 104 mmol/L (ref 98–111)
Creatinine: 0.8 mg/dL (ref 0.61–1.24)
GFR, Est AFR Am: >60 mL/min (ref >60)
GFR, Estimated: >60 mL/min (ref >60)
Glucose, Bld: 118 mg/dL — ABNORMAL HIGH (ref 70–99)
Potassium: 3.9 mmol/L (ref 3.5–5.1)
Sodium: 143 mmol/L (ref 135–145)
Total Bilirubin: 0.8 mg/dL (ref 0.3–1.2)
Total Protein: 7.3 g/dL (ref 6.5–8.1)

## 2019-02-02 LAB — CBC WITH DIFFERENTIAL (CANCER CENTER ONLY)
Abs Immature Granulocytes: 0 10*3/uL (ref 0.00–0.07)
Basophils Absolute: 0 10*3/uL (ref 0.0–0.1)
Basophils Relative: 0 %
Eosinophils Absolute: 0.2 10*3/uL (ref 0.0–0.5)
Eosinophils Relative: 4 %
HCT: 46.2 % (ref 39.0–52.0)
Hemoglobin: 15.5 g/dL (ref 13.0–17.0)
Immature Granulocytes: 0 %
Lymphocytes Relative: 32 %
Lymphs Abs: 1.7 10*3/uL (ref 0.7–4.0)
MCH: 29.7 pg (ref 26.0–34.0)
MCHC: 33.5 g/dL (ref 30.0–36.0)
MCV: 88.5 fL (ref 80.0–100.0)
Monocytes Absolute: 0.5 10*3/uL (ref 0.1–1.0)
Monocytes Relative: 10 %
Neutro Abs: 3 10*3/uL (ref 1.7–7.7)
Neutrophils Relative %: 54 %
Platelet Count: 172 10*3/uL (ref 150–400)
RBC: 5.22 MIL/uL (ref 4.22–5.81)
RDW: 12.3 % (ref 11.5–15.5)
WBC Count: 5.4 10*3/uL (ref 4.0–10.5)
nRBC: 0 % (ref 0.0–0.2)

## 2019-02-02 MED ORDER — IOHEXOL 300 MG/ML  SOLN
75.0000 mL | Freq: Once | INTRAMUSCULAR | Status: AC | PRN
  Administered 2019-02-02: 75 mL via INTRAVENOUS

## 2019-02-02 MED ORDER — SODIUM CHLORIDE (PF) 0.9 % IJ SOLN
INTRAMUSCULAR | Status: AC
  Filled 2019-02-02: qty 50

## 2019-02-04 ENCOUNTER — Encounter: Payer: Self-pay | Admitting: Internal Medicine

## 2019-02-04 ENCOUNTER — Telehealth: Payer: Self-pay | Admitting: Cardiology

## 2019-02-04 ENCOUNTER — Other Ambulatory Visit: Payer: Self-pay

## 2019-02-04 ENCOUNTER — Inpatient Hospital Stay (HOSPITAL_BASED_OUTPATIENT_CLINIC_OR_DEPARTMENT_OTHER): Payer: Medicare Other | Admitting: Internal Medicine

## 2019-02-04 VITALS — BP 128/71 | HR 79 | Temp 97.5°F | Resp 18 | Ht 71.0 in | Wt 169.8 lb

## 2019-02-04 DIAGNOSIS — E785 Hyperlipidemia, unspecified: Secondary | ICD-10-CM | POA: Diagnosis not present

## 2019-02-04 DIAGNOSIS — C3412 Malignant neoplasm of upper lobe, left bronchus or lung: Secondary | ICD-10-CM | POA: Diagnosis not present

## 2019-02-04 DIAGNOSIS — I10 Essential (primary) hypertension: Secondary | ICD-10-CM | POA: Diagnosis not present

## 2019-02-04 DIAGNOSIS — C3410 Malignant neoplasm of upper lobe, unspecified bronchus or lung: Secondary | ICD-10-CM

## 2019-02-04 DIAGNOSIS — I2510 Atherosclerotic heart disease of native coronary artery without angina pectoris: Secondary | ICD-10-CM | POA: Diagnosis not present

## 2019-02-04 DIAGNOSIS — Z902 Acquired absence of lung [part of]: Secondary | ICD-10-CM | POA: Diagnosis not present

## 2019-02-04 DIAGNOSIS — J449 Chronic obstructive pulmonary disease, unspecified: Secondary | ICD-10-CM | POA: Diagnosis not present

## 2019-02-04 DIAGNOSIS — C3490 Malignant neoplasm of unspecified part of unspecified bronchus or lung: Secondary | ICD-10-CM | POA: Diagnosis not present

## 2019-02-04 NOTE — Telephone Encounter (Signed)
Patient calling stating he had a CT scan and there were some cardio issues. He would like to know if he needs to move up his echo, or if there are other tests he needs and if he needs to be seen by Dr. Marlou Porch soon.

## 2019-02-04 NOTE — Progress Notes (Signed)
[facility] [phone]   [phone]  OFFICE PROGRESS NOTE  [name], MD [place] [address]  DIAGNOSIS: Stage IA (T1c, N0, M0) non-small cell lung cancer, adenocarcinoma with no actionable mutation and negative PDL 1 expression diagnosed in June 2020.   PRIOR THERAPY: Status post left upper lobectomy with lymph node dissection on June 24, 2018.  CURRENT THERAPY: Observation.  INTERVAL HISTORY: Miguel Wolfe 74 y.o. male returns to the clinic today for follow-up visit.  His wife was available by phone during the visit.  The patient is feeling fine today with no concerning complaints except for soreness in the throat mainly on the left side.  He denied having any current chest pain but has shortness of breath with exertion with no cough or hemoptysis.  He denied having any fever or chills.  He has no nausea, vomiting, diarrhea or constipation.  He denied having any headache or visual changes.  He has no recent weight loss or night sweats.  The patient had repeat CT scan of the chest performed recently and he is here today for evaluation and discussion of his discuss results.  MEDICAL HISTORY: Past Medical History:  Diagnosis Date  . Anginal pain ([place])   . Arthritis   . Colon polyps   . Coronary artery disease involving native coronary artery of native heart without angina pectoris 04/23/2016   Myoview 02/08/16: EF 51, inf-lat, apical lateral scar with peri-infarct ischemia; intermediate risk  // LHC 2/18: dLAD 20, oD1 20, pLCx 20, pRCA 80, mRCA 20, dRCA 30, AM 90 >> PCI: 4 x 24 mm Synergy DES to pRCA   . Family history of adverse reaction to anesthesia    Mom vomits and Dad went "nuts"  . Family history of colon cancer   . Family history of stomach cancer   . Fatigue   . GERD (gastroesophageal reflux disease)   . Hemorrhoids   . History of echocardiogram    Echo 02/08/16: Mild LVH, EF 55-60, no RWMA, Gr 1 DD, trivial  AI, mild LAE  . Hyperlipidemia   . Hypertension   . PONV (postoperative nausea and vomiting)   . Pre-diabetes   . Pulmonary nodules     ALLERGIES:  is allergic to gemfibrozil; niaspan [niacin er]; and simvastatin.  MEDICATIONS:  Current Outpatient Medications  Medication Sig Dispense Refill  . acetaminophen (TYLENOL) 325 MG tablet Take 650 mg by mouth every 6 (six) hours as needed for moderate pain or headache.     Marland Kitchen amLODipine (NORVASC) 10 MG tablet Take 10 mg by mouth daily.    Marland Kitchen aspirin EC 81 MG tablet Take 81 mg by mouth daily.    Marland Kitchen ibuprofen (ADVIL,MOTRIN) 200 MG tablet Take 400 mg by mouth every 8 (eight) hours as needed for headache or moderate pain.     Marland Kitchen levothyroxine (SYNTHROID) 137 MCG tablet Take 137 mcg by mouth daily before breakfast.     . losartan (COZAAR) 100 MG tablet Take 100 mg by mouth daily.    . nitroGLYCERIN (NITROSTAT) 0.4 MG SL tablet Place 1 tablet (0.4 mg total) under the tongue every 5 (five) minutes as needed for chest pain. 25 tablet 12  . omega-3 acid ethyl esters (LOVAZA) 1 g capsule Take 2 g by mouth 2 (two) times daily.    . pantoprazole (PROTONIX) 40 MG tablet TAKE 1 TABLET(40 MG) BY MOUTH DAILY 90 tablet 3  . propranolol (INDERAL) 20 MG  tablet Take 20 mg by mouth daily.  2  . rosuvastatin (CRESTOR) 20 MG tablet TAKE 1 TABLET(20 MG) BY MOUTH DAILY 90 tablet 3  . traZODone (DESYREL) 100 MG tablet Take 100 mg by mouth at bedtime as needed for sleep.    Marland Kitchen zolpidem (AMBIEN) 10 MG tablet Take 10 mg by mouth at bedtime as needed for sleep.   0   No current facility-administered medications for this visit.    SURGICAL HISTORY:  Past Surgical History:  Procedure Laterality Date  . CARDIAC CATHETERIZATION    . COLONOSCOPY WITH ESOPHAGOGASTRODUODENOSCOPY (EGD)  2008  . CORONARY STENT INTERVENTION N/A 02/20/2016   Procedure: Coronary Stent Intervention;  Surgeon: Burnell Blanks, MD;  Location: [facility];  Service: Cardiovascular;   Laterality: N/A;  . ESOPHAGOGASTRODUODENOSCOPY    . LEFT HEART CATH AND CORONARY ANGIOGRAPHY N/A 02/20/2016   Procedure: Left Heart Cath and Coronary Angiography;  Surgeon: Burnell Blanks, MD;  Location: [facility];  Service: Cardiovascular;  Laterality: N/A;  . PILONIDAL CYCT    . REFRACTIVE SURGERY     retina tear repair  . THYROIDECTOMY N/A 01/17/2018   Procedure: TOTAL THYROIDECTOMY;  Surgeon: Armandina Gemma, MD;  Location: [facility];  Service: General;  Laterality: N/A;  . VIDEO ASSISTED THORACOSCOPY (VATS)/WEDGE RESECTION Left 06/24/2018   Procedure: VIDEO ASSISTED THORACOSCOPY (VATS)/LUNG RESECTION;  Surgeon: Grace Isaac, MD;  Location: [facility];  Service: Thoracic;  Laterality: Left;  Marland Kitchen VIDEO BRONCHOSCOPY WITH ENDOBRONCHIAL NAVIGATION N/A 11/18/2017   Procedure: VIDEO BRONCHOSCOPY WITH ENDOBRONCHIAL NAVIGATION WITH BIOPSIES OF LEFT AND RIGHT UPPER LOBES;  Surgeon: Grace Isaac, MD;  Location: [facility];  Service: Thoracic;  Laterality: N/A;  . VIDEO BRONCHOSCOPY WITH ENDOBRONCHIAL NAVIGATION N/A 06/11/2018   Procedure: VIDEO BRONCHOSCOPY WITH ENDOBRONCHIAL NAVIGATION;  Surgeon: Grace Isaac, MD;  Location: [facility];  Service: Thoracic;  Laterality: N/A;    REVIEW OF SYSTEMS:  Constitutional: negative Eyes: negative Ears, nose, mouth, throat, and face: negative Respiratory: positive for dyspnea on exertion Cardiovascular: negative Gastrointestinal: negative Genitourinary:negative Integument/breast: negative Hematologic/lymphatic: negative Musculoskeletal:negative Neurological: negative Behavioral/Psych: negative Endocrine: negative Allergic/Immunologic: negative   PHYSICAL EXAMINATION: General appearance: alert, cooperative and no distress Head: Normocephalic, without obvious abnormality, atraumatic Neck: no adenopathy, no JVD, supple, symmetrical, trachea midline and thyroid not enlarged, symmetric, no tenderness/mass/nodules Lymph nodes: Cervical,  supraclavicular, and axillary nodes normal. Resp: clear to auscultation bilaterally Back: symmetric, no curvature. ROM normal. No CVA tenderness. Cardio: regular rate and rhythm, S1, S2 normal, no murmur, click, rub or gallop GI: soft, non-tender; bowel sounds normal; no masses,  no organomegaly Extremities: extremities normal, atraumatic, no cyanosis or edema Neurologic: Alert and oriented X 3, normal strength and tone. Normal symmetric reflexes. Normal coordination and gait  ECOG PERFORMANCE STATUS: 1 - Symptomatic but completely ambulatory  Blood pressure 128/71, pulse 79, temperature (!) 97.5 F (36.4 C), temperature source Oral, resp. rate 18, height 5\' 11"  (1.803 m), weight 169 lb 12.8 oz (77 kg), SpO2 99 %.  LABORATORY DATA: Lab Results  Component Value Date   WBC 5.4 02/02/2019   HGB 15.5 02/02/2019   HCT 46.2 02/02/2019   MCV 88.5 02/02/2019   PLT 172 02/02/2019      Chemistry      Component Value Date/Time   NA 143 02/02/2019 0903   NA 143 04/24/2016 0935   K 3.9 02/02/2019 0903   CL 104 02/02/2019 0903   CO2 30 02/02/2019 0903   BUN 11 02/02/2019  0903   BUN 17 04/24/2016 0935   CREATININE 0.80 02/02/2019 0903      Component Value Date/Time   CALCIUM 8.6 (L) 02/02/2019 0903   ALKPHOS 79 02/02/2019 0903   AST 20 02/02/2019 0903   ALT 16 02/02/2019 0903   BILITOT 0.8 02/02/2019 0903       RADIOGRAPHIC STUDIES: CT Chest W Contrast  Result Date: 02/02/2019 CLINICAL DATA:  74 year old male with history of non-small cell lung cancer. Staging examination. EXAM: CT CHEST WITH CONTRAST TECHNIQUE: Multidetector CT imaging of the chest was performed during intravenous contrast administration. CONTRAST:  24mL OMNIPAQUE IOHEXOL 300 MG/ML  SOLN COMPARISON:  Chest CT 05/28/2018. FINDINGS: Cardiovascular: Heart size is normal. There is no significant pericardial fluid, thickening or pericardial calcification. There is aortic atherosclerosis, as well as atherosclerosis of the  great vessels of the mediastinum and the coronary arteries, including calcified atherosclerotic plaque in the left main, left anterior descending, left circumflex and right coronary arteries. Mediastinum/Nodes: No pathologically enlarged mediastinal or hilar lymph nodes. Please note that accurate exclusion of hilar adenopathy is limited on noncontrast CT scans. Esophagus is unremarkable in appearance. In the right paraspinal region there is a small lenticular shaped 2.4 x 1.4 cm soft tissue attenuation lesion which is partially calcified and demonstrates some associated remodeling of the lateral aspect of the right L1 vertebral body, stable compared to numerous prior examinations, most compatible with a benign lesions such as a schwannoma or neurofibroma. No axillary lymphadenopathy. Lungs/Pleura: Compared to the prior study there has been interval left upper lobectomy with compensatory hyperexpansion of the left lower lobe. Notably, there is a small left-sided pneumothorax. Ground-glass attenuation nodule with linear internal solid component overall measuring 2.6 x 1.5 cm with linear solid component measuring up to 1.5 cm in length (axial image 37 of series 7), similar to prior studies dating back to 10/23/2017. No other new suspicious appearing pulmonary nodules or masses are noted. No acute consolidative airspace disease. No pleural effusions. Upper Abdomen: Aortic atherosclerosis. Musculoskeletal: There are no aggressive appearing lytic or blastic lesions noted in the visualized portions of the skeleton. IMPRESSION: 1. Status post left upper lobectomy. Small left-sided pneumothorax is new compared to the prior study. No mediastinal or hilar lymphadenopathy. 2. Previously noted right upper lobe mixed solid and sub solid nodule appears very similar to prior examinations dating back to 2019, as above. Continued attention on follow-up studies is recommended. 3. Aortic atherosclerosis, in addition to left main and 3  vessel coronary artery disease. Assessment for potential risk factor modification, dietary therapy or pharmacologic therapy may be warranted, if clinically indicated. Critical Value/emergent results were called by telephone at the time of interpretation on 02/02/2019 at 2:15 pm to provider [facility] , who verbally acknowledged these results. Aortic Atherosclerosis (ICD10-I70.0). Electronically Signed   By: Vinnie Langton M.D.   On: 02/02/2019 14:15    ASSESSMENT AND PLAN: This is a very pleasant 74 years old white male with stage Ia non-small cell lung cancer, adenocarcinoma status post left upper lobectomy with lymph node dissection under the care of Dr. Servando Snare in June 2020. The patient has been in observation for the last 7 months and he is feeling fine with no concerning complaints except for shortness of breath with exertion and mild left-sided sore throat. The patient had repeat CT scan of the chest performed recently.  I personally and independently reviewed the scan images and discussed the result and showed the images to the patient today. His scan showed  no concerning findings for disease recurrence or progression but there was developed of new small left-sided pneumothorax compared to the previous study before his surgery.  This could be residual pneumothorax after his surgical resection or secondary to cough and rupture of emphysematous bulla. I will discuss his scan with Dr. Servando Snare for further recommendation regarding management of the small pneumothorax. For the coronary artery disease, the patient is followed by Dr. Gillian Shields from cardiology and he is scheduled for 2D echo next months. For the history of COPD, I will refer the patient to pulmonary medicine for evaluation and management of his condition. I will see the patient back for follow-up visit in 6 months with repeat CT scan of the chest for restaging of his disease. He was advised to call immediately if he has any concerning  symptoms in the interval. The patient voices understanding of current disease status and treatment options and is in agreement with the current care plan.  All questions were answered. The patient knows to call the clinic with any problems, questions or concerns. We can certainly see the patient much sooner if necessary.   Disclaimer: This note was dictated with voice recognition software. Similar sounding words can inadvertently be transcribed and may not be corrected upon review.

## 2019-02-04 NOTE — Telephone Encounter (Signed)
I spoke to the patient and informed him that we would reach out to him as soon as Dr Marlou Porch reads the CT scan and gives his recommendations.  He verbalized understanding.

## 2019-02-09 ENCOUNTER — Ambulatory Visit: Payer: Medicare Other

## 2019-02-10 ENCOUNTER — Encounter: Payer: Self-pay | Admitting: Internal Medicine

## 2019-02-11 ENCOUNTER — Telehealth: Payer: Self-pay

## 2019-02-11 NOTE — Telephone Encounter (Signed)
Patient called the office this afternoon with c/o "needle-prickling pain" to L upper chest that started about 3 days ago. He states he spoke w/ Dr. Servando Snare on the phone last week regarding his recent chest CT and that he forgot to tell him that he's been having some mild pain at his incision site which began a few weeks ago. Pt says he thinks this may be due to the way he was positioned for the CT. Pt is s/p VATS, left lung resection on 06/24/18. He denies any s/s of infection and any sharp pain and/or shortness of breath. Informed him that I would discuss his symptoms w/ Dr. Servando Snare and get back with him by no later than tomorrow afternoon. Instructed him to seek urgent medical attention for sharp chest pain and/or shortness of breath.

## 2019-02-12 ENCOUNTER — Other Ambulatory Visit: Payer: Self-pay

## 2019-02-12 ENCOUNTER — Telehealth: Payer: Self-pay

## 2019-02-12 DIAGNOSIS — Z902 Acquired absence of lung [part of]: Secondary | ICD-10-CM

## 2019-02-12 NOTE — Telephone Encounter (Signed)
TC to inform pt that Dr. Servando Snare was made aware of the information we discussed on the phone yesterday. Appt was made for 2/11 w/ CXR per Dr. Servando Snare. Pt reminded to seek urgent medical attention for sharp chest pain and/or shortness of breath and to call office w/ other problems or concerns.

## 2019-02-15 ENCOUNTER — Ambulatory Visit: Payer: Medicare Other

## 2019-02-16 DIAGNOSIS — E890 Postprocedural hypothyroidism: Secondary | ICD-10-CM | POA: Diagnosis not present

## 2019-02-16 DIAGNOSIS — Z85850 Personal history of malignant neoplasm of thyroid: Secondary | ICD-10-CM | POA: Diagnosis not present

## 2019-02-18 NOTE — Progress Notes (Signed)
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 SURGICAL PROCEDURE:  Left video-assisted thoracoscopy, left upper lobectomy with lymph node dissection, and intercostal nerve block.  Cancer Staging Lung cancer, left upper lobe [facility]) Staging form: Lung, AJCC 8th Edition - Pathologic stage from 06/23/2018: Stage IA3 (pT1c, pN0, cM0) - Signed by Grace Isaac, MD on 06/25/2018 - Clinical: No stage assigned - Unsigned   History of Present Illness:     Patient comes in after follow of ct chest 8 months postop - follow up ct showed small left apical air space. Patient has been doing well. No hemoptysis, mild dyspnea with exertion, noe with usually activities.   Zubrod Score: At the time of surgery this patient's most appropriate activity status/level should be described as: _0     0    Normal activity, no symptoms _1     1    Restricted in physical strenuous activity but ambulatory, able to do out light work _2     2    Ambulatory and capable of self care, unable to do work activities, up and about                 >50 % of waking hours                                                                                   _3     3    Only limited self care, in bed greater than 50% of waking hours _4     4    Completely disabled, no self care, confined to bed or chair _5     5    Moribund  Social History   Tobacco Use  Smoking Status Former Smoker  . Types: Cigarettes  . Quit date: 02/01/1967  . Years since quitting: 52.0  Smokeless Tobacco Never Used         Allergies  Allergen Reactions  . Gemfibrozil Other (See Comments)    Muscle pain  . Propranolol     Other reaction(s): SOB  . Niaspan [Niacin Er] Rash and Other (See Comments)    Flushing - aspirin did not mitigate   . Simvastatin Other (See Comments)    Drug-Drug interaction with amlodipine    Current Outpatient Medications  Medication Sig Dispense Refill  . acetaminophen (TYLENOL) 325 MG tablet Take 650 mg by mouth every 6 (six) hours as needed for moderate pain or headache.     Marland Kitchen amLODipine (NORVASC) 10 MG tablet Take 10 mg by mouth daily.    Marland Kitchen aspirin EC 81 MG tablet Take 81 mg by mouth daily.    Marland Kitchen ibuprofen (  ADVIL,MOTRIN) 200 MG tablet Take 400 mg by mouth every 8 (eight) hours as needed for headache or moderate pain.     Marland Kitchen levothyroxine (SYNTHROID) 137 MCG tablet Take 137 mcg by mouth daily before breakfast.     . losartan (COZAAR) 100 MG tablet Take 100 mg by mouth daily.    . nitroGLYCERIN (NITROSTAT) 0.4 MG SL tablet Place 1 tablet (0.4 mg total) under the tongue every 5 (five) minutes as needed for chest pain. 25 tablet 12  . omega-3 acid ethyl esters (LOVAZA) 1 g capsule Take 2 g by mouth 2 (two) times daily.    . pantoprazole (PROTONIX) 40 MG tablet TAKE 1 TABLET(40 MG) BY MOUTH DAILY 90 tablet 3  . propranolol (INDERAL) 20 MG tablet Take 20 mg by mouth daily.  2  . rosuvastatin (CRESTOR) 20 MG tablet TAKE 1 TABLET(20 MG) BY MOUTH DAILY 90 tablet 3  . traZODone (DESYREL) 100 MG tablet Take 100 mg by mouth at bedtime as needed for sleep.    Marland Kitchen zolpidem (AMBIEN) 10 MG tablet Take 10 mg by mouth at bedtime as needed for sleep.   0   No current facility-administered medications for this visit.       Physical Exam: BP 134/80 (BP Location: Left Arm, Patient Position: Sitting, Cuff Size: Normal)   Pulse 72   Temp 97.9 F (36.6 C) (Temporal)   Resp 20   Ht _0  (1.803 m)   Wt 170 lb (77.1 kg)   SpO2 97% Comment: RA  BMI 23.71 kg/m   General appearance: alert  and no distress Head: Normocephalic, without obvious abnormality, atraumatic Neck: no adenopathy, no carotid bruit, no JVD, supple, symmetrical, trachea midline and thyroid not enlarged, symmetric, no tenderness/mass/nodules Lymph nodes: Cervical, supraclavicular, and axillary nodes normal. Resp: clear to auscultation bilaterally Cardio: regular rate and rhythm, S1, S2 normal, no murmur, click, rub or gallop GI: soft, non-tender; bowel sounds normal; no masses,  no organomegaly Extremities: extremities normal, atraumatic, no cyanosis or edema Neurologic: Grossly normal Left chest port sites chest tube sites are healing well  Diagnostic Studies & Laboratory data:         Recent Radiology Findings: DG Chest 2 View  Result Date: 02/19/2019 CLINICAL DATA:  Status post VATS with left lung resection 06/24/2018. Left pneumothorax. EXAM: CHEST - 2 VIEW COMPARISON:  CT 02/02/2019, radiograph 09/11/2018 FINDINGS: Small left anterior apical pneumothorax is not significantly changed in size from prior imaging. The previous fluid component and radiograph has resolved. Minimal postsurgical volume loss in the left hemithorax. Unchanged heart size and mediastinal contours with aortic atherosclerosis. Right upper lobe ground-glass opacity on CT is not well demonstrated radiographically. No acute airspace disease, pulmonary edema, or pleural fluid. No acute osseous abnormalities are seen. IMPRESSION: 1. Small left apical anterior pneumothorax is unchanged in size from prior imaging. 2. No acute abnormalities. Electronically Signed   By: Keith Rake M.D.   On: 02/19/2019 12:48   CT Chest W Contrast  Result Date: 02/02/2019 CLINICAL DATA:  74 year old male with history of non-small cell lung cancer. Staging examination. EXAM: CT CHEST WITH CONTRAST TECHNIQUE: Multidetector CT imaging of the chest was performed during intravenous contrast administration. CONTRAST:  70m OMNIPAQUE IOHEXOL 300 MG/ML  SOLN  COMPARISON:  Chest CT 05/28/2018. FINDINGS: Cardiovascular: Heart size is normal. There is no significant pericardial fluid, thickening or pericardial calcification. There is aortic atherosclerosis, as well as atherosclerosis of the great vessels of the mediastinum and the coronary arteries, including calcified  atherosclerotic plaque in the left main, left anterior descending, left circumflex and right coronary arteries. Mediastinum/Nodes: No pathologically enlarged mediastinal or hilar lymph nodes. Please note that accurate exclusion of hilar adenopathy is limited on noncontrast CT scans. Esophagus is unremarkable in appearance. In the right paraspinal region there is a small lenticular shaped 2.4 x 1.4 cm soft tissue attenuation lesion which is partially calcified and demonstrates some associated remodeling of the lateral aspect of the right L1 vertebral body, stable compared to numerous prior examinations, most compatible with a benign lesions such as a schwannoma or neurofibroma. No axillary lymphadenopathy. Lungs/Pleura: Compared to the prior study there has been interval left upper lobectomy with compensatory hyperexpansion of the left lower lobe. Notably, there is a small left-sided pneumothorax. Ground-glass attenuation nodule with linear internal solid component overall measuring 2.6 x 1.5 cm with linear solid component measuring up to 1.5 cm in length (axial image 37 of series 7), similar to prior studies dating back to 10/23/2017. No other new suspicious appearing pulmonary nodules or masses are noted. No acute consolidative airspace disease. No pleural effusions. Upper Abdomen: Aortic atherosclerosis. Musculoskeletal: There are no aggressive appearing lytic or blastic lesions noted in the visualized portions of the skeleton. IMPRESSION: 1. Status post left upper lobectomy. Small left-sided pneumothorax is new compared to the prior study. No mediastinal or hilar lymphadenopathy. 2. Previously noted right  upper lobe mixed solid and sub solid nodule appears very similar to prior examinations dating back to 2019, as above. Continued attention on follow-up studies is recommended. 3. Aortic atherosclerosis, in addition to left main and 3 vessel coronary artery disease. Assessment for potential risk factor modification, dietary therapy or pharmacologic therapy may be warranted, if clinically indicated. Critical Value/emergent results were called by telephone at the time of interpretation on 02/02/2019 at 2:15 pm to provider [facility] , who verbally acknowledged these results. Aortic Atherosclerosis (ICD10-I70.0). Electronically Signed   By: Vinnie Langton M.D.   On: 02/02/2019 14:15     I have independently reviewed the above radiology findings and reviewed findings  with the patient.  Recent Labs: Lab Results  Component Value Date   WBC 5.4 02/02/2019   HGB 15.5 02/02/2019   HCT 46.2 02/02/2019   PLT 172 02/02/2019   GLUCOSE 118 (H) 02/02/2019   CHOL 151 01/21/2017   TRIG 177 (H) 01/21/2017   HDL 48 01/21/2017   LDLCALC 68 01/21/2017   ALT 16 02/02/2019   AST 20 02/02/2019   NA 143 02/02/2019   K 3.9 02/02/2019   CL 104 02/02/2019   CREATININE 0.80 02/02/2019   BUN 11 02/02/2019   CO2 30 02/02/2019   TSH 10.736 (H) 01/24/2018   INR 1.1 06/20/2018   HGBA1C 5.9 (H) 06/09/2018    PATH: Diagnosis 1. Lung, resection (segmental or lobe), left upper lobe - ADENOCARCINOMA, 2.3 CM. - THREE BENIGN LYMPH NODES (0/3). - MARGINS NOT INVOLVED. 2. Lymph node, biopsy, 10 L node - ANTHRACOTIC LYMPH NODE. - NO METASTATIC CARCINOMA IDENTIFIED. 3. Lymph node, biopsy, 10 L #2 - ANTHRACOTIC LYMPH NODE. - NO METASTATIC CARCINOMA IDENTIFIED. 4. Lymph node, biopsy, 11 L node - ANTHRACOTIC LYMPH NODE. - NO METASTATIC CARCINOMA IDENTIFIED. 5. Lymph node, biopsy, 4 L node - ANTHRACOTIC LYMPH NODE. - NO METASTATIC CARCINOMA IDENTIFIED. 6. Lymph node, biopsy, 4 L #2 node - ANTHRACOTIC LYMPH  NODE. - NO METASTATIC CARCINOMA IDENTIFIED. 7. Lymph node, biopsy, 11 L #2 node - ANTHRACOTIC LYMPH NODE. - NO METASTATIC CARCINOMA IDENTIFIED. 8. Lymph  node, biopsy, 12 L node - ANTHRACOTIC LYMPH NODE. - NO METASTATIC CARCINOMA IDENTIFIED. 9. Lymph node, biopsy, 12 L #2 node - ANTHRACOTIC LYMPH NODE. - NO METASTATIC CARCINOMA IDENTIFIED. 10. Lymph node, biopsy, Level 8 Paraesophageal - ANTHRACOTIC LYMPH NODE. 1 of 3 FINAL for Mclin, Hannah L ([id number]) Diagnosis(continued) - NO METASTATIC CARCINOMA IDENTIFIED. 11. Lymph node, biopsy, 4 L #3 node - ANTHRACOTIC LYMPH NODE. - NO METASTATIC CARCINOMA IDENTIFIED. Microscopic Comment 1. LUNG: Procedure: Left upper lobectomy and ten additional lymph node biopsies. Specimen Laterality: Left. Tumor Site: Left upper lobe. Tumor Size: 2.3 x 2.0 x 1.7 cm. Total Tumor Size Inclusive of Invasive and Lepidic Components (applies only to invasive nonmucinous adenocarcinoma with a lepidic component): 2.3 cm. Invasive Tumor Size (applies only to invasive nonmucinous adenocarcinoma with a lepidic component: 2.3 cm. Tumor Focality: Unifocal. Histologic Type: Adenocarcinoma. Visceral Pleura Invasion: No. Lymphovascular Invasion: No. Direct Invasion of Adjacent Structures: N/A. Margins: Free of tumor. Treatment Effect: No. Regional Lymph Nodes: Number of Lymph Nodes Involved: 0. Number of Lymph Nodes Examined: 13. Pathologic Stage Classification (pTNM, AJCC 8th Edition): pTic, pN0. Ancillary Studies: Foundation 1 and PDL-1 pending. Representative Tumor Block: 1E and 67F. (JDP:ah 06/25/18) ([id number]) Claudette Laws MD  Assessment / Plan:   Stable postop, small left apical airspace no specific treatment needed  Continue follow up ct monitoring especially for right upper lobe ground glass area Plan to see back in July after follow up ct chest       Medication Changes: No orders of the defined types were placed in this  encounter.    Grace Isaac 02/22/2019 5:14 PM

## 2019-02-19 ENCOUNTER — Ambulatory Visit (INDEPENDENT_AMBULATORY_CARE_PROVIDER_SITE_OTHER): Payer: Medicare Other | Admitting: Cardiothoracic Surgery

## 2019-02-19 ENCOUNTER — Other Ambulatory Visit: Payer: Self-pay

## 2019-02-19 ENCOUNTER — Ambulatory Visit
Admission: RE | Admit: 2019-02-19 | Discharge: 2019-02-19 | Disposition: A | Discharge status: Home / Self Care | Payer: Medicare Other | Source: Ambulatory Visit | Attending: Cardiothoracic Surgery | Admitting: Cardiothoracic Surgery

## 2019-02-19 VITALS — BP 134/80 | HR 72 | Temp 97.9°F | Resp 20 | Ht 71.0 in | Wt 170.0 lb

## 2019-02-19 DIAGNOSIS — E230 Hypopituitarism: Secondary | ICD-10-CM | POA: Diagnosis not present

## 2019-02-19 DIAGNOSIS — Z902 Acquired absence of lung [part of]: Secondary | ICD-10-CM | POA: Diagnosis not present

## 2019-02-19 DIAGNOSIS — E890 Postprocedural hypothyroidism: Secondary | ICD-10-CM | POA: Diagnosis not present

## 2019-02-19 DIAGNOSIS — Z7189 Other specified counseling: Secondary | ICD-10-CM | POA: Diagnosis not present

## 2019-02-19 DIAGNOSIS — J9311 Primary spontaneous pneumothorax: Secondary | ICD-10-CM | POA: Diagnosis not present

## 2019-02-19 DIAGNOSIS — Z85118 Personal history of other malignant neoplasm of bronchus and lung: Secondary | ICD-10-CM | POA: Diagnosis not present

## 2019-02-19 DIAGNOSIS — Z808 Family history of malignant neoplasm of other organs or systems: Secondary | ICD-10-CM | POA: Diagnosis not present

## 2019-02-19 DIAGNOSIS — J939 Pneumothorax, unspecified: Secondary | ICD-10-CM | POA: Diagnosis not present

## 2019-02-19 DIAGNOSIS — Z85828 Personal history of other malignant neoplasm of skin: Secondary | ICD-10-CM | POA: Diagnosis not present

## 2019-02-19 DIAGNOSIS — Z85850 Personal history of malignant neoplasm of thyroid: Secondary | ICD-10-CM | POA: Diagnosis not present

## 2019-03-02 ENCOUNTER — Other Ambulatory Visit: Payer: Self-pay

## 2019-03-02 ENCOUNTER — Ambulatory Visit (HOSPITAL_COMMUNITY): Payer: Medicare Other | Attending: Cardiovascular Disease

## 2019-03-02 DIAGNOSIS — I10 Essential (primary) hypertension: Secondary | ICD-10-CM | POA: Diagnosis not present

## 2019-03-02 DIAGNOSIS — R0602 Shortness of breath: Secondary | ICD-10-CM

## 2019-03-02 DIAGNOSIS — I351 Nonrheumatic aortic (valve) insufficiency: Secondary | ICD-10-CM | POA: Diagnosis not present

## 2019-03-03 ENCOUNTER — Ambulatory Visit: Payer: Medicare Other

## 2019-03-03 ENCOUNTER — Ambulatory Visit (INDEPENDENT_AMBULATORY_CARE_PROVIDER_SITE_OTHER): Payer: Medicare Other | Admitting: Pulmonary Disease

## 2019-03-03 ENCOUNTER — Encounter: Payer: Self-pay | Admitting: Pulmonary Disease

## 2019-03-03 VITALS — BP 122/68 | HR 74 | Temp 97.9°F | Ht 71.0 in | Wt 171.0 lb

## 2019-03-03 DIAGNOSIS — C3410 Malignant neoplasm of upper lobe, unspecified bronchus or lung: Secondary | ICD-10-CM

## 2019-03-03 DIAGNOSIS — R0602 Shortness of breath: Secondary | ICD-10-CM

## 2019-03-03 NOTE — Progress Notes (Signed)
Subjective:    Patient ID: Miguel Wolfe, male    DOB: 07-04-1945, 74 y.o.   MRN: [id number]  Patient with a history of adenocarcinoma of the lung for which he had recent lung resection  Shortness of breath with moderate exertion Occasional cough-nonproductive  He is still able to function well, does not consider himself limited We will have the shortness of breath with moderate to significant exertion Able to walk over 30 minutes, plays golf on a regular basis  Symptoms started following his surgery  Recently had a CT follow-up which showed, postsurgical changes, stable right lung nodule, incompletely reexpanded left lung  He has no wheezing, no symptoms suggesting an acute infectious process No recent trauma or intervention to create a concern for a new air leak  He has had no worsening of symptoms and feels he is getting better  Only smoked about 3 years, less than a pack a day, quit in 69  Initial CT was obtained in the evaluation of chest pain/discomfort He has a follow-up CT scheduled for about August  Past Medical History:  Diagnosis Date  . Anginal pain ([place])   . Arthritis   . Colon polyps   . Coronary artery disease involving native coronary artery of native heart without angina pectoris 04/23/2016   Myoview 02/08/16: EF 51, inf-lat, apical lateral scar with peri-infarct ischemia; intermediate risk  // LHC 2/18: dLAD 20, oD1 20, pLCx 20, pRCA 80, mRCA 20, dRCA 30, AM 90 >> PCI: 4 x 24 mm Synergy DES to pRCA   . Family history of adverse reaction to anesthesia    Mom vomits and Dad went "nuts"  . Family history of colon cancer   . Family history of stomach cancer   . Fatigue   . GERD (gastroesophageal reflux disease)   . Hemorrhoids   . History of echocardiogram    Echo 02/08/16: Mild LVH, EF 55-60, no RWMA, Gr 1 DD, trivial AI, mild LAE  . Hyperlipidemia   . Hypertension   . PONV (postoperative nausea and vomiting)   . Pre-diabetes   . Pulmonary nodules     Social History   Socioeconomic History  . Marital status: Married    Spouse name: Not on file  . Number of children: Not on file  . Years of education: Not on file  . Highest education level: Not on file  Occupational History  . Not on file  Tobacco Use  . Smoking status: Former Smoker    Packs/day: 1.00    Years: 3.00    Pack years: 3.00    Types: Cigarettes    Quit date: 02/01/1967    Years since quitting: 52.1  . Smokeless tobacco: Never Used  Substance and Sexual Activity  . Alcohol use: Yes    Comment: a drink before dinner a few times a week  . Drug use: No  . Sexual activity: Not on file  Other Topics Concern  . Not on file  Social History Narrative  . Not on file   Social Determinants of Health   Financial Resource Strain:   . Difficulty of Paying Living Expenses: Not on file  Food Insecurity:   . Worried About Charity fundraiser in the Last Year: Not on file  . Ran Out of Food in the Last Year: Not on file  Transportation Needs:   . Lack of Transportation (Medical): Not on file  . Lack of Transportation (Non-Medical): Not on file  Physical Activity:   .  Days of Exercise per Week: Not on file  . Minutes of Exercise per Session: Not on file  Stress:   . Feeling of Stress : Not on file  Social Connections:   . Frequency of Communication with Friends and Family: Not on file  . Frequency of Social Gatherings with Friends and Family: Not on file  . Attends Religious Services: Not on file  . Active Member of Clubs or Organizations: Not on file  . Attends Archivist Meetings: Not on file  . Marital Status: Not on file  Intimate Partner Violence:   . Fear of Current or Ex-Partner: Not on file  . Emotionally Abused: Not on file  . Physically Abused: Not on file  . Sexually Abused: Not on file   Family History  Problem Relation Age of Onset  . Heart attack Father 92       BYPASS  . Crohn's disease Sister   . Stomach cancer Paternal Uncle         diagnosed mid-30s  . Healthy Sister   . Throat cancer Paternal Uncle        hx of smoking  . Cancer Paternal Uncle        unknown type  . Cancer Paternal Uncle        unknown type  . Colon cancer Cousin 74       paternal cousin  . Thyroid cancer Daughter        papillary    Review of Systems  Constitutional: Negative for fever and unexpected weight change.  HENT: Positive for congestion. Negative for dental problem, ear pain, nosebleeds, postnasal drip, rhinorrhea, sinus pressure, sneezing, sore throat and trouble swallowing.   Eyes: Negative for redness and itching.  Respiratory: Positive for cough and shortness of breath. Negative for chest tightness and wheezing.   Cardiovascular: Negative for palpitations and leg swelling.  Gastrointestinal: Negative for nausea and vomiting.  Genitourinary: Positive for dysuria.  Musculoskeletal: Negative for joint swelling.  Skin: Negative for rash.  Allergic/Immunologic: Negative.  Negative for environmental allergies, food allergies and immunocompromised state.  Neurological: Negative for headaches.  Hematological: Does not bruise/bleed easily.  Psychiatric/Behavioral: Negative for dysphoric mood. The patient is not nervous/anxious.       Objective:   Physical Exam Constitutional:      Appearance: Normal appearance.  HENT:     Head: Normocephalic and atraumatic.     Nose: Nose normal.     Mouth/Throat:     Mouth: Mucous membranes are moist.  Eyes:     Extraocular Movements: Extraocular movements intact.     Pupils: Pupils are equal, round, and reactive to light.  Cardiovascular:     Rate and Rhythm: Normal rate and regular rhythm.     Pulses: Normal pulses.     Heart sounds: Normal heart sounds. No murmur. No friction rub.  Pulmonary:     Effort: Pulmonary effort is normal. No respiratory distress.     Breath sounds: Normal breath sounds. No stridor. No wheezing or rhonchi.  Abdominal:     General: Abdomen is flat.   Musculoskeletal:        General: No swelling. Normal range of motion.     Cervical back: Normal range of motion and neck supple. No rigidity or tenderness.  Skin:    General: Skin is warm.  Neurological:     General: No focal deficit present.     Mental Status: He is alert.  Psychiatric:  Mood and Affect: Mood normal.   CT scan 02/02/2019 reviewed with the patient, this was compared with recent CT from 05/28/2018  PFT from 11/11/2017 shows no obstruction, no restriction, normal diffusing capacity     Assessment & Plan:  Shortness of breath with activity Cough Recent diagnosis of adenocarcinoma of the lung post resection Pneumothorax-due to incomplete reexpansion of the lung Abnormal CT showing groundglass changes in the right lung that have remained stable  He does feel his shortness of breath is mostly related to significant exertion and this seems to be getting better  We did talk about trial with an inhaler-does not want to start anything at present but it was reassured that we can start this if needed  He does have a follow-up CT scheduled for August  He should continue with graded exercises  I do not believe there is any gain in putting him through pulmonary function study at the present time  CT scan findings is likely related to incomplete reexpansion of the lung rather than a new air leak  He should be able to tolerate graded exercises -Any activity that may cause acute intrathoracic pressure increase is discouraged  I will follow-up with him in about 3 months  Encouraged to call with any significant concerns

## 2019-03-03 NOTE — Patient Instructions (Signed)
Shortness of breath with moderate exertion, recent surgery for lung cancer Small air pocket around the lungs-postsurgical  Continue graded exercises  We can try an inhaler sometime down the line if any significant worsening of symptoms  I will see you back in about 3 months  Call with any significant concerns

## 2019-03-04 ENCOUNTER — Telehealth: Payer: Self-pay | Admitting: Cardiology

## 2019-03-04 NOTE — Telephone Encounter (Signed)
Called pt back to let him know that Dr. Marlou Porch hadn't read the Echocardiogram results as of yet and when he does someone will call him with them. Pt thanked me for the call.

## 2019-03-04 NOTE — Telephone Encounter (Signed)
Patient calling for echo results 

## 2019-03-09 DIAGNOSIS — Z20828 Contact with and (suspected) exposure to other viral communicable diseases: Secondary | ICD-10-CM | POA: Diagnosis not present

## 2019-03-09 DIAGNOSIS — I10 Essential (primary) hypertension: Secondary | ICD-10-CM | POA: Diagnosis not present

## 2019-03-09 DIAGNOSIS — E7800 Pure hypercholesterolemia, unspecified: Secondary | ICD-10-CM | POA: Diagnosis not present

## 2019-03-09 DIAGNOSIS — Z03818 Encounter for observation for suspected exposure to other biological agents ruled out: Secondary | ICD-10-CM | POA: Diagnosis not present

## 2019-03-10 ENCOUNTER — Telehealth: Payer: Self-pay | Admitting: Cardiology

## 2019-03-10 NOTE — Telephone Encounter (Signed)
Pt would like Dr. Marlou Porch or his Nurse to call him to discuss the results from his recent Echo

## 2019-03-10 NOTE — Telephone Encounter (Signed)
Normal pump function 50%.  Mild mitral valve regurgitatoin Moderate aortic regurgitation.  Candee Furbish, MD

## 2019-03-11 NOTE — Telephone Encounter (Signed)
Called and spoke with patient regarding echo results.  Answered all questions he had at the time.  He states if he has any further questions he will either send a message through the portal or call back.  He will f/u in November with Dr Marlou Porch as ordered.

## 2019-03-11 NOTE — Telephone Encounter (Signed)
Let's have him come in on March 15 at 10am (Qtr day) to discuss. Candee Furbish, MD

## 2019-03-18 ENCOUNTER — Encounter (INDEPENDENT_AMBULATORY_CARE_PROVIDER_SITE_OTHER): Payer: Self-pay | Admitting: Otolaryngology

## 2019-03-18 ENCOUNTER — Other Ambulatory Visit: Payer: Self-pay

## 2019-03-18 ENCOUNTER — Ambulatory Visit (INDEPENDENT_AMBULATORY_CARE_PROVIDER_SITE_OTHER): Payer: Medicare Other | Admitting: Otolaryngology

## 2019-03-18 VITALS — Temp 97.3°F

## 2019-03-18 DIAGNOSIS — K219 Gastro-esophageal reflux disease without esophagitis: Secondary | ICD-10-CM | POA: Diagnosis not present

## 2019-03-18 DIAGNOSIS — J310 Chronic rhinitis: Secondary | ICD-10-CM | POA: Diagnosis not present

## 2019-03-18 NOTE — Progress Notes (Signed)
HPI: Miguel Wolfe is a 74 y.o. male who presents is referred by [name] for evaluation of throat complaints..  Patient was apparently diagnosed with lung cancer last year and underwent a lobectomy and local lymph node resection by Dr. Servando Snare 9 months ago.  He has done well since his surgery however he states that he gets a raspy voice when he talks a lot and he feels a abnormal sensation in the left side of his throat.  He also has a harder time breathing through the right nostril.  Past Medical History:  Diagnosis Date  . Anginal pain ([place])   . Arthritis   . Colon polyps   . Coronary artery disease involving native coronary artery of native heart without angina pectoris 04/23/2016   Myoview 02/08/16: EF 51, inf-lat, apical lateral scar with peri-infarct ischemia; intermediate risk  // LHC 2/18: dLAD 20, oD1 20, pLCx 20, pRCA 80, mRCA 20, dRCA 30, AM 90 >> PCI: 4 x 24 mm Synergy DES to pRCA   . Family history of adverse reaction to anesthesia    Mom vomits and Dad went "nuts"  . Family history of colon cancer   . Family history of stomach cancer   . Fatigue   . GERD (gastroesophageal reflux disease)   . Hemorrhoids   . History of echocardiogram    Echo 02/08/16: Mild LVH, EF 55-60, no RWMA, Gr 1 DD, trivial AI, mild LAE  . Hyperlipidemia   . Hypertension   . PONV (postoperative nausea and vomiting)   . Pre-diabetes   . Pulmonary nodules    Past Surgical History:  Procedure Laterality Date  . CARDIAC CATHETERIZATION    . COLONOSCOPY WITH ESOPHAGOGASTRODUODENOSCOPY (EGD)  2008  . CORONARY STENT INTERVENTION N/A 02/20/2016   Procedure: Coronary Stent Intervention;  Surgeon: Burnell Blanks, MD;  Location: [facility];  Service: Cardiovascular;  Laterality: N/A;  . ESOPHAGOGASTRODUODENOSCOPY    . LEFT HEART CATH AND CORONARY ANGIOGRAPHY N/A 02/20/2016   Procedure: Left Heart Cath and Coronary Angiography;  Surgeon: Burnell Blanks, MD;  Location: [facility];  Service: Cardiovascular;  Laterality: N/A;  . PILONIDAL CYCT    . REFRACTIVE SURGERY     retina tear repair  . THYROIDECTOMY N/A 01/17/2018   Procedure: TOTAL THYROIDECTOMY;  Surgeon: Armandina Gemma, MD;  Location: [facility];  Service: General;  Laterality: N/A;  . VIDEO ASSISTED THORACOSCOPY (VATS)/WEDGE RESECTION Left 06/24/2018   Procedure: VIDEO ASSISTED THORACOSCOPY (VATS)/LUNG RESECTION;  Surgeon: Grace Isaac, MD;  Location: [facility];  Service: Thoracic;  Laterality: Left;  Marland Kitchen VIDEO BRONCHOSCOPY WITH ENDOBRONCHIAL NAVIGATION N/A 11/18/2017   Procedure: VIDEO BRONCHOSCOPY WITH ENDOBRONCHIAL NAVIGATION WITH BIOPSIES OF LEFT AND RIGHT UPPER LOBES;  Surgeon: Grace Isaac, MD;  Location: [facility];  Service: Thoracic;  Laterality: N/A;  . VIDEO BRONCHOSCOPY WITH ENDOBRONCHIAL NAVIGATION N/A 06/11/2018   Procedure: VIDEO BRONCHOSCOPY WITH ENDOBRONCHIAL NAVIGATION;  Surgeon: Grace Isaac, MD;  Location: [facility];  Service: Thoracic;  Laterality: N/A;   Social History   Socioeconomic History  . Marital status: Married    Spouse name: Not on file  . Number of children: Not on file  . Years of education: Not on file  . Highest education level: Not on file  Occupational History  . Not on file  Tobacco Use  . Smoking status: Former Smoker    Packs/day: 1.00    Years: 3.00    Pack years: 3.00    Types:  Cigarettes    Quit date: 02/01/1967    Years since quitting: 52.1  . Smokeless tobacco: Never Used  Substance and Sexual Activity  . Alcohol use: Yes    Comment: a drink before dinner a few times a week  . Drug use: No  . Sexual activity: Not on file  Other Topics Concern  . Not on file  Social History Narrative  . Not on file   Social Determinants of Health   Financial Resource Strain:   . Difficulty of Paying Living Expenses: Not on file  Food Insecurity:   . Worried About Charity fundraiser in the Last Year: Not on file  . Ran Out of Food in the Last Year: Not on file   Transportation Needs:   . Lack of Transportation (Medical): Not on file  . Lack of Transportation (Non-Medical): Not on file  Physical Activity:   . Days of Exercise per Week: Not on file  . Minutes of Exercise per Session: Not on file  Stress:   . Feeling of Stress : Not on file  Social Connections:   . Frequency of Communication with Friends and Family: Not on file  . Frequency of Social Gatherings with Friends and Family: Not on file  . Attends Religious Services: Not on file  . Active Member of Clubs or Organizations: Not on file  . Attends Archivist Meetings: Not on file  . Marital Status: Not on file   Family History  Problem Relation Age of Onset  . Heart attack Father 51       BYPASS  . Crohn's disease Sister   . Stomach cancer Paternal Uncle        diagnosed mid-30s  . Healthy Sister   . Throat cancer Paternal Uncle        hx of smoking  . Cancer Paternal Uncle        unknown type  . Cancer Paternal Uncle        unknown type  . Colon cancer Cousin 52       paternal cousin  . Thyroid cancer Daughter        papillary   Allergies  Allergen Reactions  . Gemfibrozil Other (See Comments)    Muscle pain  . Propranolol     Other reaction(s): SOB  . Niaspan [Niacin Er] Rash and Other (See Comments)    Flushing - aspirin did not mitigate   . Simvastatin Other (See Comments)    Drug-Drug interaction with amlodipine   Prior to Admission medications   Medication Sig Start Date End Date Taking? Authorizing Provider  acetaminophen (TYLENOL) 325 MG tablet Take 650 mg by mouth every 6 (six) hours as needed for moderate pain or headache.     [provider]  amLODipine (NORVASC) 10 MG tablet Take 10 mg by mouth daily.    [provider]  aspirin EC 81 MG tablet Take 81 mg by mouth daily.    [provider]  ibuprofen (ADVIL,MOTRIN) 200 MG tablet Take 400 mg by mouth every 8 (eight) hours as needed for headache or moderate pain.      [provider]  levothyroxine (SYNTHROID) 137 MCG tablet Take 137 mcg by mouth daily before breakfast.  04/11/18   [provider]  losartan (COZAAR) 100 MG tablet Take 100 mg by mouth daily.    [provider]  nitroGLYCERIN (NITROSTAT) 0.4 MG SL tablet Place 1 tablet (0.4 mg total) under the tongue every 5 (  five) minutes as needed for chest pain. 02/20/16   Daune Perch, NP  omega-3 acid ethyl esters (LOVAZA) 1 g capsule Take 2 g by mouth 2 (two) times daily.    [provider]  pantoprazole (PROTONIX) 40 MG tablet TAKE 1 TABLET(40 MG) BY MOUTH DAILY 12/09/18   Jerline Pain, MD  propranolol (INDERAL) 20 MG tablet Take 20 mg by mouth daily. 01/13/16   [provider]  rosuvastatin (CRESTOR) 20 MG tablet TAKE 1 TABLET(20 MG) BY MOUTH DAILY 10/28/18   Jerline Pain, MD  traZODone (DESYREL) 100 MG tablet Take 100 mg by mouth at bedtime as needed for sleep.    [provider]  zolpidem (AMBIEN) 10 MG tablet Take 10 mg by mouth at bedtime as needed for sleep.  04/29/17   [provider]     Positive ROS: Otherwise negative  All other systems have been reviewed and were otherwise negative with the exception of those mentioned in the HPI and as above.  Physical Exam: Constitutional: Alert, well-appearing, no acute distress.  He has essentially normal voice in the office today. Ears: External ears without lesions or tenderness. Ear canals are clear bilaterally with intact, clear TMs.  Nasal: External nose without lesions. Septum is slightly deviated to the right with mild rhinitis..  Nasal passages otherwise clear with clear middle meatus bilaterally and no signs of infection. Oral: Lips and gums without lesions. Tongue and palate mucosa without lesions. Posterior oropharynx clear. Fiberoptic laryngoscopy was performed through the left nostril.  Nasopharynx was clear.  Base of tongue vallecula and epiglottis were normal.  Vocal cords were  clear bilaterally with normal vocal cord mobility.  No vocal cord lesions.  Both piriform sinuses were clear.  I was able to pass the fiberoptic laryngoscope through the upper esophageal sphincter without difficulty and the upper cervical esophagus was clear. Neck: No palpable adenopathy or masses. Respiratory: Breathing comfortably  Skin: No facial/neck lesions or rash noted.  Laryngoscopy  Date/Time: 03/18/2019 3:26 PM Performed by: Rozetta Nunnery, MD Authorized by: Rozetta Nunnery, MD   Consent:    Consent obtained:  Verbal   Consent given by:  Patient   Risks discussed:  Pain Procedure details:    Indications: direct visualization of the upper aerodigestive tract     Medication:  Afrin   Scope location: left nare   Sinus:    Left nasopharynx: normal   Mouth:    Oropharynx: normal     Vallecula: normal     Base of tongue: normal     Epiglottis: normal   Throat:    True vocal cords: normal   Comments:     On fiberoptic laryngoscopy the hypopharynx and larynx was clear.  Patient had normal vocal cords bilaterally with normal vocal cord mobility.  He does have some slight clear mucus on the vocal cords that cleared easily with coughing.    Assessment: Normal upper airway examination and laryngeal examination on fiberoptic laryngoscopy. Patient with history of GE reflux disease on pantoprazole.  I suspect the occasional raspiness is probably related somewhat to reflux.  Plan: Recommended taking the pantoprazole before dinner as this will provide better nighttime coverage when silent reflux occurs most often. Recommended regular use of Nasacort 2 sprays each nostril at night as this will help improve his breathing especially through the right side of his nose. I reassured him of normal hypopharyngeal laryngeal examination with no evidence of neoplasm.   Radene Journey, MD  CC:

## 2019-03-19 ENCOUNTER — Encounter: Payer: Medicare Other | Admitting: Cardiothoracic Surgery

## 2019-03-23 ENCOUNTER — Ambulatory Visit
Admission: RE | Admit: 2019-03-23 | Discharge: 2019-03-23 | Disposition: A | Discharge status: Home / Self Care | Payer: Medicare Other | Source: Ambulatory Visit | Attending: Family Medicine | Admitting: Family Medicine

## 2019-03-23 DIAGNOSIS — Z136 Encounter for screening for cardiovascular disorders: Secondary | ICD-10-CM | POA: Diagnosis not present

## 2019-03-23 DIAGNOSIS — Z87891 Personal history of nicotine dependence: Secondary | ICD-10-CM

## 2019-03-31 DIAGNOSIS — Z85828 Personal history of other malignant neoplasm of skin: Secondary | ICD-10-CM | POA: Diagnosis not present

## 2019-03-31 DIAGNOSIS — Z23 Encounter for immunization: Secondary | ICD-10-CM | POA: Diagnosis not present

## 2019-03-31 DIAGNOSIS — L821 Other seborrheic keratosis: Secondary | ICD-10-CM | POA: Diagnosis not present

## 2019-03-31 DIAGNOSIS — D485 Neoplasm of uncertain behavior of skin: Secondary | ICD-10-CM | POA: Diagnosis not present

## 2019-03-31 DIAGNOSIS — D2272 Melanocytic nevi of left lower limb, including hip: Secondary | ICD-10-CM | POA: Diagnosis not present

## 2019-03-31 DIAGNOSIS — L570 Actinic keratosis: Secondary | ICD-10-CM | POA: Diagnosis not present

## 2019-03-31 DIAGNOSIS — L578 Other skin changes due to chronic exposure to nonionizing radiation: Secondary | ICD-10-CM | POA: Diagnosis not present

## 2019-03-31 DIAGNOSIS — Z86018 Personal history of other benign neoplasm: Secondary | ICD-10-CM | POA: Diagnosis not present

## 2019-03-31 DIAGNOSIS — D225 Melanocytic nevi of trunk: Secondary | ICD-10-CM | POA: Diagnosis not present

## 2019-04-27 DIAGNOSIS — Z85118 Personal history of other malignant neoplasm of bronchus and lung: Secondary | ICD-10-CM | POA: Diagnosis not present

## 2019-04-27 DIAGNOSIS — E890 Postprocedural hypothyroidism: Secondary | ICD-10-CM | POA: Diagnosis not present

## 2019-04-27 DIAGNOSIS — I2510 Atherosclerotic heart disease of native coronary artery without angina pectoris: Secondary | ICD-10-CM | POA: Diagnosis not present

## 2019-04-27 DIAGNOSIS — I10 Essential (primary) hypertension: Secondary | ICD-10-CM | POA: Diagnosis not present

## 2019-04-28 DIAGNOSIS — Z85850 Personal history of malignant neoplasm of thyroid: Secondary | ICD-10-CM | POA: Diagnosis not present

## 2019-04-28 DIAGNOSIS — E890 Postprocedural hypothyroidism: Secondary | ICD-10-CM | POA: Diagnosis not present

## 2019-04-28 DIAGNOSIS — Z9889 Other specified postprocedural states: Secondary | ICD-10-CM | POA: Diagnosis not present

## 2019-04-28 DIAGNOSIS — E230 Hypopituitarism: Secondary | ICD-10-CM | POA: Diagnosis not present

## 2019-04-28 DIAGNOSIS — R5383 Other fatigue: Secondary | ICD-10-CM | POA: Diagnosis not present

## 2019-04-28 DIAGNOSIS — R7303 Prediabetes: Secondary | ICD-10-CM | POA: Diagnosis not present

## 2019-06-09 DIAGNOSIS — E890 Postprocedural hypothyroidism: Secondary | ICD-10-CM | POA: Diagnosis not present

## 2019-06-16 DIAGNOSIS — H25813 Combined forms of age-related cataract, bilateral: Secondary | ICD-10-CM | POA: Diagnosis not present

## 2019-06-16 DIAGNOSIS — H40003 Preglaucoma, unspecified, bilateral: Secondary | ICD-10-CM | POA: Diagnosis not present

## 2019-06-16 DIAGNOSIS — Z8669 Personal history of other diseases of the nervous system and sense organs: Secondary | ICD-10-CM | POA: Diagnosis not present

## 2019-06-16 DIAGNOSIS — H527 Unspecified disorder of refraction: Secondary | ICD-10-CM | POA: Diagnosis not present

## 2019-06-30 DIAGNOSIS — M7541 Impingement syndrome of right shoulder: Secondary | ICD-10-CM | POA: Diagnosis not present

## 2019-06-30 DIAGNOSIS — M25511 Pain in right shoulder: Secondary | ICD-10-CM | POA: Diagnosis not present

## 2019-07-08 DIAGNOSIS — Z85118 Personal history of other malignant neoplasm of bronchus and lung: Secondary | ICD-10-CM | POA: Diagnosis not present

## 2019-07-08 DIAGNOSIS — I2510 Atherosclerotic heart disease of native coronary artery without angina pectoris: Secondary | ICD-10-CM | POA: Diagnosis not present

## 2019-07-08 DIAGNOSIS — E890 Postprocedural hypothyroidism: Secondary | ICD-10-CM | POA: Diagnosis not present

## 2019-07-08 DIAGNOSIS — E782 Mixed hyperlipidemia: Secondary | ICD-10-CM | POA: Diagnosis not present

## 2019-07-08 DIAGNOSIS — I10 Essential (primary) hypertension: Secondary | ICD-10-CM | POA: Diagnosis not present

## 2019-07-24 DIAGNOSIS — K068 Other specified disorders of gingiva and edentulous alveolar ridge: Secondary | ICD-10-CM | POA: Diagnosis not present

## 2019-08-03 ENCOUNTER — Ambulatory Visit (HOSPITAL_COMMUNITY)
Admission: RE | Admit: 2019-08-03 | Discharge: 2019-08-03 | Disposition: A | Discharge status: Home / Self Care | Payer: Medicare Other | Source: Ambulatory Visit | Attending: Internal Medicine | Admitting: Internal Medicine

## 2019-08-03 ENCOUNTER — Other Ambulatory Visit: Payer: Self-pay

## 2019-08-03 ENCOUNTER — Inpatient Hospital Stay: Payer: Medicare Other | Attending: Internal Medicine

## 2019-08-03 DIAGNOSIS — C3490 Malignant neoplasm of unspecified part of unspecified bronchus or lung: Secondary | ICD-10-CM | POA: Diagnosis not present

## 2019-08-03 DIAGNOSIS — C3412 Malignant neoplasm of upper lobe, left bronchus or lung: Secondary | ICD-10-CM | POA: Insufficient documentation

## 2019-08-03 DIAGNOSIS — J984 Other disorders of lung: Secondary | ICD-10-CM | POA: Diagnosis not present

## 2019-08-03 DIAGNOSIS — I2510 Atherosclerotic heart disease of native coronary artery without angina pectoris: Secondary | ICD-10-CM | POA: Diagnosis not present

## 2019-08-03 DIAGNOSIS — I700 Atherosclerosis of aorta: Secondary | ICD-10-CM | POA: Diagnosis not present

## 2019-08-03 LAB — CBC WITH DIFFERENTIAL (CANCER CENTER ONLY)
Abs Immature Granulocytes: 0.01 10*3/uL (ref 0.00–0.07)
Basophils Absolute: 0 10*3/uL (ref 0.0–0.1)
Basophils Relative: 0 %
Eosinophils Absolute: 0.2 10*3/uL (ref 0.0–0.5)
Eosinophils Relative: 4 %
HCT: 44.4 % (ref 39.0–52.0)
Hemoglobin: 15 g/dL (ref 13.0–17.0)
Immature Granulocytes: 0 %
Lymphocytes Relative: 32 %
Lymphs Abs: 2 10*3/uL (ref 0.7–4.0)
MCH: 30.1 pg (ref 26.0–34.0)
MCHC: 33.8 g/dL (ref 30.0–36.0)
MCV: 89.2 fL (ref 80.0–100.0)
Monocytes Absolute: 0.6 10*3/uL (ref 0.1–1.0)
Monocytes Relative: 9 %
Neutro Abs: 3.5 10*3/uL (ref 1.7–7.7)
Neutrophils Relative %: 55 %
Platelet Count: 160 10*3/uL (ref 150–400)
RBC: 4.98 MIL/uL (ref 4.22–5.81)
RDW: 12.4 % (ref 11.5–15.5)
WBC Count: 6.3 10*3/uL (ref 4.0–10.5)
nRBC: 0 % (ref 0.0–0.2)

## 2019-08-03 LAB — CMP (CANCER CENTER ONLY)
ALT: 14 U/L (ref 0–44)
AST: 18 U/L (ref 15–41)
Albumin: 4.4 g/dL (ref 3.5–5.0)
Alkaline Phosphatase: 66 U/L (ref 38–126)
Anion gap: 9 (ref 5–15)
BUN: 13 mg/dL (ref 8–23)
CO2: 29 mmol/L (ref 22–32)
Calcium: 9.4 mg/dL (ref 8.9–10.3)
Chloride: 104 mmol/L (ref 98–111)
Creatinine: 0.9 mg/dL (ref 0.61–1.24)
GFR, Est AFR Am: >60 mL/min (ref >60)
GFR, Estimated: >60 mL/min (ref >60)
Glucose, Bld: 105 mg/dL — ABNORMAL HIGH (ref 70–99)
Potassium: 3.9 mmol/L (ref 3.5–5.1)
Sodium: 142 mmol/L (ref 135–145)
Total Bilirubin: 0.9 mg/dL (ref 0.3–1.2)
Total Protein: 7 g/dL (ref 6.5–8.1)

## 2019-08-03 MED ORDER — SODIUM CHLORIDE (PF) 0.9 % IJ SOLN
INTRAMUSCULAR | Status: AC
  Filled 2019-08-03: qty 50

## 2019-08-03 MED ORDER — IOHEXOL 300 MG/ML  SOLN
75.0000 mL | Freq: Once | INTRAMUSCULAR | Status: AC | PRN
  Administered 2019-08-03: 75 mL via INTRAVENOUS

## 2019-08-04 ENCOUNTER — Inpatient Hospital Stay (HOSPITAL_BASED_OUTPATIENT_CLINIC_OR_DEPARTMENT_OTHER): Payer: Medicare Other | Admitting: Internal Medicine

## 2019-08-04 ENCOUNTER — Other Ambulatory Visit: Payer: Self-pay

## 2019-08-04 ENCOUNTER — Telehealth: Payer: Self-pay | Admitting: Internal Medicine

## 2019-08-04 ENCOUNTER — Encounter: Payer: Self-pay | Admitting: Internal Medicine

## 2019-08-04 VITALS — BP 138/73 | HR 65 | Temp 97.8°F | Resp 20 | Ht 71.0 in | Wt 164.3 lb

## 2019-08-04 DIAGNOSIS — C3412 Malignant neoplasm of upper lobe, left bronchus or lung: Secondary | ICD-10-CM | POA: Diagnosis not present

## 2019-08-04 DIAGNOSIS — C3490 Malignant neoplasm of unspecified part of unspecified bronchus or lung: Secondary | ICD-10-CM | POA: Diagnosis not present

## 2019-08-04 DIAGNOSIS — I10 Essential (primary) hypertension: Secondary | ICD-10-CM | POA: Diagnosis not present

## 2019-08-04 NOTE — Progress Notes (Signed)
[facility] [phone]   [phone]  OFFICE PROGRESS NOTE  [name], MD [place] [address]  DIAGNOSIS: Stage IA (T1c, N0, M0) non-small cell lung cancer, adenocarcinoma with no actionable mutation and negative PDL 1 expression diagnosed in June 2020.   PRIOR THERAPY: Status post left upper lobectomy with lymph node dissection on June 24, 2018.  CURRENT THERAPY: Observation.  INTERVAL HISTORY: Miguel Wolfe 74 y.o. male returns to the clinic today for follow-up visit. His wife was available by phone during the visit. The patient is feeling fine today with no concerning complaints except for occasional pain on the left side of the chest at the surgical scar. The patient denied having any shortness of breath, cough or hemoptysis. He has no nausea, vomiting, diarrhea or constipation. He denied having any headache or visual changes. He continues to have some weakness in the neck muscles especially at the end of the day. He had several surgeries performed in the last 2 years including thyroid surgery. He had repeat CT scan of the chest performed recently and he is here for evaluation and discussion of his scan results.  MEDICAL HISTORY: Past Medical History:  Diagnosis Date  . Anginal pain ([place])   . Arthritis   . Colon polyps   . Coronary artery disease involving native coronary artery of native heart without angina pectoris 04/23/2016   Myoview 02/08/16: EF 51, inf-lat, apical lateral scar with peri-infarct ischemia; intermediate risk  // LHC 2/18: dLAD 20, oD1 20, pLCx 20, pRCA 80, mRCA 20, dRCA 30, AM 90 >> PCI: 4 x 24 mm Synergy DES to pRCA   . Family history of adverse reaction to anesthesia    Mom vomits and Dad went "nuts"  . Family history of colon cancer   . Family history of stomach cancer   . Fatigue   . GERD (gastroesophageal reflux disease)   . Hemorrhoids   . History of echocardiogram    Echo 02/08/16: Mild  LVH, EF 55-60, no RWMA, Gr 1 DD, trivial AI, mild LAE  . Hyperlipidemia   . Hypertension   . PONV (postoperative nausea and vomiting)   . Pre-diabetes   . Pulmonary nodules     ALLERGIES:  is allergic to gemfibrozil, propranolol, niaspan [niacin er], and simvastatin.  MEDICATIONS:  Current Outpatient Medications  Medication Sig Dispense Refill  . acetaminophen (TYLENOL) 325 MG tablet Take 650 mg by mouth every 6 (six) hours as needed for moderate pain or headache.     Marland Kitchen amLODipine (NORVASC) 10 MG tablet Take 10 mg by mouth daily.    Marland Kitchen aspirin EC 81 MG tablet Take 81 mg by mouth daily.    Marland Kitchen ibuprofen (ADVIL,MOTRIN) 200 MG tablet Take 400 mg by mouth every 8 (eight) hours as needed for headache or moderate pain.     Marland Kitchen levothyroxine (SYNTHROID) 150 MCG tablet Take 150 mcg by mouth daily before breakfast.     . losartan (COZAAR) 100 MG tablet Take 100 mg by mouth daily.    Marland Kitchen omega-3 acid ethyl esters (LOVAZA) 1 g capsule Take 2 g by mouth 2 (two) times daily.    . pantoprazole (PROTONIX) 40 MG tablet TAKE 1 TABLET(40 MG) BY MOUTH DAILY 90 tablet 3  . propranolol (INDERAL) 20 MG tablet Take 20 mg by mouth daily.  2  . rosuvastatin (CRESTOR) 20 MG tablet TAKE 1 TABLET(20 MG) BY MOUTH DAILY 90 tablet 3  .  traZODone (DESYREL) 100 MG tablet Take 100 mg by mouth at bedtime as needed for sleep.    Marland Kitchen zolpidem (AMBIEN) 10 MG tablet Take 10 mg by mouth at bedtime as needed for sleep.   0  . nitroGLYCERIN (NITROSTAT) 0.4 MG SL tablet Place 1 tablet (0.4 mg total) under the tongue every 5 (five) minutes as needed for chest pain. (Patient not taking: Reported on 08/04/2019) 25 tablet 12   No current facility-administered medications for this visit.    SURGICAL HISTORY:  Past Surgical History:  Procedure Laterality Date  . CARDIAC CATHETERIZATION    . COLONOSCOPY WITH ESOPHAGOGASTRODUODENOSCOPY (EGD)  2008  . CORONARY STENT INTERVENTION N/A 02/20/2016   Procedure: Coronary Stent Intervention;   Surgeon: Burnell Blanks, MD;  Location: [facility];  Service: Cardiovascular;  Laterality: N/A;  . ESOPHAGOGASTRODUODENOSCOPY    . LEFT HEART CATH AND CORONARY ANGIOGRAPHY N/A 02/20/2016   Procedure: Left Heart Cath and Coronary Angiography;  Surgeon: Burnell Blanks, MD;  Location: [facility];  Service: Cardiovascular;  Laterality: N/A;  . PILONIDAL CYCT    . REFRACTIVE SURGERY     retina tear repair  . THYROIDECTOMY N/A 01/17/2018   Procedure: TOTAL THYROIDECTOMY;  Surgeon: Armandina Gemma, MD;  Location: [facility];  Service: General;  Laterality: N/A;  . VIDEO ASSISTED THORACOSCOPY (VATS)/WEDGE RESECTION Left 06/24/2018   Procedure: VIDEO ASSISTED THORACOSCOPY (VATS)/LUNG RESECTION;  Surgeon: Grace Isaac, MD;  Location: [facility];  Service: Thoracic;  Laterality: Left;  Marland Kitchen VIDEO BRONCHOSCOPY WITH ENDOBRONCHIAL NAVIGATION N/A 11/18/2017   Procedure: VIDEO BRONCHOSCOPY WITH ENDOBRONCHIAL NAVIGATION WITH BIOPSIES OF LEFT AND RIGHT UPPER LOBES;  Surgeon: Grace Isaac, MD;  Location: [facility];  Service: Thoracic;  Laterality: N/A;  . VIDEO BRONCHOSCOPY WITH ENDOBRONCHIAL NAVIGATION N/A 06/11/2018   Procedure: VIDEO BRONCHOSCOPY WITH ENDOBRONCHIAL NAVIGATION;  Surgeon: Grace Isaac, MD;  Location: [facility];  Service: Thoracic;  Laterality: N/A;    REVIEW OF SYSTEMS:  A comprehensive review of systems was negative.   PHYSICAL EXAMINATION: General appearance: alert, cooperative and no distress Head: Normocephalic, without obvious abnormality, atraumatic Neck: no adenopathy, no JVD, supple, symmetrical, trachea midline and thyroid not enlarged, symmetric, no tenderness/mass/nodules Lymph nodes: Cervical, supraclavicular, and axillary nodes normal. Resp: clear to auscultation bilaterally Back: symmetric, no curvature. ROM normal. No CVA tenderness. Cardio: regular rate and rhythm, S1, S2 normal, no murmur, click, rub or gallop GI: soft, non-tender; bowel sounds normal;  no masses,  no organomegaly Extremities: extremities normal, atraumatic, no cyanosis or edema  ECOG PERFORMANCE STATUS: 1 - Symptomatic but completely ambulatory  Blood pressure (!) 138/73, pulse 65, temperature 97.8 F (36.6 C), temperature source Temporal, resp. rate 20, height 5\' 11"  (1.803 m), weight 164 lb 4.8 oz (74.5 kg), SpO2 96 %.  LABORATORY DATA: Lab Results  Component Value Date   WBC 6.3 08/03/2019   HGB 15.0 08/03/2019   HCT 44.4 08/03/2019   MCV 89.2 08/03/2019   PLT 160 08/03/2019      Chemistry      Component Value Date/Time   NA 142 08/03/2019 1301   NA 143 04/24/2016 0935   K 3.9 08/03/2019 1301   CL 104 08/03/2019 1301   CO2 29 08/03/2019 1301   BUN 13 08/03/2019 1301   BUN 17 04/24/2016 0935   CREATININE 0.90 08/03/2019 1301      Component Value Date/Time   CALCIUM 9.4 08/03/2019 1301   ALKPHOS 66 08/03/2019 1301   AST 18 08/03/2019  1301   ALT 14 08/03/2019 1301   BILITOT 0.9 08/03/2019 1301       RADIOGRAPHIC STUDIES: CT Chest W Contrast  Result Date: 08/03/2019 CLINICAL DATA:  Lung cancer. EXAM: CT CHEST WITH CONTRAST TECHNIQUE: Multidetector CT imaging of the chest was performed during intravenous contrast administration. CONTRAST:  52mL OMNIPAQUE IOHEXOL 300 MG/ML  SOLN COMPARISON:  02/02/2019. FINDINGS: Cardiovascular: Atherosclerotic calcification of the aorta and coronary arteries. Heart size normal. Left ventricle appears dilated. No pericardial effusion. Mediastinum/Nodes: Thyroidectomy. No pathologically enlarged mediastinal, hilar or axillary lymph nodes. Esophagus is unremarkable. Lungs/Pleura: Left upper lobectomy with scarring in the left hemithorax. A 2.1 x 3.3 cm sub solid mass in the apical segment right upper lobe (7/23) has an internal linear component and is unchanged. Tiny loculated collections of pleural fluid and air in the anterior and posterior aspects of the medial left hemithorax, decreased from prior. Airway is unremarkable.  Left hemithorax, unchanged. Upper Abdomen: Visualized portions of the liver, gallbladder, adrenal glands, kidneys, spleen, pancreas, stomach and bowel are grossly unremarkable. Musculoskeletal: Degenerative changes in the spine. No worrisome lytic or sclerotic lesions. IMPRESSION: 1. Left upper lobectomy. No evidence of metastatic disease. Tiny collections of loculated pleural fluid and air in the left hemithorax, improved from prior. 2. Predominantly ground-glass mass in the apical segment right upper lobe with an internal linear component, stable. Continued attention on follow-up exams is warranted as adenocarcinoma cannot be excluded. 3. Aortic atherosclerosis (ICD10-I70.0). Coronary artery calcification. Electronically Signed   By: Lorin Picket M.D.   On: 08/03/2019 15:43    ASSESSMENT AND PLAN: This is a very pleasant 74 years old white male with stage Ia non-small cell lung cancer, adenocarcinoma status post left upper lobectomy with lymph node dissection under the care of Dr. Servando Snare in June 2020. The patient is currently on observation and he is feeling fine today with no concerning complaints. He had repeat CT scan of the chest performed recently. I personally and independently reviewed the scans and discussed the results with the patient and his wife today. His scan showed no concerning findings for disease recurrence or metastasis. I recommended for the patient to continue on observation with repeat CT scan of the chest in 6 months. To continue on observation with repeat CT scan of the chest in 6 months. The patient was advised to call immediately if he has any concerning symptoms in the interval. The patient voices understanding of current disease status and treatment options and is in agreement with the current care plan.  All questions were answered. The patient knows to call the clinic with any problems, questions or concerns. We can certainly see the patient much sooner if  necessary.   Disclaimer: This note was dictated with voice recognition software. Similar sounding words can inadvertently be transcribed and may not be corrected upon review.

## 2019-08-04 NOTE — Telephone Encounter (Signed)
Scheduled per 7/27 los. Printed avs and calendar for pt. Pt requested f/u the next day after scan.

## 2019-08-17 DIAGNOSIS — K1230 Oral mucositis (ulcerative), unspecified: Secondary | ICD-10-CM | POA: Diagnosis not present

## 2019-08-20 ENCOUNTER — Encounter: Payer: Medicare Other | Admitting: Cardiothoracic Surgery

## 2019-08-25 DIAGNOSIS — I10 Essential (primary) hypertension: Secondary | ICD-10-CM | POA: Diagnosis not present

## 2019-08-25 DIAGNOSIS — E890 Postprocedural hypothyroidism: Secondary | ICD-10-CM | POA: Diagnosis not present

## 2019-08-25 DIAGNOSIS — E782 Mixed hyperlipidemia: Secondary | ICD-10-CM | POA: Diagnosis not present

## 2019-08-25 DIAGNOSIS — I2510 Atherosclerotic heart disease of native coronary artery without angina pectoris: Secondary | ICD-10-CM | POA: Diagnosis not present

## 2019-08-25 DIAGNOSIS — Z85118 Personal history of other malignant neoplasm of bronchus and lung: Secondary | ICD-10-CM | POA: Diagnosis not present

## 2019-09-24 DIAGNOSIS — E782 Mixed hyperlipidemia: Secondary | ICD-10-CM | POA: Diagnosis not present

## 2019-09-24 DIAGNOSIS — Z85118 Personal history of other malignant neoplasm of bronchus and lung: Secondary | ICD-10-CM | POA: Diagnosis not present

## 2019-09-24 DIAGNOSIS — E890 Postprocedural hypothyroidism: Secondary | ICD-10-CM | POA: Diagnosis not present

## 2019-09-24 DIAGNOSIS — I2510 Atherosclerotic heart disease of native coronary artery without angina pectoris: Secondary | ICD-10-CM | POA: Diagnosis not present

## 2019-09-24 DIAGNOSIS — I10 Essential (primary) hypertension: Secondary | ICD-10-CM | POA: Diagnosis not present

## 2019-10-06 DIAGNOSIS — Z23 Encounter for immunization: Secondary | ICD-10-CM | POA: Diagnosis not present

## 2019-10-20 DIAGNOSIS — Z23 Encounter for immunization: Secondary | ICD-10-CM | POA: Diagnosis not present

## 2019-10-21 DIAGNOSIS — I10 Essential (primary) hypertension: Secondary | ICD-10-CM | POA: Diagnosis not present

## 2019-10-21 DIAGNOSIS — E890 Postprocedural hypothyroidism: Secondary | ICD-10-CM | POA: Diagnosis not present

## 2019-10-21 DIAGNOSIS — I2510 Atherosclerotic heart disease of native coronary artery without angina pectoris: Secondary | ICD-10-CM | POA: Diagnosis not present

## 2019-10-21 DIAGNOSIS — E782 Mixed hyperlipidemia: Secondary | ICD-10-CM | POA: Diagnosis not present

## 2019-10-21 DIAGNOSIS — Z85118 Personal history of other malignant neoplasm of bronchus and lung: Secondary | ICD-10-CM | POA: Diagnosis not present

## 2019-10-21 NOTE — Progress Notes (Signed)
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 SURGICAL PROCEDURE:  Left video-assisted thoracoscopy, left upper lobectomy with lymph node dissection, and intercostal nerve block.  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 - MARGINS NOT INVOLVED. - TUMOR CONFINED WITHIN THYROID CAPSULE.  History of Present Illness:     Patient comes in today  after follow up  ct chest 13  months postop - follow up ct showed small left apical air space. Patient's main complaint is fatigue.  He does have some shortness of breath with exertion  He has been fully vaccinated including booster for Covid   PZubrod Score: At the time of surgery this patient's most appropriate activity status/level should be described as: _0     0    Normal activity, no symptoms _1     1    Restricted in physical strenuous activity but ambulatory, able to do out light work _2     2    Ambulatory and capable of self care, unable to do work activities, up and about                 >50 % of waking hours                                                                                   _3     3     Only limited self care, in bed greater than 50% of waking hours _4     4    Completely disabled, no self care, confined to bed or chair _5     5    Moribund  Social History   Tobacco Use  Smoking Status Former Smoker  . Packs/day: 1.00  . Years: 3.00  . Pack years: 3.00  . Types: Cigarettes  . Quit date: 02/01/1967  . Years since quitting: 52.7  Smokeless Tobacco Never Used       Allergies  Allergen Reactions  . Gemfibrozil Other (See Comments)    Muscle pain  . Propranolol     Other reaction(s): SOB  Only had issue with Slow release propranolol  . Niaspan [Niacin Er] Rash and Other (See Comments)    Flushing - aspirin did not mitigate   . Simvastatin Other (See Comments)  Drug-Drug interaction with amlodipine    Current Outpatient Medications  Medication Sig Dispense Refill  . acetaminophen (TYLENOL) 325 MG tablet Take 650 mg by mouth every 6 (six) hours as needed for moderate pain or headache.     Marland Kitchen amLODipine (NORVASC) 10 MG tablet Take 10 mg by mouth daily.    Marland Kitchen aspirin EC 81 MG tablet Take 81 mg by mouth daily.    Marland Kitchen ibuprofen (ADVIL,MOTRIN) 200 MG tablet Take 400 mg by mouth every 8 (eight) hours as needed for headache or moderate pain.     Marland Kitchen levothyroxine (SYNTHROID) 150 MCG tablet Take 150 mcg by mouth daily before breakfast.     . losartan (COZAAR) 100 MG tablet Take 100 mg by mouth daily.    . nitroGLYCERIN (NITROSTAT) 0.4 MG SL tablet Place 1 tablet (0.4 mg total) under the tongue every 5 (five) minutes as needed for chest pain. 25 tablet 12  . omega-3 acid ethyl esters (LOVAZA) 1 g capsule Take 2 g by mouth 2 (two) times daily.    . pantoprazole (PROTONIX) 40 MG tablet TAKE 1 TABLET(40 MG) BY MOUTH DAILY 90 tablet 3  . propranolol (INDERAL) 20 MG tablet Take 20 mg by mouth daily.  2  . rosuvastatin (CRESTOR) 20 MG tablet TAKE 1 TABLET(20 MG) BY MOUTH DAILY 90 tablet 3  . traZODone (DESYREL) 100 MG tablet Take 100 mg by mouth at bedtime as needed for sleep.     Marland Kitchen zolpidem (AMBIEN) 10 MG tablet Take 10 mg by mouth at bedtime as needed for sleep.   0   No current facility-administered medications for this visit.       Physical Exam: BP (!) 150/85   Pulse 65   Temp 97.8 F (36.6 C) (Skin)   Resp 20   Ht _0  (1.803 m)   Wt 162 lb (73.5 kg)   SpO2 96% Comment: RA  BMI 22.59 kg/m   General appearance: alert, cooperative and no distress Head: Normocephalic, without obvious abnormality, atraumatic Neck: no adenopathy, no carotid bruit, no JVD, supple, symmetrical, trachea midline and thyroid not enlarged, symmetric, no tenderness/mass/nodules Lymph nodes: Cervical, supraclavicular, and axillary nodes normal. Resp: clear to auscultation bilaterally Cardio: regular rate and rhythm, S1, S2 normal, no murmur, click, rub or gallop GI: soft, non-tender; bowel sounds normal; no masses,  no organomegaly Extremities: extremities normal, atraumatic, no cyanosis or edema and Homans sign is negative, no sign of DVT Neurologic: Grossly normal  Diagnostic Studies & Laboratory data:         Recent Radiology Findings: CLINICAL DATA:  Lung cancer.  EXAM: CT CHEST WITH CONTRAST  TECHNIQUE: Multidetector CT imaging of the chest was performed during intravenous contrast administration.  CONTRAST:  110m OMNIPAQUE IOHEXOL 300 MG/ML  SOLN  COMPARISON:  02/02/2019.  FINDINGS: Cardiovascular: Atherosclerotic calcification of the aorta and coronary arteries. Heart size normal. Left ventricle appears dilated. No pericardial effusion.  Mediastinum/Nodes: Thyroidectomy. No pathologically enlarged mediastinal, hilar or axillary lymph nodes. Esophagus is unremarkable.  Lungs/Pleura: Left upper lobectomy with scarring in the left hemithorax. A 2.1 x 3.3 cm sub solid mass in the apical segment right upper lobe (7/23) has an internal linear component and is unchanged. Tiny loculated collections of pleural fluid and air in the anterior and  posterior aspects of the medial left hemithorax, decreased from prior. Airway is unremarkable. Left hemithorax, unchanged.  Upper Abdomen: Visualized portions of the liver, gallbladder, adrenal glands, kidneys, spleen, pancreas, stomach and bowel are grossly unremarkable.  Musculoskeletal: Degenerative changes in the spine. No worrisome lytic or sclerotic lesions.  IMPRESSION: 1. Left upper lobectomy. No evidence of metastatic disease. Tiny collections of loculated pleural fluid and air in the left hemithorax, improved from prior. 2. Predominantly ground-glass mass in the apical segment right upper lobe with an internal linear component, stable. Continued attention on follow-up exams is warranted as adenocarcinoma cannot be excluded. 3. Aortic atherosclerosis (ICD10-I70.0). Coronary artery calcification.   Electronically Signed   By: Lorin Picket M.D.   On: 08/03/2019 15:43    CLINICAL DATA:  Lung cancer.  EXAM: CT CHEST WITH CONTRAST  TECHNIQUE: Multidetector CT imaging of the chest was performed during intravenous contrast administration.  CONTRAST:  68mL OMNIPAQUE IOHEXOL 300 MG/ML  SOLN  COMPARISON:  02/02/2019.  FINDINGS: Cardiovascular: Atherosclerotic calcification of the aorta and coronary arteries. Heart size normal. Left ventricle appears dilated. No pericardial effusion.  Mediastinum/Nodes: Thyroidectomy. No pathologically enlarged mediastinal, hilar or axillary lymph nodes. Esophagus is unremarkable.  Lungs/Pleura: Left upper lobectomy with scarring in the left hemithorax. A 2.1 x 3.3 cm sub solid mass in the apical segment right upper lobe (7/23) has an internal linear component and is unchanged. Tiny loculated collections of pleural fluid and air in the anterior and posterior aspects of the medial left hemithorax, decreased from prior. Airway is unremarkable. Left hemithorax, unchanged.  Upper Abdomen: Visualized portions of the  liver, gallbladder, adrenal glands, kidneys, spleen, pancreas, stomach and bowel are grossly unremarkable.  Musculoskeletal: Degenerative changes in the spine. No worrisome lytic or sclerotic lesions.  IMPRESSION: 1. Left upper lobectomy. No evidence of metastatic disease. Tiny collections of loculated pleural fluid and air in the left hemithorax, improved from prior. 2. Predominantly ground-glass mass in the apical segment right upper lobe with an internal linear component, stable. Continued attention on follow-up exams is warranted as adenocarcinoma cannot be excluded. 3. Aortic atherosclerosis (ICD10-I70.0). Coronary artery calcification.   Electronically Signed   By: Lorin Picket M.D.   On: 08/03/2019 15:43     I have independently reviewed the above radiology findings and reviewed findings  with the patient.  Recent Labs: Lab Results  Component Value Date   WBC 6.3 08/03/2019   HGB 15.0 08/03/2019   HCT 44.4 08/03/2019   PLT 160 08/03/2019   GLUCOSE 105 (H) 08/03/2019   CHOL 151 01/21/2017   TRIG 177 (H) 01/21/2017   HDL 48 01/21/2017   LDLCALC 68 01/21/2017   ALT 14 08/03/2019   AST 18 08/03/2019   NA 142 08/03/2019   K 3.9 08/03/2019   CL 104 08/03/2019   CREATININE 0.90 08/03/2019   BUN 13 08/03/2019   CO2 29 08/03/2019   TSH 10.736 (H) 01/24/2018   INR 1.1 06/20/2018   HGBA1C 5.9 (H) 06/09/2018    PATH: Diagnosis 1. Lung, resection (segmental or lobe), left upper lobe - ADENOCARCINOMA, 2.3 CM. - THREE BENIGN LYMPH NODES (0/3). - MARGINS NOT INVOLVED. 2. Lymph node, biopsy, 10 Wolfe node - ANTHRACOTIC LYMPH NODE. - NO METASTATIC CARCINOMA IDENTIFIED. 3. Lymph node, biopsy, 10 Wolfe #2 - ANTHRACOTIC LYMPH NODE. - NO METASTATIC CARCINOMA IDENTIFIED. 4. Lymph node, biopsy, 11 Wolfe node - ANTHRACOTIC LYMPH NODE. - NO METASTATIC CARCINOMA IDENTIFIED. 5. Lymph node, biopsy, 4 Wolfe node - ANTHRACOTIC LYMPH NODE. - NO METASTATIC CARCINOMA IDENTIFIED. 6.  Lymph node, biopsy, 4 Wolfe #2 node - ANTHRACOTIC LYMPH NODE. - NO METASTATIC CARCINOMA IDENTIFIED. 7. Lymph node, biopsy, 11 Wolfe #2 node - ANTHRACOTIC LYMPH NODE. - NO METASTATIC  CARCINOMA IDENTIFIED. 8. Lymph node, biopsy, 12 Wolfe node - ANTHRACOTIC LYMPH NODE. - NO METASTATIC CARCINOMA IDENTIFIED. 9. Lymph node, biopsy, 12 Wolfe #2 node - ANTHRACOTIC LYMPH NODE. - NO METASTATIC CARCINOMA IDENTIFIED. 10. Lymph node, biopsy, Level 8 Paraesophageal - ANTHRACOTIC LYMPH NODE. 1 of 3 FINAL for Wolfe, Miguel Wolfe ([id number]) Diagnosis(continued) - NO METASTATIC CARCINOMA IDENTIFIED. 11. Lymph node, biopsy, 4 Wolfe #3 node - ANTHRACOTIC LYMPH NODE. - NO METASTATIC CARCINOMA IDENTIFIED. Microscopic Comment 1. LUNG: Procedure: Left upper lobectomy and ten additional lymph node biopsies. Specimen Laterality: Left. Tumor Site: Left upper lobe. Tumor Size: 2.3 x 2.0 x 1.7 cm. Total Tumor Size Inclusive of Invasive and Lepidic Components (applies only to invasive nonmucinous adenocarcinoma with a lepidic component): 2.3 cm. Invasive Tumor Size (applies only to invasive nonmucinous adenocarcinoma with a lepidic component: 2.3 cm. Tumor Focality: Unifocal. Histologic Type: Adenocarcinoma. Visceral Pleura Invasion: No. Lymphovascular Invasion: No. Direct Invasion of Adjacent Structures: N/A. Margins: Free of tumor. Treatment Effect: No. Regional Lymph Nodes: Number of Lymph Nodes Involved: 0. Number of Lymph Nodes Examined: 13. Pathologic Stage Classification (pTNM, AJCC 8th Edition): pTic, pN0. Ancillary Studies: Foundation 1 and PDL-1 pending. Representative Tumor Block: 1E and 86F. (JDP:ah 06/25/18) (v4.0.0.3) Claudette Laws MD  Assessment / Plan:   #1 stage Ia non-small cell lung cancer left upper lobe adenocarcinoma status post left upper lobectomy and node dissection #2 groundglass opacity being followed right upper lobe-stable on CT T scan serially #3 history of incidental finding of papillary  thyroid carcinoma 2.2 cm in size involving the right lobe of the thyroid margins not involved tumor confined to the thyroid capsule resected   Medical oncology has arranged for a follow-up CT scan in January 2022 I will plan to see the patient back in late February early March 2022  Medication Changes: No orders of the defined types were placed in this encounter.    Grace Isaac 10/23/2019 11:13 AM

## 2019-10-22 ENCOUNTER — Other Ambulatory Visit: Payer: Self-pay

## 2019-10-22 ENCOUNTER — Ambulatory Visit (INDEPENDENT_AMBULATORY_CARE_PROVIDER_SITE_OTHER): Payer: Medicare Other | Admitting: Cardiothoracic Surgery

## 2019-10-22 VITALS — BP 150/85 | HR 65 | Temp 97.8°F | Resp 20 | Ht 71.0 in | Wt 162.0 lb

## 2019-10-22 DIAGNOSIS — L821 Other seborrheic keratosis: Secondary | ICD-10-CM | POA: Diagnosis not present

## 2019-10-22 DIAGNOSIS — D2272 Melanocytic nevi of left lower limb, including hip: Secondary | ICD-10-CM | POA: Diagnosis not present

## 2019-10-22 DIAGNOSIS — Z85118 Personal history of other malignant neoplasm of bronchus and lung: Secondary | ICD-10-CM

## 2019-10-22 DIAGNOSIS — L578 Other skin changes due to chronic exposure to nonionizing radiation: Secondary | ICD-10-CM | POA: Diagnosis not present

## 2019-10-22 DIAGNOSIS — L570 Actinic keratosis: Secondary | ICD-10-CM | POA: Diagnosis not present

## 2019-10-22 DIAGNOSIS — R911 Solitary pulmonary nodule: Secondary | ICD-10-CM | POA: Diagnosis not present

## 2019-10-22 DIAGNOSIS — D225 Melanocytic nevi of trunk: Secondary | ICD-10-CM | POA: Diagnosis not present

## 2019-10-22 DIAGNOSIS — Z86018 Personal history of other benign neoplasm: Secondary | ICD-10-CM | POA: Diagnosis not present

## 2019-10-22 DIAGNOSIS — Z85828 Personal history of other malignant neoplasm of skin: Secondary | ICD-10-CM | POA: Diagnosis not present

## 2019-10-27 DIAGNOSIS — R7301 Impaired fasting glucose: Secondary | ICD-10-CM | POA: Diagnosis not present

## 2019-10-27 DIAGNOSIS — Z125 Encounter for screening for malignant neoplasm of prostate: Secondary | ICD-10-CM | POA: Diagnosis not present

## 2019-10-27 DIAGNOSIS — I10 Essential (primary) hypertension: Secondary | ICD-10-CM | POA: Diagnosis not present

## 2019-10-30 DIAGNOSIS — I10 Essential (primary) hypertension: Secondary | ICD-10-CM | POA: Diagnosis not present

## 2019-10-30 DIAGNOSIS — E782 Mixed hyperlipidemia: Secondary | ICD-10-CM | POA: Diagnosis not present

## 2019-10-30 DIAGNOSIS — D229 Melanocytic nevi, unspecified: Secondary | ICD-10-CM | POA: Diagnosis not present

## 2019-10-30 DIAGNOSIS — G4700 Insomnia, unspecified: Secondary | ICD-10-CM | POA: Diagnosis not present

## 2019-10-30 DIAGNOSIS — K219 Gastro-esophageal reflux disease without esophagitis: Secondary | ICD-10-CM | POA: Diagnosis not present

## 2019-10-30 DIAGNOSIS — Z125 Encounter for screening for malignant neoplasm of prostate: Secondary | ICD-10-CM | POA: Diagnosis not present

## 2019-10-30 DIAGNOSIS — Z0000 Encounter for general adult medical examination without abnormal findings: Secondary | ICD-10-CM | POA: Diagnosis not present

## 2019-10-30 DIAGNOSIS — I2510 Atherosclerotic heart disease of native coronary artery without angina pectoris: Secondary | ICD-10-CM | POA: Diagnosis not present

## 2019-10-30 DIAGNOSIS — Z85850 Personal history of malignant neoplasm of thyroid: Secondary | ICD-10-CM | POA: Diagnosis not present

## 2019-10-30 DIAGNOSIS — R7301 Impaired fasting glucose: Secondary | ICD-10-CM | POA: Diagnosis not present

## 2019-10-30 DIAGNOSIS — Z85118 Personal history of other malignant neoplasm of bronchus and lung: Secondary | ICD-10-CM | POA: Diagnosis not present

## 2019-10-30 DIAGNOSIS — Z86010 Personal history of colonic polyps: Secondary | ICD-10-CM | POA: Diagnosis not present

## 2019-11-10 DIAGNOSIS — K130 Diseases of lips: Secondary | ICD-10-CM | POA: Diagnosis not present

## 2019-11-10 DIAGNOSIS — K146 Glossodynia: Secondary | ICD-10-CM | POA: Diagnosis not present

## 2019-11-14 ENCOUNTER — Other Ambulatory Visit: Payer: Self-pay | Admitting: Cardiology

## 2019-11-18 DIAGNOSIS — Z85118 Personal history of other malignant neoplasm of bronchus and lung: Secondary | ICD-10-CM | POA: Diagnosis not present

## 2019-11-18 DIAGNOSIS — E782 Mixed hyperlipidemia: Secondary | ICD-10-CM | POA: Diagnosis not present

## 2019-11-18 DIAGNOSIS — G4700 Insomnia, unspecified: Secondary | ICD-10-CM | POA: Diagnosis not present

## 2019-11-18 DIAGNOSIS — K219 Gastro-esophageal reflux disease without esophagitis: Secondary | ICD-10-CM | POA: Diagnosis not present

## 2019-11-18 DIAGNOSIS — G43109 Migraine with aura, not intractable, without status migrainosus: Secondary | ICD-10-CM | POA: Diagnosis not present

## 2019-11-18 DIAGNOSIS — E890 Postprocedural hypothyroidism: Secondary | ICD-10-CM | POA: Diagnosis not present

## 2019-11-18 DIAGNOSIS — I2510 Atherosclerotic heart disease of native coronary artery without angina pectoris: Secondary | ICD-10-CM | POA: Diagnosis not present

## 2019-11-18 DIAGNOSIS — I10 Essential (primary) hypertension: Secondary | ICD-10-CM | POA: Diagnosis not present

## 2019-11-23 ENCOUNTER — Ambulatory Visit (INDEPENDENT_AMBULATORY_CARE_PROVIDER_SITE_OTHER): Payer: Medicare Other | Admitting: Cardiology

## 2019-11-23 ENCOUNTER — Other Ambulatory Visit: Payer: Self-pay

## 2019-11-23 ENCOUNTER — Encounter: Payer: Self-pay | Admitting: Cardiology

## 2019-11-23 VITALS — BP 120/64 | HR 60 | Ht 71.0 in | Wt 164.0 lb

## 2019-11-23 DIAGNOSIS — E7800 Pure hypercholesterolemia, unspecified: Secondary | ICD-10-CM | POA: Diagnosis not present

## 2019-11-23 DIAGNOSIS — I10 Essential (primary) hypertension: Secondary | ICD-10-CM | POA: Diagnosis not present

## 2019-11-23 DIAGNOSIS — I351 Nonrheumatic aortic (valve) insufficiency: Secondary | ICD-10-CM

## 2019-11-23 DIAGNOSIS — I2510 Atherosclerotic heart disease of native coronary artery without angina pectoris: Secondary | ICD-10-CM | POA: Diagnosis not present

## 2019-11-23 NOTE — Progress Notes (Signed)
Cardiology Office Note:    Date:  11/23/2019   ID:  Miguel Wolfe, DOB [birth year], MRN [id number]  PCP:  [name], MD  [facility] Cardiologist:  Candee Furbish, MD  [facility] Electrophysiologist:  None   Referring MD: [name], MD     History of Present Illness:    Miguel Wolfe is a 74 y.o. male here for the follow-up coronary artery disease, prior stent placement February 2018.  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  Had left upper lung lobectomy for stage I adenocarcinoma Dr. Servando Snare.  Briefly has been with low energy.  Changes Crestor time to evening and feels better.  Walking 10,000 steps a day.  No chest discomfort.  Rare heartburn.  Received Covid booster.  Denies any bleeding fevers chills nausea vomiting syncope.  Past Medical History:  Diagnosis Date   Anginal pain ([place])    Arthritis    Colon polyps    Coronary artery disease involving native coronary artery of native heart without angina pectoris 04/23/2016   Myoview 02/08/16: EF 51, inf-lat, apical lateral scar with peri-infarct ischemia; intermediate risk  // LHC 2/18: dLAD 20, oD1 20, pLCx 20, pRCA 80, mRCA 20, dRCA 30, AM 90 >> PCI: 4 x 24 mm Synergy DES to pRCA    Family history of adverse reaction to anesthesia    Mom vomits and Dad went "nuts"   Family history of colon cancer    Family history of stomach cancer    Fatigue    GERD (gastroesophageal reflux disease)    Hemorrhoids    History of echocardiogram    Echo 02/08/16: Mild LVH, EF 55-60, no RWMA, Gr 1 DD, trivial AI, mild LAE   Hyperlipidemia    Hypertension    PONV (postoperative nausea and vomiting)    Pre-diabetes    Pulmonary nodules     Past Surgical History:  Procedure Laterality Date   CARDIAC CATHETERIZATION     COLONOSCOPY WITH ESOPHAGOGASTRODUODENOSCOPY (EGD)  2008   CORONARY STENT INTERVENTION N/A 02/20/2016   Procedure: Coronary Stent  Intervention;  Surgeon: Burnell Blanks, MD;  Location: [place] [facility];  Service: Cardiovascular;  Laterality: N/A;   ESOPHAGOGASTRODUODENOSCOPY     LEFT HEART CATH AND CORONARY ANGIOGRAPHY N/A 02/20/2016   Procedure: Left Heart Cath and Coronary Angiography;  Surgeon: Burnell Blanks, MD;  Location: [facility];  Service: Cardiovascular;  Laterality: N/A;   PILONIDAL CYCT     REFRACTIVE SURGERY     retina tear repair   THYROIDECTOMY N/A 01/17/2018   Procedure: TOTAL THYROIDECTOMY;  Surgeon: Armandina Gemma, MD;  Location: [facility];  Service: General;  Laterality: N/A;   VIDEO ASSISTED THORACOSCOPY (VATS)/WEDGE RESECTION Left 06/24/2018   Procedure: VIDEO ASSISTED THORACOSCOPY (VATS)/LUNG RESECTION;  Surgeon: Grace Isaac, MD;  Location: [place];  Service: Thoracic;  Laterality: Left;   VIDEO BRONCHOSCOPY WITH ENDOBRONCHIAL NAVIGATION N/A 11/18/2017   Procedure: VIDEO BRONCHOSCOPY WITH ENDOBRONCHIAL NAVIGATION WITH BIOPSIES OF LEFT AND RIGHT UPPER LOBES;  Surgeon: Grace Isaac, MD;  Location: [place];  Service: Thoracic;  Laterality: N/A;   VIDEO BRONCHOSCOPY WITH ENDOBRONCHIAL NAVIGATION N/A 06/11/2018   Procedure: VIDEO BRONCHOSCOPY WITH ENDOBRONCHIAL NAVIGATION;  Surgeon: Grace Isaac, MD;  Location: [place];  Service: Thoracic;  Laterality: N/A;    Current Medications: Current Meds  Medication Sig   acetaminophen (TYLENOL) 325 MG tablet Take 650 mg by mouth every 6 (six) hours as needed  for moderate pain or headache.    amLODipine (NORVASC) 10 MG tablet Take 10 mg by mouth daily.   aspirin EC 81 MG tablet Take 81 mg by mouth daily.   famotidine (PEPCID) 20 MG tablet Take 20 mg by mouth daily.   ibuprofen (ADVIL,MOTRIN) 200 MG tablet Take 400 mg by mouth every 8 (eight) hours as needed for headache or moderate pain.    levothyroxine (SYNTHROID) 150 MCG tablet Take 150 mcg by mouth daily before breakfast.    losartan (COZAAR) 100 MG tablet Take  100 mg by mouth daily.   NASACORT ALLERGY 24HR 55 MCG/ACT AERO nasal inhaler SMARTSIG:2 Spray(s) Both Nares Every Night   nitroGLYCERIN (NITROSTAT) 0.4 MG SL tablet Place 1 tablet (0.4 mg total) under the tongue every 5 (five) minutes as needed for chest pain.   omega-3 acid ethyl esters (LOVAZA) 1 g capsule Take 2 g by mouth 2 (two) times daily.   propranolol (INDERAL) 20 MG tablet Take 20 mg by mouth daily.   rosuvastatin (CRESTOR) 20 MG tablet Take 1 tablet (20 mg total) by mouth daily. Please keep upcoming appt in November with Dr. Marlou Porch before anymore refills. Thank you   zolpidem (AMBIEN) 10 MG tablet Take 10 mg by mouth at bedtime as needed for sleep.      Allergies:   Gemfibrozil, Other, Propranolol, Niaspan [niacin er], and Simvastatin   Social History   Socioeconomic History   Marital status: Married    Spouse name: Not on file   Number of children: Not on file   Years of education: Not on file   Highest education level: Not on file  Occupational History   Not on file  Tobacco Use   Smoking status: Former Smoker    Packs/day: 1.00    Years: 3.00    Pack years: 3.00    Types: Cigarettes    Quit date: 02/01/1967    Years since quitting: 52.8   Smokeless tobacco: Never Used  Vaping Use   Vaping Use: Never used  Substance and Sexual Activity   Alcohol use: Yes    Comment: a drink before dinner a few times a week   Drug use: No   Sexual activity: Not on file  Other Topics Concern   Not on file  Social History Narrative   Not on file   Social Determinants of Health   Financial Resource Strain:    Difficulty of Paying Living Expenses: Not on file  Food Insecurity:    Worried About Charity fundraiser in the Last Year: Not on file   [employer] of Food in the Last Year: Not on file  Transportation Needs:    Lack of Transportation (Medical): Not on file   Lack of Transportation (Non-Medical): Not on file  Physical Activity:    Days of  Exercise per Week: Not on file   Minutes of Exercise per Session: Not on file  Stress:    Feeling of Stress : Not on file  Social Connections:    Frequency of Communication with Friends and Family: Not on file   Frequency of Social Gatherings with Friends and Family: Not on file   Attends Religious Services: Not on file   Active Member of Clubs or Organizations: Not on file   Attends Archivist Meetings: Not on file   Marital Status: Not on file     Family History: The patient's family history includes Cancer in his paternal uncle and paternal uncle; Colon cancer (  age of onset: 34) in his cousin; Crohn's disease in his sister; Healthy in his sister; Heart attack (age of onset: 23) in his father; Stomach cancer in his paternal uncle; Throat cancer in his paternal uncle; Thyroid cancer in his daughter.  ROS:   Please see the history of present illness.    No fevers chills nausea vomiting syncope bleeding all other systems reviewed and are negative.  EKGs/Labs/Other Studies Reviewed:    The following studies were reviewed today:  ECHO 2021:  1. Left ventricular ejection fraction, by estimation, is 50 to 55%. The  left ventricle has low normal function. The left ventricle has no regional  wall motion abnormalities. Left ventricular diastolic parameters are  indeterminate. The average left  ventricular global longitudinal strain is -16.5 %.  2. Right ventricular systolic function is normal. The right ventricular  size is normal. There is normal pulmonary artery systolic pressure.  3. Left atrial size was mildly dilated.  4. The mitral valve is normal in structure and function. Mild mitral  valve regurgitation. No evidence of mitral stenosis.  5. The aortic valve is tricuspid. Aortic valve regurgitation is moderate.  No aortic stenosis is present. Aortic regurgitation PHT measures 493 msec.  6. Aortic dilatation noted. There is mild dilatation of the ascending   aorta measuring 38 mm.  7. The inferior vena cava is normal in size with greater than 50%  respiratory variability, suggesting right atrial pressure of 3 mmHg.   NUC stress 2019:  Nuclear stress EF: 55%.  Blood pressure demonstrated a normal response to exercise.  There was no ST segment deviation noted during stress.  Defect 1: There is a small defect of mild severity present in the apical inferior and apical lateral location.  Findings consistent with mild ischemia.  The left ventricular ejection fraction is normal (55-65%).     EKG:  EKG is  ordered today.  The ekg ordered today demonstrates sinus rhythm 60 artifact  Recent Labs: 08/03/2019: ALT 14; BUN 13; Creatinine 0.90; Hemoglobin 15.0; Platelet Count 160; Potassium 3.9; Sodium 142  Recent Lipid Panel    Component Value Date/Time   CHOL 151 01/21/2017 0840   TRIG 177 (H) 01/21/2017 0840   HDL 48 01/21/2017 0840   CHOLHDL 3.1 01/21/2017 0840   LDLCALC 68 01/21/2017 0840     Risk Assessment/Calculations:       Physical Exam:    VS:  BP 120/64    Pulse 60    Ht 5\' 11"  (1.803 m)    Wt 164 lb (74.4 kg)    SpO2 99%    BMI 22.87 kg/m     Wt Readings from Last 3 Encounters:  11/23/19 164 lb (74.4 kg)  10/22/19 162 lb (73.5 kg)  08/04/19 164 lb 4.8 oz (74.5 kg)     GEN:  Well nourished, well developed in no acute distress HEENT: Normal NECK: No JVD; No carotid bruits LYMPHATICS: No lymphadenopathy CARDIAC: RRR, no murmurs, rubs, gallops RESPIRATORY:  Clear to auscultation without rales, wheezing or rhonchi  ABDOMEN: Soft, non-tender, non-distended MUSCULOSKELETAL:  No edema; No deformity  SKIN: Warm and dry NEUROLOGIC:  Alert and oriented x 3 PSYCHIATRIC:  Normal affect   ASSESSMENT:    1. Coronary artery disease involving native coronary artery of native heart without angina pectoris   2. Essential hypertension   3. Nonrheumatic aortic valve insufficiency   4. Pure hypercholesterolemia    PLAN:     In order of problems listed above:  CAD  with angina -RCA stent 2018 after inferolateral MI with peri-infarct ischemia. -Continue with risk factor modification, aspirin, statin.  Blood pressure control. -Low normal EF. -Occasional atypical chest discomfort, heartburn-like.  Nonexertional.  Continue to monitor.  Moderate aortic valve regurgitation -Continue to monitor with echocardiogram.  See above.  No significant shortness of breath.  Stage I adenocarcinoma of the lung post resection -Dr. Servando Snare.  Doing well.  Hyperlipidemia -Prior myalgias with statin such as pravastatin.  Crestor seem to do a good job. Crestor at night now. Made a difference. Energy low.  Seems to have been improved with change in dosing time.  Labs from outside demonstrate LDL of 50 HDL of 37 triglycerides of 90.  Hemoglobin 15.6 potassium 4.3      Shared Decision Making/Informed Consent        Medication Adjustments/Labs and Tests Ordered: Current medicines are reviewed at length with the patient today.  Concerns regarding medicines are outlined above.  Orders Placed This Encounter  Procedures   EKG 12-Lead   ECHOCARDIOGRAM COMPLETE   No orders of the defined types were placed in this encounter.   Patient Instructions  Medication Instructions:  The current medical regimen is effective;  continue present plan and medications.  *If you need a refill on your cardiac medications before your next appointment, please call your pharmacy*  Testing/Procedures: Your physician has requested that you have an echocardiogram in 6 months (before seeing Dr Marlou Porch back in clinic). Echocardiography is a painless test that uses sound waves to create images of your heart. It provides your doctor with information about the size and shape of your heart and how well your hearts chambers and valves are working. This procedure takes approximately one hour. There are no restrictions for this  procedure.  Follow-Up: At [facility], you and your health needs are our priority.  As part of our continuing mission to provide you with exceptional heart care, we have created designated Provider Care Teams.  These Care Teams include your primary Cardiologist (physician) and Advanced Practice Providers (APPs -  Physician Assistants and Nurse Practitioners) who all work together to provide you with the care you need, when you need it.  We recommend signing up for the patient portal called "MyChart".  Sign up information is provided on this After Visit Summary.  MyChart is used to connect with patients for Virtual Visits (Telemedicine).  Patients are able to view lab/test results, encounter notes, upcoming appointments, etc.  Non-urgent messages can be sent to your provider as well.   To learn more about what you can do with MyChart, go to [email or web address].    Your next appointment:   6 month(s)  The format for your next appointment:   In Person  Provider:   Candee Furbish, MD   Thank you for choosing [facility]!!        Signed, Candee Furbish, MD  11/23/2019 9:35 AM    [place]

## 2019-11-23 NOTE — Patient Instructions (Signed)
Medication Instructions:  The current medical regimen is effective;  continue present plan and medications.  *If you need a refill on your cardiac medications before your next appointment, please call your pharmacy*  Testing/Procedures: Your physician has requested that you have an echocardiogram in 6 months (before seeing Dr Marlou Porch back in clinic). Echocardiography is a painless test that uses sound waves to create images of your heart. It provides your doctor with information about the size and shape of your heart and how well your heart's chambers and valves are working. This procedure takes approximately one hour. There are no restrictions for this procedure.  Follow-Up: At [facility], you and your health needs are our priority.  As part of our continuing mission to provide you with exceptional heart care, we have created designated Provider Care Teams.  These Care Teams include your primary Cardiologist (physician) and Advanced Practice Providers (APPs -  Physician Assistants and Nurse Practitioners) who all work together to provide you with the care you need, when you need it.  We recommend signing up for the patient portal called "MyChart".  Sign up information is provided on this After Visit Summary.  MyChart is used to connect with patients for Virtual Visits (Telemedicine).  Patients are able to view lab/test results, encounter notes, upcoming appointments, etc.  Non-urgent messages can be sent to your provider as well.   To learn more about what you can do with MyChart, go to [email or web address].    Your next appointment:   6 month(s)  The format for your next appointment:   In Person  Provider:   Candee Furbish, MD   Thank you for choosing [facility]!!

## 2019-12-05 IMAGING — CT NM PET TUM IMG INITIAL (PI) SKULL BASE T - THIGH
1 of 7 series · 1 of 25 positions shown · non-contrast
Comparison: Chest CT 10/23/2017

CLINICAL DATA: Initial treatment strategy for lung nodule.

EXAM:
NUCLEAR MEDICINE PET SKULL BASE TO THIGH
TECHNIQUE: 9.7 mCi F-18 FDG was injected intravenously. Full-ring PET imaging
was performed from the skull base to thigh after the radiotracer. CT
data was obtained and used for attenuation correction and anatomic
localization.
Fasting blood glucose: 104 mg/dl

[Series 4: ct sk_thigh 5.0 b31f · axial · 5.0mm · 0.98mm/px · 1 of 247 slices shown]
[im 247/247  brain]
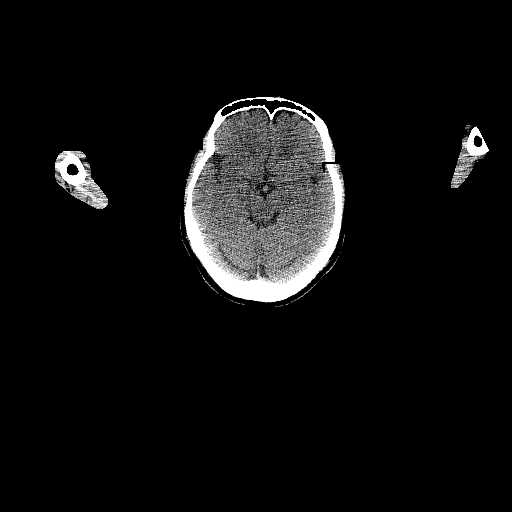

[1 of 25 positions shown; findings below may reference images not displayed]

FINDINGS: Mediastinal blood pool activity: SUV max

NECK: No hypermetabolic lymph nodes in the neck. Bilateral thyroid
nodules are evident with hypermetabolism ( SUV max = 7.1) of the 16
mm right thyroid nodule.

Incidental CT findings: none

CHEST: The bilateral mixed attenuation pulmonary nodules are again
identified.

1 of the more dominant nodules is in the central left upper lobe. On
the prior study, a left upper lobe nodule with 14 mm solid component
was identified. This is seen on axial image 15 of series 8 today.
Low level FDG uptake is present within this nodule ( SUV max = 1.8.

Apical 10 mm ground-glass nodule identified on the prior study is
visible on [DATE] today. This also shows low level FDG uptake with
SUV max = 1.3.

The round peripheral 10 mm right lower lobe nodule identified on the
prior study has decreased to 8 mm in the interval (39/8) with SUV
max = 0.9 today.

Additional tiny scattered ground-glass nodules in both lung show no
discernible FDG uptake on today's PET images.

Incidental CT findings: Coronary artery calcification is evident.
Atherosclerotic calcification is noted in the wall of the thoracic
aorta.There is compressive atelectasis in the lower lungs
bilaterally.

ABDOMEN/PELVIS: No abnormal hypermetabolic activity within the
liver, pancreas, adrenal glands, or spleen. No hypermetabolic lymph
nodes in the abdomen or pelvis.

Incidental CT findings: Prostate gland is enlarged. There is
abdominal aortic atherosclerosis without aneurysm.

SKELETON: No focal hypermetabolic activity to suggest skeletal
metastasis.

Incidental CT findings: none
IMPRESSION: 1. Bilateral mixed attenuation pulmonary nodule show no substantial
change since 10/23/2017 although the solid 10 mm right lower lobe
nodule seen previously is now 8 mm. The dominant lesions show low
level FDG uptake as outlined above and may reflect
infectious/inflammatory etiology although low-grade or well
differentiated neoplasm cannot be excluded.
2. 16 mm hypermetabolic right thyroid nodule. Thyroid ultrasound
recommended to further evaluate.
3.  Aortic Atherosclerois ([id number]-170.0)
4.

## 2020-01-05 ENCOUNTER — Other Ambulatory Visit: Payer: Self-pay | Admitting: Cardiology

## 2020-01-06 DIAGNOSIS — G4700 Insomnia, unspecified: Secondary | ICD-10-CM | POA: Diagnosis not present

## 2020-01-06 DIAGNOSIS — I2510 Atherosclerotic heart disease of native coronary artery without angina pectoris: Secondary | ICD-10-CM | POA: Diagnosis not present

## 2020-01-06 DIAGNOSIS — I10 Essential (primary) hypertension: Secondary | ICD-10-CM | POA: Diagnosis not present

## 2020-01-06 DIAGNOSIS — G43109 Migraine with aura, not intractable, without status migrainosus: Secondary | ICD-10-CM | POA: Diagnosis not present

## 2020-01-06 DIAGNOSIS — Z85118 Personal history of other malignant neoplasm of bronchus and lung: Secondary | ICD-10-CM | POA: Diagnosis not present

## 2020-01-06 DIAGNOSIS — K219 Gastro-esophageal reflux disease without esophagitis: Secondary | ICD-10-CM | POA: Diagnosis not present

## 2020-01-06 DIAGNOSIS — E890 Postprocedural hypothyroidism: Secondary | ICD-10-CM | POA: Diagnosis not present

## 2020-01-06 DIAGNOSIS — E782 Mixed hyperlipidemia: Secondary | ICD-10-CM | POA: Diagnosis not present

## 2020-02-02 ENCOUNTER — Other Ambulatory Visit: Payer: Self-pay

## 2020-02-02 ENCOUNTER — Encounter (HOSPITAL_COMMUNITY): Payer: Self-pay

## 2020-02-02 ENCOUNTER — Inpatient Hospital Stay: Payer: Medicare Other | Attending: Internal Medicine

## 2020-02-02 ENCOUNTER — Ambulatory Visit (HOSPITAL_COMMUNITY)
Admission: RE | Admit: 2020-02-02 | Discharge: 2020-02-02 | Disposition: A | Discharge status: Home / Self Care | Payer: Medicare Other | Source: Ambulatory Visit | Attending: Internal Medicine | Admitting: Internal Medicine

## 2020-02-02 DIAGNOSIS — I2510 Atherosclerotic heart disease of native coronary artery without angina pectoris: Secondary | ICD-10-CM | POA: Diagnosis not present

## 2020-02-02 DIAGNOSIS — E785 Hyperlipidemia, unspecified: Secondary | ICD-10-CM | POA: Insufficient documentation

## 2020-02-02 DIAGNOSIS — Z800 Family history of malignant neoplasm of digestive organs: Secondary | ICD-10-CM | POA: Insufficient documentation

## 2020-02-02 DIAGNOSIS — I10 Essential (primary) hypertension: Secondary | ICD-10-CM | POA: Insufficient documentation

## 2020-02-02 DIAGNOSIS — C3490 Malignant neoplasm of unspecified part of unspecified bronchus or lung: Secondary | ICD-10-CM | POA: Insufficient documentation

## 2020-02-02 DIAGNOSIS — K219 Gastro-esophageal reflux disease without esophagitis: Secondary | ICD-10-CM | POA: Insufficient documentation

## 2020-02-02 DIAGNOSIS — Z7982 Long term (current) use of aspirin: Secondary | ICD-10-CM | POA: Insufficient documentation

## 2020-02-02 DIAGNOSIS — C3412 Malignant neoplasm of upper lobe, left bronchus or lung: Secondary | ICD-10-CM | POA: Insufficient documentation

## 2020-02-02 DIAGNOSIS — Z79899 Other long term (current) drug therapy: Secondary | ICD-10-CM | POA: Insufficient documentation

## 2020-02-02 DIAGNOSIS — Z902 Acquired absence of lung [part of]: Secondary | ICD-10-CM | POA: Insufficient documentation

## 2020-02-02 DIAGNOSIS — E890 Postprocedural hypothyroidism: Secondary | ICD-10-CM | POA: Insufficient documentation

## 2020-02-02 DIAGNOSIS — J984 Other disorders of lung: Secondary | ICD-10-CM | POA: Diagnosis not present

## 2020-02-02 DIAGNOSIS — C7951 Secondary malignant neoplasm of bone: Secondary | ICD-10-CM | POA: Diagnosis not present

## 2020-02-02 LAB — CMP (CANCER CENTER ONLY)
ALT: 17 U/L (ref 0–44)
AST: 22 U/L (ref 15–41)
Albumin: 4.6 g/dL (ref 3.5–5.0)
Alkaline Phosphatase: 70 U/L (ref 38–126)
Anion gap: 9 (ref 5–15)
BUN: 19 mg/dL (ref 8–23)
CO2: 30 mmol/L (ref 22–32)
Calcium: 9 mg/dL (ref 8.9–10.3)
Chloride: 105 mmol/L (ref 98–111)
Creatinine: 0.84 mg/dL (ref 0.61–1.24)
GFR, Estimated: >60 mL/min (ref >60)
Glucose, Bld: 95 mg/dL (ref 70–99)
Potassium: 4.1 mmol/L (ref 3.5–5.1)
Sodium: 144 mmol/L (ref 135–145)
Total Bilirubin: 0.9 mg/dL (ref 0.3–1.2)
Total Protein: 7.5 g/dL (ref 6.5–8.1)

## 2020-02-02 LAB — CBC WITH DIFFERENTIAL (CANCER CENTER ONLY)
Abs Immature Granulocytes: 0.01 10*3/uL (ref 0.00–0.07)
Basophils Absolute: 0 10*3/uL (ref 0.0–0.1)
Basophils Relative: 1 %
Eosinophils Absolute: 0.4 10*3/uL (ref 0.0–0.5)
Eosinophils Relative: 5 %
HCT: 46.3 % (ref 39.0–52.0)
Hemoglobin: 15.4 g/dL (ref 13.0–17.0)
Immature Granulocytes: 0 %
Lymphocytes Relative: 31 %
Lymphs Abs: 2.2 10*3/uL (ref 0.7–4.0)
MCH: 30.1 pg (ref 26.0–34.0)
MCHC: 33.3 g/dL (ref 30.0–36.0)
MCV: 90.6 fL (ref 80.0–100.0)
Monocytes Absolute: 0.8 10*3/uL (ref 0.1–1.0)
Monocytes Relative: 12 %
Neutro Abs: 3.6 10*3/uL (ref 1.7–7.7)
Neutrophils Relative %: 51 %
Platelet Count: 176 10*3/uL (ref 150–400)
RBC: 5.11 MIL/uL (ref 4.22–5.81)
RDW: 12.6 % (ref 11.5–15.5)
WBC Count: 7 10*3/uL (ref 4.0–10.5)
nRBC: 0 % (ref 0.0–0.2)

## 2020-02-02 MED ORDER — IOHEXOL 300 MG/ML  SOLN
75.0000 mL | Freq: Once | INTRAMUSCULAR | Status: AC | PRN
  Administered 2020-02-02: 75 mL via INTRAVENOUS

## 2020-02-03 ENCOUNTER — Telehealth: Payer: Self-pay | Admitting: Internal Medicine

## 2020-02-03 ENCOUNTER — Other Ambulatory Visit: Payer: Self-pay

## 2020-02-03 ENCOUNTER — Inpatient Hospital Stay (HOSPITAL_BASED_OUTPATIENT_CLINIC_OR_DEPARTMENT_OTHER): Payer: Medicare Other | Admitting: Internal Medicine

## 2020-02-03 VITALS — BP 136/77 | HR 73 | Temp 97.0°F | Resp 16 | Ht 71.0 in | Wt 170.6 lb

## 2020-02-03 DIAGNOSIS — C3410 Malignant neoplasm of upper lobe, unspecified bronchus or lung: Secondary | ICD-10-CM

## 2020-02-03 DIAGNOSIS — Z800 Family history of malignant neoplasm of digestive organs: Secondary | ICD-10-CM | POA: Diagnosis not present

## 2020-02-03 DIAGNOSIS — E785 Hyperlipidemia, unspecified: Secondary | ICD-10-CM | POA: Diagnosis not present

## 2020-02-03 DIAGNOSIS — E890 Postprocedural hypothyroidism: Secondary | ICD-10-CM | POA: Diagnosis not present

## 2020-02-03 DIAGNOSIS — C3412 Malignant neoplasm of upper lobe, left bronchus or lung: Secondary | ICD-10-CM | POA: Diagnosis not present

## 2020-02-03 DIAGNOSIS — Z7982 Long term (current) use of aspirin: Secondary | ICD-10-CM | POA: Diagnosis not present

## 2020-02-03 DIAGNOSIS — I10 Essential (primary) hypertension: Secondary | ICD-10-CM

## 2020-02-03 DIAGNOSIS — I2510 Atherosclerotic heart disease of native coronary artery without angina pectoris: Secondary | ICD-10-CM | POA: Diagnosis not present

## 2020-02-03 DIAGNOSIS — Z902 Acquired absence of lung [part of]: Secondary | ICD-10-CM | POA: Diagnosis not present

## 2020-02-03 DIAGNOSIS — K219 Gastro-esophageal reflux disease without esophagitis: Secondary | ICD-10-CM | POA: Diagnosis not present

## 2020-02-03 DIAGNOSIS — Z79899 Other long term (current) drug therapy: Secondary | ICD-10-CM | POA: Diagnosis not present

## 2020-02-03 NOTE — Telephone Encounter (Signed)
Scheduled follow-up appointment per 1/26 los. Patient is aware.

## 2020-02-03 NOTE — Progress Notes (Signed)
[facility] Telephone:(336) [phone]   [phone]  OFFICE PROGRESS NOTE  [name], MD [place] [address]  DIAGNOSIS: Stage IA (T1c, N0, M0) non-small cell lung cancer, adenocarcinoma with no actionable mutation and negative PDL 1 expression diagnosed in June 2020.   PRIOR THERAPY: Status post left upper lobectomy with lymph node dissection on June 24, 2018.  CURRENT THERAPY: Observation.  INTERVAL HISTORY: Miguel Wolfe 75 y.o. male returns to the clinic today for follow-up visit and his wife was available by phone during the visit.  The patient is feeling fine today with no concerning complaints except for low back pain started few weeks ago.  He denied having any current chest pain, shortness of breath, cough or hemoptysis.  He denied having any fever or chills.  He has no nausea, vomiting, diarrhea or constipation.  He has no headache or visual changes.  He denied having any recent weight loss or night sweats.  The patient has repeat CT scan of the chest performed recently and he is here for evaluation and discussion of his discuss results.  MEDICAL HISTORY: Past Medical History:  Diagnosis Date  . Anginal pain ([place])   . Arthritis   . Colon polyps   . Coronary artery disease involving native coronary artery of native heart without angina pectoris 04/23/2016   Myoview 02/08/16: EF 51, inf-lat, apical lateral scar with peri-infarct ischemia; intermediate risk  // LHC 2/18: dLAD 20, oD1 20, pLCx 20, pRCA 80, mRCA 20, dRCA 30, AM 90 >> PCI: 4 x 24 mm Synergy DES to pRCA   . Family history of adverse reaction to anesthesia    Mom vomits and Dad went "nuts"  . Family history of colon cancer   . Family history of stomach cancer   . Fatigue   . GERD (gastroesophageal reflux disease)   . Hemorrhoids   . History of echocardiogram    Echo 02/08/16: Mild LVH, EF 55-60, no RWMA, Gr 1 DD, trivial AI, mild LAE  . Hyperlipidemia   .  Hypertension   . PONV (postoperative nausea and vomiting)   . Pre-diabetes   . Pulmonary nodules     ALLERGIES:  is allergic to gemfibrozil, other, propranolol, niaspan [niacin er], and simvastatin.  MEDICATIONS:  Current Outpatient Medications  Medication Sig Dispense Refill  . acetaminophen (TYLENOL) 325 MG tablet Take 650 mg by mouth every 6 (six) hours as needed for moderate pain or headache.     Marland Kitchen amLODipine (NORVASC) 10 MG tablet Take 10 mg by mouth daily.    Marland Kitchen aspirin EC 81 MG tablet Take 81 mg by mouth daily.    . famotidine (PEPCID) 20 MG tablet Take 20 mg by mouth daily.    Marland Kitchen ibuprofen (ADVIL,MOTRIN) 200 MG tablet Take 400 mg by mouth every 8 (eight) hours as needed for headache or moderate pain.     Marland Kitchen levothyroxine (SYNTHROID) 150 MCG tablet Take 150 mcg by mouth daily before breakfast.     . losartan (COZAAR) 100 MG tablet Take 100 mg by mouth daily.    Marland Kitchen NASACORT ALLERGY 24HR 55 MCG/ACT AERO nasal inhaler SMARTSIG:2 Spray(s) Both Nares Every Night    . nitroGLYCERIN (NITROSTAT) 0.4 MG SL tablet Place 1 tablet (0.4 mg total) under the tongue every 5 (five) minutes as needed for chest pain. 25 tablet 12  . omega-3 acid ethyl esters (LOVAZA) 1 g capsule Take 2 g by mouth 2 (two) times  daily.    . propranolol (INDERAL) 20 MG tablet Take 20 mg by mouth daily.  2  . rosuvastatin (CRESTOR) 20 MG tablet Take 1 tablet (20 mg total) by mouth daily. Please keep upcoming appt in November with Dr. Marlou Porch before anymore refills. Thank you 90 tablet 0  . zolpidem (AMBIEN) 10 MG tablet Take 10 mg by mouth at bedtime as needed for sleep.   0   No current facility-administered medications for this visit.    SURGICAL HISTORY:  Past Surgical History:  Procedure Laterality Date  . CARDIAC CATHETERIZATION    . COLONOSCOPY WITH ESOPHAGOGASTRODUODENOSCOPY (EGD)  2008  . CORONARY STENT INTERVENTION N/A 02/20/2016   Procedure: Coronary Stent Intervention;  Surgeon: Burnell Blanks, MD;   Location: [facility];  Service: Cardiovascular;  Laterality: N/A;  . ESOPHAGOGASTRODUODENOSCOPY    . LEFT HEART CATH AND CORONARY ANGIOGRAPHY N/A 02/20/2016   Procedure: Left Heart Cath and Coronary Angiography;  Surgeon: Burnell Blanks, MD;  Location: [facility];  Service: Cardiovascular;  Laterality: N/A;  . PILONIDAL CYCT    . REFRACTIVE SURGERY     retina tear repair  . THYROIDECTOMY N/A 01/17/2018   Procedure: TOTAL THYROIDECTOMY;  Surgeon: Armandina Gemma, MD;  Location: [facility];  Service: General;  Laterality: N/A;  . VIDEO ASSISTED THORACOSCOPY (VATS)/WEDGE RESECTION Left 06/24/2018   Procedure: VIDEO ASSISTED THORACOSCOPY (VATS)/LUNG RESECTION;  Surgeon: Grace Isaac, MD;  Location: [facility];  Service: Thoracic;  Laterality: Left;  Marland Kitchen VIDEO BRONCHOSCOPY WITH ENDOBRONCHIAL NAVIGATION N/A 11/18/2017   Procedure: VIDEO BRONCHOSCOPY WITH ENDOBRONCHIAL NAVIGATION WITH BIOPSIES OF LEFT AND RIGHT UPPER LOBES;  Surgeon: Grace Isaac, MD;  Location: [facility];  Service: Thoracic;  Laterality: N/A;  . VIDEO BRONCHOSCOPY WITH ENDOBRONCHIAL NAVIGATION N/A 06/11/2018   Procedure: VIDEO BRONCHOSCOPY WITH ENDOBRONCHIAL NAVIGATION;  Surgeon: Grace Isaac, MD;  Location: [facility];  Service: Thoracic;  Laterality: N/A;    REVIEW OF SYSTEMS:  Constitutional: negative Eyes: negative Ears, nose, mouth, throat, and face: negative Respiratory: negative Cardiovascular: negative Gastrointestinal: negative Genitourinary:negative Integument/breast: negative Hematologic/lymphatic: negative Musculoskeletal:positive for back pain Neurological: negative Behavioral/Psych: negative Endocrine: negative Allergic/Immunologic: negative   PHYSICAL EXAMINATION: General appearance: alert, cooperative and no distress Head: Normocephalic, without obvious abnormality, atraumatic Neck: no adenopathy, no JVD, supple, symmetrical, trachea midline and thyroid not enlarged, symmetric, no  tenderness/mass/nodules Lymph nodes: Cervical, supraclavicular, and axillary nodes normal. Resp: clear to auscultation bilaterally Back: symmetric, no curvature. ROM normal. No CVA tenderness. Cardio: regular rate and rhythm, S1, S2 normal, no murmur, click, rub or gallop GI: soft, non-tender; bowel sounds normal; no masses,  no organomegaly Extremities: extremities normal, atraumatic, no cyanosis or edema Neurologic: Alert and oriented X 3, normal strength and tone. Normal symmetric reflexes. Normal coordination and gait  ECOG PERFORMANCE STATUS: 1 - Symptomatic but completely ambulatory  Blood pressure 136/77, pulse 73, temperature (!) 97 F (36.1 C), temperature source Tympanic, resp. rate 16, height 5\' 11"  (1.803 m), weight 170 lb 9.6 oz (77.4 kg), SpO2 99 %.  LABORATORY DATA: Lab Results  Component Value Date   WBC 7.0 02/02/2020   HGB 15.4 02/02/2020   HCT 46.3 02/02/2020   MCV 90.6 02/02/2020   PLT 176 02/02/2020      Chemistry      Component Value Date/Time   NA 144 02/02/2020 1016   NA 143 04/24/2016 0935   K 4.1 02/02/2020 1016   CL 105 02/02/2020 1016   CO2 30 02/02/2020 1016  BUN 19 02/02/2020 1016   BUN 17 04/24/2016 0935   CREATININE 0.84 02/02/2020 1016      Component Value Date/Time   CALCIUM 9.0 02/02/2020 1016   ALKPHOS 70 02/02/2020 1016   AST 22 02/02/2020 1016   ALT 17 02/02/2020 1016   BILITOT 0.9 02/02/2020 1016       RADIOGRAPHIC STUDIES: CT Chest W Contrast  Result Date: 02/02/2020 CLINICAL DATA:  Restaging non-small cell lung cancer. History of partial left lung resection 06/24/2018. EXAM: CT CHEST WITH CONTRAST TECHNIQUE: Multidetector CT imaging of the chest was performed during intravenous contrast administration. CONTRAST:  53mL OMNIPAQUE IOHEXOL 300 MG/ML  SOLN COMPARISON:  CT chest 08/03/2019 and 02/02/2019. FINDINGS: Cardiovascular: No acute vascular findings. There is atherosclerosis of the aorta, great vessels and coronary arteries.  The heart size is normal. There is no pericardial effusion. Mediastinum/Nodes: There are no enlarged mediastinal, hilar or axillary lymph nodes.Status post thyroidectomy. The esophagus and trachea appear unremarkable. Lungs/Pleura: Stable postsurgical changes from previous left upper lobe resection. The previously demonstrated left-sided pneumothorax has resolved. There is no pleural effusion. There is a stable ground-glass density at the right lung apex with internal linear components. No developing soft tissue components, new or enlarging pulmonary nodules. Upper abdomen: Right paraspinal/retrocrural soft tissue nodule at L1 measuring 2.6 x 1.6 cm on image 162/2 is unchanged. This was not hypermetabolic on prior PET-CT and is likely an incidental nerve sheath lesion. The visualized upper abdomen appears stable without significant findings. Musculoskeletal/Chest wall: There is a lucent lesion with sclerotic margins posteriorly in the L1 vertebral body which has mildly enlarged compared with the prior studies. This measures approximately 1.9 x 1.4 x 1.7 cm and is indeterminate based on interval enlargement. No other focal osseous lesions are identified. IMPRESSION: 1. Indeterminate lucent lesion with sclerotic margins posteriorly in the L1 vertebral body has mildly enlarged compared with prior studies and is potentially a metastasis. Consider further evaluation with lumbar MRI without and with contrast. 2. Otherwise stable chest CT without additional evidence metastatic disease or local recurrence post left upper lobe resection. Interval resolution of previously demonstrated small left-sided pneumothorax. 3. Stable right apical ground-glass density for which continued follow-up recommended. 4. Aortic Atherosclerosis (ICD10-I70.0). Electronically Signed   By: Richardean Sale M.D.   On: 02/02/2020 13:51    ASSESSMENT AND PLAN: This is a very pleasant 76 years old white male with stage Ia non-small cell lung cancer,  adenocarcinoma status post left upper lobectomy with lymph node dissection under the care of Dr. Servando Snare in June 2020. The patient has been on observation since that time and he is feeling fine except for the recent low back pain. He had repeat CT scan of the chest performed recently for restaging of his disease.  The scan showed indeterminate lucent lesion with sclerotic margins posteriorly in the L1 vertebral body that mildly enlarged compared to the prior studies and suspicious for a metastasis. I personally and independently reviewed the scans and discussed the result with the patient and provided him with an image of the suspicious lesion. I recommended for him to have MRI of the lumbar spine for further evaluation of this lesion and to rule out disease metastasis or any other etiology for this lesion. The patient will come back for follow-up visit in around 2 weeks for evaluation and discussion of the MRI results and further recommendation regarding his condition. He was advised to call immediately if he has any other concerning symptoms in the interval. The  patient voices understanding of current disease status and treatment options and is in agreement with the current care plan.  All questions were answered. The patient knows to call the clinic with any problems, questions or concerns. We can certainly see the patient much sooner if necessary.   Disclaimer: This note was dictated with voice recognition software. Similar sounding words can inadvertently be transcribed and may not be corrected upon review.

## 2020-02-07 ENCOUNTER — Other Ambulatory Visit: Payer: Self-pay | Admitting: Cardiology

## 2020-02-13 ENCOUNTER — Other Ambulatory Visit: Payer: Self-pay

## 2020-02-13 ENCOUNTER — Ambulatory Visit (HOSPITAL_COMMUNITY)
Admission: RE | Admit: 2020-02-13 | Discharge: 2020-02-13 | Disposition: A | Discharge status: Home / Self Care | Payer: Medicare Other | Source: Ambulatory Visit | Attending: Internal Medicine | Admitting: Internal Medicine

## 2020-02-13 DIAGNOSIS — M5126 Other intervertebral disc displacement, lumbar region: Secondary | ICD-10-CM | POA: Diagnosis not present

## 2020-02-13 DIAGNOSIS — M898X8 Other specified disorders of bone, other site: Secondary | ICD-10-CM | POA: Diagnosis not present

## 2020-02-13 DIAGNOSIS — C3410 Malignant neoplasm of upper lobe, unspecified bronchus or lung: Secondary | ICD-10-CM | POA: Diagnosis not present

## 2020-02-13 DIAGNOSIS — M47816 Spondylosis without myelopathy or radiculopathy, lumbar region: Secondary | ICD-10-CM | POA: Diagnosis not present

## 2020-02-13 DIAGNOSIS — Z85118 Personal history of other malignant neoplasm of bronchus and lung: Secondary | ICD-10-CM | POA: Diagnosis not present

## 2020-02-13 MED ORDER — GADOBUTROL 1 MMOL/ML IV SOLN
7.5000 mL | Freq: Once | INTRAVENOUS | Status: AC | PRN
  Administered 2020-02-13: 7.5 mL via INTRAVENOUS

## 2020-02-17 ENCOUNTER — Inpatient Hospital Stay: Payer: Medicare Other | Attending: Internal Medicine | Admitting: Internal Medicine

## 2020-02-17 ENCOUNTER — Other Ambulatory Visit: Payer: Self-pay

## 2020-02-17 VITALS — BP 130/71 | HR 71 | Temp 96.5°F | Resp 19 | Ht 71.0 in | Wt 172.9 lb

## 2020-02-17 DIAGNOSIS — C3490 Malignant neoplasm of unspecified part of unspecified bronchus or lung: Secondary | ICD-10-CM

## 2020-02-17 DIAGNOSIS — Z902 Acquired absence of lung [part of]: Secondary | ICD-10-CM | POA: Insufficient documentation

## 2020-02-17 DIAGNOSIS — C3412 Malignant neoplasm of upper lobe, left bronchus or lung: Secondary | ICD-10-CM | POA: Diagnosis not present

## 2020-02-17 DIAGNOSIS — Z85118 Personal history of other malignant neoplasm of bronchus and lung: Secondary | ICD-10-CM | POA: Insufficient documentation

## 2020-02-17 NOTE — Progress Notes (Signed)
[facility] Telephone:(336) [phone]   Fax:(336) [phone]  OFFICE PROGRESS NOTE  [name], MD [place] [address]  DIAGNOSIS: Stage IA (T1c, N0, M0) non-small cell lung cancer, adenocarcinoma with no actionable mutation and negative PDL 1 expression diagnosed in June 2020.   PRIOR THERAPY: Status post left upper lobectomy with lymph node dissection on June 24, 2018.  CURRENT THERAPY: Observation.  INTERVAL HISTORY: Miguel Wolfe 75 y.o. male returns to the clinic today for follow-up visit.  His wife was available by phone during the visit.  The patient is feeling fine today with no concerning complaints except for anxiety about the MRI results.  He denied having any current chest pain, shortness breath, cough or hemoptysis.  He denied having any nausea, vomiting, diarrhea or constipation.  He has no headache or visual changes.  He had MRI of the lumbar spine performed recently and he is here for evaluation and discussion of his scan results.  MEDICAL HISTORY: Past Medical History:  Diagnosis Date  . Anginal pain ([place])   . Arthritis   . Colon polyps   . Coronary artery disease involving native coronary artery of native heart without angina pectoris 04/23/2016   Myoview 02/08/16: EF 51, inf-lat, apical lateral scar with peri-infarct ischemia; intermediate risk  // LHC 2/18: dLAD 20, oD1 20, pLCx 20, pRCA 80, mRCA 20, dRCA 30, AM 90 >> PCI: 4 x 24 mm Synergy DES to pRCA   . Family history of adverse reaction to anesthesia    Mom vomits and Dad went "nuts"  . Family history of colon cancer   . Family history of stomach cancer   . Fatigue   . GERD (gastroesophageal reflux disease)   . Hemorrhoids   . History of echocardiogram    Echo 02/08/16: Mild LVH, EF 55-60, no RWMA, Gr 1 DD, trivial AI, mild LAE  . Hyperlipidemia   . Hypertension   . PONV (postoperative nausea and vomiting)   . Pre-diabetes   . Pulmonary nodules     ALLERGIES:  is  allergic to gemfibrozil, other, propranolol, niaspan [niacin er], and simvastatin.  MEDICATIONS:  Current Outpatient Medications  Medication Sig Dispense Refill  . acetaminophen (TYLENOL) 325 MG tablet Take 650 mg by mouth every 6 (six) hours as needed for moderate pain or headache.     Marland Kitchen amLODipine (NORVASC) 10 MG tablet Take 10 mg by mouth daily.    Marland Kitchen aspirin EC 81 MG tablet Take 81 mg by mouth daily.    . famotidine (PEPCID) 20 MG tablet Take 20 mg by mouth daily.    Marland Kitchen ibuprofen (ADVIL,MOTRIN) 200 MG tablet Take 400 mg by mouth every 8 (eight) hours as needed for headache or moderate pain.     Marland Kitchen levothyroxine (SYNTHROID) 150 MCG tablet Take 150 mcg by mouth daily before breakfast.     . losartan (COZAAR) 100 MG tablet Take 100 mg by mouth daily.    Marland Kitchen NASACORT ALLERGY 24HR 55 MCG/ACT AERO nasal inhaler SMARTSIG:2 Spray(s) Both Nares Every Night    . nitroGLYCERIN (NITROSTAT) 0.4 MG SL tablet Place 1 tablet (0.4 mg total) under the tongue every 5 (five) minutes as needed for chest pain. 25 tablet 12  . omega-3 acid ethyl esters (LOVAZA) 1 g capsule Take 2 g by mouth 2 (two) times daily.    . propranolol (INDERAL) 20 MG tablet Take 20 mg by mouth daily.  2  . rosuvastatin (CRESTOR) 20  MG tablet TAKE 1 TABLET BY MOUTH DAILY 90 tablet 3  . zolpidem (AMBIEN) 10 MG tablet Take 10 mg by mouth at bedtime as needed for sleep.   0   No current facility-administered medications for this visit.    SURGICAL HISTORY:  Past Surgical History:  Procedure Laterality Date  . CARDIAC CATHETERIZATION    . COLONOSCOPY WITH ESOPHAGOGASTRODUODENOSCOPY (EGD)  2008  . CORONARY STENT INTERVENTION N/A 02/20/2016   Procedure: Coronary Stent Intervention;  Surgeon: Burnell Blanks, MD;  Location: [facility];  Service: Cardiovascular;  Laterality: N/A;  . ESOPHAGOGASTRODUODENOSCOPY    . LEFT HEART CATH AND CORONARY ANGIOGRAPHY N/A 02/20/2016   Procedure: Left Heart Cath and Coronary Angiography;   Surgeon: Burnell Blanks, MD;  Location: [facility];  Service: Cardiovascular;  Laterality: N/A;  . PILONIDAL CYCT    . REFRACTIVE SURGERY     retina tear repair  . THYROIDECTOMY N/A 01/17/2018   Procedure: TOTAL THYROIDECTOMY;  Surgeon: Armandina Gemma, MD;  Location: [facility];  Service: General;  Laterality: N/A;  . VIDEO ASSISTED THORACOSCOPY (VATS)/WEDGE RESECTION Left 06/24/2018   Procedure: VIDEO ASSISTED THORACOSCOPY (VATS)/LUNG RESECTION;  Surgeon: Grace Isaac, MD;  Location: [facility];  Service: Thoracic;  Laterality: Left;  Marland Kitchen VIDEO BRONCHOSCOPY WITH ENDOBRONCHIAL NAVIGATION N/A 11/18/2017   Procedure: VIDEO BRONCHOSCOPY WITH ENDOBRONCHIAL NAVIGATION WITH BIOPSIES OF LEFT AND RIGHT UPPER LOBES;  Surgeon: Grace Isaac, MD;  Location: [facility];  Service: Thoracic;  Laterality: N/A;  . VIDEO BRONCHOSCOPY WITH ENDOBRONCHIAL NAVIGATION N/A 06/11/2018   Procedure: VIDEO BRONCHOSCOPY WITH ENDOBRONCHIAL NAVIGATION;  Surgeon: Grace Isaac, MD;  Location: [facility];  Service: Thoracic;  Laterality: N/A;    REVIEW OF SYSTEMS:  A comprehensive review of systems was negative except for: Behavioral/Psych: positive for anxiety   PHYSICAL EXAMINATION: General appearance: alert, cooperative and no distress Head: Normocephalic, without obvious abnormality, atraumatic Neck: no adenopathy, no JVD, supple, symmetrical, trachea midline and thyroid not enlarged, symmetric, no tenderness/mass/nodules Lymph nodes: Cervical, supraclavicular, and axillary nodes normal. Resp: clear to auscultation bilaterally Back: symmetric, no curvature. ROM normal. No CVA tenderness. Cardio: regular rate and rhythm, S1, S2 normal, no murmur, click, rub or gallop GI: soft, non-tender; bowel sounds normal; no masses,  no organomegaly Extremities: extremities normal, atraumatic, no cyanosis or edema  ECOG PERFORMANCE STATUS: 1 - Symptomatic but completely ambulatory  Blood pressure 130/71, pulse 71,  temperature (!) 96.5 F (35.8 C), temperature source Tympanic, resp. rate 19, height 5\' 11"  (1.803 m), weight 172 lb 14.4 oz (78.4 kg), SpO2 99 %.  LABORATORY DATA: Lab Results  Component Value Date   WBC 7.0 02/02/2020   HGB 15.4 02/02/2020   HCT 46.3 02/02/2020   MCV 90.6 02/02/2020   PLT 176 02/02/2020      Chemistry      Component Value Date/Time   NA 144 02/02/2020 1016   NA 143 04/24/2016 0935   K 4.1 02/02/2020 1016   CL 105 02/02/2020 1016   CO2 30 02/02/2020 1016   BUN 19 02/02/2020 1016   BUN 17 04/24/2016 0935   CREATININE 0.84 02/02/2020 1016      Component Value Date/Time   CALCIUM 9.0 02/02/2020 1016   ALKPHOS 70 02/02/2020 1016   AST 22 02/02/2020 1016   ALT 17 02/02/2020 1016   BILITOT 0.9 02/02/2020 1016       RADIOGRAPHIC STUDIES: CT Chest W Contrast  Result Date: 02/02/2020 CLINICAL DATA:  Restaging non-small cell lung  cancer. History of partial left lung resection 06/24/2018. EXAM: CT CHEST WITH CONTRAST TECHNIQUE: Multidetector CT imaging of the chest was performed during intravenous contrast administration. CONTRAST:  73mL OMNIPAQUE IOHEXOL 300 MG/ML  SOLN COMPARISON:  CT chest 08/03/2019 and 02/02/2019. FINDINGS: Cardiovascular: No acute vascular findings. There is atherosclerosis of the aorta, great vessels and coronary arteries. The heart size is normal. There is no pericardial effusion. Mediastinum/Nodes: There are no enlarged mediastinal, hilar or axillary lymph nodes.Status post thyroidectomy. The esophagus and trachea appear unremarkable. Lungs/Pleura: Stable postsurgical changes from previous left upper lobe resection. The previously demonstrated left-sided pneumothorax has resolved. There is no pleural effusion. There is a stable ground-glass density at the right lung apex with internal linear components. No developing soft tissue components, new or enlarging pulmonary nodules. Upper abdomen: Right paraspinal/retrocrural soft tissue nodule at L1  measuring 2.6 x 1.6 cm on image 162/2 is unchanged. This was not hypermetabolic on prior PET-CT and is likely an incidental nerve sheath lesion. The visualized upper abdomen appears stable without significant findings. Musculoskeletal/Chest wall: There is a lucent lesion with sclerotic margins posteriorly in the L1 vertebral body which has mildly enlarged compared with the prior studies. This measures approximately 1.9 x 1.4 x 1.7 cm and is indeterminate based on interval enlargement. No other focal osseous lesions are identified. IMPRESSION: 1. Indeterminate lucent lesion with sclerotic margins posteriorly in the L1 vertebral body has mildly enlarged compared with prior studies and is potentially a metastasis. Consider further evaluation with lumbar MRI without and with contrast. 2. Otherwise stable chest CT without additional evidence metastatic disease or local recurrence post left upper lobe resection. Interval resolution of previously demonstrated small left-sided pneumothorax. 3. Stable right apical ground-glass density for which continued follow-up recommended. 4. Aortic Atherosclerosis (ICD10-I70.0). Electronically Signed   By: Richardean Sale M.D.   On: 02/02/2020 13:51   MR Lumbar Spine W Wo Contrast  Result Date: 02/13/2020 CLINICAL DATA:  L1 bone lesion seen on recent CT scan. History of lung cancer. Evaluate for metastatic disease. EXAM: MRI LUMBAR SPINE WITHOUT AND WITH CONTRAST TECHNIQUE: Multiplanar and multiecho pulse sequences of the lumbar spine were obtained without and with intravenous contrast. CONTRAST:  7.79mL GADAVIST GADOBUTROL 1 MMOL/ML IV SOLN COMPARISON:  CT chest 02/02/2020, 08/03/2019 FINDINGS: Segmentation:  Standard. Alignment:  Physiologic. Vertebrae: 2.4 cm T1 hypointense and T2 hyperintense bone lesion in the L1 vertebral body with fine internal trabeculation and enhancement on postcontrast imaging. Other T1 and T2 hyperintense bone lesions in the lumbar vertebral bodies  consistent with small hemangiomas. No fracture, evidence of discitis, or other bone lesion. Conus medullaris and cauda equina: Conus extends to the T12-L1 level. Conus and cauda equina appear normal. Paraspinal and other soft tissues: No acute paraspinal abnormality. Disc levels: Disc spaces: Degenerative disease with disc height loss at L5-S1. Disc desiccation at L4-5. T12-L1: No significant disc bulge. No evidence of neural foraminal stenosis. No central canal stenosis. L1-L2: Minimal broad-based disc bulge. No evidence of neural foraminal stenosis. No central canal stenosis. L2-L3: No significant disc bulge. No evidence of neural foraminal stenosis. No central canal stenosis. L3-L4: No significant disc bulge. No evidence of neural foraminal stenosis. No central canal stenosis. L4-L5: Minimal broad-based disc bulge. No evidence of neural foraminal stenosis. No central canal stenosis. L5-S1: Minimal broad-based disc bulge. Mild bilateral facet arthropathy. Mild bilateral foraminal narrowing. No evidence of neural foraminal stenosis. No central canal stenosis. IMPRESSION: 1. A 2.4 cm bone lesion in the L1 vertebral body with fine  internal trabeculation and enhancement on postcontrast imaging. The overall appearance is more suggestive of an atypical hemangioma, but given the patient's given the history of lung cancer, follow-up MRI of the lumbar spine is recommended in 3-6 months to document ongoing stability. 2. Mild lumbar spine spondylosis. No evidence of nerve root impingement or central canal stenosis. Electronically Signed   By: Kathreen Devoid   On: 02/13/2020 13:16    ASSESSMENT AND PLAN: This is a very pleasant 75 years old white male with stage Ia non-small cell lung cancer, adenocarcinoma status post left upper lobectomy with lymph node dissection under the care of Dr. Servando Snare in June 2020. The patient is currently on observation and he is feeling fine today with no concerning complaints.  MRI of the  lumbar spine performed recently showed a 2.4 cm bone lesion in the L1 vertebral body with fine internal trabeculation and enhancement on the postcontrast images suggestive of atypical hemangioma. I discussed the results with the patient I recommended for him to continue on observation. I will see him back for follow-up visit in 6 months with repeat CT scan of the chest for restaging of his disease. The patient was advised to call immediately if he has any other concerning symptoms in the interval. The patient voices understanding of current disease status and treatment options and is in agreement with the current care plan.  All questions were answered. The patient knows to call the clinic with any problems, questions or concerns. We can certainly see the patient much sooner if necessary.   Disclaimer: This note was dictated with voice recognition software. Similar sounding words can inadvertently be transcribed and may not be corrected upon review.

## 2020-02-22 DIAGNOSIS — Z85850 Personal history of malignant neoplasm of thyroid: Secondary | ICD-10-CM | POA: Diagnosis not present

## 2020-02-22 DIAGNOSIS — E890 Postprocedural hypothyroidism: Secondary | ICD-10-CM | POA: Diagnosis not present

## 2020-02-24 DIAGNOSIS — Z9889 Other specified postprocedural states: Secondary | ICD-10-CM | POA: Diagnosis not present

## 2020-02-24 DIAGNOSIS — Z85850 Personal history of malignant neoplasm of thyroid: Secondary | ICD-10-CM | POA: Diagnosis not present

## 2020-02-24 DIAGNOSIS — R7309 Other abnormal glucose: Secondary | ICD-10-CM | POA: Diagnosis not present

## 2020-02-24 DIAGNOSIS — E890 Postprocedural hypothyroidism: Secondary | ICD-10-CM | POA: Diagnosis not present

## 2020-03-02 DIAGNOSIS — E890 Postprocedural hypothyroidism: Secondary | ICD-10-CM | POA: Diagnosis not present

## 2020-03-02 DIAGNOSIS — E782 Mixed hyperlipidemia: Secondary | ICD-10-CM | POA: Diagnosis not present

## 2020-03-02 DIAGNOSIS — G43109 Migraine with aura, not intractable, without status migrainosus: Secondary | ICD-10-CM | POA: Diagnosis not present

## 2020-03-02 DIAGNOSIS — K219 Gastro-esophageal reflux disease without esophagitis: Secondary | ICD-10-CM | POA: Diagnosis not present

## 2020-03-02 DIAGNOSIS — Z85118 Personal history of other malignant neoplasm of bronchus and lung: Secondary | ICD-10-CM | POA: Diagnosis not present

## 2020-03-02 DIAGNOSIS — I2510 Atherosclerotic heart disease of native coronary artery without angina pectoris: Secondary | ICD-10-CM | POA: Diagnosis not present

## 2020-03-02 DIAGNOSIS — I10 Essential (primary) hypertension: Secondary | ICD-10-CM | POA: Diagnosis not present

## 2020-03-02 DIAGNOSIS — G4700 Insomnia, unspecified: Secondary | ICD-10-CM | POA: Diagnosis not present

## 2020-03-03 ENCOUNTER — Ambulatory Visit (INDEPENDENT_AMBULATORY_CARE_PROVIDER_SITE_OTHER): Payer: Medicare Other | Admitting: Cardiothoracic Surgery

## 2020-03-03 ENCOUNTER — Other Ambulatory Visit: Payer: Self-pay

## 2020-03-03 VITALS — BP 150/85 | HR 72 | Resp 20 | Ht 71.0 in | Wt 172.0 lb

## 2020-03-03 DIAGNOSIS — Z902 Acquired absence of lung [part of]: Secondary | ICD-10-CM

## 2020-03-03 DIAGNOSIS — Z85118 Personal history of other malignant neoplasm of bronchus and lung: Secondary | ICD-10-CM | POA: Diagnosis not present

## 2020-03-03 NOTE — Progress Notes (Signed)
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 SURGICAL PROCEDURE:  Left video-assisted thoracoscopy, left upper lobectomy with lymph node dissection, and intercostal nerve block.  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 - MARGINS NOT INVOLVED. - TUMOR CONFINED WITHIN THYROID CAPSULE.  History of Present Illness:    Patient returns to the office in follow-up after left upper lobectomy June 23, 2018 from her left upper lobe adenocarcinoma of the lung stage I A3.  He is also followed for groundglass opacity right lung apex.  His most recent CT scan was done January 25 there was a indeterminate lucent lesion with sclerotic margins in the L1 vertebral body-subsequent MRI was done February 25-a 2.4 cm bone lesion in L1 vertebrae was interpreted as suggestive of an hemangioma but a follow-up MRI was recommended in the 3 to 52-monthrange.   He is closely followed in the medical oncology clinic by Dr. MGlendell Dockerhas had his scans done through the oncology center.  He notes that he does have follow-up for his previously treated thyroid carcinoma.    He has  been fully vaccinated including booster for Covid   Zubrod Score: At the time of surgery this patient's most appropriate activity status/level should be described as: [x]     0    Normal activity, no symptoms []     1    Restricted in physical strenuous activity but ambulatory, able to do out light work []     2    Ambulatory and capable of self care, unable to do work activities, up and about                 >50 % of waking hours                                                                                   []     3    Only limited self care, in bed greater than 50% of waking hours []     4    Completely disabled, no self care, confined to bed or chair []     5    Moribund  Social History   Tobacco Use  Smoking Status Former Smoker  . Packs/day: 1.00  . Years: 3.00  .  Pack years: 3.00  . Types: Cigarettes  . Quit date: 02/01/1967  . Years since quitting: 53.1  Smokeless Tobacco Never Used       Allergies  Allergen Reactions  . Gemfibrozil Other (See Comments)    Muscle pain  . Other Other (See Comments)  . Propranolol     Other reaction(s): SOB  Only had issue with Slow release propranolol  . Niaspan [Niacin Er] Rash and Other (See Comments)    Flushing - aspirin did not mitigate   . Simvastatin Other (See Comments)    Drug-Drug interaction with amlodipine    Current Outpatient Medications  Medication Sig Dispense Refill  . acetaminophen (TYLENOL) 325 MG tablet Take 650 mg by mouth every 6 (six) hours as needed for moderate pain or headache.     Marland Kitchen amLODipine (NORVASC) 10 MG tablet Take 10 mg by mouth daily.    Marland Kitchen aspirin EC 81 MG tablet Take 81 mg by mouth daily.    . famotidine (PEPCID) 20 MG tablet Take 20 mg by mouth daily.    Marland Kitchen ibuprofen (ADVIL,MOTRIN) 200 MG tablet Take 400 mg by mouth every 8 (eight) hours as needed for headache or moderate pain.     Marland Kitchen levothyroxine (SYNTHROID) 150 MCG tablet Take 175 mcg by mouth daily before breakfast.    . losartan (COZAAR) 100  MG tablet Take 100 mg by mouth daily.    Marland Kitchen NASACORT ALLERGY 24HR 55 MCG/ACT AERO nasal inhaler SMARTSIG:2 Spray(s) Both Nares Every Night    . nitroGLYCERIN (NITROSTAT) 0.4 MG SL tablet Place 1 tablet (0.4 mg total) under the tongue every 5 (five) minutes as needed for chest pain. 25 tablet 12  . Omega-3 Fatty Acids (FISH OIL) 1000 MG CAPS Take by mouth.    . pantoprazole (PROTONIX) 40 MG tablet Take 40 mg by mouth daily.    . propranolol (INDERAL) 20 MG tablet Take 20 mg by mouth daily.  2  . rosuvastatin (CRESTOR) 20 MG tablet TAKE 1 TABLET BY MOUTH DAILY 90 tablet 3  . zolpidem (AMBIEN) 10 MG tablet Take 10 mg by mouth at bedtime as needed for sleep.   0   No current facility-administered medications for this visit.       Physical Exam: BP (!) 150/85   Pulse 72   Resp 20   Ht 5' 11"  (1.803 m)   Wt 172 lb (78 kg)   SpO2 97% Comment: RA  BMI 23.99 kg/m   General appearance: alert, cooperative and no distress Head: Normocephalic, without obvious abnormality, atraumatic Neck: no adenopathy, no carotid bruit, no JVD, supple, symmetrical, trachea midline and thyroid not enlarged, symmetric, no tenderness/mass/nodules Lymph nodes: Cervical, supraclavicular, and axillary nodes normal. Back: symmetric, no curvature. ROM normal. No CVA tenderness. Cardio: regular rate and rhythm, S1, S2 normal, no murmur, click, rub or gallop GI: soft, non-tender; bowel sounds normal; no masses,  no organomegaly Extremities: extremities normal, atraumatic, no cyanosis or edema and Homans sign is negative, no sign of DVT Neurologic: Grossly normal   Diagnostic Studies & Laboratory data:         Recent Radiology Findings: CT Chest W Contrast  Result Date: 02/02/2020 CLINICAL DATA:  Restaging non-small cell lung cancer. History of partial left lung resection 06/24/2018. EXAM: CT CHEST WITH CONTRAST TECHNIQUE: Multidetector CT imaging of the chest was performed during intravenous contrast  administration. CONTRAST:  5m OMNIPAQUE IOHEXOL 300 MG/ML  SOLN COMPARISON:  CT chest 08/03/2019 and 02/02/2019. FINDINGS: Cardiovascular: No acute vascular  findings. There is atherosclerosis of the aorta, great vessels and coronary arteries. The heart size is normal. There is no pericardial effusion. Mediastinum/Nodes: There are no enlarged mediastinal, hilar or axillary lymph nodes.Status post thyroidectomy. The esophagus and trachea appear unremarkable. Lungs/Pleura: Stable postsurgical changes from previous left upper lobe resection. The previously demonstrated left-sided pneumothorax has resolved. There is no pleural effusion. There is a stable ground-glass density at the right lung apex with internal linear components. No developing soft tissue components, new or enlarging pulmonary nodules. Upper abdomen: Right paraspinal/retrocrural soft tissue nodule at L1 measuring 2.6 x 1.6 cm on image 162/2 is unchanged. This was not hypermetabolic on prior PET-CT and is likely an incidental nerve sheath lesion. The visualized upper abdomen appears stable without significant findings. Musculoskeletal/Chest wall: There is a lucent lesion with sclerotic margins posteriorly in the L1 vertebral body which has mildly enlarged compared with the prior studies. This measures approximately 1.9 x 1.4 x 1.7 cm and is indeterminate based on interval enlargement. No other focal osseous lesions are identified. IMPRESSION: 1. Indeterminate lucent lesion with sclerotic margins posteriorly in the L1 vertebral body has mildly enlarged compared with prior studies and is potentially a metastasis. Consider further evaluation with lumbar MRI without and with contrast. 2. Otherwise stable chest CT without additional evidence metastatic disease or local recurrence post left upper lobe resection. Interval resolution of previously demonstrated small left-sided pneumothorax. 3. Stable right apical ground-glass density for which continued  follow-up recommended. 4. Aortic Atherosclerosis (ICD10-I70.0). Electronically Signed   By: Richardean Sale M.D.   On: 02/02/2020 13:51   MR Lumbar Spine W Wo Contrast  Result Date: 02/13/2020 CLINICAL DATA:  L1 bone lesion seen on recent CT scan. History of lung cancer. Evaluate for metastatic disease. EXAM: MRI LUMBAR SPINE WITHOUT AND WITH CONTRAST TECHNIQUE: Multiplanar and multiecho pulse sequences of the lumbar spine were obtained without and with intravenous contrast. CONTRAST:  7.25m GADAVIST GADOBUTROL 1 MMOL/ML IV SOLN COMPARISON:  CT chest 02/02/2020, 08/03/2019 FINDINGS: Segmentation:  Standard. Alignment:  Physiologic. Vertebrae: 2.4 cm T1 hypointense and T2 hyperintense bone lesion in the L1 vertebral body with fine internal trabeculation and enhancement on postcontrast imaging. Other T1 and T2 hyperintense bone lesions in the lumbar vertebral bodies consistent with small hemangiomas. No fracture, evidence of discitis, or other bone lesion. Conus medullaris and cauda equina: Conus extends to the T12-L1 level. Conus and cauda equina appear normal. Paraspinal and other soft tissues: No acute paraspinal abnormality. Disc levels: Disc spaces: Degenerative disease with disc height loss at L5-S1. Disc desiccation at L4-5. T12-L1: No significant disc bulge. No evidence of neural foraminal stenosis. No central canal stenosis. L1-L2: Minimal broad-based disc bulge. No evidence of neural foraminal stenosis. No central canal stenosis. L2-L3: No significant disc bulge. No evidence of neural foraminal stenosis. No central canal stenosis. L3-L4: No significant disc bulge. No evidence of neural foraminal stenosis. No central canal stenosis. L4-L5: Minimal broad-based disc bulge. No evidence of neural foraminal stenosis. No central canal stenosis. L5-S1: Minimal broad-based disc bulge. Mild bilateral facet arthropathy. Mild bilateral foraminal narrowing. No evidence of neural foraminal stenosis. No central canal  stenosis. IMPRESSION: 1. A 2.4 cm bone lesion in the L1 vertebral body with fine internal trabeculation and enhancement on postcontrast imaging. The overall appearance is more suggestive of an atypical hemangioma, but given the patient's given the history of lung cancer, follow-up MRI of the lumbar spine is recommended in 3-6 months to document ongoing stability. 2. Mild lumbar spine spondylosis.  No evidence of nerve root impingement or central canal stenosis. Electronically Signed   By: Kathreen Devoid   On: 02/13/2020 13:16   I have independently reviewed the above radiology studies  and reviewed the findings with the patient.   CLINICAL DATA:  Lung cancer.  EXAM: CT CHEST WITH CONTRAST  TECHNIQUE: Multidetector CT imaging of the chest was performed during intravenous contrast administration.  CONTRAST:  51m OMNIPAQUE IOHEXOL 300 MG/ML  SOLN  COMPARISON:  02/02/2019.  FINDINGS: Cardiovascular: Atherosclerotic calcification of the aorta and coronary arteries. Heart size normal. Left ventricle appears dilated. No pericardial effusion.  Mediastinum/Nodes: Thyroidectomy. No pathologically enlarged mediastinal, hilar or axillary lymph nodes. Esophagus is unremarkable.  Lungs/Pleura: Left upper lobectomy with scarring in the left hemithorax. A 2.1 x 3.3 cm sub solid mass in the apical segment right upper lobe (7/23) has an internal linear component and is unchanged. Tiny loculated collections of pleural fluid and air in the anterior and posterior aspects of the medial left hemithorax, decreased from prior. Airway is unremarkable. Left hemithorax, unchanged.  Upper Abdomen: Visualized portions of the liver, gallbladder, adrenal glands, kidneys, spleen, pancreas, stomach and bowel are grossly unremarkable.  Musculoskeletal: Degenerative changes in the spine. No worrisome lytic or sclerotic lesions.  IMPRESSION: 1. Left upper lobectomy. No evidence of metastatic disease.  Tiny collections of loculated pleural fluid and air in the left hemithorax, improved from prior. 2. Predominantly ground-glass mass in the apical segment right upper lobe with an internal linear component, stable. Continued attention on follow-up exams is warranted as adenocarcinoma cannot be excluded. 3. Aortic atherosclerosis (ICD10-I70.0). Coronary artery calcification.   Electronically Signed   By: MLorin PicketM.D.   On: 08/03/2019 15:43    CLINICAL DATA:  Lung cancer.  EXAM: CT CHEST WITH CONTRAST  TECHNIQUE: Multidetector CT imaging of the chest was performed during intravenous contrast administration.  CONTRAST:  75mOMNIPAQUE IOHEXOL 300 MG/ML  SOLN  COMPARISON:  02/02/2019.  FINDINGS: Cardiovascular: Atherosclerotic calcification of the aorta and coronary arteries. Heart size normal. Left ventricle appears dilated. No pericardial effusion.  Mediastinum/Nodes: Thyroidectomy. No pathologically enlarged mediastinal, hilar or axillary lymph nodes. Esophagus is unremarkable.  Lungs/Pleura: Left upper lobectomy with scarring in the left hemithorax. A 2.1 x 3.3 cm sub solid mass in the apical segment right upper lobe (7/23) has an internal linear component and is unchanged. Tiny loculated collections of pleural fluid and air in the anterior and posterior aspects of the medial left hemithorax, decreased from prior. Airway is unremarkable. Left hemithorax, unchanged.  Upper Abdomen: Visualized portions of the liver, gallbladder, adrenal glands, kidneys, spleen, pancreas, stomach and bowel are grossly unremarkable.  Musculoskeletal: Degenerative changes in the spine. No worrisome lytic or sclerotic lesions.  IMPRESSION: 1. Left upper lobectomy. No evidence of metastatic disease. Tiny collections of loculated pleural fluid and air in the left hemithorax, improved from prior. 2. Predominantly ground-glass mass in the apical segment right  upper lobe with an internal linear component, stable. Continued attention on follow-up exams is warranted as adenocarcinoma cannot be excluded. 3. Aortic atherosclerosis (ICD10-I70.0). Coronary artery calcification.   Electronically Signed   By: MeLorin Picket.D.   On: 08/03/2019 15:43      Recent Labs: Lab Results  Component Value Date   WBC 7.0 02/02/2020   HGB 15.4 02/02/2020   HCT 46.3 02/02/2020   PLT 176 02/02/2020   GLUCOSE 95 02/02/2020   CHOL 151 01/21/2017   TRIG 177 (H) 01/21/2017   HDL 48 01/21/2017  LDLCALC 68 01/21/2017   ALT 17 02/02/2020   AST 22 02/02/2020   NA 144 02/02/2020   K 4.1 02/02/2020   CL 105 02/02/2020   CREATININE 0.84 02/02/2020   BUN 19 02/02/2020   CO2 30 02/02/2020   TSH 10.736 (H) 01/24/2018   INR 1.1 06/20/2018   HGBA1C 5.9 (H) 06/09/2018    PATH: Diagnosis 1. Lung, resection (segmental or lobe), left upper lobe - ADENOCARCINOMA, 2.3 CM. - THREE BENIGN LYMPH NODES (0/3). - MARGINS NOT INVOLVED. 2. Lymph node, biopsy, 10 L node - ANTHRACOTIC LYMPH NODE. - NO METASTATIC CARCINOMA IDENTIFIED. 3. Lymph node, biopsy, 10 L #2 - ANTHRACOTIC LYMPH NODE. - NO METASTATIC CARCINOMA IDENTIFIED. 4. Lymph node, biopsy, 11 L node - ANTHRACOTIC LYMPH NODE. - NO METASTATIC CARCINOMA IDENTIFIED. 5. Lymph node, biopsy, 4 L node - ANTHRACOTIC LYMPH NODE. - NO METASTATIC CARCINOMA IDENTIFIED. 6. Lymph node, biopsy, 4 L #2 node - ANTHRACOTIC LYMPH NODE. - NO METASTATIC CARCINOMA IDENTIFIED. 7. Lymph node, biopsy, 11 L #2 node - ANTHRACOTIC LYMPH NODE. - NO METASTATIC CARCINOMA IDENTIFIED. 8. Lymph node, biopsy, 12 L node - ANTHRACOTIC LYMPH NODE. - NO METASTATIC CARCINOMA IDENTIFIED. 9. Lymph node, biopsy, 12 L #2 node - ANTHRACOTIC LYMPH NODE. - NO METASTATIC CARCINOMA IDENTIFIED. 10. Lymph node, biopsy, Level 8 Paraesophageal - ANTHRACOTIC LYMPH NODE. 1 of 3 FINAL for Olivier, Garrison L ([id number]) Diagnosis(continued) -  NO METASTATIC CARCINOMA IDENTIFIED. 11. Lymph node, biopsy, 4 L #3 node - ANTHRACOTIC LYMPH NODE. - NO METASTATIC CARCINOMA IDENTIFIED. Microscopic Comment 1. LUNG: Procedure: Left upper lobectomy and ten additional lymph node biopsies. Specimen Laterality: Left. Tumor Site: Left upper lobe. Tumor Size: 2.3 x 2.0 x 1.7 cm. Total Tumor Size Inclusive of Invasive and Lepidic Components (applies only to invasive nonmucinous adenocarcinoma with a lepidic component): 2.3 cm. Invasive Tumor Size (applies only to invasive nonmucinous adenocarcinoma with a lepidic component: 2.3 cm. Tumor Focality: Unifocal. Histologic Type: Adenocarcinoma. Visceral Pleura Invasion: No. Lymphovascular Invasion: No. Direct Invasion of Adjacent Structures: N/A. Margins: Free of tumor. Treatment Effect: No. Regional Lymph Nodes: Number of Lymph Nodes Involved: 0. Number of Lymph Nodes Examined: 13. Pathologic Stage Classification (pTNM, AJCC 8th Edition): pTic, pN0. Ancillary Studies: Foundation 1 and PDL-1 pending. Representative Tumor Block: 1E and 28F. (JDP:ah 06/25/18) (v4.0.0.3) Claudette Laws MD  Assessment / Plan:   #1 stage Ia non-small cell lung cancer left upper lobe adenocarcinoma status post left upper lobectomy and node dissection- now 19 months postop   #2 groundglass opacity being followed right upper lobe-stable on CT T scan serially-appears unchanged on the most current CT scan- Indeterminate lucent lesion with sclerotic margins posteriorly in the L1 vertebral body has mildly enlarged compared with prior studies. The overall appearance is more suggestive of an atypical hemangioma, but given the patient's given the history of lung cancer, follow-up MRI of the lumbar spine is recommended in 3-6 months   #3 history of incidental finding of papillary thyroid carcinoma 2.2 cm in size involving the right lobe of the thyroid margins not involved tumor confined to the thyroid capsule resected   Patient  is closely followed in the medical oncology clinic.  But he would like to establish rapport with surgery in case he needs further operative intervention.  Scans will be ordered by Dr. Julien Nordmann.  We will arrange for for follow-up appointment with Dr. Kipp Brood in 6 months.    Medication Changes: No orders of the defined types were placed in this encounter.  Grace Isaac 03/07/2020 9:46 AM

## 2020-03-22 DIAGNOSIS — E890 Postprocedural hypothyroidism: Secondary | ICD-10-CM | POA: Diagnosis not present

## 2020-03-22 DIAGNOSIS — K219 Gastro-esophageal reflux disease without esophagitis: Secondary | ICD-10-CM | POA: Diagnosis not present

## 2020-03-22 DIAGNOSIS — G43109 Migraine with aura, not intractable, without status migrainosus: Secondary | ICD-10-CM | POA: Diagnosis not present

## 2020-03-22 DIAGNOSIS — I2510 Atherosclerotic heart disease of native coronary artery without angina pectoris: Secondary | ICD-10-CM | POA: Diagnosis not present

## 2020-03-22 DIAGNOSIS — I10 Essential (primary) hypertension: Secondary | ICD-10-CM | POA: Diagnosis not present

## 2020-03-22 DIAGNOSIS — E782 Mixed hyperlipidemia: Secondary | ICD-10-CM | POA: Diagnosis not present

## 2020-03-22 DIAGNOSIS — Z85118 Personal history of other malignant neoplasm of bronchus and lung: Secondary | ICD-10-CM | POA: Diagnosis not present

## 2020-03-22 DIAGNOSIS — G4700 Insomnia, unspecified: Secondary | ICD-10-CM | POA: Diagnosis not present

## 2020-04-01 ENCOUNTER — Other Ambulatory Visit (INDEPENDENT_AMBULATORY_CARE_PROVIDER_SITE_OTHER): Payer: Self-pay | Admitting: Otolaryngology

## 2020-04-06 DIAGNOSIS — Z85850 Personal history of malignant neoplasm of thyroid: Secondary | ICD-10-CM | POA: Diagnosis not present

## 2020-04-06 DIAGNOSIS — R7309 Other abnormal glucose: Secondary | ICD-10-CM | POA: Diagnosis not present

## 2020-04-06 DIAGNOSIS — E890 Postprocedural hypothyroidism: Secondary | ICD-10-CM | POA: Diagnosis not present

## 2020-04-29 ENCOUNTER — Encounter: Payer: Self-pay | Admitting: Internal Medicine

## 2020-04-29 ENCOUNTER — Telehealth: Payer: Self-pay | Admitting: Cardiology

## 2020-04-29 NOTE — Telephone Encounter (Signed)
Pt c/o medication issue:  1. Name of Medication: rosuvastatin (CRESTOR) 20 MG tablet  2. How are you currently taking this medication (dosage and times per day)? As prescribed   3. Are you having a reaction (difficulty breathing--STAT)? Yes  4. What is your medication issue? Lenna Sciara is calling on behalf of Hillery Aldo a clinical pharmacist with their office. She states Holley Dexter saw [place] on Tuesday, 04/26/20. During their visit the pt reported increased myalgia on and x1 year, but it has recently worsened in the last 6 weeks. Melissa also states the patient reported he tried stopping his rosuvastatin for a day and felt better from it. Ezra also tried Co-Q10 and it did not help. Holley Dexter is requesting possibly decreasing his rosuvastatin to 20 MG's every other day or changing his medication to pravastatin 80 MG's. Lenna Sciara is requesting a callback to receive Dr. Marlou Porch recommendations or guidance on this. Please advise.

## 2020-04-29 NOTE — Telephone Encounter (Signed)
Spoke with [facility] and advised Dr Marlou Porch is out of the office today.  Will forward information for Dr Marlou Porch review next week.  Melissa verbalizes understanding and thanked Therapist, sports for the callback

## 2020-05-02 NOTE — Telephone Encounter (Signed)
He has had trouble with different statins in the past. I am okay with trying Crestor 20 mg every other day Lets also get him set up with our lipid clinic to discuss other options, potentially PCSK9 inhibitors. Candee Furbish, MD

## 2020-05-03 DIAGNOSIS — E782 Mixed hyperlipidemia: Secondary | ICD-10-CM | POA: Diagnosis not present

## 2020-05-03 DIAGNOSIS — E890 Postprocedural hypothyroidism: Secondary | ICD-10-CM | POA: Diagnosis not present

## 2020-05-03 DIAGNOSIS — G4700 Insomnia, unspecified: Secondary | ICD-10-CM | POA: Diagnosis not present

## 2020-05-03 DIAGNOSIS — I10 Essential (primary) hypertension: Secondary | ICD-10-CM | POA: Diagnosis not present

## 2020-05-03 DIAGNOSIS — G43109 Migraine with aura, not intractable, without status migrainosus: Secondary | ICD-10-CM | POA: Diagnosis not present

## 2020-05-03 DIAGNOSIS — I2510 Atherosclerotic heart disease of native coronary artery without angina pectoris: Secondary | ICD-10-CM | POA: Diagnosis not present

## 2020-05-03 DIAGNOSIS — K219 Gastro-esophageal reflux disease without esophagitis: Secondary | ICD-10-CM | POA: Diagnosis not present

## 2020-05-03 NOTE — Telephone Encounter (Signed)
Called and spoke with Melissa who is aware of Dr Marlou Porch recommendations.  They will have pt call back to schedule with Lipid Clinic if so desired/ordered.

## 2020-05-04 DIAGNOSIS — K219 Gastro-esophageal reflux disease without esophagitis: Secondary | ICD-10-CM | POA: Diagnosis not present

## 2020-05-04 DIAGNOSIS — Z86010 Personal history of colonic polyps: Secondary | ICD-10-CM | POA: Diagnosis not present

## 2020-05-04 DIAGNOSIS — R490 Dysphonia: Secondary | ICD-10-CM | POA: Diagnosis not present

## 2020-05-17 ENCOUNTER — Telehealth: Payer: Self-pay | Admitting: Cardiology

## 2020-05-17 NOTE — Telephone Encounter (Signed)
Spoke with patient who is having continued fatigue, muscle weakness etc.  Dr Marlou Porch is aware and advised pt to be schedule with him on Thursday 5/12.  Pt is agreeable and will be here on Thursday.

## 2020-05-17 NOTE — Telephone Encounter (Signed)
   Pt is calling wanted to see Dr. Marlou Porch or APP sooner, he prefers next week after echo, he said he's fatigue is getting worst, he did follow and decreased his crestor but his legs and back still hurting a lot and no energy.

## 2020-05-19 ENCOUNTER — Other Ambulatory Visit: Payer: Self-pay

## 2020-05-19 ENCOUNTER — Encounter: Payer: Self-pay | Admitting: Cardiology

## 2020-05-19 ENCOUNTER — Ambulatory Visit (INDEPENDENT_AMBULATORY_CARE_PROVIDER_SITE_OTHER): Payer: Medicare Other | Admitting: Cardiology

## 2020-05-19 VITALS — BP 122/62 | HR 66 | Ht 71.0 in | Wt 166.0 lb

## 2020-05-19 DIAGNOSIS — C3490 Malignant neoplasm of unspecified part of unspecified bronchus or lung: Secondary | ICD-10-CM | POA: Diagnosis not present

## 2020-05-19 DIAGNOSIS — I2510 Atherosclerotic heart disease of native coronary artery without angina pectoris: Secondary | ICD-10-CM

## 2020-05-19 NOTE — Patient Instructions (Signed)
Medication Instructions:  The current medical regimen is effective;  continue present plan and medications.  *If you need a refill on your cardiac medications before your next appointment, please call your pharmacy*  Follow-Up: At [facility], you and your health needs are our priority.  As part of our continuing mission to provide you with exceptional heart care, we have created designated Provider Care Teams.  These Care Teams include your primary Cardiologist (physician) and Advanced Practice Providers (APPs -  Physician Assistants and Nurse Practitioners) who all work together to provide you with the care you need, when you need it.  We recommend signing up for the patient portal called "MyChart".  Sign up information is provided on this After Visit Summary.  MyChart is used to connect with patients for Virtual Visits (Telemedicine).  Patients are able to view lab/test results, encounter notes, upcoming appointments, etc.  Non-urgent messages can be sent to your provider as well.   To learn more about what you can do with MyChart, go to [email or web address].    Your next appointment:   Follow up as scheduled with Dr Marlou Porch.  Thank you for choosing [facility]!!

## 2020-05-19 NOTE — Progress Notes (Signed)
Cardiology Office Note:    Date:  05/19/2020   ID:  Miguel Wolfe, DOB 04-16-1945, MRN [id number]  PCP:  Aretta Nip, MD   [facility] Providers Cardiologist:  Candee Furbish, MD     Referring MD: [name], MD     History of Present Illness:    Miguel Wolfe is a 75 y.o. male here for the follow-up of low energy.  This is been bothering him for quite some time.  Has seen endocrinology in the spring.  His Synthroid was increased slightly.  We have asked him to hold his Crestor.  Excellent pedal pulses.  Feeling a little bit more unsteady with his walk.  States that more recently has had more bad days than good days.  Frustrating for him.   Past Medical History:  Diagnosis Date  . Anginal pain ([place])   . Arthritis   . Colon polyps   . Coronary artery disease involving native coronary artery of native heart without angina pectoris 04/23/2016   Myoview 02/08/16: EF 51, inf-lat, apical lateral scar with peri-infarct ischemia; intermediate risk  // LHC 2/18: dLAD 20, oD1 20, pLCx 20, pRCA 80, mRCA 20, dRCA 30, AM 90 >> PCI: 4 x 24 mm Synergy DES to pRCA   . Family history of adverse reaction to anesthesia    Mom vomits and Dad went "nuts"  . Family history of colon cancer   . Family history of stomach cancer   . Fatigue   . GERD (gastroesophageal reflux disease)   . Hemorrhoids   . History of echocardiogram    Echo 02/08/16: Mild LVH, EF 55-60, no RWMA, Gr 1 DD, trivial AI, mild LAE  . Hyperlipidemia   . Hypertension   . PONV (postoperative nausea and vomiting)   . Pre-diabetes   . Pulmonary nodules     Past Surgical History:  Procedure Laterality Date  . CARDIAC CATHETERIZATION    . COLONOSCOPY WITH ESOPHAGOGASTRODUODENOSCOPY (EGD)  2008  . CORONARY STENT INTERVENTION N/A 02/20/2016   Procedure: Coronary Stent Intervention;  Surgeon: Burnell Blanks, MD;  Location: [place] [facility];  Service: Cardiovascular;  Laterality: N/A;  .  ESOPHAGOGASTRODUODENOSCOPY    . LEFT HEART CATH AND CORONARY ANGIOGRAPHY N/A 02/20/2016   Procedure: Left Heart Cath and Coronary Angiography;  Surgeon: Burnell Blanks, MD;  Location: [place] [facility];  Service: Cardiovascular;  Laterality: N/A;  . PILONIDAL CYCT    . REFRACTIVE SURGERY     retina tear repair  . THYROIDECTOMY N/A 01/17/2018   Procedure: TOTAL THYROIDECTOMY;  Surgeon: Armandina Gemma, MD;  Location: [facility];  Service: General;  Laterality: N/A;  . VIDEO ASSISTED THORACOSCOPY (VATS)/WEDGE RESECTION Left 06/24/2018   Procedure: VIDEO ASSISTED THORACOSCOPY (VATS)/LUNG RESECTION;  Surgeon: Grace Isaac, MD;  Location: [place];  Service: Thoracic;  Laterality: Left;  Marland Kitchen VIDEO BRONCHOSCOPY WITH ENDOBRONCHIAL NAVIGATION N/A 11/18/2017   Procedure: VIDEO BRONCHOSCOPY WITH ENDOBRONCHIAL NAVIGATION WITH BIOPSIES OF LEFT AND RIGHT UPPER LOBES;  Surgeon: Grace Isaac, MD;  Location: [place];  Service: Thoracic;  Laterality: N/A;  . VIDEO BRONCHOSCOPY WITH ENDOBRONCHIAL NAVIGATION N/A 06/11/2018   Procedure: VIDEO BRONCHOSCOPY WITH ENDOBRONCHIAL NAVIGATION;  Surgeon: Grace Isaac, MD;  Location: [place];  Service: Thoracic;  Laterality: N/A;    Current Medications: Current Meds  Medication Sig  . acetaminophen (TYLENOL) 325 MG tablet Take 650 mg by mouth every 6 (six) hours as needed for moderate pain or headache.   Marland Kitchen amLODipine (NORVASC)  10 MG tablet Take 10 mg by mouth daily.  Marland Kitchen aspirin EC 81 MG tablet Take 81 mg by mouth daily.  . famotidine (PEPCID) 20 MG tablet Take 20 mg by mouth daily.  Marland Kitchen ibuprofen (ADVIL,MOTRIN) 200 MG tablet Take 400 mg by mouth every 8 (eight) hours as needed for headache or moderate pain.   Marland Kitchen levothyroxine (SYNTHROID) 175 MCG tablet Take 175 mcg by mouth daily.  Marland Kitchen losartan (COZAAR) 100 MG tablet Take 100 mg by mouth daily.  . nitroGLYCERIN (NITROSTAT) 0.4 MG SL tablet Place 1 tablet (0.4 mg total) under the tongue every 5 (five) minutes as needed  for chest pain.  . Omega-3 Fatty Acids (FISH OIL) 1000 MG CAPS Take by mouth.  . pantoprazole (PROTONIX) 40 MG tablet Take 40 mg by mouth daily.  . propranolol (INDERAL) 20 MG tablet Take 20 mg by mouth daily.  . rosuvastatin (CRESTOR) 20 MG tablet TAKE 1 TABLET BY MOUTH DAILY  . zolpidem (AMBIEN) 10 MG tablet Take 10 mg by mouth at bedtime as needed for sleep.   . [DISCONTINUED] levothyroxine (SYNTHROID) 150 MCG tablet Take 175 mcg by mouth daily before breakfast.  . [DISCONTINUED] NASACORT ALLERGY 24HR 55 MCG/ACT AERO nasal inhaler SMARTSIG:2 Spray(s) Both Nares Every Night     Allergies:   Gemfibrozil, Other, Propranolol, Niaspan [niacin er], and Simvastatin   Social History   Socioeconomic History  . Marital status: Married    Spouse name: Not on file  . Number of children: Not on file  . Years of education: Not on file  . Highest education level: Not on file  Occupational History  . Not on file  Tobacco Use  . Smoking status: Former Smoker    Packs/day: 1.00    Years: 3.00    Pack years: 3.00    Types: Cigarettes    Quit date: 02/01/1967    Years since quitting: 53.3  . Smokeless tobacco: Never Used  Vaping Use  . Vaping Use: Never used  Substance and Sexual Activity  . Alcohol use: Yes    Comment: a drink before dinner a few times a week  . Drug use: No  . Sexual activity: Not on file  Other Topics Concern  . Not on file  Social History Narrative  . Not on file   Social Determinants of Health   Financial Resource Strain: Not on file  Food Insecurity: Not on file  Transportation Needs: Not on file  Physical Activity: Not on file  Stress: Not on file  Social Connections: Not on file     Family History: The patient's family history includes Cancer in his paternal uncle and paternal uncle; Colon cancer (age of onset: 30) in his cousin; Crohn's disease in his sister; Healthy in his sister; Heart attack (age of onset: 85) in his father; Stomach cancer in his  paternal uncle; Throat cancer in his paternal uncle; Thyroid cancer in his daughter.  ROS:   Please see the history of present illness.     All other systems reviewed and are negative.  EKGs/Labs/Other Studies Reviewed:    The following studies were reviewed today: Echo pending on Monday.  Previously moderate aortic regurgitation    Recent Labs: 02/02/2020: ALT 17; BUN 19; Creatinine 0.84; Hemoglobin 15.4; Platelet Count 176; Potassium 4.1; Sodium 144  Recent Lipid Panel    Component Value Date/Time   CHOL 151 01/21/2017 0840   TRIG 177 (H) 01/21/2017 0840   HDL 48 01/21/2017 0840   CHOLHDL 3.1  01/21/2017 0840   LDLCALC 68 01/21/2017 0840     Risk Assessment/Calculations:      Physical Exam:    VS:  BP 122/62   Pulse 66   Ht 5\' 11"  (1.803 m)   Wt 166 lb (75.3 kg)   SpO2 98%   BMI 23.15 kg/m     Wt Readings from Last 3 Encounters:  05/19/20 166 lb (75.3 kg)  03/03/20 172 lb (78 kg)  02/17/20 172 lb 14.4 oz (78.4 kg)     GEN:  Well nourished, well developed in no acute distress HEENT: Normal NECK: No JVD; No carotid bruits LYMPHATICS: No lymphadenopathy CARDIAC: RRR, 1/6 DM,no rubs, gallops RESPIRATORY:  Clear to auscultation without rales, wheezing or rhonchi  ABDOMEN: Soft, non-tender, non-distended MUSCULOSKELETAL:  No edema; No deformity  SKIN: Warm and dry NEUROLOGIC:  Alert and oriented x 3 PSYCHIATRIC:  Normal affect   ASSESSMENT:    1. Coronary artery disease involving native coronary artery of native heart without angina pectoris   2. Adenocarcinoma of lung, stage 1, unspecified laterality ([place])    PLAN:    In order of problems listed above:  Low energy - Taking a Crestor holiday.  If after 2 weeks, no significant change, it is not the Crestor.  I am glad that he saw endocrinology.  I did recommend that he establish again with primary care physician for further evaluation of low energy. -Continue with exercise.  Coronary artery disease -  Prior RCA stent on cardiac catheterization reviewed.  He is not having any chest pain.  Continue with aspirin.  Essential hypertension - Continue with medical management with amlodipine 10 mg once a day.  Also on losartan 100 mg a day.  No changes made.   Moderate aortic valve regurgitation - Monitoring with echocardiogram.  Has 1 on Monday.  He is not having any significant shortness of breath.  Stage I adenocarcinoma of the lung postresection by Dr. Servando Snare.  Stable.  Hyperlipidemia - Currently on Crestor holiday because of lack of energy, some leg discomfort posterior.  He does have excellent pedal pulses.  Prior LDL 50.      Medication Adjustments/Labs and Tests Ordered: Current medicines are reviewed at length with the patient today.  Concerns regarding medicines are outlined above.  No orders of the defined types were placed in this encounter.  No orders of the defined types were placed in this encounter.   Patient Instructions  Medication Instructions:  The current medical regimen is effective;  continue present plan and medications.  *If you need a refill on your cardiac medications before your next appointment, please call your pharmacy*  Follow-Up: At [facility], you and your health needs are our priority.  As part of our continuing mission to provide you with exceptional heart care, we have created designated Provider Care Teams.  These Care Teams include your primary Cardiologist (physician) and Advanced Practice Providers (APPs -  Physician Assistants and Nurse Practitioners) who all work together to provide you with the care you need, when you need it.  We recommend signing up for the patient portal called "MyChart".  Sign up information is provided on this After Visit Summary.  MyChart is used to connect with patients for Virtual Visits (Telemedicine).  Patients are able to view lab/test results, encounter notes, upcoming appointments, etc.  Non-urgent messages  can be sent to your provider as well.   To learn more about what you can do with MyChart, go to [email or web address].  Your next appointment:   Follow up as scheduled with Dr Marlou Porch.  Thank you for choosing [facility]!!         Signed, Candee Furbish, MD  05/19/2020 11:32 AM    [facility]

## 2020-05-23 ENCOUNTER — Ambulatory Visit (HOSPITAL_COMMUNITY): Payer: Medicare Other | Attending: Cardiology

## 2020-05-23 ENCOUNTER — Other Ambulatory Visit: Payer: Self-pay

## 2020-05-23 DIAGNOSIS — I351 Nonrheumatic aortic (valve) insufficiency: Secondary | ICD-10-CM | POA: Diagnosis not present

## 2020-05-23 LAB — ECHOCARDIOGRAM COMPLETE
Area-P 1/2: 2.76 cm2
S' Lateral: 3.7 cm

## 2020-05-27 DIAGNOSIS — K219 Gastro-esophageal reflux disease without esophagitis: Secondary | ICD-10-CM | POA: Diagnosis not present

## 2020-05-27 DIAGNOSIS — G4700 Insomnia, unspecified: Secondary | ICD-10-CM | POA: Diagnosis not present

## 2020-05-27 DIAGNOSIS — E890 Postprocedural hypothyroidism: Secondary | ICD-10-CM | POA: Diagnosis not present

## 2020-05-27 DIAGNOSIS — I10 Essential (primary) hypertension: Secondary | ICD-10-CM | POA: Diagnosis not present

## 2020-05-27 DIAGNOSIS — Z85118 Personal history of other malignant neoplasm of bronchus and lung: Secondary | ICD-10-CM | POA: Diagnosis not present

## 2020-05-27 DIAGNOSIS — I2510 Atherosclerotic heart disease of native coronary artery without angina pectoris: Secondary | ICD-10-CM | POA: Diagnosis not present

## 2020-05-27 DIAGNOSIS — G43109 Migraine with aura, not intractable, without status migrainosus: Secondary | ICD-10-CM | POA: Diagnosis not present

## 2020-05-27 DIAGNOSIS — E782 Mixed hyperlipidemia: Secondary | ICD-10-CM | POA: Diagnosis not present

## 2020-06-10 DIAGNOSIS — Z85850 Personal history of malignant neoplasm of thyroid: Secondary | ICD-10-CM | POA: Diagnosis not present

## 2020-06-10 DIAGNOSIS — R5383 Other fatigue: Secondary | ICD-10-CM | POA: Diagnosis not present

## 2020-06-10 DIAGNOSIS — G4700 Insomnia, unspecified: Secondary | ICD-10-CM | POA: Diagnosis not present

## 2020-06-10 DIAGNOSIS — I10 Essential (primary) hypertension: Secondary | ICD-10-CM | POA: Diagnosis not present

## 2020-06-10 DIAGNOSIS — R7303 Prediabetes: Secondary | ICD-10-CM | POA: Diagnosis not present

## 2020-06-10 DIAGNOSIS — Z85118 Personal history of other malignant neoplasm of bronchus and lung: Secondary | ICD-10-CM | POA: Diagnosis not present

## 2020-06-10 DIAGNOSIS — E782 Mixed hyperlipidemia: Secondary | ICD-10-CM | POA: Diagnosis not present

## 2020-06-10 DIAGNOSIS — E890 Postprocedural hypothyroidism: Secondary | ICD-10-CM | POA: Diagnosis not present

## 2020-06-10 DIAGNOSIS — R42 Dizziness and giddiness: Secondary | ICD-10-CM | POA: Diagnosis not present

## 2020-06-10 DIAGNOSIS — K219 Gastro-esophageal reflux disease without esophagitis: Secondary | ICD-10-CM | POA: Diagnosis not present

## 2020-06-10 DIAGNOSIS — I25111 Atherosclerotic heart disease of native coronary artery with angina pectoris with documented spasm: Secondary | ICD-10-CM | POA: Diagnosis not present

## 2020-06-10 DIAGNOSIS — Z7189 Other specified counseling: Secondary | ICD-10-CM | POA: Diagnosis not present

## 2020-06-14 ENCOUNTER — Encounter: Payer: Self-pay | Admitting: Neurology

## 2020-06-14 DIAGNOSIS — Z23 Encounter for immunization: Secondary | ICD-10-CM | POA: Diagnosis not present

## 2020-06-17 IMAGING — CT CT CHEST WITHOUT CONTRAST
1 of 2 series · 14 of 30 positions shown, 18 images · non-contrast
Comparison: Multiple exams, including 02/26/2018

CLINICAL DATA: Left chest pain.  Follow up lung nodule.

EXAM:
CT CHEST WITHOUT CONTRAST
TECHNIQUE: Multidetector CT imaging of the chest was performed following the
standard protocol without IV contrast.

[Series 2: chest w/(date) · axial · 0.72mm/px · z∈[-414,-88]mm · 14 of 191 slices shown, 18 images]
[im 14/191  mediastinal]
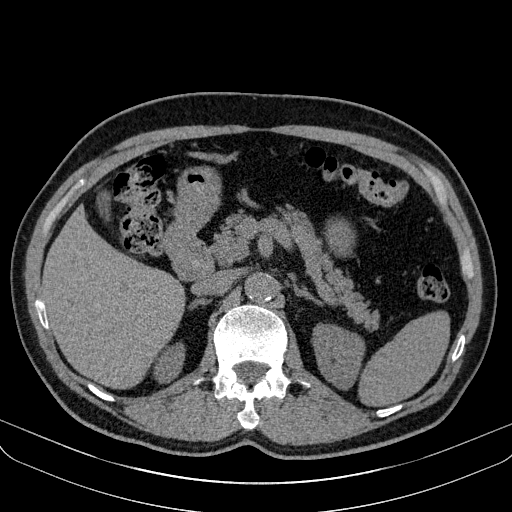
[im 14/191  lung]
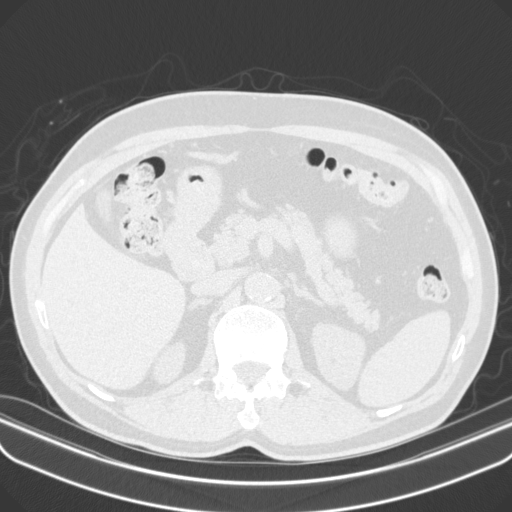
[im 28/191  lung]
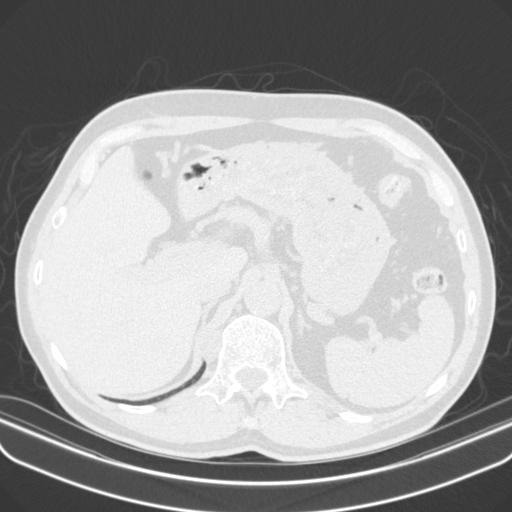
[im 41/191  lung]
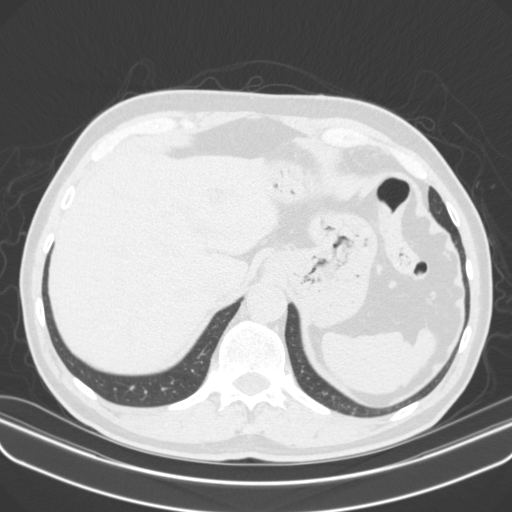
[im 55/191  lung]
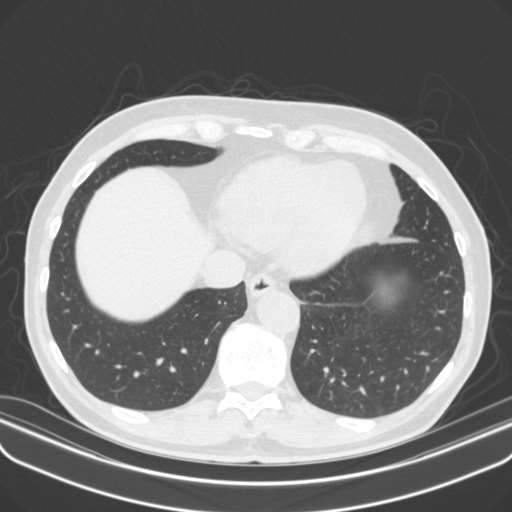
[im 68/191  mediastinal]
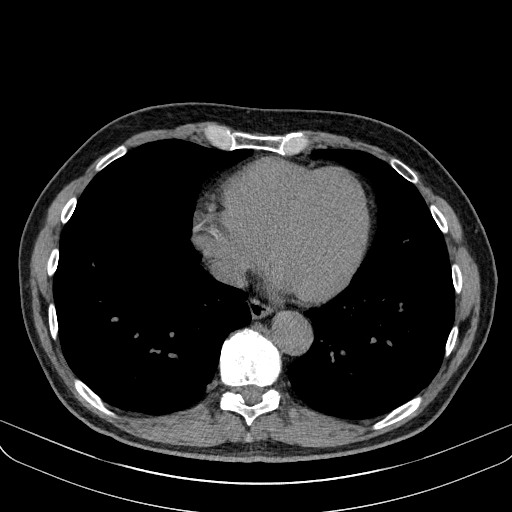
[im 68/191  lung]
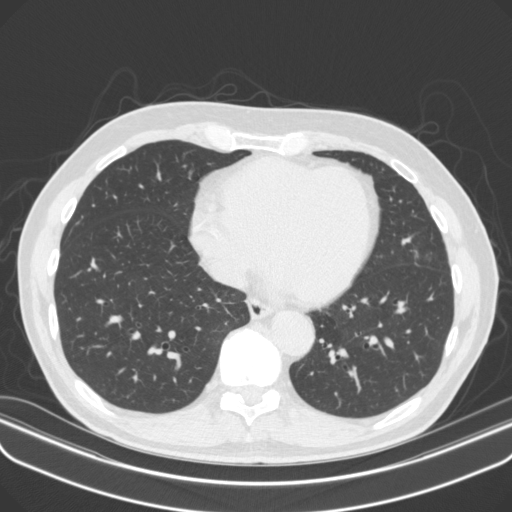
[im 82/191  lung]
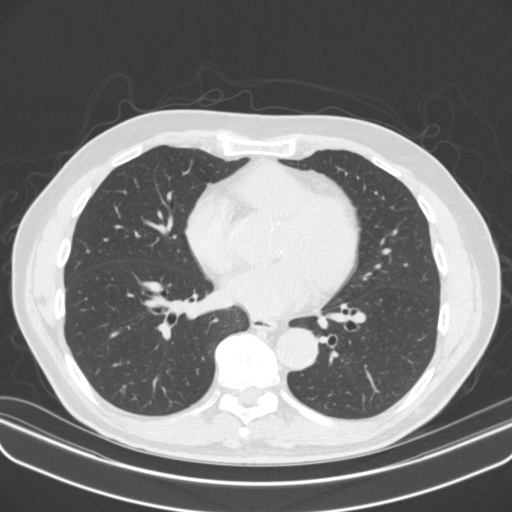
[im 91/191  lung]
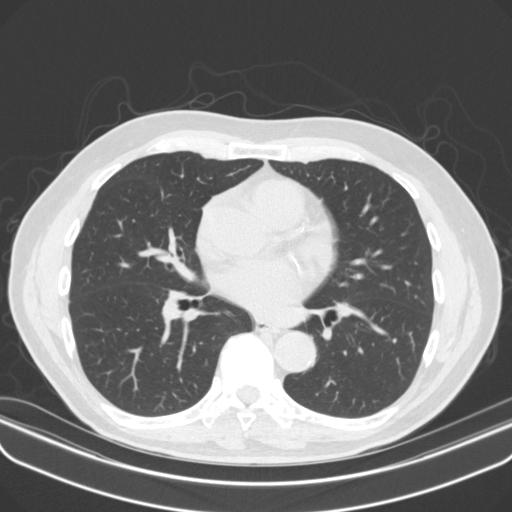
[im 96/191  lung]
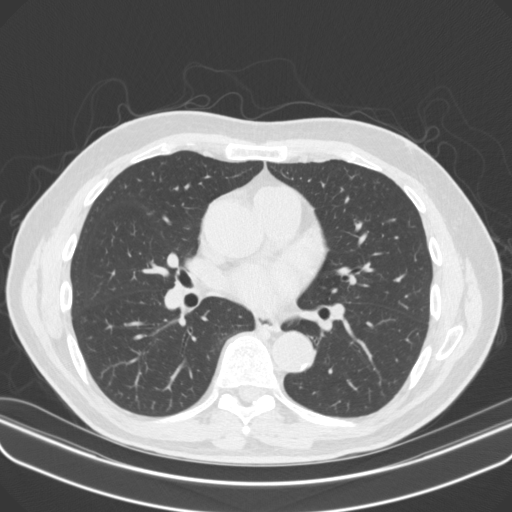
[im 109/191  mediastinal]
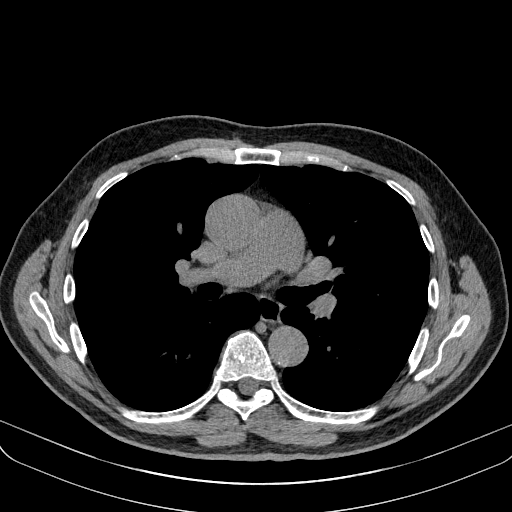
[im 109/191  lung]
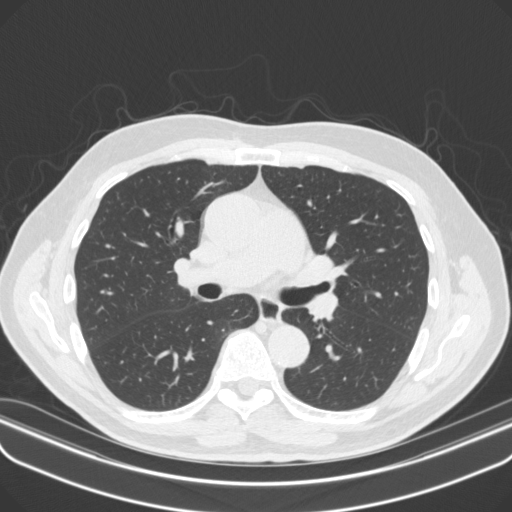
[im 123/191  lung]
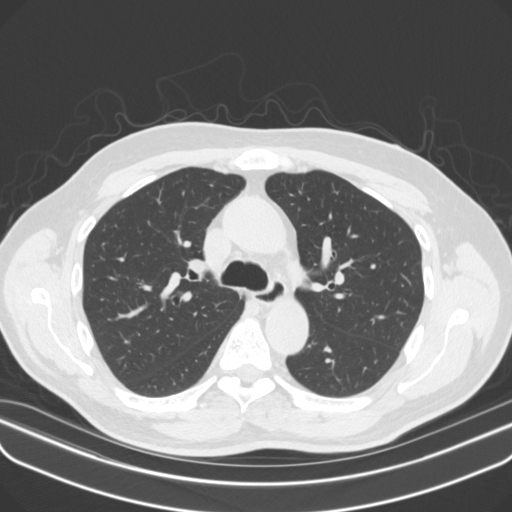
[im 136/191  lung]
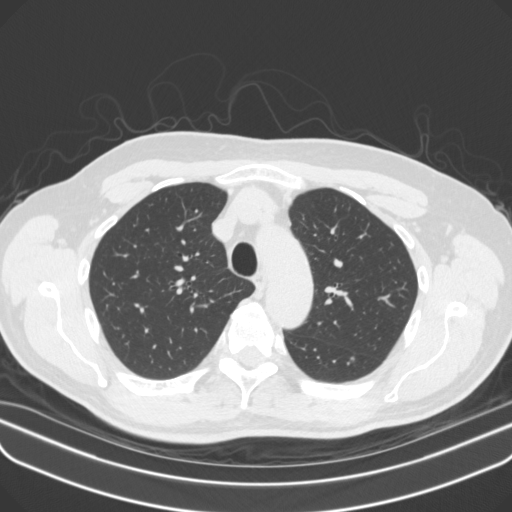
[im 150/191  lung]
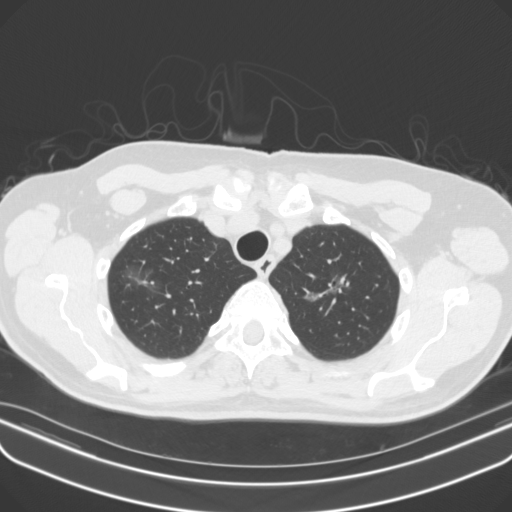
[im 163/191  mediastinal]
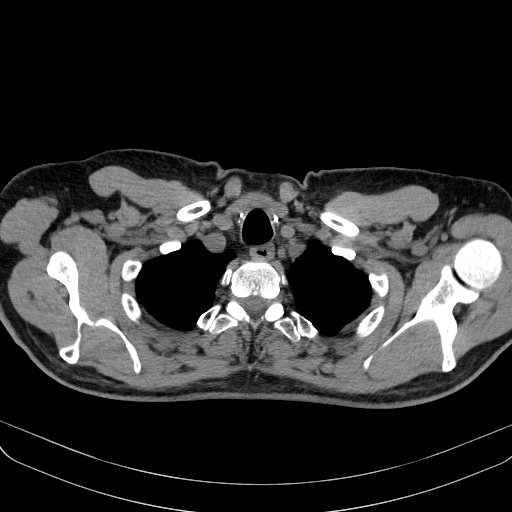
[im 163/191  lung]
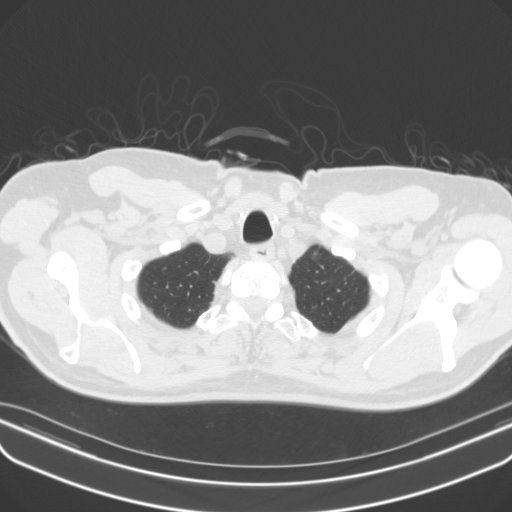
[im 177/191  lung]
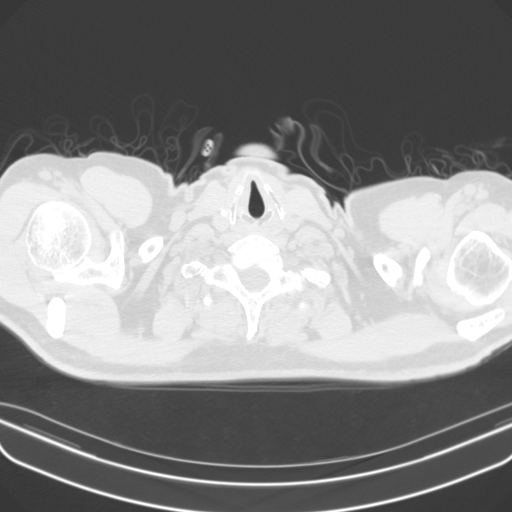

[14 of 30 positions shown; findings below may reference images not displayed]

FINDINGS: Cardiovascular: Atherosclerotic calcification of the aortic arch,
left anterior descending, right left circumflex coronary arteries.

Mediastinum/Nodes: Prior thyroidectomy. No pathologic adenopathy
observed.

Lungs/Pleura: Biapical pleuroparenchymal scarring. Right upper lobe
ground-glass density pulmonary nodule 3.0 cm in long axis,
previously the same on oldest available comparison of 10/23/2017 by
my measurements.

Peribronchovascular nodules in the left upper lobe are present with
a solid nodule measuring 1.3 by 0.9 cm on image 42/3 (formerly the
same on 10/23/2017) and ground-glass density components including a
1.1 cm component on image [DATE] and a 1.3 by 0.5 cm component on
image 32/3, these likewise appear stable from the earliest available
comparison of 10/23/2017.

A 5 mm sub solid nodule in the left upper lobe on image 53/3 is
stable.

Lingular scarring, stable.

Back on 10/23/2017 there was a 10 mm solid-appearing right lower
lobe pulmonary nodule; in this vicinity on today's exam there is
only some faint scarring on image 111/3, that nodule is otherwise
resolved.

Upper Abdomen: The thoracoabdominal junction, a right paraspinal
soft tissue density measuring 2.4 by 1.7 cm has some faint
calcification along its anterior margin. This is immediately
adjacent to the right side of the L1 vertebral body and may slightly
scallop the vertebral body margin although there is intact cortex.
This lesion measured the same on 10/23/2017, and only head low-grade
metabolic activity on prior PET-CT.

Abdominal aortic atherosclerotic calcification is present.

Musculoskeletal: Unremarkable
IMPRESSION: 1. Bilateral upper lobe ground-glass density pulmonary nodules, with
a peribronchovascular solid nodular component in the left upper
lobe. These nodules are stable compared to the earliest available
comparison of 10/23/2017. Low-grade adenocarcinoma not excluded for
the ground-glass density nodules, and the solid component of the
left upper lobe nodule is nonspecific but bronchial carcinoid tumor
not excluded. We have not demonstrated 7 months of stability.
Continued surveillance is recommended.
2. In the right paraspinal space at L1 there is 2.4 by 1.7 cm soft
tissue density oval-shaped nodule with faint anterior calcification
which has low-grade activity but not overtly hypermetabolic activity
at PET-CT. Stable since earliest available comparison of 10/23/2017.
This is probably a benign lesion such as schwannoma or neurofibroma,
but surveillance of this lesion is also suggested.
3.  Aortic Atherosclerosis ([id number]).  Coronary atherosclerosis.
4. Prior thyroidectomy.

## 2020-06-29 IMAGING — CR CHEST - 2 VIEW
2 series · 2 of 2 positions shown · non-contrast
Comparison: 05/28/2018

CLINICAL DATA: Left upper lobe nodule

EXAM:
CHEST - 2 VIEW

[w chest pa]
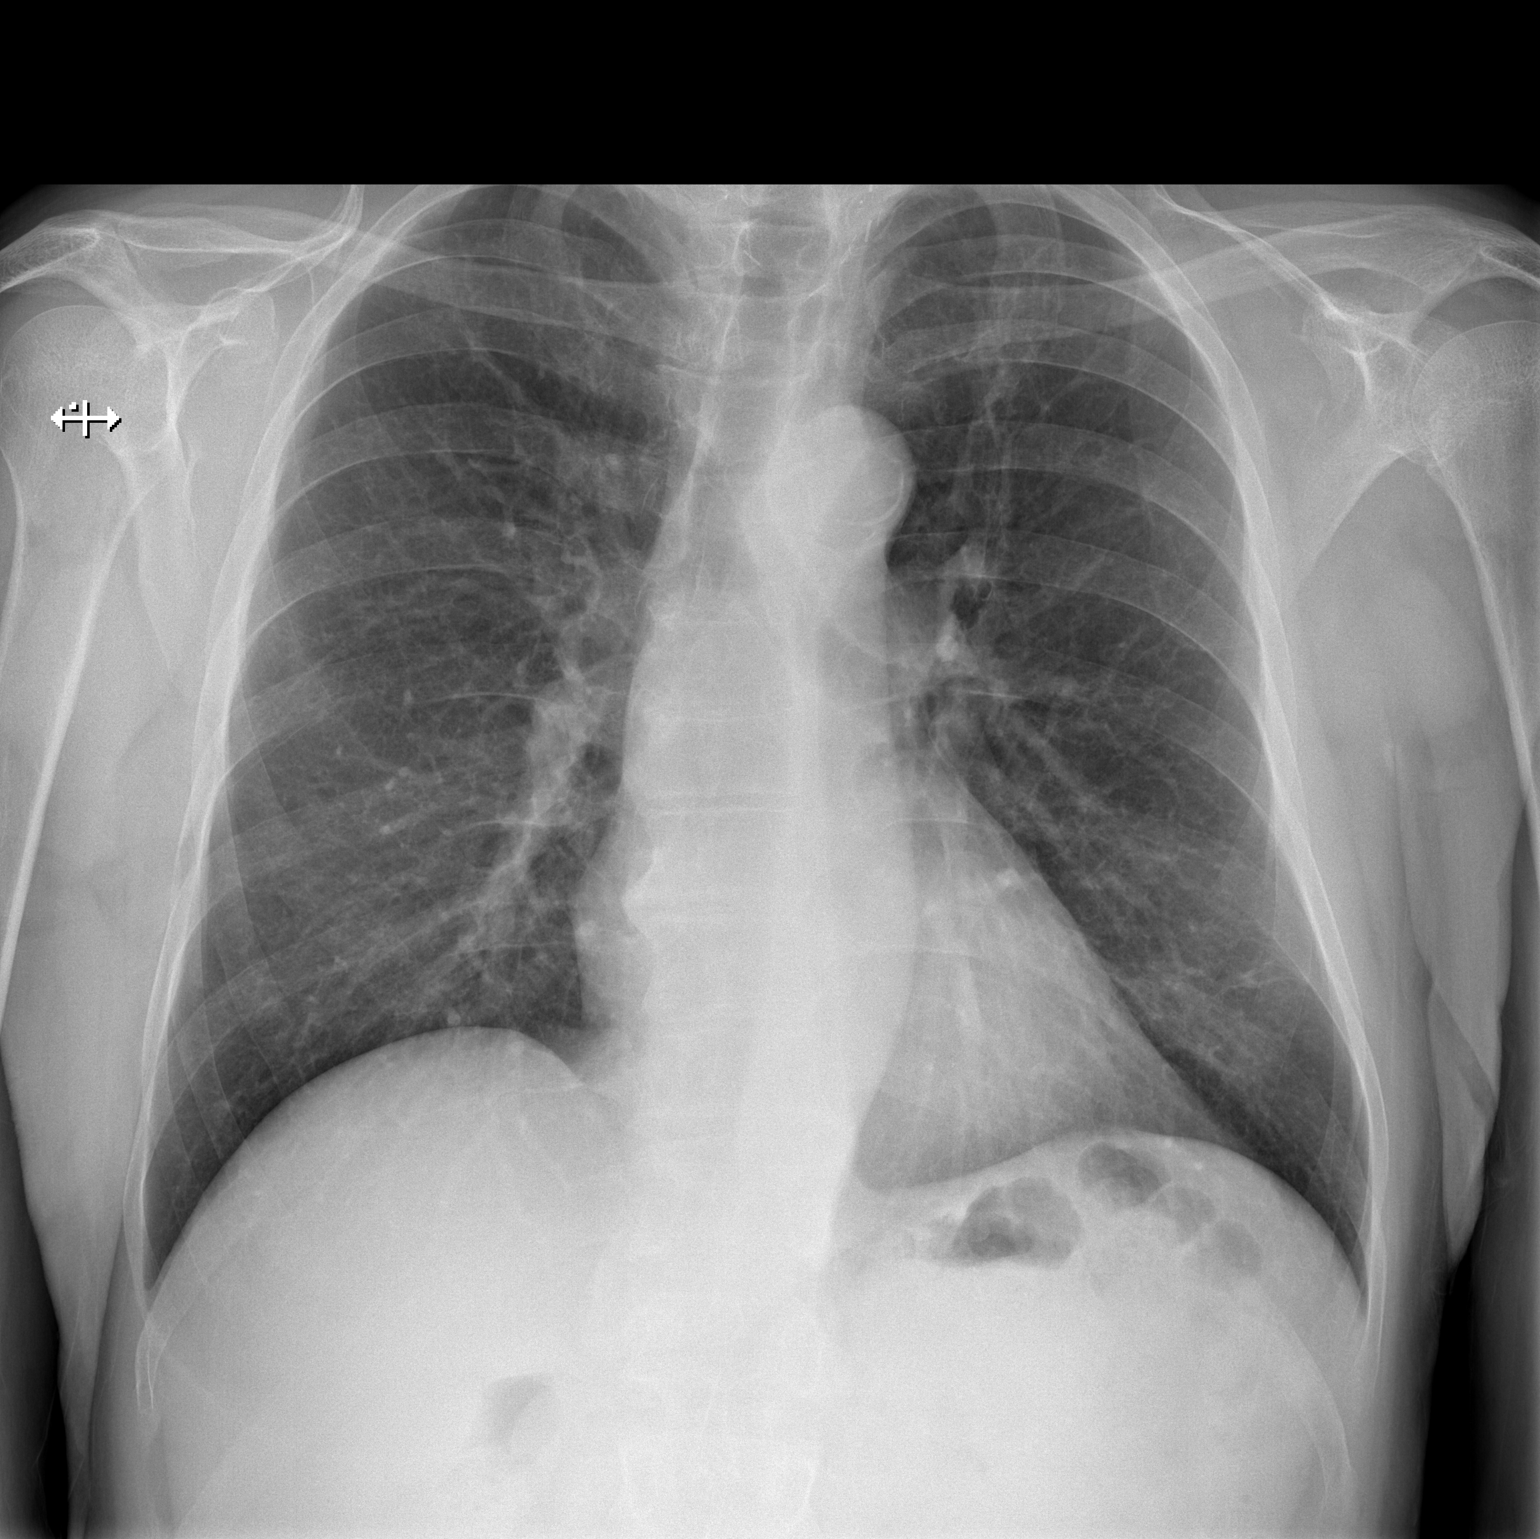

[w chest lat]
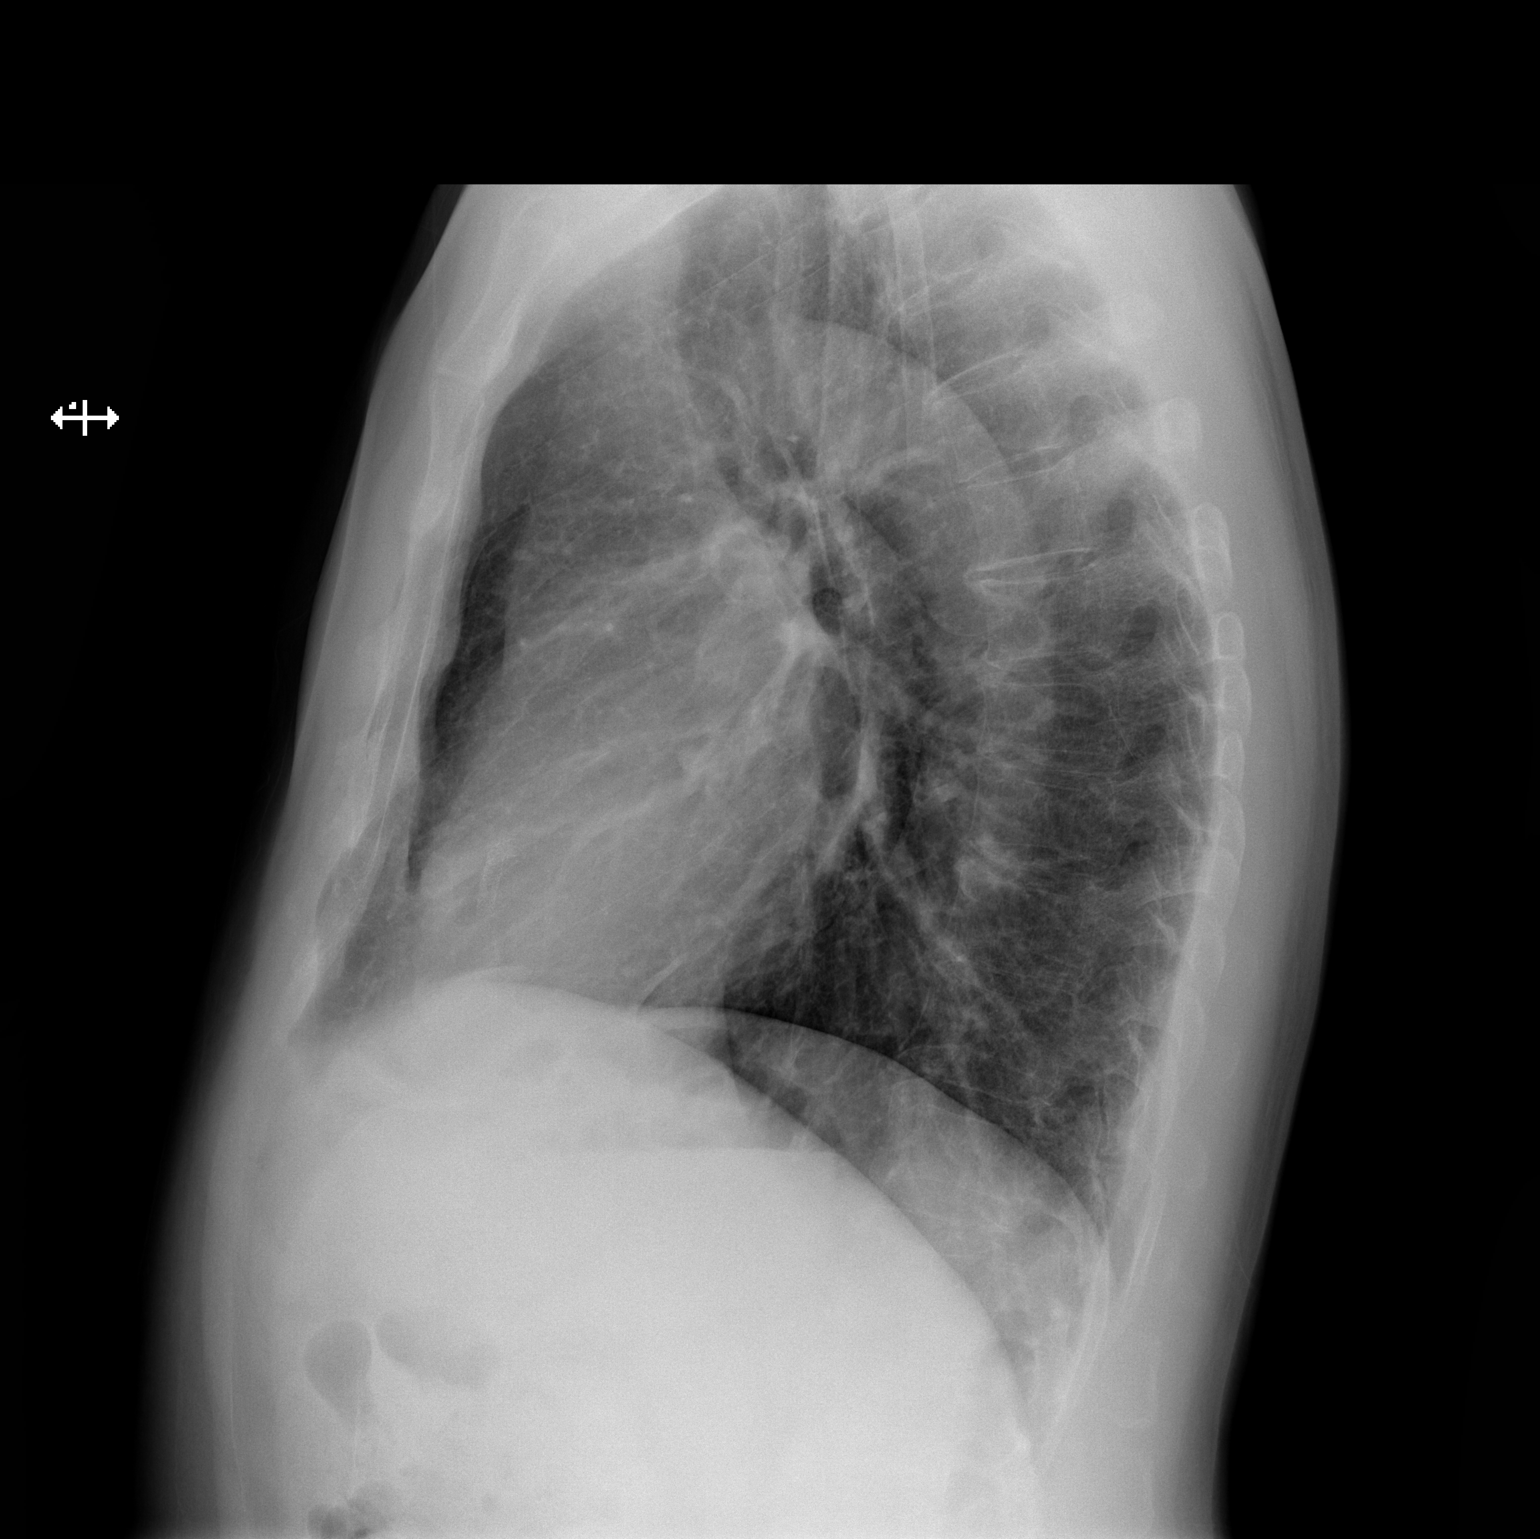

[2 of 2 positions shown; findings below may reference images not displayed]

FINDINGS: Similar hyperinflation. Left upper lobe nodule measuring up to
cm by CT is difficult to visualize by plain radiography and likely
superimposed over the left first anterior rib shadow.

No superimposed pneumonia, collapse or consolidation. Negative for
edema, effusion or pneumothorax. Trachea midline. Normal heart size
and vascularity. Aorta atherosclerotic. Degenerative changes of the
spine.
IMPRESSION: 1.3 cm left upper lobe nodule better demonstrated by CT.

Stable hyperinflation

No superimposed acute process

## 2020-07-01 DIAGNOSIS — Z86018 Personal history of other benign neoplasm: Secondary | ICD-10-CM | POA: Diagnosis not present

## 2020-07-01 DIAGNOSIS — L299 Pruritus, unspecified: Secondary | ICD-10-CM | POA: Diagnosis not present

## 2020-07-01 DIAGNOSIS — Z85828 Personal history of other malignant neoplasm of skin: Secondary | ICD-10-CM | POA: Diagnosis not present

## 2020-07-01 DIAGNOSIS — L578 Other skin changes due to chronic exposure to nonionizing radiation: Secondary | ICD-10-CM | POA: Diagnosis not present

## 2020-07-01 DIAGNOSIS — L570 Actinic keratosis: Secondary | ICD-10-CM | POA: Diagnosis not present

## 2020-07-01 DIAGNOSIS — D225 Melanocytic nevi of trunk: Secondary | ICD-10-CM | POA: Diagnosis not present

## 2020-07-01 DIAGNOSIS — L821 Other seborrheic keratosis: Secondary | ICD-10-CM | POA: Diagnosis not present

## 2020-07-25 DIAGNOSIS — Z20822 Contact with and (suspected) exposure to covid-19: Secondary | ICD-10-CM | POA: Diagnosis not present

## 2020-07-26 ENCOUNTER — Other Ambulatory Visit: Payer: Self-pay

## 2020-07-26 ENCOUNTER — Encounter: Payer: Self-pay | Admitting: Cardiology

## 2020-07-26 ENCOUNTER — Ambulatory Visit (INDEPENDENT_AMBULATORY_CARE_PROVIDER_SITE_OTHER): Payer: Medicare Other | Admitting: Cardiology

## 2020-07-26 VITALS — BP 112/60 | HR 67 | Ht 71.0 in | Wt 167.0 lb

## 2020-07-26 DIAGNOSIS — I351 Nonrheumatic aortic (valve) insufficiency: Secondary | ICD-10-CM | POA: Diagnosis not present

## 2020-07-26 DIAGNOSIS — I2510 Atherosclerotic heart disease of native coronary artery without angina pectoris: Secondary | ICD-10-CM | POA: Diagnosis not present

## 2020-07-26 DIAGNOSIS — E7800 Pure hypercholesterolemia, unspecified: Secondary | ICD-10-CM | POA: Diagnosis not present

## 2020-07-26 MED ORDER — NITROGLYCERIN 0.4 MG SL SUBL: 0.4000 mg | SUBLINGUAL_TABLET | SUBLINGUAL | 4 refills | Status: DC | PRN

## 2020-07-26 MED ORDER — ATORVASTATIN CALCIUM 10 MG PO TABS
10.0000 mg | ORAL_TABLET | Freq: Every day | ORAL | 3 refills | Status: DC
Start: 2020-07-26 — End: 2021-01-25

## 2020-07-26 MED ORDER — AMLODIPINE BESYLATE 5 MG PO TABS
5.0000 mg | ORAL_TABLET | Freq: Every day | ORAL | 3 refills | Status: DC
Start: 2020-07-26 — End: 2021-01-25

## 2020-07-26 NOTE — Patient Instructions (Signed)
Medication Instructions:  Please discontinue Crestor and Amlodipine 10 mg. Start Atorvastatin 10 mg daily and Amlodipine 5 mg daily. Continue all other medications as listed.  *If you need a refill on your cardiac medications before your next appointment, please call your pharmacy*  You have been referred to our [facility] here at [facility].   Follow-Up: At [facility], you and your health needs are our priority.  As part of our continuing mission to provide you with exceptional heart care, we have created designated Provider Care Teams.  These Care Teams include your primary Cardiologist (physician) and Advanced Practice Providers (APPs -  Physician Assistants and Nurse Practitioners) who all work together to provide you with the care you need, when you need it.  We recommend signing up for the patient portal called "MyChart".  Sign up information is provided on this After Visit Summary.  MyChart is used to connect with patients for Virtual Visits (Telemedicine).  Patients are able to view lab/test results, encounter notes, upcoming appointments, etc.  Non-urgent messages can be sent to your provider as well.   To learn more about what you can do with MyChart, go to [email or web address].    Your next appointment:   6 month(s)  The format for your next appointment:   In Person  Provider:   Candee Furbish, MD   Thank you for choosing [facility]!!

## 2020-07-26 NOTE — Progress Notes (Signed)
Cardiology Office Note:    Date:  07/26/2020   ID:  Miguel Wolfe, DOB February 01, 1945, MRN [id number]  PCP:  [name], MD   [facility] Providers Cardiologist:  Candee Furbish, MD     Referring MD: [name], MD     History of Present Illness:    Miguel Wolfe is a 75 y.o. male here for follow-up, low energy.  Since stopping the Crestor he states that he does feel better.  His legs do not hurt him.  He did have an episode of dehydration as described below.  Occasional chest achiness that can last several hours, over days.  Many questions answered today.  Echocardiogram 05/23/2020 shows normal pump function aortic root 40 mm and mild to moderate aortic regurgitation.  No advancement.  Past Medical History:  Diagnosis Date   Anginal pain ([place])    Arthritis    Colon polyps    Coronary artery disease involving native coronary artery of native heart without angina pectoris 04/23/2016   Myoview 02/08/16: EF 51, inf-lat, apical lateral scar with peri-infarct ischemia; intermediate risk  // LHC 2/18: dLAD 20, oD1 20, pLCx 20, pRCA 80, mRCA 20, dRCA 30, AM 90 >> PCI: 4 x 24 mm Synergy DES to pRCA    Family history of adverse reaction to anesthesia    Mom vomits and Dad went "nuts"   Family history of colon cancer    Family history of stomach cancer    Fatigue    GERD (gastroesophageal reflux disease)    Hemorrhoids    History of echocardiogram    Echo 02/08/16: Mild LVH, EF 55-60, no RWMA, Gr 1 DD, trivial AI, mild LAE   Hyperlipidemia    Hypertension    PONV (postoperative nausea and vomiting)    Pre-diabetes    Pulmonary nodules     Past Surgical History:  Procedure Laterality Date   CARDIAC CATHETERIZATION     COLONOSCOPY WITH ESOPHAGOGASTRODUODENOSCOPY (EGD)  2008   CORONARY STENT INTERVENTION N/A 02/20/2016   Procedure: Coronary Stent Intervention;  Surgeon: Burnell Blanks, MD;  Location: [place] [facility];  Service: Cardiovascular;  Laterality: N/A;    ESOPHAGOGASTRODUODENOSCOPY     LEFT HEART CATH AND CORONARY ANGIOGRAPHY N/A 02/20/2016   Procedure: Left Heart Cath and Coronary Angiography;  Surgeon: Burnell Blanks, MD;  Location: [place] [facility];  Service: Cardiovascular;  Laterality: N/A;   PILONIDAL CYCT     REFRACTIVE SURGERY     retina tear repair   THYROIDECTOMY N/A 01/17/2018   Procedure: TOTAL THYROIDECTOMY;  Surgeon: Armandina Gemma, MD;  Location: [facility];  Service: General;  Laterality: N/A;   VIDEO ASSISTED THORACOSCOPY (VATS)/WEDGE RESECTION Left 06/24/2018   Procedure: VIDEO ASSISTED THORACOSCOPY (VATS)/LUNG RESECTION;  Surgeon: Grace Isaac, MD;  Location: [place];  Service: Thoracic;  Laterality: Left;   VIDEO BRONCHOSCOPY WITH ENDOBRONCHIAL NAVIGATION N/A 11/18/2017   Procedure: VIDEO BRONCHOSCOPY WITH ENDOBRONCHIAL NAVIGATION WITH BIOPSIES OF LEFT AND RIGHT UPPER LOBES;  Surgeon: Grace Isaac, MD;  Location: [place];  Service: Thoracic;  Laterality: N/A;   VIDEO BRONCHOSCOPY WITH ENDOBRONCHIAL NAVIGATION N/A 06/11/2018   Procedure: VIDEO BRONCHOSCOPY WITH ENDOBRONCHIAL NAVIGATION;  Surgeon: Grace Isaac, MD;  Location: [place];  Service: Thoracic;  Laterality: N/A;    Current Medications: Current Meds  Medication Sig   acetaminophen (TYLENOL) 325 MG tablet Take 650 mg by mouth every 6 (six) hours as needed for moderate pain or headache.    amLODipine (NORVASC) 5  MG tablet Take 1 tablet (5 mg total) by mouth daily.   aspirin EC 81 MG tablet Take 81 mg by mouth daily.   atorvastatin (LIPITOR) 10 MG tablet Take 1 tablet (10 mg total) by mouth daily.   famotidine (PEPCID) 20 MG tablet Take 20 mg by mouth daily.   ibuprofen (ADVIL,MOTRIN) 200 MG tablet Take 400 mg by mouth every 8 (eight) hours as needed for headache or moderate pain.    levothyroxine (SYNTHROID) 175 MCG tablet Take 175 mcg by mouth daily.   losartan (COZAAR) 100 MG tablet Take 100 mg by mouth daily.   Omega-3 Fatty Acids (FISH OIL) 1000 MG  CAPS Take by mouth.   pantoprazole (PROTONIX) 40 MG tablet Take 40 mg by mouth daily.   propranolol (INDERAL) 20 MG tablet Take 20 mg by mouth daily.   zolpidem (AMBIEN) 10 MG tablet Take 10 mg by mouth at bedtime as needed for sleep.    [DISCONTINUED] amLODipine (NORVASC) 10 MG tablet Take 10 mg by mouth daily.   [DISCONTINUED] nitroGLYCERIN (NITROSTAT) 0.4 MG SL tablet Place 1 tablet (0.4 mg total) under the tongue every 5 (five) minutes as needed for chest pain.   [DISCONTINUED] rosuvastatin (CRESTOR) 20 MG tablet TAKE 1 TABLET BY MOUTH DAILY     Allergies:   Gemfibrozil, Other, Propranolol, Niaspan [niacin er], and Simvastatin   Social History   Socioeconomic History   Marital status: Married    Spouse name: Not on file   Number of children: Not on file   Years of education: Not on file   Highest education level: Not on file  Occupational History   Not on file  Tobacco Use   Smoking status: Former    Packs/day: 1.00    Years: 3.00    Pack years: 3.00    Types: Cigarettes    Quit date: 02/01/1967    Years since quitting: 53.5   Smokeless tobacco: Never  Vaping Use   Vaping Use: Never used  Substance and Sexual Activity   Alcohol use: Yes    Comment: a drink before dinner a few times a week   Drug use: No   Sexual activity: Not on file  Other Topics Concern   Not on file  Social History Narrative   Not on file   Social Determinants of Health   Financial Resource Strain: Not on file  Food Insecurity: Not on file  Transportation Needs: Not on file  Physical Activity: Not on file  Stress: Not on file  Social Connections: Not on file     Family History: The patient's family history includes Cancer in his paternal uncle and paternal uncle; Colon cancer (age of onset: 6) in his cousin; Crohn's disease in his sister; Healthy in his sister; Heart attack (age of onset: 59) in his father; Stomach cancer in his paternal uncle; Throat cancer in his paternal uncle; Thyroid  cancer in his daughter.  ROS:   Please see the history of present illness.     All other systems reviewed and are negative.  EKGs/Labs/Other Studies Reviewed:    The following studies were reviewed today: Echo described as above.  Stable.  Recent Labs: 02/02/2020: ALT 17; BUN 19; Creatinine 0.84; Hemoglobin 15.4; Platelet Count 176; Potassium 4.1; Sodium 144  Recent Lipid Panel    Component Value Date/Time   CHOL 151 01/21/2017 0840   TRIG 177 (H) 01/21/2017 0840   HDL 48 01/21/2017 0840   CHOLHDL 3.1 01/21/2017 0840   LDLCALC 68  01/21/2017 0840     Risk Assessment/Calculations:          Physical Exam:    VS:  BP 112/60 (BP Location: Left Arm, Patient Position: Sitting, Cuff Size: Normal)   Pulse 67   Ht 5\' 11"  (1.803 m)   Wt 167 lb (75.8 kg)   SpO2 97%   BMI 23.29 kg/m     Wt Readings from Last 3 Encounters:  07/26/20 167 lb (75.8 kg)  05/19/20 166 lb (75.3 kg)  03/03/20 172 lb (78 kg)     GEN:  Well nourished, well developed in no acute distress HEENT: Normal NECK: No JVD; No carotid bruits LYMPHATICS: No lymphadenopathy CARDIAC: RRR, no murmurs, rubs, gallops RESPIRATORY:  Clear to auscultation without rales, wheezing or rhonchi  ABDOMEN: Soft, non-tender, non-distended MUSCULOSKELETAL:  No edema; No deformity  SKIN: Warm and dry NEUROLOGIC:  Alert and oriented x 3 PSYCHIATRIC:  Normal affect   ASSESSMENT:    1. Coronary artery disease involving native coronary artery of native heart without angina pectoris   2. Pure hypercholesterolemia   3. Nonrheumatic aortic valve insufficiency    PLAN:    In order of problems listed above:  Low energy - Has seen endocrinology.  After stopping the Crestor, he does state that he has felt better.  Continue with exercise.  Did have 1 episode of what sounds like dehydration following walking the [place].  Blood pressures were soft.  Hydrated and slowly felt better after about 3 hours.  Coronary artery  disease - Prior RCA stent on cardiac catheterization reviewed.  No chest pain no anginal symptoms.  Aspirin.  Goal-directed medical therapy.  Does have some chest achiness which can last several days duration, usually musculoskeletal.  We will go ahead and refill his nitroglycerin.  Essential hypertension - On amlodipine 10 mg once a day and losartan 100 mg once a day.  We will decrease his amlodipine to 5 mg once a day for medical management with refills as needed.  His blood pressures have been low normal.  He will continue to monitor at home.  Mild to moderate aortic valve regurgitation - Recent echocardiogram reviewed from May 2022.  Reassuring.  No significant changes.  Mild to moderate.  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 Servando Snare.  Stable.  Followed by oncology.  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  He is off of this medication.  Most recent LDL 50. Feels better, improved. Leg pain has gone. - We will go ahead and restart a statin, atorvastatin 10 mg once a day.  He has taken pravastatin in the past but I would like to try a more higher intensity approach given his coronary artery disease. - I will have him sit down with our lipid clinic as well to track.  May need alternative therapy.     Medication Adjustments/Labs and Tests Ordered: Current medicines are reviewed at length with the patient today.  Concerns regarding medicines are outlined above.  Orders Placed This Encounter  Procedures   AMB Referral to [facility] Pharm-D   Meds ordered this encounter  Medications   nitroGLYCERIN (NITROSTAT) 0.4 MG SL tablet    Sig: Place 1 tablet (0.4 mg total) under the tongue every 5 (five) minutes as needed for chest pain.    Dispense:  25 tablet    Refill:  4   atorvastatin (LIPITOR) 10 MG tablet    Sig: Take 1 tablet (10 mg total) by mouth daily.    Dispense:  90 tablet    Refill:  3   amLODipine (NORVASC) 5 MG tablet    Sig: Take 1 tablet (5 mg total)  by mouth daily.    Dispense:  90 tablet    Refill:  3    Patient Instructions  Medication Instructions:  Please discontinue Crestor and Amlodipine 10 mg. Start Atorvastatin 10 mg daily and Amlodipine 5 mg daily. Continue all other medications as listed.  *If you need a refill on your cardiac medications before your next appointment, please call your pharmacy*  You have been referred to our [place] [facility] here at [facility].   Follow-Up: At [facility], you and your health needs are our priority.  As part of our continuing mission to provide you with exceptional heart care, we have created designated Provider Care Teams.  These Care Teams include your primary Cardiologist (physician) and Advanced Practice Providers (APPs -  Physician Assistants and Nurse Practitioners) who all work together to provide you with the care you need, when you need it.  We recommend signing up for the patient portal called "MyChart".  Sign up information is provided on this After Visit Summary.  MyChart is used to connect with patients for Virtual Visits (Telemedicine).  Patients are able to view lab/test results, encounter notes, upcoming appointments, etc.  Non-urgent messages can be sent to your provider as well.   To learn more about what you can do with MyChart, go to [email or web address].    Your next appointment:   6 month(s)  The format for your next appointment:   In Person  Provider:   Candee Furbish, MD   Thank you for choosing [facility]!!     Signed, Candee Furbish, MD  07/26/2020 10:54 AM    [place]

## 2020-08-02 DIAGNOSIS — H40013 Open angle with borderline findings, low risk, bilateral: Secondary | ICD-10-CM | POA: Diagnosis not present

## 2020-08-15 NOTE — Progress Notes (Signed)
Patient ID: Miguel Wolfe                 DOB: May 23, 1945                    MRN: [id number]     HPI: Miguel Wolfe is a 75 y.o. male patient referred to lipid clinic by Dr. Marlou Porch. PMH is significant for HLD, HTN, pre-diabetes, CAD with DES to [facility] 02/2016, mild to moderate aortic valve regurgitation (last echo from May 2022 - stable). Reported lack of energy and leg discomfort which resolved with rosuvastatin holiday and was switched to atorvastatin 10 mg daily at last visit 07/26/20.   Today, patient arrives in good spirits. Reports he previously tolerated pravastatin but once he had his stent he was switched to rosuvastatin which he started in 2018. He experienced low energy and some muscle aches but did not realize until recently that it could be related to his medication. He stopped it for about two months and it was night and day from how he felt on rosuvastatin, leg pain resolved and energy came back. He has been on atorvastatin for two weeks now and denies muscle pain, reports energy is still doing well. He did wake up hot/sweaty a couple of nights since starting it but does not know if this is due to the medication. Reports BP 120s-130s/70s since decreasing amlodipine to 5 mg daily at last visit. Eats a healthy diet and stays active.   Current Medications: atorvastatin 10 mg daily Intolerances: rosuvastatin 20 mg daily (myalgias, low energy) Risk Factors: CAD, HLD, HTN, Fhx ASCVD LDL goal: <70 mg/dL  Diet:  Breakfast: Cheerios with blueberries, scoop of protein powder, coffee Lunch: sandwich (tomato, [place], provolone), handful of chips Dinner: Lot of chicken, some beef, not enough fish (wife doesn't like), always have at least 1 vegetable, starch (rice) Snacks/desserts: Cheese and crackers with a drink at 4-5 pm  Exercise: Mows yard, wood working, plays golf, walks a few times per week with wife  Family History: Heart attack (age of onset: 33) in his father  Social History: Former  smoker (quit 1969)  Labs: 10/27/19: TC 105, TG 90, HDL 37, LDL 50, A1c 5.9 (rosuvastatin 20mg  daily)  Past Medical History:  Diagnosis Date   Anginal pain ([place])    Arthritis    Colon polyps    Coronary artery disease involving native coronary artery of native heart without angina pectoris 04/23/2016   Myoview 02/08/16: EF 51, inf-lat, apical lateral scar with peri-infarct ischemia; intermediate risk  // LHC 2/18: dLAD 20, oD1 20, pLCx 20, pRCA 80, mRCA 20, dRCA 30, AM 90 >> PCI: 4 x 24 mm Synergy DES to pRCA    Family history of adverse reaction to anesthesia    Mom vomits and Dad went "nuts"   Family history of colon cancer    Family history of stomach cancer    Fatigue    GERD (gastroesophageal reflux disease)    Hemorrhoids    History of echocardiogram    Echo 02/08/16: Mild LVH, EF 55-60, no RWMA, Gr 1 DD, trivial AI, mild LAE   Hyperlipidemia    Hypertension    PONV (postoperative nausea and vomiting)    Pre-diabetes    Pulmonary nodules     Current Outpatient Medications on File Prior to Visit  Medication Sig Dispense Refill   acetaminophen (TYLENOL) 325 MG tablet Take 650 mg by mouth every 6 (six) hours as needed for moderate pain or  headache.      amLODipine (NORVASC) 5 MG tablet Take 1 tablet (5 mg total) by mouth daily. 90 tablet 3   aspirin EC 81 MG tablet Take 81 mg by mouth daily.     atorvastatin (LIPITOR) 10 MG tablet Take 1 tablet (10 mg total) by mouth daily. 90 tablet 3   famotidine (PEPCID) 20 MG tablet Take 20 mg by mouth daily.     ibuprofen (ADVIL,MOTRIN) 200 MG tablet Take 400 mg by mouth every 8 (eight) hours as needed for headache or moderate pain.      levothyroxine (SYNTHROID) 175 MCG tablet Take 175 mcg by mouth daily.     losartan (COZAAR) 100 MG tablet Take 100 mg by mouth daily.     nitroGLYCERIN (NITROSTAT) 0.4 MG SL tablet Place 1 tablet (0.4 mg total) under the tongue every 5 (five) minutes as needed for chest pain. 25 tablet 4   Omega-3 Fatty  Acids (FISH OIL) 1000 MG CAPS Take by mouth.     pantoprazole (PROTONIX) 40 MG tablet Take 40 mg by mouth daily.     propranolol (INDERAL) 20 MG tablet Take 20 mg by mouth daily.  2   zolpidem (AMBIEN) 10 MG tablet Take 10 mg by mouth at bedtime as needed for sleep.   0   No current facility-administered medications on file prior to visit.    Allergies  Allergen Reactions   Gemfibrozil Other (See Comments)    Muscle pain   Other Other (See Comments)   Propranolol     Other reaction(s): SOB  Only had issue with Slow release propranolol   Niaspan [Niacin Er] Rash and Other (See Comments)    Flushing - aspirin did not mitigate    Simvastatin Other (See Comments)    Drug-Drug interaction with amlodipine    Assessment/Plan:  1. Hyperlipidemia - Most recent LDL is at goal <70 mg/dL however this panel was drawn when he was on rosuvastatin 20 mg daily. Unclear what his LDL baseline was prior to starting rosuvastatin. He has now been on atorvastatin for 2 weeks and tolerating it well. Discussed difference between high-intensity dosing he was on with rosuvastatin versus moderate-intensity dosing now and that he may need dose increases and/or addition of another agent depending on his tolerability to keep him below his goal LDL. Discussed increasing dose today to 20 mg to see if he tolerates it. He would like to wait and see what his LDL is on the 10 mg dose and then make adjustments as needed. Will continue atorvastatin 10 mg daily for now and check lipid panel and LFTs after he has been on atorvastatin for 6 weeks. Depending on what LDL is in relation to goal, could increase atorvastatin (would expect ~6% further decrease in LDL with each dose increase) or add ezetimibe (would expect ~20% decrease in LDL). Encouraged patient to keep up the good work with his healthy diet and exercise.   Rebbeca Paul, PharmD PGY2 Ambulatory Care Pharmacy Resident 08/16/2020 10:18 AM

## 2020-08-16 ENCOUNTER — Ambulatory Visit (INDEPENDENT_AMBULATORY_CARE_PROVIDER_SITE_OTHER): Payer: Medicare Other | Admitting: Student-PharmD

## 2020-08-16 ENCOUNTER — Other Ambulatory Visit: Payer: Medicare Other

## 2020-08-16 ENCOUNTER — Other Ambulatory Visit: Payer: Self-pay

## 2020-08-16 DIAGNOSIS — E7800 Pure hypercholesterolemia, unspecified: Secondary | ICD-10-CM | POA: Diagnosis not present

## 2020-08-16 DIAGNOSIS — I2510 Atherosclerotic heart disease of native coronary artery without angina pectoris: Secondary | ICD-10-CM

## 2020-08-16 NOTE — Patient Instructions (Signed)
Nice to see you today!  Keep up the good work with diet and exercise. Aim for a diet full of vegetables, fruit and lean meats (chicken, [place], fish). Try to limit carbs (bread, pasta, sugar, rice) and red meat consumption.  Your goal LDL is less than 70 mg/dL, you're currently at 50 mg/dL (when you were on Crestor)  Medication Changes: Continue atorvastatin 10 mg daily  We will check fasting labs in 1 month and then I will call you with the results. We may need to increase your statin or add another medication like ezetimibe depending on how far from your goal you are.   Please give [place] a call at [phone] with any questions or concerns.

## 2020-08-18 ENCOUNTER — Ambulatory Visit: Payer: Medicare Other | Admitting: Internal Medicine

## 2020-08-19 DIAGNOSIS — Z20822 Contact with and (suspected) exposure to covid-19: Secondary | ICD-10-CM | POA: Diagnosis not present

## 2020-08-22 ENCOUNTER — Encounter (HOSPITAL_COMMUNITY): Payer: Self-pay

## 2020-08-22 ENCOUNTER — Ambulatory Visit (HOSPITAL_COMMUNITY)
Admission: RE | Admit: 2020-08-22 | Discharge: 2020-08-22 | Disposition: A | Discharge status: Home / Self Care | Payer: Medicare Other | Source: Ambulatory Visit | Attending: Internal Medicine | Admitting: Internal Medicine

## 2020-08-22 ENCOUNTER — Inpatient Hospital Stay: Payer: Medicare Other | Attending: Internal Medicine

## 2020-08-22 ENCOUNTER — Other Ambulatory Visit: Payer: Self-pay

## 2020-08-22 DIAGNOSIS — C3490 Malignant neoplasm of unspecified part of unspecified bronchus or lung: Secondary | ICD-10-CM

## 2020-08-22 DIAGNOSIS — Z8719 Personal history of other diseases of the digestive system: Secondary | ICD-10-CM | POA: Diagnosis not present

## 2020-08-22 DIAGNOSIS — Z800 Family history of malignant neoplasm of digestive organs: Secondary | ICD-10-CM | POA: Insufficient documentation

## 2020-08-22 DIAGNOSIS — Z79899 Other long term (current) drug therapy: Secondary | ICD-10-CM | POA: Diagnosis not present

## 2020-08-22 DIAGNOSIS — I2510 Atherosclerotic heart disease of native coronary artery without angina pectoris: Secondary | ICD-10-CM | POA: Diagnosis not present

## 2020-08-22 DIAGNOSIS — R911 Solitary pulmonary nodule: Secondary | ICD-10-CM | POA: Diagnosis not present

## 2020-08-22 DIAGNOSIS — C3412 Malignant neoplasm of upper lobe, left bronchus or lung: Secondary | ICD-10-CM | POA: Insufficient documentation

## 2020-08-22 DIAGNOSIS — Z902 Acquired absence of lung [part of]: Secondary | ICD-10-CM | POA: Diagnosis not present

## 2020-08-22 DIAGNOSIS — M545 Low back pain, unspecified: Secondary | ICD-10-CM | POA: Insufficient documentation

## 2020-08-22 DIAGNOSIS — I700 Atherosclerosis of aorta: Secondary | ICD-10-CM | POA: Diagnosis not present

## 2020-08-22 DIAGNOSIS — E785 Hyperlipidemia, unspecified: Secondary | ICD-10-CM | POA: Insufficient documentation

## 2020-08-22 DIAGNOSIS — I10 Essential (primary) hypertension: Secondary | ICD-10-CM | POA: Insufficient documentation

## 2020-08-22 DIAGNOSIS — K219 Gastro-esophageal reflux disease without esophagitis: Secondary | ICD-10-CM | POA: Diagnosis not present

## 2020-08-22 LAB — CMP (CANCER CENTER ONLY)
ALT: 15 U/L (ref 0–44)
AST: 20 U/L (ref 15–41)
Albumin: 4.3 g/dL (ref 3.5–5.0)
Alkaline Phosphatase: 67 U/L (ref 38–126)
Anion gap: 10 (ref 5–15)
BUN: 15 mg/dL (ref 8–23)
CO2: 29 mmol/L (ref 22–32)
Calcium: 9 mg/dL (ref 8.9–10.3)
Chloride: 104 mmol/L (ref 98–111)
Creatinine: 0.85 mg/dL (ref 0.61–1.24)
GFR, Estimated: >60 mL/min (ref >60)
Glucose, Bld: 103 mg/dL — ABNORMAL HIGH (ref 70–99)
Potassium: 3.9 mmol/L (ref 3.5–5.1)
Sodium: 143 mmol/L (ref 135–145)
Total Bilirubin: 1 mg/dL (ref 0.3–1.2)
Total Protein: 6.9 g/dL (ref 6.5–8.1)

## 2020-08-22 LAB — CBC WITH DIFFERENTIAL (CANCER CENTER ONLY)
Abs Immature Granulocytes: 0.01 10*3/uL (ref 0.00–0.07)
Basophils Absolute: 0 10*3/uL (ref 0.0–0.1)
Basophils Relative: 1 %
Eosinophils Absolute: 0.4 10*3/uL (ref 0.0–0.5)
Eosinophils Relative: 5 %
HCT: 45.6 % (ref 39.0–52.0)
Hemoglobin: 15.7 g/dL (ref 13.0–17.0)
Immature Granulocytes: 0 %
Lymphocytes Relative: 34 %
Lymphs Abs: 2.3 10*3/uL (ref 0.7–4.0)
MCH: 30.7 pg (ref 26.0–34.0)
MCHC: 34.4 g/dL (ref 30.0–36.0)
MCV: 89.2 fL (ref 80.0–100.0)
Monocytes Absolute: 0.7 10*3/uL (ref 0.1–1.0)
Monocytes Relative: 10 %
Neutro Abs: 3.3 10*3/uL (ref 1.7–7.7)
Neutrophils Relative %: 50 %
Platelet Count: 165 10*3/uL (ref 150–400)
RBC: 5.11 MIL/uL (ref 4.22–5.81)
RDW: 12.6 % (ref 11.5–15.5)
WBC Count: 6.7 10*3/uL (ref 4.0–10.5)
nRBC: 0 % (ref 0.0–0.2)

## 2020-08-22 MED ORDER — IOHEXOL 350 MG/ML SOLN
60.0000 mL | Freq: Once | INTRAVENOUS | Status: AC | PRN
  Administered 2020-08-22: 60 mL via INTRAVENOUS

## 2020-08-24 ENCOUNTER — Inpatient Hospital Stay (HOSPITAL_BASED_OUTPATIENT_CLINIC_OR_DEPARTMENT_OTHER): Payer: Medicare Other | Admitting: Internal Medicine

## 2020-08-24 ENCOUNTER — Encounter: Payer: Self-pay | Admitting: Internal Medicine

## 2020-08-24 ENCOUNTER — Other Ambulatory Visit: Payer: Self-pay

## 2020-08-24 VITALS — BP 149/85 | HR 66 | Temp 97.4°F | Resp 18 | Ht 71.0 in | Wt 170.9 lb

## 2020-08-24 DIAGNOSIS — C3490 Malignant neoplasm of unspecified part of unspecified bronchus or lung: Secondary | ICD-10-CM | POA: Diagnosis not present

## 2020-08-24 DIAGNOSIS — I700 Atherosclerosis of aorta: Secondary | ICD-10-CM | POA: Diagnosis not present

## 2020-08-24 DIAGNOSIS — C3412 Malignant neoplasm of upper lobe, left bronchus or lung: Secondary | ICD-10-CM | POA: Diagnosis not present

## 2020-08-24 DIAGNOSIS — I10 Essential (primary) hypertension: Secondary | ICD-10-CM | POA: Diagnosis not present

## 2020-08-24 DIAGNOSIS — M545 Low back pain, unspecified: Secondary | ICD-10-CM | POA: Diagnosis not present

## 2020-08-24 DIAGNOSIS — E785 Hyperlipidemia, unspecified: Secondary | ICD-10-CM | POA: Diagnosis not present

## 2020-08-24 DIAGNOSIS — C3411 Malignant neoplasm of upper lobe, right bronchus or lung: Secondary | ICD-10-CM

## 2020-08-24 DIAGNOSIS — I2510 Atherosclerotic heart disease of native coronary artery without angina pectoris: Secondary | ICD-10-CM | POA: Diagnosis not present

## 2020-08-24 NOTE — Progress Notes (Signed)
[place] [phone]   [phone]  OFFICE PROGRESS NOTE  [name], MD [place] [id number]  DIAGNOSIS: Stage IA (T1c, N0, M0) non-small cell lung cancer, adenocarcinoma with no actionable mutation and negative PDL 1 expression diagnosed in June 2020.   PRIOR THERAPY: Status post left upper lobectomy with lymph node dissection on June 24, 2018.  CURRENT THERAPY: Observation.  INTERVAL HISTORY: Miguel Wolfe 75 y.o. male returns to the clinic today for follow-up visit accompanied by his wife Inez Catalina.  The patient is feeling fine today with no concerning complaints except for intermittent low back pain rating as 1-2 on a scale from 1-10.  He denied having any current chest pain, shortness of breath, cough or hemoptysis.  He denied having any nausea, vomiting, diarrhea or constipation.  He has no headache or visual changes.  He has no significant fever or chills.  He had repeat CT scan of the chest performed recently and he is here for evaluation and discussion of his scan results.  MEDICAL HISTORY: Past Medical History:  Diagnosis Date   Anginal pain ([place])    Arthritis    Colon polyps    Coronary artery disease involving native coronary artery of native heart without angina pectoris 04/23/2016   Myoview 02/08/16: EF 51, inf-lat, apical lateral scar with peri-infarct ischemia; intermediate risk  // LHC 2/18: dLAD 20, oD1 20, pLCx 20, pRCA 80, mRCA 20, dRCA 30, AM 90 >> PCI: 4 x 24 mm Synergy DES to pRCA    Family history of adverse reaction to anesthesia    Mom vomits and Dad went "nuts"   Family history of colon cancer    Family history of stomach cancer    Fatigue    GERD (gastroesophageal reflux disease)    Hemorrhoids    History of echocardiogram    Echo 02/08/16: Mild LVH, EF 55-60, no RWMA, Gr 1 DD, trivial AI, mild LAE   Hyperlipidemia    Hypertension    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   PONV (postoperative nausea and  vomiting)    Pre-diabetes    Pulmonary nodules     ALLERGIES:  is allergic to gemfibrozil, other, propranolol, niaspan [niacin er], and simvastatin.  MEDICATIONS:  Current Outpatient Medications  Medication Sig Dispense Refill   acetaminophen (TYLENOL) 325 MG tablet Take 650 mg by mouth every 6 (six) hours as needed for moderate pain or headache.      amLODipine (NORVASC) 5 MG tablet Take 1 tablet (5 mg total) by mouth daily. 90 tablet 3   aspirin EC 81 MG tablet Take 81 mg by mouth daily.     atorvastatin (LIPITOR) 10 MG tablet Take 1 tablet (10 mg total) by mouth daily. 90 tablet 3   famotidine (PEPCID) 20 MG tablet Take 20 mg by mouth daily.     ibuprofen (ADVIL,MOTRIN) 200 MG tablet Take 400 mg by mouth every 8 (eight) hours as needed for headache or moderate pain.      levothyroxine (SYNTHROID) 175 MCG tablet Take 175 mcg by mouth daily.     losartan (COZAAR) 100 MG tablet Take 100 mg by mouth daily.     nitroGLYCERIN (NITROSTAT) 0.4 MG SL tablet Place 1 tablet (0.4 mg total) under the tongue every 5 (five) minutes as needed for chest pain. 25 tablet 4   Omega-3 Fatty Acids (FISH OIL) 1000 MG CAPS Take by mouth.     pantoprazole (PROTONIX) 40  MG tablet Take 40 mg by mouth daily.     propranolol (INDERAL) 20 MG tablet Take 20 mg by mouth daily.  2   zolpidem (AMBIEN) 10 MG tablet Take 10 mg by mouth at bedtime as needed for sleep.   0   No current facility-administered medications for this visit.    SURGICAL HISTORY:  Past Surgical History:  Procedure Laterality Date   CARDIAC CATHETERIZATION     COLONOSCOPY WITH ESOPHAGOGASTRODUODENOSCOPY (EGD)  2008   CORONARY STENT INTERVENTION N/A 02/20/2016   Procedure: Coronary Stent Intervention;  Surgeon: Burnell Blanks, MD;  Location: [facility];  Service: Cardiovascular;  Laterality: N/A;   ESOPHAGOGASTRODUODENOSCOPY     LEFT HEART CATH AND CORONARY ANGIOGRAPHY N/A 02/20/2016   Procedure: Left Heart Cath and Coronary  Angiography;  Surgeon: Burnell Blanks, MD;  Location: [facility];  Service: Cardiovascular;  Laterality: N/A;   PILONIDAL CYCT     REFRACTIVE SURGERY     retina tear repair   THYROIDECTOMY N/A 01/17/2018   Procedure: TOTAL THYROIDECTOMY;  Surgeon: Armandina Gemma, MD;  Location: [facility];  Service: General;  Laterality: N/A;   VIDEO ASSISTED THORACOSCOPY (VATS)/WEDGE RESECTION Left 06/24/2018   Procedure: VIDEO ASSISTED THORACOSCOPY (VATS)/LUNG RESECTION;  Surgeon: Grace Isaac, MD;  Location: [facility];  Service: Thoracic;  Laterality: Left;   VIDEO BRONCHOSCOPY WITH ENDOBRONCHIAL NAVIGATION N/A 11/18/2017   Procedure: VIDEO BRONCHOSCOPY WITH ENDOBRONCHIAL NAVIGATION WITH BIOPSIES OF LEFT AND RIGHT UPPER LOBES;  Surgeon: Grace Isaac, MD;  Location: [facility];  Service: Thoracic;  Laterality: N/A;   VIDEO BRONCHOSCOPY WITH ENDOBRONCHIAL NAVIGATION N/A 06/11/2018   Procedure: VIDEO BRONCHOSCOPY WITH ENDOBRONCHIAL NAVIGATION;  Surgeon: Grace Isaac, MD;  Location: [facility];  Service: Thoracic;  Laterality: N/A;    REVIEW OF SYSTEMS:  Constitutional: negative Eyes: negative Ears, nose, mouth, throat, and face: negative Respiratory: negative Cardiovascular: negative Gastrointestinal: negative Genitourinary:negative Integument/breast: negative Hematologic/lymphatic: negative Musculoskeletal:positive for back pain Neurological: negative Behavioral/Psych: negative Endocrine: negative Allergic/Immunologic: negative   PHYSICAL EXAMINATION: General appearance: alert, cooperative, and no distress Head: Normocephalic, without obvious abnormality, atraumatic Neck: no adenopathy, no JVD, supple, symmetrical, trachea midline, and thyroid not enlarged, symmetric, no tenderness/mass/nodules Lymph nodes: Cervical, supraclavicular, and axillary nodes normal. Resp: clear to auscultation bilaterally Back: symmetric, no curvature. ROM normal. No CVA tenderness. Cardio: regular rate and  rhythm, S1, S2 normal, no murmur, click, rub or gallop GI: soft, non-tender; bowel sounds normal; no masses,  no organomegaly Extremities: extremities normal, atraumatic, no cyanosis or edema Neurologic: Alert and oriented X 3, normal strength and tone. Normal symmetric reflexes. Normal coordination and gait  ECOG PERFORMANCE STATUS: 1 - Symptomatic but completely ambulatory  Blood pressure (!) 149/85, pulse 66, temperature (!) 97.4 F (36.3 C), temperature source Tympanic, resp. rate 18, height 5\' 11"  (1.803 m), weight 170 lb 14.4 oz (77.5 kg), SpO2 98 %.  LABORATORY DATA: Lab Results  Component Value Date   WBC 6.7 08/22/2020   HGB 15.7 08/22/2020   HCT 45.6 08/22/2020   MCV 89.2 08/22/2020   PLT 165 08/22/2020      Chemistry      Component Value Date/Time   NA 143 08/22/2020 0956   NA 143 04/24/2016 0935   K 3.9 08/22/2020 0956   CL 104 08/22/2020 0956   CO2 29 08/22/2020 0956   BUN 15 08/22/2020 0956   BUN 17 04/24/2016 0935   CREATININE 0.85 08/22/2020 0956      Component Value Date/Time  CALCIUM 9.0 08/22/2020 0956   ALKPHOS 67 08/22/2020 0956   AST 20 08/22/2020 0956   ALT 15 08/22/2020 0956   BILITOT 1.0 08/22/2020 0956       RADIOGRAPHIC STUDIES: CT Chest W Contrast  Result Date: 08/22/2020 CLINICAL DATA:  Non-small-cell lung cancer. Restaging. Status post left upper lobectomy. EXAM: CT CHEST WITH CONTRAST TECHNIQUE: Multidetector CT imaging of the chest was performed during intravenous contrast administration. CONTRAST:  76mL OMNIPAQUE IOHEXOL 350 MG/ML SOLN COMPARISON:  02/02/2020 FINDINGS: Cardiovascular: The heart size is normal. No substantial pericardial effusion. Coronary artery calcification is evident. Ascending thoracic aorta measures 4.1 cm diameter. Mediastinum/Nodes: No mediastinal lymphadenopathy. There is no hilar lymphadenopathy. The esophagus has normal imaging features. There is no axillary lymphadenopathy. Lungs/Pleura: Volume loss left  hemithorax compatible with left upper lobectomy. No new suspicious nodule or mass in the left lung. Ground-glass opacity in the right lung apex with internal linear soft tissue components is stable in the interval. No new suspicious nodule or mass in the right lung. No focal airspace consolidation. No pleural effusion. Upper Abdomen: Previously described 2.6 x 1.6 cm right paraspinal soft tissue nodule at the level of L1 is stable. As noted previously, this lesion was not hypermetabolic on prior PET imaging. Musculoskeletal: The lucent L1 vertebral body lesion is progressive in the interval measuring 2.9 x 2.8 cm today compared to 1.9 x 1.4 cm on the prior CT. While this does have a suggestion of central coarsened trabecula, there appears to be disruption of the posterior vertebral body cortex on today's study. IMPRESSION: 1. Interval progression of the lucent L1 vertebral body lesion with possible disruption of the posterior vertebral body cortex on today's study. 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 Given the continued progression, metastatic disease remains a concern. Follow-up MRI of the lumbar spine recommended. 2. Stable appearance of the right paraspinal soft tissue nodule at the level of L1. 3. Stable ground-glass opacity in the right lung apex with internal linear soft tissue components. Continued attention on follow-up recommended. 4. Aortic Atherosclerosis (ICD10-I70.0). Electronically Signed   By: Misty Stanley M.D.   On: 08/22/2020 16:12     ASSESSMENT AND PLAN: This is a very pleasant 75 years old white male with stage Ia non-small cell lung cancer, adenocarcinoma status post left upper lobectomy with lymph node dissection under the care of Dr. Servando Snare in June 2020. The patient is currently on observation and he is feeling fine except for the intermittent low back pain. He had repeat CT scan of the chest  performed recently.  I personally and independently reviewed the scan images and discussed the result and showed the images to the patient and his wife. His scan showed no concerning findings for disease progression in the chest but there was continuous increase in the size of the L1 vertebral body lesion.  The patient had MRI of the lumbar spine in the past and it was suspicious for atypical hemangioma but it continues to increase in size. I will repeat MRI of the lumbar spine for further evaluation.  I will also refer the patient to interventional radiology for evaluation and management of the suspicious lesion and biopsy if concerning for metastasis or ablation if it is hemangioma. I will see the patient back for follow-up visit in 6 months for evaluation and repeat CT scan of the chest for restaging of his disease. The patient was advised to call  immediately if he has any other concerning symptoms in the interval.  The patient voices understanding of current disease status and treatment options and is in agreement with the current care plan.  All questions were answered. The patient knows to call the clinic with any problems, questions or concerns. We can certainly see the patient much sooner if necessary.   Disclaimer: This note was dictated with voice recognition software. Similar sounding words can inadvertently be transcribed and may not be corrected upon review.

## 2020-08-28 ENCOUNTER — Encounter: Payer: Self-pay | Admitting: Internal Medicine

## 2020-09-02 NOTE — Progress Notes (Signed)
[place] MRN: [id number] DOB: 1945/12/15  Referring provider: [name], MD Primary care provider: [name], MD  Reason for consult:  disequilibrium  Assessment/Plan:    Unsteady gait - likely related to subtle weakness due to Crestor, now resolved after discontinuation. L1 vertebral body lesion - hemangioma considered but given increased size, concern for metastasis.  He has follow up MRI tomorrow.   Subjective:  Miguel Wolfe is a 75 year old male with CAD, HTN, HLD, pre-diabetes, hypothyroidism, arthritis and history of non-small cell lung cancer who presents for disequilibrium but also MRI findings.  History supplemented by referring provider's note.  MRI of lumbar spine from February personally reviewed.   In late 2021, he started stumbling.  It wasn't dizziness but he just couldn't keep his balance. He started needing to ambulate with a cane for assistance.  Denies falls. Stopped Crestor in June and balance improved.    He has history of non-small cell lung cancer status post left upper lobectomy and has routine monitoring every 6 months.  CT chest with contrast from 02/02/2020 showed lucent sclerotic lesion in the L1 vertebral artery measuring 1.9 x 1.4 x 1.7 cm.  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  CT chest with contrast on 08/22/2020 noted increased size, then measuring 2.9 x 2.8 cm.  He has been evaluated by interventional radiology.  He has repeat MRI of lumbar spine tomorrow.   PAST MEDICAL HISTORY: Past Medical History:  Diagnosis Date   Anginal pain ([place])    Arthritis    Colon polyps    Coronary artery disease involving native coronary artery of native heart without angina pectoris 04/23/2016   Myoview 02/08/16: EF 51, inf-lat, apical lateral scar with peri-infarct ischemia; intermediate risk  // LHC 2/18: dLAD 20,  oD1 20, pLCx 20, pRCA 80, mRCA 20, dRCA 30, AM 90 >> PCI: 4 x 24 mm Synergy DES to pRCA    Family history of adverse reaction to anesthesia    Mom vomits and Dad went "nuts"   Family history of colon cancer    Family history of stomach cancer    Fatigue    GERD (gastroesophageal reflux disease)    Hemorrhoids    History of echocardiogram    Echo 02/08/16: Mild LVH, EF 55-60, no RWMA, Gr 1 DD, trivial AI, mild LAE   Hyperlipidemia    Hypertension    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   PONV (postoperative nausea and vomiting)    Pre-diabetes    Pulmonary nodules     PAST SURGICAL HISTORY: Past Surgical History:  Procedure Laterality Date   CARDIAC CATHETERIZATION     COLONOSCOPY WITH ESOPHAGOGASTRODUODENOSCOPY (EGD)  2008   CORONARY STENT INTERVENTION N/A 02/20/2016   Procedure: Coronary Stent Intervention;  Surgeon: Burnell Blanks, MD;  Location: [facility];  Service: Cardiovascular;  Laterality: N/A;   ESOPHAGOGASTRODUODENOSCOPY     LEFT HEART CATH AND CORONARY ANGIOGRAPHY N/A 02/20/2016   Procedure: Left Heart Cath and Coronary Angiography;  Surgeon: Burnell Blanks, MD;  Location: [facility];  Service: Cardiovascular;  Laterality: N/A;   PILONIDAL CYCT     REFRACTIVE SURGERY     retina tear repair   THYROIDECTOMY N/A 01/17/2018   Procedure: TOTAL THYROIDECTOMY;  Surgeon: Armandina Gemma, MD;  Location: [facility];  Service: General;  Laterality: N/A;  VIDEO ASSISTED THORACOSCOPY (VATS)/WEDGE RESECTION Left 06/24/2018   Procedure: VIDEO ASSISTED THORACOSCOPY (VATS)/LUNG RESECTION;  Surgeon: Grace Isaac, MD;  Location: [facility];  Service: Thoracic;  Laterality: Left;   VIDEO BRONCHOSCOPY WITH ENDOBRONCHIAL NAVIGATION N/A 11/18/2017   Procedure: VIDEO BRONCHOSCOPY WITH ENDOBRONCHIAL NAVIGATION WITH BIOPSIES OF LEFT AND RIGHT UPPER LOBES;  Surgeon: Grace Isaac, MD;  Location: [facility];  Service: Thoracic;  Laterality: N/A;   VIDEO BRONCHOSCOPY WITH ENDOBRONCHIAL  NAVIGATION N/A 06/11/2018   Procedure: VIDEO BRONCHOSCOPY WITH ENDOBRONCHIAL NAVIGATION;  Surgeon: Grace Isaac, MD;  Location: [facility];  Service: Thoracic;  Laterality: N/A;    MEDICATIONS: Current Outpatient Medications on File Prior to Visit  Medication Sig Dispense Refill   acetaminophen (TYLENOL) 325 MG tablet Take 650 mg by mouth every 6 (six) hours as needed for moderate pain or headache.      amLODipine (NORVASC) 5 MG tablet Take 1 tablet (5 mg total) by mouth daily. 90 tablet 3   aspirin EC 81 MG tablet Take 81 mg by mouth daily.     atorvastatin (LIPITOR) 10 MG tablet Take 1 tablet (10 mg total) by mouth daily. 90 tablet 3   famotidine (PEPCID) 20 MG tablet Take 20 mg by mouth daily.     ibuprofen (ADVIL,MOTRIN) 200 MG tablet Take 400 mg by mouth every 8 (eight) hours as needed for headache or moderate pain.      levothyroxine (SYNTHROID) 175 MCG tablet Take 175 mcg by mouth daily.     losartan (COZAAR) 100 MG tablet Take 100 mg by mouth daily.     nitroGLYCERIN (NITROSTAT) 0.4 MG SL tablet Place 1 tablet (0.4 mg total) under the tongue every 5 (five) minutes as needed for chest pain. 25 tablet 4   Omega-3 Fatty Acids (FISH OIL) 1000 MG CAPS Take by mouth.     pantoprazole (PROTONIX) 40 MG tablet Take 40 mg by mouth daily.     propranolol (INDERAL) 20 MG tablet Take 20 mg by mouth daily.  2   zolpidem (AMBIEN) 10 MG tablet Take 10 mg by mouth at bedtime as needed for sleep.   0   No current facility-administered medications on file prior to visit.    ALLERGIES: Allergies  Allergen Reactions   Gemfibrozil Other (See Comments)    Muscle pain   Other Other (See Comments)   Propranolol     Other reaction(s): SOB  Only had issue with Slow release propranolol   Niaspan [Niacin Er] Rash and Other (See Comments)    Flushing - aspirin did not mitigate    Simvastatin Other (See Comments)    Drug-Drug interaction with amlodipine    FAMILY HISTORY: Family History  Problem  Relation Age of Onset   Heart attack Father 24       BYPASS   Crohn's disease Sister    Stomach cancer Paternal Uncle        diagnosed mid-30s   Healthy Sister    Throat cancer Paternal Uncle        hx of smoking   Cancer Paternal Uncle        unknown type   Cancer Paternal Uncle        unknown type   Colon cancer Cousin 25       paternal cousin   Thyroid cancer Daughter        papillary    Objective:  Blood pressure (!) 156/83, pulse 66, height 5\' 11"  (1.803 m), weight 169 lb 3.2 oz (  76.7 kg), SpO2 98 %. General: No acute distress.  Patient appears well-groomed.   Head:  Normocephalic/atraumatic Eyes:  fundi examined but not visualized Neck: supple, no paraspinal tenderness, full range of motion Back: No paraspinal tenderness Heart: regular rate and rhythm Lungs: Clear to auscultation bilaterally. Vascular: No carotid bruits. Neurological Exam: Mental status: alert and oriented to person, place, and time, recent and remote memory intact, fund of knowledge intact, attention and concentration intact, speech fluent and not dysarthric, language intact. Cranial nerves: CN I: not tested CN II: pupils equal, round and reactive to light, visual fields intact CN III, IV, VI:  full range of motion, no nystagmus, no ptosis CN V: facial sensation intact. CN VII: upper and lower face symmetric CN VIII: hearing intact CN IX, X: gag intact, uvula midline CN XI: sternocleidomastoid and trapezius muscles intact CN XII: tongue midline Bulk & Tone: normal, no fasciculations. Motor:  muscle strength 5/5 throughout Sensation:  Pinprick, temperature and vibratory sensation intact. Deep Tendon Reflexes:  2+ throughout,  toes downgoing.   Finger to nose testing:  Without dysmetria.   Heel to shin:  Without dysmetria.   Gait:  Normal station and stride.  able to turn and tandem walk.  Romberg negative.    Thank you for allowing me to take part in the care of this patient.  Metta Clines,  DO  CC: [name], MD

## 2020-09-05 ENCOUNTER — Ambulatory Visit (INDEPENDENT_AMBULATORY_CARE_PROVIDER_SITE_OTHER): Payer: Medicare Other | Admitting: Neurology

## 2020-09-05 ENCOUNTER — Other Ambulatory Visit: Payer: Self-pay

## 2020-09-05 ENCOUNTER — Encounter: Payer: Self-pay | Admitting: Neurology

## 2020-09-05 VITALS — BP 156/83 | HR 66 | Ht 71.0 in | Wt 169.2 lb

## 2020-09-05 DIAGNOSIS — T466X5A Adverse effect of antihyperlipidemic and antiarteriosclerotic drugs, initial encounter: Secondary | ICD-10-CM | POA: Diagnosis not present

## 2020-09-05 DIAGNOSIS — I2510 Atherosclerotic heart disease of native coronary artery without angina pectoris: Secondary | ICD-10-CM

## 2020-09-05 DIAGNOSIS — M899 Disorder of bone, unspecified: Secondary | ICD-10-CM | POA: Diagnosis not present

## 2020-09-05 DIAGNOSIS — G720 Drug-induced myopathy: Secondary | ICD-10-CM

## 2020-09-06 ENCOUNTER — Ambulatory Visit (HOSPITAL_COMMUNITY)
Admission: RE | Admit: 2020-09-06 | Discharge: 2020-09-06 | Disposition: A | Discharge status: Home / Self Care | Payer: Medicare Other | Source: Ambulatory Visit | Attending: Internal Medicine | Admitting: Internal Medicine

## 2020-09-06 DIAGNOSIS — Z85118 Personal history of other malignant neoplasm of bronchus and lung: Secondary | ICD-10-CM | POA: Diagnosis not present

## 2020-09-06 DIAGNOSIS — M5137 Other intervertebral disc degeneration, lumbosacral region: Secondary | ICD-10-CM | POA: Diagnosis not present

## 2020-09-06 DIAGNOSIS — C3411 Malignant neoplasm of upper lobe, right bronchus or lung: Secondary | ICD-10-CM | POA: Insufficient documentation

## 2020-09-06 DIAGNOSIS — M5136 Other intervertebral disc degeneration, lumbar region: Secondary | ICD-10-CM | POA: Diagnosis not present

## 2020-09-06 DIAGNOSIS — D1809 Hemangioma of other sites: Secondary | ICD-10-CM | POA: Diagnosis not present

## 2020-09-06 MED ORDER — GADOBUTROL 1 MMOL/ML IV SOLN
7.5000 mL | Freq: Once | INTRAVENOUS | Status: AC | PRN
  Administered 2020-09-06: 7.5 mL via INTRAVENOUS

## 2020-09-08 ENCOUNTER — Other Ambulatory Visit: Payer: Self-pay | Admitting: Internal Medicine

## 2020-09-08 ENCOUNTER — Telehealth: Payer: Self-pay | Admitting: Internal Medicine

## 2020-09-08 ENCOUNTER — Telehealth: Payer: Self-pay | Admitting: Medical Oncology

## 2020-09-08 DIAGNOSIS — C3490 Malignant neoplasm of unspecified part of unspecified bronchus or lung: Secondary | ICD-10-CM

## 2020-09-08 NOTE — Telephone Encounter (Signed)
Per sch msg, scheduled appt. Called and spoke with patient. Confirmed appt

## 2020-09-08 NOTE — Progress Notes (Signed)
pet

## 2020-09-08 NOTE — Telephone Encounter (Signed)
MRI results requested.  Next appt 09/15.  What is reason for PET scan?

## 2020-09-09 DIAGNOSIS — Z20822 Contact with and (suspected) exposure to covid-19: Secondary | ICD-10-CM | POA: Diagnosis not present

## 2020-09-15 ENCOUNTER — Other Ambulatory Visit: Payer: Medicare Other | Admitting: *Deleted

## 2020-09-15 ENCOUNTER — Other Ambulatory Visit: Payer: Self-pay

## 2020-09-15 DIAGNOSIS — E7800 Pure hypercholesterolemia, unspecified: Secondary | ICD-10-CM

## 2020-09-15 LAB — HEPATIC FUNCTION PANEL
ALT: 13 IU/L (ref 0–44)
AST: 19 IU/L (ref 0–40)
Albumin: 4.8 g/dL — ABNORMAL HIGH (ref 3.7–4.7)
Alkaline Phosphatase: 72 IU/L (ref 44–121)
Bilirubin Total: 1 mg/dL (ref 0.0–1.2)
Bilirubin, Direct: 0.24 mg/dL (ref 0.00–0.40)
Total Protein: 6.7 g/dL (ref 6.0–8.5)

## 2020-09-15 LAB — LIPID PANEL
Chol/HDL Ratio: 3.4 ratio (ref 0.0–5.0)
Cholesterol, Total: 170 mg/dL (ref 100–199)
HDL: 50 mg/dL (ref >39)
LDL Chol Calc (NIH): 94 mg/dL (ref 0–99)
Triglycerides: 146 mg/dL (ref 0–149)
VLDL Cholesterol Cal: 26 mg/dL (ref 5–40)

## 2020-09-16 ENCOUNTER — Telehealth: Payer: Self-pay | Admitting: Student-PharmD

## 2020-09-16 DIAGNOSIS — E7800 Pure hypercholesterolemia, unspecified: Secondary | ICD-10-CM

## 2020-09-16 MED ORDER — EZETIMIBE 10 MG PO TABS: 10.0000 mg | ORAL_TABLET | Freq: Every day | ORAL | 3 refills | Status: DC

## 2020-09-16 NOTE — Telephone Encounter (Signed)
LDL is 94, after taking atorvastatin 10 mg daily for 6 weeks. Goal LDL <70 mg/dL (unclear baseline). Liver function is stable/wnl. Previously intolerant of high intensity rosuvastatin 20 mg daily. Given history of statin intolerance at higher doses and need to decrease LDL by >20%, will plan to start ezetimibe 10 mg daily now which generally provides ~20% LDL lowering. Will recheck lipid panel in 8 weeks. If LDL still not <70, can titrate statin further at that time (would expect additional 6% LDL lowering with each dose increase).   Called the patient who reports he is still tolerating atorvastatin well and is agreeable to starting ezetimibe. This has been ordered, and he is scheduled for follow up fasting labs on November 7th.

## 2020-09-22 ENCOUNTER — Ambulatory Visit: Payer: Medicare Other | Admitting: Internal Medicine

## 2020-09-23 ENCOUNTER — Other Ambulatory Visit: Payer: Self-pay

## 2020-09-23 ENCOUNTER — Ambulatory Visit (INDEPENDENT_AMBULATORY_CARE_PROVIDER_SITE_OTHER): Payer: Medicare Other | Admitting: Physician Assistant

## 2020-09-23 VITALS — BP 165/85 | HR 70 | Resp 20 | Ht 71.0 in | Wt 169.0 lb

## 2020-09-23 DIAGNOSIS — Z902 Acquired absence of lung [part of]: Secondary | ICD-10-CM

## 2020-09-23 DIAGNOSIS — Z85118 Personal history of other malignant neoplasm of bronchus and lung: Secondary | ICD-10-CM | POA: Diagnosis not present

## 2020-09-23 NOTE — Progress Notes (Signed)
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 SURGICAL PROCEDURE:  Left video-assisted thoracoscopy, left upper lobectomy with lymph node dissection, and intercostal nerve block.  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 - MARGINS NOT INVOLVED. - TUMOR CONFINED WITHIN THYROID CAPSULE.  History of Present Illness:     Patient returns to the office in follow-up after left upper lobectomy June 23, 2018 from his left upper lobe adenocarcinoma of the lung stage I A3.  He is also followed for groundglass opacity right lung apex.  Findings from his 8/15 CT scan were already discussed with the patient.  He is closely followed in the medical oncology clinic by Dr. Glendell Docker has had his scans done through the oncology center.    Today, he presents to review his CT scan and for follow-up. He has been remaining active with golf and hiking. He has stopped golfing due to his lower back pain. He also likes to do some wood working in his spare time.    He has been fully vaccinated including booster for Covid   Zubrod Score: At the time of surgery this patient's  most appropriate activity status/level should be described as: _0     0    Normal activity, no symptoms _1     1    Restricted in physical strenuous activity but ambulatory, able to do out light work _2     2    Ambulatory and capable of self care, unable to do work activities, up and about                 >50 % of waking hours                                                                                   _3     3    Only limited self care, in bed greater than 50% of waking hours _4     4    Completely disabled, no self care, confined to bed or chair _5     5    Moribund  Social History   Tobacco Use  Smoking Status Former Smoker   Packs/day: 1.00   Years: 3.00   Pack years: 3.00   Types: Cigarettes   Quit  date: 02/01/1967   Years since quitting: 53.1  Smokeless Tobacco Never Used       Allergies  Allergen Reactions   Gemfibrozil Other (See Comments)    Muscle pain   Other Other (See Comments)   Propranolol     Other reaction(s): SOB  Only had issue with Slow release propranolol   Niaspan [Niacin Er] Rash and Other (See Comments)    Flushing - aspirin did not mitigate    Simvastatin Other (See Comments)    Drug-Drug interaction with amlodipine    Current Outpatient Medications on File Prior to Visit  Medication Sig Dispense Refill   acetaminophen (TYLENOL) 325 MG tablet Take 650 mg by mouth every 6 (six) hours as needed for moderate pain or headache.      amLODipine (NORVASC) 5 MG tablet Take 1 tablet (5 mg total) by mouth daily. 90 tablet 3   aspirin EC 81 MG tablet Take 81 mg by mouth daily.     atorvastatin (LIPITOR) 10 MG tablet Take 1 tablet (10 mg total) by mouth daily. 90 tablet 3   ezetimibe (ZETIA) 10 MG tablet Take 1 tablet (10 mg total) by mouth daily. 90 tablet 3   famotidine (PEPCID) 20 MG tablet Take 20 mg by mouth daily.     ibuprofen (ADVIL,MOTRIN) 200 MG tablet Take 400 mg by mouth every 8 (eight) hours as needed for headache or moderate pain.       levothyroxine (SYNTHROID) 175 MCG tablet Take 175 mcg by mouth daily.     losartan (COZAAR) 100 MG tablet Take 100 mg by mouth daily.     nitroGLYCERIN (NITROSTAT) 0.4 MG SL tablet Place 1 tablet (0.4 mg total) under the tongue every 5 (five) minutes as needed for chest pain. 25 tablet 4   Omega-3 Fatty Acids (FISH OIL) 1000 MG CAPS Take by mouth.     pantoprazole (PROTONIX) 40 MG tablet Take 40 mg by mouth daily.     propranolol (INDERAL) 20 MG tablet Take 20 mg by mouth daily.  2   zolpidem (AMBIEN) 10 MG tablet Take 10 mg by mouth at bedtime as needed for sleep.   0   No current facility-administered medications on file prior to visit.         Physical Exam: Vitals:   09/23/20 1007  BP: (!) 165/85  Pulse: 70  Resp: 20  SpO2: 98%    Cor: RRR, no murmur Pulm: CTA bilaterally and in all fields Abd: no tenderness Ext: no edema   Diagnostic Studies & Laboratory data:         Recent Radiology Findings:  CLINICAL DATA:  Non-small-cell lung cancer. Restaging. Status post left upper lobectomy.   EXAM: CT CHEST WITH CONTRAST   TECHNIQUE: Multidetector CT imaging of the chest was performed during intravenous contrast administration.   CONTRAST:  51mL OMNIPAQUE IOHEXOL 350 MG/ML SOLN   COMPARISON:  02/02/2020   FINDINGS: Cardiovascular: The heart size is normal. No substantial pericardial effusion. Coronary artery calcification is evident. Ascending thoracic aorta measures 4.1 cm diameter.   Mediastinum/Nodes: No mediastinal lymphadenopathy. There is no hilar lymphadenopathy. The esophagus has normal imaging features. There is no axillary lymphadenopathy.   Lungs/Pleura: Volume loss left hemithorax compatible with left upper lobectomy. No new suspicious nodule or mass in the left lung. Ground-glass opacity in the right lung apex with internal linear soft tissue components is stable in the interval. No new suspicious nodule or mass in the right lung. No focal  airspace  consolidation. No pleural effusion.   Upper Abdomen: Previously described 2.6 x 1.6 cm right paraspinal soft tissue nodule at the level of L1 is stable. As noted previously, this lesion was not hypermetabolic on prior PET imaging.   Musculoskeletal: The lucent L1 vertebral body lesion is progressive in the interval measuring 2.9 x 2.8 cm today compared to 1.9 x 1.4 cm on the prior CT. While this does have a suggestion of central coarsened trabecula, there appears to be disruption of the posterior vertebral body cortex on today's study.   IMPRESSION: 1. Interval progression of the lucent L1 vertebral body lesion with possible disruption of the posterior vertebral body cortex on today's study. 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 Given the continued progression, metastatic disease remains a concern. Follow-up MRI of the lumbar spine recommended. 2. Stable appearance of the right paraspinal soft tissue nodule at the level of L1. 3. Stable ground-glass opacity in the right lung apex with internal linear soft tissue components. Continued attention on follow-up recommended. 4. Aortic Atherosclerosis (ICD10-I70.0).     Electronically Signed   By: Misty Stanley M.D.   On: 08/22/2020 16:12  CT Chest W Contrast  Result Date: 02/02/2020 CLINICAL DATA:  Restaging non-small cell lung cancer. History of partial left lung resection 06/24/2018. EXAM: CT CHEST WITH CONTRAST TECHNIQUE: Multidetector CT imaging of the chest was performed during intravenous contrast administration. CONTRAST:  42m OMNIPAQUE IOHEXOL 300 MG/ML  SOLN COMPARISON:  CT chest 08/03/2019 and 02/02/2019. FINDINGS: Cardiovascular: No acute vascular findings. There is atherosclerosis of the aorta, great vessels and coronary arteries. The heart size is normal. There is no pericardial effusion. Mediastinum/Nodes: There are no  enlarged mediastinal, hilar or axillary lymph nodes.Status post thyroidectomy. The esophagus and trachea appear unremarkable. Lungs/Pleura: Stable postsurgical changes from previous left upper lobe resection. The previously demonstrated left-sided pneumothorax has resolved. There is no pleural effusion. There is a stable ground-glass density at the right lung apex with internal linear components. No developing soft tissue components, new or enlarging pulmonary nodules. Upper abdomen: Right paraspinal/retrocrural soft tissue nodule at L1 measuring 2.6 x 1.6 cm on image 162/2 is unchanged. This was not hypermetabolic on prior PET-CT and is likely an incidental nerve sheath lesion. The visualized upper abdomen appears stable without significant findings. Musculoskeletal/Chest wall: There is a lucent lesion with sclerotic margins posteriorly in the L1 vertebral body which has mildly enlarged compared with the prior studies. This measures approximately 1.9 x 1.4 x 1.7 cm and is indeterminate based on interval enlargement. No other focal osseous lesions are identified. IMPRESSION: 1. Indeterminate lucent lesion with sclerotic margins posteriorly in the L1 vertebral body has mildly enlarged compared with prior studies and is potentially a metastasis. Consider further evaluation with lumbar MRI without and with contrast. 2. Otherwise stable chest CT without additional evidence metastatic disease or local recurrence post left upper lobe resection. Interval resolution of previously demonstrated small left-sided pneumothorax. 3. Stable right apical ground-glass density for which continued follow-up recommended. 4. Aortic Atherosclerosis (ICD10-I70.0). Electronically Signed   By: WRichardean SaleM.D.   On: 02/02/2020 13:51   MR Lumbar Spine W Wo Contrast  Result Date: 02/13/2020 CLINICAL DATA:  L1 bone lesion seen on recent CT scan. History of lung cancer. Evaluate for metastatic disease. EXAM: MRI LUMBAR SPINE WITHOUT AND  WITH CONTRAST TECHNIQUE: Multiplanar and multiecho pulse sequences of the lumbar spine were obtained without and with intravenous  contrast. CONTRAST:  7.58m GADAVIST GADOBUTROL 1 MMOL/ML IV SOLN COMPARISON:  CT chest 02/02/2020, 08/03/2019 FINDINGS: Segmentation:  Standard. Alignment:  Physiologic. Vertebrae: 2.4 cm T1 hypointense and T2 hyperintense bone lesion in the L1 vertebral body with fine internal trabeculation and enhancement on postcontrast imaging. Other T1 and T2 hyperintense bone lesions in the lumbar vertebral bodies consistent with small hemangiomas. No fracture, evidence of discitis, or other bone lesion. Conus medullaris and cauda equina: Conus extends to the T12-L1 level. Conus and cauda equina appear normal. Paraspinal and other soft tissues: No acute paraspinal abnormality. Disc levels: Disc spaces: Degenerative disease with disc height loss at L5-S1. Disc desiccation at L4-5. T12-L1: No significant disc bulge. No evidence of neural foraminal stenosis. No central canal stenosis. L1-L2: Minimal broad-based disc bulge. No evidence of neural foraminal stenosis. No central canal stenosis. L2-L3: No significant disc bulge. No evidence of neural foraminal stenosis. No central canal stenosis. L3-L4: No significant disc bulge. No evidence of neural foraminal stenosis. No central canal stenosis. L4-L5: Minimal broad-based disc bulge. No evidence of neural foraminal stenosis. No central canal stenosis. L5-S1: Minimal broad-based disc bulge. Mild bilateral facet arthropathy. Mild bilateral foraminal narrowing. No evidence of neural foraminal stenosis. No central canal stenosis. IMPRESSION: 1. A 2.4 cm bone lesion in the L1 vertebral body with fine internal trabeculation and enhancement on postcontrast imaging. The overall appearance is more suggestive of an atypical hemangioma, but given the patient's given the history of lung cancer, follow-up MRI of the lumbar spine is recommended in 3-6 months to  document ongoing stability. 2. Mild lumbar spine spondylosis. No evidence of nerve root impingement or central canal stenosis. Electronically Signed   By: HKathreen Devoid  On: 02/13/2020 13:16     CLINICAL DATA:  Lung cancer.   EXAM: CT CHEST WITH CONTRAST   TECHNIQUE: Multidetector CT imaging of the chest was performed during intravenous contrast administration.   CONTRAST:  781mOMNIPAQUE IOHEXOL 300 MG/ML  SOLN   COMPARISON:  02/02/2019.   FINDINGS: Cardiovascular: Atherosclerotic calcification of the aorta and coronary arteries. Heart size normal. Left ventricle appears dilated. No pericardial effusion.   Mediastinum/Nodes: Thyroidectomy. No pathologically enlarged mediastinal, hilar or axillary lymph nodes. Esophagus is unremarkable.   Lungs/Pleura: Left upper lobectomy with scarring in the left hemithorax. A 2.1 x 3.3 cm sub solid mass in the apical segment right upper lobe (7/23) has an internal linear component and is unchanged. Tiny loculated collections of pleural fluid and air in the anterior and posterior aspects of the medial left hemithorax, decreased from prior. Airway is unremarkable. Left hemithorax, unchanged.   Upper Abdomen: Visualized portions of the liver, gallbladder, adrenal glands, kidneys, spleen, pancreas, stomach and bowel are grossly unremarkable.   Musculoskeletal: Degenerative changes in the spine. No worrisome lytic or sclerotic lesions.   IMPRESSION: 1. Left upper lobectomy. No evidence of metastatic disease. Tiny collections of loculated pleural fluid and air in the left hemithorax, improved from prior. 2. Predominantly ground-glass mass in the apical segment right upper lobe with an internal linear component, stable. Continued attention on follow-up exams is warranted as adenocarcinoma cannot be excluded. 3. Aortic atherosclerosis (ICD10-I70.0). Coronary artery calcification.     Electronically Signed   By: MeLorin Picket.D.    On: 08/03/2019 15:43    CLINICAL DATA:  Lung cancer.   EXAM: CT CHEST WITH CONTRAST   TECHNIQUE: Multidetector CT imaging of the chest was performed during intravenous contrast administration.   CONTRAST:  7565mMNIPAQUE IOHEXOL  300 MG/ML  SOLN   COMPARISON:  02/02/2019.   FINDINGS: Cardiovascular: Atherosclerotic calcification of the aorta and coronary arteries. Heart size normal. Left ventricle appears dilated. No pericardial effusion.   Mediastinum/Nodes: Thyroidectomy. No pathologically enlarged mediastinal, hilar or axillary lymph nodes. Esophagus is unremarkable.   Lungs/Pleura: Left upper lobectomy with scarring in the left hemithorax. A 2.1 x 3.3 cm sub solid mass in the apical segment right upper lobe (7/23) has an internal linear component and is unchanged. Tiny loculated collections of pleural fluid and air in the anterior and posterior aspects of the medial left hemithorax, decreased from prior. Airway is unremarkable. Left hemithorax, unchanged.   Upper Abdomen: Visualized portions of the liver, gallbladder, adrenal glands, kidneys, spleen, pancreas, stomach and bowel are grossly unremarkable.   Musculoskeletal: Degenerative changes in the spine. No worrisome lytic or sclerotic lesions.   IMPRESSION: 1. Left upper lobectomy. No evidence of metastatic disease. Tiny collections of loculated pleural fluid and air in the left hemithorax, improved from prior. 2. Predominantly ground-glass mass in the apical segment right upper lobe with an internal linear component, stable. Continued attention on follow-up exams is warranted as adenocarcinoma cannot be excluded. 3. Aortic atherosclerosis (ICD10-I70.0). Coronary artery calcification.     Electronically Signed   By: Lorin Picket M.D.   On: 08/03/2019 15:43      Recent Labs: Lab Results  Component Value Date   WBC 7.0 02/02/2020   HGB 15.4 02/02/2020   HCT 46.3 02/02/2020   PLT 176 02/02/2020    GLUCOSE 95 02/02/2020   CHOL 151 01/21/2017   TRIG 177 (H) 01/21/2017   HDL 48 01/21/2017   LDLCALC 68 01/21/2017   ALT 17 02/02/2020   AST 22 02/02/2020   NA 144 02/02/2020   K 4.1 02/02/2020   CL 105 02/02/2020   CREATININE 0.84 02/02/2020   BUN 19 02/02/2020   CO2 30 02/02/2020   TSH 10.736 (H) 01/24/2018   INR 1.1 06/20/2018   HGBA1C 5.9 (H) 06/09/2018    PATH:  Diagnosis 1. Lung, resection (segmental or lobe), left upper lobe - ADENOCARCINOMA, 2.3 CM. - THREE BENIGN LYMPH NODES (0/3). - MARGINS NOT INVOLVED. 2. Lymph node, biopsy, 10 Wolfe node - ANTHRACOTIC LYMPH NODE. - NO METASTATIC CARCINOMA IDENTIFIED. 3. Lymph node, biopsy, 10 Wolfe #2 - ANTHRACOTIC LYMPH NODE. - NO METASTATIC CARCINOMA IDENTIFIED. 4. Lymph node, biopsy, 11 Wolfe node - ANTHRACOTIC LYMPH NODE. - NO METASTATIC CARCINOMA IDENTIFIED. 5. Lymph node, biopsy, 4 Wolfe node - ANTHRACOTIC LYMPH NODE. - NO METASTATIC CARCINOMA IDENTIFIED. 6. Lymph node, biopsy, 4 Wolfe #2 node - ANTHRACOTIC LYMPH NODE. - NO METASTATIC CARCINOMA IDENTIFIED. 7. Lymph node, biopsy, 11 Wolfe #2 node - ANTHRACOTIC LYMPH NODE. - NO METASTATIC CARCINOMA IDENTIFIED. 8. Lymph node, biopsy, 12 Wolfe node - ANTHRACOTIC LYMPH NODE. - NO METASTATIC CARCINOMA IDENTIFIED. 9. Lymph node, biopsy, 12 Wolfe #2 node - ANTHRACOTIC LYMPH NODE. - NO METASTATIC CARCINOMA IDENTIFIED. 10. Lymph node, biopsy, Level 8 Paraesophageal - ANTHRACOTIC LYMPH NODE. 1 of 3 FINAL for Wolfe, Miguel Wolfe ([id number]) Diagnosis(continued) - NO METASTATIC CARCINOMA IDENTIFIED. 11. Lymph node, biopsy, 4 Wolfe #3 node - ANTHRACOTIC LYMPH NODE. - NO METASTATIC CARCINOMA IDENTIFIED.  Microscopic Comment 1. LUNG: Procedure: Left upper lobectomy and ten additional lymph node biopsies. Specimen Laterality: Left. Tumor Site: Left upper lobe. Tumor Size: 2.3 x 2.0 x 1.7 cm. Total Tumor Size Inclusive of Invasive and Lepidic Components (applies only to invasive  nonmucinous adenocarcinoma with a lepidic component): 2.3 cm.  Invasive Tumor Size (applies only to invasive nonmucinous adenocarcinoma with a lepidic component: 2.3 cm. Tumor Focality: Unifocal. Histologic Type: Adenocarcinoma. Visceral Pleura Invasion: No. Lymphovascular Invasion: No. Direct Invasion of Adjacent Structures: N/A. Margins: Free of tumor. Treatment Effect: No. Regional Lymph Nodes: Number of Lymph Nodes Involved: 0. Number of Lymph Nodes Examined: 13. Pathologic Stage Classification (pTNM, AJCC 8th Edition): pTic, pN0. Ancillary Studies: Foundation 1 and PDL-1 pending. Representative Tumor Block: 1E and 44F. (JDP:ah 06/25/18) (v4.0.0.3) Claudette Laws MD  Assessment / Plan:    #1 stage Ia non-small cell lung cancer left upper lobe adenocarcinoma status post left upper lobectomy and node dissection- now 26 months postop   #2 Interval progression of the lucent L1 vertebral body lesion with possible disruption of the posterior vertebral body cortex. 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 Given the continued progression, metastatic disease remains a concern. Follow-up MRI of the lumbar spine recommended.  #3 Stable ground-glass opacity in the right lung apex with internal linear soft tissue components. Continued attention on follow-up recommended.  #4 Stable appearance of the right paraspinal soft tissue nodule at the level of L1  Patient is closely followed in the medical oncology clinic.  He has a PET scan scheduled with them next week. He would like to meet Dr. Kipp Brood in case there is a need for further surgical intervention. We will make a 6 month appointment with out practice.   Medication Changes: No orders of the defined types were placed in this encounter.   Nicholes Rough, PA-C [phone]

## 2020-09-26 ENCOUNTER — Other Ambulatory Visit: Payer: Self-pay

## 2020-09-26 ENCOUNTER — Ambulatory Visit (HOSPITAL_COMMUNITY)
Admission: RE | Admit: 2020-09-26 | Discharge: 2020-09-26 | Disposition: A | Discharge status: Home / Self Care | Payer: Medicare Other | Source: Ambulatory Visit | Attending: Internal Medicine | Admitting: Internal Medicine

## 2020-09-26 DIAGNOSIS — C3490 Malignant neoplasm of unspecified part of unspecified bronchus or lung: Secondary | ICD-10-CM | POA: Insufficient documentation

## 2020-09-26 DIAGNOSIS — I700 Atherosclerosis of aorta: Secondary | ICD-10-CM | POA: Diagnosis not present

## 2020-09-26 DIAGNOSIS — M899 Disorder of bone, unspecified: Secondary | ICD-10-CM | POA: Insufficient documentation

## 2020-09-26 DIAGNOSIS — E890 Postprocedural hypothyroidism: Secondary | ICD-10-CM | POA: Diagnosis not present

## 2020-09-26 DIAGNOSIS — I2510 Atherosclerotic heart disease of native coronary artery without angina pectoris: Secondary | ICD-10-CM | POA: Insufficient documentation

## 2020-09-26 DIAGNOSIS — C7951 Secondary malignant neoplasm of bone: Secondary | ICD-10-CM | POA: Diagnosis not present

## 2020-09-26 LAB — GLUCOSE, CAPILLARY: Glucose-Capillary: 105 mg/dL — ABNORMAL HIGH (ref 70–99)

## 2020-09-26 MED ORDER — FLUDEOXYGLUCOSE F - 18 (FDG) INJECTION
7.0000 | Freq: Once | INTRAVENOUS | Status: AC | PRN
  Administered 2020-09-26: 8.36 via INTRAVENOUS

## 2020-09-29 ENCOUNTER — Inpatient Hospital Stay: Payer: Medicare Other | Attending: Internal Medicine | Admitting: Internal Medicine

## 2020-09-29 ENCOUNTER — Other Ambulatory Visit: Payer: Self-pay

## 2020-09-29 VITALS — BP 144/82 | HR 62 | Temp 97.9°F | Resp 18 | Wt 168.5 lb

## 2020-09-29 DIAGNOSIS — C3412 Malignant neoplasm of upper lobe, left bronchus or lung: Secondary | ICD-10-CM | POA: Diagnosis not present

## 2020-09-29 DIAGNOSIS — C3410 Malignant neoplasm of upper lobe, unspecified bronchus or lung: Secondary | ICD-10-CM | POA: Diagnosis not present

## 2020-09-29 DIAGNOSIS — D1809 Hemangioma of other sites: Secondary | ICD-10-CM | POA: Diagnosis not present

## 2020-09-29 DIAGNOSIS — M545 Low back pain, unspecified: Secondary | ICD-10-CM | POA: Diagnosis not present

## 2020-09-29 NOTE — Progress Notes (Signed)
[place] Telephone:([phone]) [phone]   [phone]) [phone]  OFFICE PROGRESS NOTE  Miguel Contras, MD [place] [address]  DIAGNOSIS: Stage IA (T1c, N0, M0) non-small cell lung cancer, adenocarcinoma with no actionable mutation and negative PDL 1 expression diagnosed in June 2020.   PRIOR THERAPY: Status post left upper lobectomy with lymph node dissection on June 24, 2018.  CURRENT THERAPY: Observation.  INTERVAL HISTORY: Miguel Wolfe 75 y.o. male returns to [facility] clinic today for follow-up visit accompanied by his wife.  Miguel Wolfe is feeling fine today with no concerning complaints except for Miguel intermittent low back pain.  He denied having any significant chest pain, shortness of breath, cough or hemoptysis.  He denied having any nausea, vomiting, diarrhea or constipation.  He denied having any headache or visual changes.  He has no weight loss or night sweats.  He was found on previous CT scan of Miguel chest to have interval progression of Miguel lucent L1 vertebral body lesion.  Miguel finding were suspicious for atypical hemangioma but metastatic disease could not be excluded.  He had MRI of Miguel lumbar spine on September 06, 2020 and that showed enlargement of Miguel T1 dark and enhancing lesion within Miguel L1 vertebral body since February with suspicious of early extension into Miguel vertebral epidural space and Miguel concern was about metastasis versus aggressive hemangioma.  Miguel Wolfe had a PET scan performed recently and he is here for evaluation and discussion of his PET scan results.    MEDICAL HISTORY: Past Medical History:  Diagnosis Date   Anginal pain ([place])    Arthritis    Colon polyps    Coronary artery disease involving native coronary artery of native heart without angina pectoris 04/23/2016   Myoview 02/08/16: EF 51, inf-lat, apical lateral scar with peri-infarct ischemia; intermediate risk  // LHC 2/18: dLAD 20, oD1 20, pLCx 20, pRCA 80,  mRCA 20, dRCA 30, AM 90 >> PCI: 4 x 24 mm Synergy DES to pRCA    Family history of adverse reaction to anesthesia    Mom vomits and Dad went "nuts"   Family history of colon cancer    Family history of stomach cancer    Fatigue    GERD (gastroesophageal reflux disease)    Hemorrhoids    History of echocardiogram    Echo 02/08/16: Mild LVH, EF 55-60, no RWMA, Gr 1 DD, trivial AI, mild LAE   Hyperlipidemia    Hypertension    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   PONV (postoperative nausea and vomiting)    Pre-diabetes    Pulmonary nodules     ALLERGIES:  is allergic to gemfibrozil, other, propranolol, niaspan [niacin er], and simvastatin.  MEDICATIONS:  Current Outpatient Medications  Medication Sig Dispense Refill   acetaminophen (TYLENOL) 325 MG tablet Take 650 mg by mouth every 6 (six) hours as needed for moderate pain or headache.      amLODipine (NORVASC) 5 MG tablet Take 1 tablet (5 mg total) by mouth daily. 90 tablet 3   aspirin EC 81 MG tablet Take 81 mg by mouth daily.     atorvastatin (LIPITOR) 10 MG tablet Take 1 tablet (10 mg total) by mouth daily. 90 tablet 3   ezetimibe (ZETIA) 10 MG tablet Take 1 tablet (10 mg total) by mouth daily. 90 tablet 3   famotidine (PEPCID) 20 MG tablet Take 20 mg by mouth daily.     ibuprofen (ADVIL,MOTRIN) 200  MG tablet Take 400 mg by mouth every 8 (eight) hours as needed for headache or moderate pain.      levothyroxine (SYNTHROID) 175 MCG tablet Take 175 mcg by mouth daily.     losartan (COZAAR) 100 MG tablet Take 100 mg by mouth daily.     nitroGLYCERIN (NITROSTAT) 0.4 MG SL tablet Place 1 tablet (0.4 mg total) under Miguel tongue every 5 (five) minutes as needed for chest pain. 25 tablet 4   Omega-3 Fatty Acids (FISH OIL) 1000 MG CAPS Take by mouth.     pantoprazole (PROTONIX) 40 MG tablet Take 40 mg by mouth daily.     propranolol (INDERAL) 20 MG tablet Take 20 mg by mouth daily.  2   zolpidem (AMBIEN) 10 MG tablet Take 10 mg by mouth at bedtime as needed for  sleep.   0   No current facility-administered medications for this visit.    SURGICAL HISTORY:  Past Surgical History:  Procedure Laterality Date   CARDIAC CATHETERIZATION     COLONOSCOPY WITH ESOPHAGOGASTRODUODENOSCOPY (EGD)  2008   CORONARY STENT INTERVENTION N/A 02/20/2016   Procedure: Coronary Stent Intervention;  Surgeon: Burnell Blanks, MD;  Location: [facility];  Service: Cardiovascular;  Laterality: N/A;   ESOPHAGOGASTRODUODENOSCOPY     LEFT HEART CATH AND CORONARY ANGIOGRAPHY N/A 02/20/2016   Procedure: Left Heart Cath and Coronary Angiography;  Surgeon: Burnell Blanks, MD;  Location: [facility];  Service: Cardiovascular;  Laterality: N/A;   PILONIDAL CYCT     REFRACTIVE SURGERY     retina tear repair   THYROIDECTOMY N/A 01/17/2018   Procedure: TOTAL THYROIDECTOMY;  Surgeon: Armandina Gemma, MD;  Location: [facility];  Service: General;  Laterality: N/A;   VIDEO ASSISTED THORACOSCOPY (VATS)/WEDGE RESECTION Left 06/24/2018   Procedure: VIDEO ASSISTED THORACOSCOPY (VATS)/LUNG RESECTION;  Surgeon: Grace Isaac, MD;  Location: [facility];  Service: Thoracic;  Laterality: Left;   VIDEO BRONCHOSCOPY WITH ENDOBRONCHIAL NAVIGATION N/A 11/18/2017   Procedure: VIDEO BRONCHOSCOPY WITH ENDOBRONCHIAL NAVIGATION WITH BIOPSIES OF LEFT AND RIGHT UPPER LOBES;  Surgeon: Grace Isaac, MD;  Location: [facility];  Service: Thoracic;  Laterality: N/A;   VIDEO BRONCHOSCOPY WITH ENDOBRONCHIAL NAVIGATION N/A 06/11/2018   Procedure: VIDEO BRONCHOSCOPY WITH ENDOBRONCHIAL NAVIGATION;  Surgeon: Grace Isaac, MD;  Location: [facility];  Service: Thoracic;  Laterality: N/A;    REVIEW OF SYSTEMS:  Constitutional: negative Eyes: negative Ears, nose, mouth, throat, and face: negative Respiratory: negative Cardiovascular: negative Gastrointestinal: negative Genitourinary:negative Integument/breast: negative Hematologic/lymphatic: negative Musculoskeletal:positive for back  pain Neurological: negative Behavioral/Psych: negative Endocrine: negative Allergic/Immunologic: negative   PHYSICAL EXAMINATION: General appearance: alert, cooperative, and no distress Head: Normocephalic, without obvious abnormality, atraumatic Neck: no adenopathy, no JVD, supple, symmetrical, trachea midline, and thyroid not enlarged, symmetric, no tenderness/mass/nodules Lymph nodes: Cervical, supraclavicular, and axillary nodes normal. Resp: clear to auscultation bilaterally Back: symmetric, no curvature. ROM normal. No CVA tenderness. Cardio: regular rate and rhythm, S1, S2 normal, no murmur, click, rub or gallop GI: soft, non-tender; bowel sounds normal; no masses,  no organomegaly Extremities: extremities normal, atraumatic, no cyanosis or edema Neurologic: Alert and oriented X 3, normal strength and tone. Normal symmetric reflexes. Normal coordination and gait  ECOG PERFORMANCE STATUS: 1 - Symptomatic but completely ambulatory  Blood pressure (!) 144/82, pulse 62, temperature 97.9 F (36.6 C), temperature source Tympanic, resp. rate 18, weight 168 lb 8 oz (76.4 kg), SpO2 97 %.  LABORATORY DATA: Lab Results  Component Value Date  WBC 6.7 08/22/2020   HGB 15.7 08/22/2020   HCT 45.6 08/22/2020   MCV 89.2 08/22/2020   PLT 165 08/22/2020      Chemistry      Component Value Date/Time   NA 143 08/22/2020 0956   NA 143 04/24/2016 0935   K 3.9 08/22/2020 0956   CL 104 08/22/2020 0956   CO2 29 08/22/2020 0956   BUN 15 08/22/2020 0956   BUN 17 04/24/2016 0935   CREATININE 0.85 08/22/2020 0956      Component Value Date/Time   CALCIUM 9.0 08/22/2020 0956   ALKPHOS 72 09/15/2020 0731   AST 19 09/15/2020 0731   AST 20 08/22/2020 0956   ALT 13 09/15/2020 0731   ALT 15 08/22/2020 0956   BILITOT 1.0 09/15/2020 0731   BILITOT 1.0 08/22/2020 0956       RADIOGRAPHIC STUDIES: MR Lumbar Spine W Wo Contrast  Result Date: 09/08/2020 CLINICAL DATA:  75 year old male with  history of lung cancer and L1 bone lesion. EXAM: MRI LUMBAR SPINE WITHOUT AND WITH CONTRAST TECHNIQUE: Multiplanar and multiecho pulse sequences of Miguel lumbar spine were obtained without and with intravenous contrast. CONTRAST:  7.56mL GADAVIST GADOBUTROL 1 MMOL/ML IV SOLN COMPARISON:  Chest CTs since 10/23/2017. Lumbar MRI 02/13/2020. PET-CT 11/14/2017. FINDINGS: Segmentation:  Normal. Alignment: Stable lordosis, vertebral height and alignment since February. Vertebrae: Dark T1 rounded marrow lesion occupying Miguel posterior L1 body appears mildly larger since February, note subtotal craniocaudal involvement of Miguel L1 body now on series 4, image 10, and increased anterior extension on series 4, image 11. Likewise, Miguel lesion appears larger on both STIR and postcontrast T1 imaging with a more indistinct anterior margin. On axial T2 images Miguel diameter has increased from 24 mm to nearly 34 mm (series 5, image 9). Subtle evidence that there was a mildly trabeculated bone lesion here in 2019. No definite loss of vertebral body height. However, there is now subtle bulging of Miguel posterior body toward Miguel ventral epidural space see series 5, image 10. Scattered small benign fat containing vertebral hemangiomas are present in Miguel lower lumbar spine and visible sacrum. There is no other suspicious or enhancing bone lesion identified. Conus medullaris and cauda equina: Conus extends to Miguel T12-L1 level. No lower spinal cord or conus signal abnormality. No abnormal intradural enhancement. No dural thickening. Paraspinal and other soft tissues: There is a chronic elongated T2 and STIR hyperintense and heterogeneously enhancing right paraspinal soft tissue mass at Miguel L1 level situated between Miguel vertebral body and Miguel right diaphragm crura (series 5, image 9, series 8, image 9 and series 3, image 3. This encompasses up to 4.8 cm in length and does not appear significantly changed in size or configuration since February.  Furthermore, this was present on Miguel 2019 PET-CT with low level FDG activity at that time and minimal increased size (16 mm transverse now versus 15 mm than). No associated paraspinal inflammation. Miguel remaining lumbar paraspinal soft tissues are stable and within normal limits. Stable, negative visible abdominal viscera. Disc levels: Stable lumbar spine degeneration, with most pronounced chronic disc and endplate degeneration at L5-S1. No degenerative spinal stenosis. IMPRESSION: 1. Enlargement of Miguel T1 dark and enhancing lesion within Miguel L1 vertebral body since February. Diameter increased from 24-34 mm and margins appear more indistinct. And there is suspicion of early extension into Miguel ventral epidural space now also. This constellation is most suspicious for Metastasis, although "aggressive" hemangioma remains a less likely possibility. Biopsy for histologic correlation  probably is Miguel next best step, although percutaneous biopsy may be a challenge given Miguel central location of Miguel lesion. Alternatively a repeat PET-CT might also be valuable, and although rarely hemangioma can be hypermetabolic - there is a prior 2019 PET for comparison. 2. Interestingly, there is a also chronic spindle shaped right paraspinal soft tissue mass at L1 which has not significantly changed in size since a 2019 PET-CT where it did not appear hypermetabolic. Therefore, benign etiology is favored for this lesion - perhaps a nerve sheath or other neurogenic origin tumor. 3. No other suspicious bone lesion identified in Miguel lumbar spine. Electronically Signed   By: Genevie Ann M.D.   On: 09/08/2020 06:20   NM PET Image Restage (PS) Skull Base to Thigh (F-18 FDG)  Result Date: 09/27/2020 CLINICAL DATA:  Subsequent treatment strategy for non-small-cell lung cancer, status post wedge resection 06/24/2018. CT and MRI demonstrating an L1 vertebral body lesion. EXAM: NUCLEAR MEDICINE PET SKULL BASE TO THIGH TECHNIQUE: 8.4 mCi F-18 FDG was  injected intravenously. Full-ring PET imaging was performed from Miguel skull base to thigh after Miguel radiotracer. CT data was obtained and used for attenuation correction and anatomic localization. Fasting blood glucose: 105 mg/dl COMPARISON:  MRI lumbar spine of 09/08/2020. Chest CT 08/22/2020. Comparison PET of 11/14/2017 also reviewed. FINDINGS: Mediastinal blood pool activity: SUV max 2.2 Liver activity: SUV max NA NECK: No areas of abnormal hypermetabolism. Incidental CT findings: No cervical adenopathy.  Thyroidectomy. CHEST: Low-level activity corresponding to Miguel mixed attenuation focus within Miguel right apex. Example at a S.U.V. max of 1.6 on 19/8. No thoracic nodal hypermetabolism. Incidental CT findings: Deferred to recent diagnostic CT. No acute superimposed process. Left upper lobectomy. Aortic and coronary artery calcification. ABDOMEN/PELVIS: No abdominopelvic parenchymal or nodal hypermetabolism. Incidental CT findings: Normal adrenal glands. Abdominal aortic atherosclerosis. Mild prostatomegaly. SKELETON: Miguel oval-shaped right paravertebral low-density lesion at Miguel L1 level measures 2.2 cm and a S.U.V. max of 2.7 on 122/4. Similar in size and a S.U.V. max of 2.2 back on 11/14/2017. Miguel L1 lesion of interest demonstrates low-level activity, including at a S.U.V. max of 3.3 on 122/4. This compares to adjacent vertebral bodies which measure on Miguel order of a S.U.V. max of 2.5. Incidental CT findings: Scattered pelvic sclerotic lesions are likely bone islands and are similar. IMPRESSION: 1. Miguel L1 vertebral body lesion demonstrates low-level activity, only minimally greater than surrounding vertebral bodies. This would not be typical of osseous metastasis, and would favor alternate explanation such as aggressive hemangioma. 2. Right paravertebral lesion is similar in size back to 2019 and demonstrates low-level activity, favoring a benign lesion such as a nerve sheath or other neurogenic tumor. 3.  Otherwise, no evidence of metastatic disease. 4. Right apical mixed attenuation lesion with low level activity, possibly an area of post infectious or inflammatory scarring. Indolent primary bronchogenic carcinoma could look similar. Recommend attention on follow-up. 5. Incidental findings, including: Coronary artery atherosclerosis. Aortic Atherosclerosis (ICD10-I70.0). Electronically Signed   By: Abigail Miyamoto M.D.   On: 09/27/2020 15:21     ASSESSMENT AND PLAN: This is a very pleasant 75 years old white male with stage Ia non-small cell lung cancer, adenocarcinoma status post left upper lobectomy with lymph node dissection under Miguel care of Dr. Servando Snare in June 2020. Miguel Wolfe is currently on observation and he is feeling fine except for Miguel intermittent low back pain but he has no neurological symptoms. He was found on Miguel previous scan to have a suspicious  lesion in Miguel L1 vertebral body suspicious for atypical hemangioma but metastatic disease could not be ruled out.  He had MRI of Miguel lumbar spine and recently a PET scan. I personally and independently reviewed Miguel PET scan images and discussed Miguel results with Miguel Wolfe and his wife today.  Miguel PET scan showed Miguel L1 vertebral body lesion with low level activity minimally greater than surrounding vertebral bodies and this would not be typical of osseous metastasis and aggressive hemangioma was still a consideration. I recommended for Miguel Wolfe to seek help from orthopedic surgery or neurosurgery for this condition.  He will get a referral from his primary care physician for Miguel evaluation.  I will make a referral to neurosurgery to see if there is any thing can be done from their side for this Wolfe. Regarding Miguel stage Ia non-small cell lung cancer, I will see Miguel Wolfe back for follow-up visit in February 2023 with repeat CT scan of Miguel chest. He was advised to call immediately if he has any other concerning symptoms in Miguel interval. Miguel  Wolfe voices understanding of current disease status and treatment options and is in agreement with Miguel current care plan.  All questions were answered. Miguel Wolfe knows to call Miguel clinic with any problems, questions or concerns. We can certainly see Miguel Wolfe much sooner if necessary.   Disclaimer: This note was dictated with voice recognition software. Similar sounding words can inadvertently be transcribed and may not be corrected upon review.

## 2020-09-30 ENCOUNTER — Telehealth: Payer: Self-pay | Admitting: Medical Oncology

## 2020-09-30 NOTE — Telephone Encounter (Signed)
Spoke to [place] at [facility] and faxed records for urgent referral.

## 2020-10-04 DIAGNOSIS — Z85118 Personal history of other malignant neoplasm of bronchus and lung: Secondary | ICD-10-CM | POA: Diagnosis not present

## 2020-10-04 DIAGNOSIS — G4700 Insomnia, unspecified: Secondary | ICD-10-CM | POA: Diagnosis not present

## 2020-10-04 DIAGNOSIS — M899 Disorder of bone, unspecified: Secondary | ICD-10-CM | POA: Diagnosis not present

## 2020-10-04 DIAGNOSIS — Z7189 Other specified counseling: Secondary | ICD-10-CM | POA: Diagnosis not present

## 2020-10-05 DIAGNOSIS — D1809 Hemangioma of other sites: Secondary | ICD-10-CM | POA: Diagnosis not present

## 2020-10-05 DIAGNOSIS — I10 Essential (primary) hypertension: Secondary | ICD-10-CM | POA: Diagnosis not present

## 2020-10-06 ENCOUNTER — Telehealth: Payer: Self-pay | Admitting: Medical Oncology

## 2020-10-06 NOTE — Telephone Encounter (Signed)
Hemangioma Pt had appt yesterday with Dr Kathyrn Sheriff. who told pt the hemangioma  looked benign and he would not recommend surgery at this time . He will get an MRI in 6 months.

## 2020-10-17 DIAGNOSIS — Z20822 Contact with and (suspected) exposure to covid-19: Secondary | ICD-10-CM | POA: Diagnosis not present

## 2020-10-20 DIAGNOSIS — Z23 Encounter for immunization: Secondary | ICD-10-CM | POA: Diagnosis not present

## 2020-10-21 ENCOUNTER — Telehealth: Payer: Self-pay | Admitting: Pharmacist

## 2020-10-21 NOTE — Telephone Encounter (Signed)
Pt left message reporting trouble tolerating his cholesterol medications, requesting call back from [place], ok with call back next week.

## 2020-10-25 NOTE — Telephone Encounter (Signed)
Called patient back but unable to reach or leave message. Will try him again later today.

## 2020-10-25 NOTE — Telephone Encounter (Signed)
Spoke with Miguel Wolfe - he is experiencing the symptoms he had with his previous statin (tired, legs hurt, flushed at night). He had been tolerating atorvastatin 10 mg well until ezetimibe was added last month. He stopped taking both of them about a week ago and his symptoms have been getting better. In order to figure out which medication is causing him trouble, he is agreeable to restarting atorvastatin 10 mg daily (continuing to hold ezetimibe) and evaluate if his symptoms return. If so, he will stop the statin and then try ezetimibe alone. He would like to avoid injectable treatment (PCSK9i) if possible but we discussed that this could be a possibility if we can't control his LDL on medications he can tolerate. I will call him on 11/15 to see how he is doing with trying the medications separately and go from there.   I have canceled his lab appt for 11/14/20 while we figure out what regimen he can tolerate and will reschedule in the future.

## 2020-10-31 DIAGNOSIS — Z23 Encounter for immunization: Secondary | ICD-10-CM | POA: Diagnosis not present

## 2020-11-14 ENCOUNTER — Other Ambulatory Visit: Payer: Medicare Other

## 2020-11-22 ENCOUNTER — Telehealth: Payer: Self-pay | Admitting: Student-PharmD

## 2020-11-22 NOTE — Telephone Encounter (Signed)
Called patient to see how he is doing on atorvastatin 10 mg daily after experiencing issues (feeling tired, legs hurt) after adding ezetimibe to the statin. He said he had stopped both for about 9 days and was starting to experience improvement but it was not fully back to baseline when we resumed atorvastatin alone. He reports that his symptoms got progressively worse on the statin and he had a particularly bad week last week so he stopped taking it on Friday.   We discussed plan for him to not take either medication for a few weeks so that his symptoms can return to baseline. Once they have he will resume ezetimibe 10 mg daily (no statin) to see how he does on this alone. If he tolerates it well, will schedule lab work to see how his LDL responds to ezetimibe alone. If he cannot tolerate it, will consider trialing pravastatin 20 mg to see how he tolerates that (intolerant to rosuvastatin 20 mg daily and atorvastatin 10 mg daily). He would like to avoid injectable treatment if possible. Can see if maybe Nexletol is covered in the future if needed.   He is agreeable to this plan. We will touch base again by phone on December 13th.

## 2020-11-29 DIAGNOSIS — Z20822 Contact with and (suspected) exposure to covid-19: Secondary | ICD-10-CM | POA: Diagnosis not present

## 2020-12-16 DIAGNOSIS — M899 Disorder of bone, unspecified: Secondary | ICD-10-CM | POA: Diagnosis not present

## 2020-12-16 DIAGNOSIS — E782 Mixed hyperlipidemia: Secondary | ICD-10-CM | POA: Diagnosis not present

## 2020-12-16 DIAGNOSIS — I25111 Atherosclerotic heart disease of native coronary artery with angina pectoris with documented spasm: Secondary | ICD-10-CM | POA: Diagnosis not present

## 2020-12-16 DIAGNOSIS — Z85850 Personal history of malignant neoplasm of thyroid: Secondary | ICD-10-CM | POA: Diagnosis not present

## 2020-12-16 DIAGNOSIS — Z85118 Personal history of other malignant neoplasm of bronchus and lung: Secondary | ICD-10-CM | POA: Diagnosis not present

## 2020-12-16 DIAGNOSIS — R7303 Prediabetes: Secondary | ICD-10-CM | POA: Diagnosis not present

## 2020-12-16 DIAGNOSIS — I10 Essential (primary) hypertension: Secondary | ICD-10-CM | POA: Diagnosis not present

## 2020-12-16 DIAGNOSIS — Z20822 Contact with and (suspected) exposure to covid-19: Secondary | ICD-10-CM | POA: Diagnosis not present

## 2020-12-16 DIAGNOSIS — E890 Postprocedural hypothyroidism: Secondary | ICD-10-CM | POA: Diagnosis not present

## 2020-12-16 DIAGNOSIS — K219 Gastro-esophageal reflux disease without esophagitis: Secondary | ICD-10-CM | POA: Diagnosis not present

## 2020-12-16 DIAGNOSIS — G4700 Insomnia, unspecified: Secondary | ICD-10-CM | POA: Diagnosis not present

## 2020-12-19 DIAGNOSIS — L219 Seborrheic dermatitis, unspecified: Secondary | ICD-10-CM | POA: Diagnosis not present

## 2020-12-19 DIAGNOSIS — Z23 Encounter for immunization: Secondary | ICD-10-CM | POA: Diagnosis not present

## 2020-12-20 ENCOUNTER — Telehealth: Payer: Self-pay | Admitting: Student-PharmD

## 2020-12-20 DIAGNOSIS — E7800 Pure hypercholesterolemia, unspecified: Secondary | ICD-10-CM

## 2020-12-20 NOTE — Telephone Encounter (Signed)
Called patient to see how he is doing on ezetimibe alone after statin washout. He denies muscle pain like he had when he was on atorvastatin. He does have some muscle tiredness at night but he attributes this to old age. He isn't having any severe muscle pain when he wakes up like he had before. Overall tolerating ezetimibe well. He had labs checked at his PCP visit 12/16/20 which showed an elevated LDL from previous but this makes sense since he had only been on ezetimibe for about 10 days when this was checked.   Labs: 12/16/20 (from [facility]) TC 190 HDL 45 LDL 120 (goal <70 mg/dL) TG 137   Will have him continue ezetimibe 10 mg daily for now so we can see what effect this has on his LDL alone. He has an appt with [facility] 01/25/21 so I scheduled his lab appt for that morning as well to check fasting lipid panel and LFTs. He knows that he can come anytime after 7:30am for these. If LDL is above goal at that time will consider adding pravastatin 20 mg daily and titrating up to his maximally tolerated dose. He reports tolerating pravastatin well many years ago. He is agreeable to this plan and thanks me for the call.

## 2021-01-13 DIAGNOSIS — N50812 Left testicular pain: Secondary | ICD-10-CM | POA: Diagnosis not present

## 2021-01-14 DIAGNOSIS — Z20822 Contact with and (suspected) exposure to covid-19: Secondary | ICD-10-CM | POA: Diagnosis not present

## 2021-01-17 ENCOUNTER — Other Ambulatory Visit: Payer: Self-pay | Admitting: Family Medicine

## 2021-01-17 ENCOUNTER — Ambulatory Visit
Admission: RE | Admit: 2021-01-17 | Discharge: 2021-01-17 | Disposition: A | Discharge status: Home / Self Care | Payer: Medicare Other | Source: Ambulatory Visit | Attending: Family Medicine | Admitting: Family Medicine

## 2021-01-17 DIAGNOSIS — N50812 Left testicular pain: Secondary | ICD-10-CM

## 2021-01-18 ENCOUNTER — Other Ambulatory Visit: Payer: Medicare Other

## 2021-01-23 ENCOUNTER — Encounter: Payer: Self-pay | Admitting: Physician Assistant

## 2021-01-23 NOTE — Progress Notes (Addendum)
Cardiology Office Note    Date:  01/25/2021   ID:  PERSEUS WESTALL, DOB 03-29-1945, MRN [id number]  PCP:  [name], MD  Cardiologist:  Candee Furbish, MD  Electrophysiologist:  None   Chief Complaint: f/u CAD  History of Present Illness:   Miguel Wolfe is a 76 y.o. 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 He established care in 2018 for dyspnea on exertion and had abnormal stress test. Cath 02/2016 is outlined below with DES to mid RCA with mild nonobstructive disease in Cx and LAD. US aorta was without aneurysm 03/2019. Last echo 05/2020 EF 60-65%, mild LVH, grade 1 DD, normal RV, borderline dilation of aortic root. He has been following with pharmD team for h/o statin intolerance with both atorvastatin and rosuvastatin. He's since been on Zetia. He is also followed with oncology for his malignancy history with findings on imaging in L1 vertebral body 8-09/2020 suspicious for atypical hemangioma, felt not typical of osseous metastasis, with recommendation to see ortho/neurosurg for their input. He states he is being monitored with serial CTs for now. He used to work for [employer].  He is seen back for follow-up overall doing well. He states he's had chronic DOE since lobectomy but this is stable. He stopped going to the gym due to Covid, but does continue to walk 20 minutes daily with his wife without functional limitation. No angina, orthopnea, edema, palpitations or syncope. States his BP runs around 737T systolic at home. He is on propranolol specifically as he used to do public speaking and this helped with the anxiety. He is tolerating Zetia without any of the issues he had on rosuvastatin or atorvastatin. He does comment that he was remotely on simvastatin and seemed to do OK with this.   Labwork independently  reviewed: 12/2020 PCP labs per phone note 12/20/20 TC 190, HDL 45, LDL 120, TG 137 09/2020 AST/ALT wnl, LDL 94 08/2020 K 3.9, Cr 0.85, Hgb 15.7, plt 165 03/2020 TSH 0.41 KPN   Cardiology Studies:   Studies reviewed are outlined and summarized above. Reports included below if pertinent.   2D Echo 05/2020  1. Left ventricular ejection fraction, by estimation, is 60 to 65%. The  left ventricle has normal function. The left ventricle has no regional  wall motion abnormalities. There is mild left ventricular hypertrophy.  Left ventricular diastolic parameters  are consistent with Grade I diastolic dysfunction (impaired relaxation).   2. Right ventricular systolic function is normal. The right ventricular  size is normal. There is normal pulmonary artery systolic pressure. The  estimated right ventricular systolic pressure is 06.2 mmHg.   3. The mitral valve is normal in structure. No evidence of mitral valve  regurgitation. No evidence of mitral stenosis.   Cath 2/20218 Acute Mrg lesion, 90 %stenosed. Mid RCA lesion, 20 %stenosed. Dist RCA lesion, 30 %stenosed. Prox Cx to Mid Cx lesion, 20 %stenosed. 1st Diag lesion, 20 %stenosed. Dist LAD lesion, 20 %stenosed. A STENT SYNERGY DES 4X24 drug eluting stent was successfully placed. Prox RCA to Mid RCA lesion, 80 %stenosed. Post intervention, there is a 0% residual stenosis.   1. Severe single vessel CAD (severe stenosis mid RCA) 2. Successful PTCA/DES x 1 mid RCA  3. Mild non-obstructive disease Circumflex and LAD   Recommendations: Will continue DAPT with ASA and Plavix for  one year. If he needs to interrupt anti-platelet therapy for a toe surgery, this could be considered at 3 months since a Synergy DES was placed, although it would be optimal to continue for at least 6 months. Will discharge home today as part of the same day PCI protocol. Will changed Prilosec to Protonix 40 mg daily. He will be started on Plavix 75 mg daily. Follow up  with Dr. Marlou Porch or office APP in 1 week.     4. The aortic valve is normal in structure. Aortic valve regurgitation is  mild to moderate. No aortic stenosis is present.   5. There is borderline dilatation of the aortic root, measuring 40 mm.   6. The inferior vena cava is normal in size with greater than 50%  respiratory variability, suggesting right atrial pressure of 3 mmHg.     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   PONV (postoperative nausea and vomiting)    Pre-diabetes    Pulmonary nodules    Thyroid cancer ([place])     Past Surgical History:  Procedure Laterality Date   CARDIAC CATHETERIZATION     COLONOSCOPY WITH ESOPHAGOGASTRODUODENOSCOPY (EGD)  2008   CORONARY STENT INTERVENTION N/A 02/20/2016   Procedure: Coronary Stent Intervention;  Surgeon: Burnell Blanks, MD;  Location: [facility];  Service: Cardiovascular;  Laterality: N/A;   ESOPHAGOGASTRODUODENOSCOPY     LEFT HEART CATH AND CORONARY ANGIOGRAPHY N/A 02/20/2016   Procedure: Left Heart Cath and Coronary Angiography;  Surgeon: Burnell Blanks, MD;  Location: [facility];  Service: Cardiovascular;  Laterality: N/A;   PILONIDAL CYCT     REFRACTIVE SURGERY     retina tear repair   THYROIDECTOMY N/A 01/17/2018   Procedure: TOTAL THYROIDECTOMY;  Surgeon: Armandina Gemma, MD;  Location: [facility];  Service: General;  Laterality: N/A;   VIDEO ASSISTED THORACOSCOPY (VATS)/WEDGE RESECTION Left 06/24/2018   Procedure: VIDEO ASSISTED  THORACOSCOPY (VATS)/LUNG RESECTION;  Surgeon: Grace Isaac, MD;  Location: [facility];  Service: Thoracic;  Laterality: Left;   VIDEO BRONCHOSCOPY WITH ENDOBRONCHIAL NAVIGATION N/A 11/18/2017   Procedure: VIDEO BRONCHOSCOPY WITH ENDOBRONCHIAL NAVIGATION WITH BIOPSIES OF LEFT AND RIGHT UPPER LOBES;  Surgeon: Grace Isaac, MD;  Location: [facility];  Service: Thoracic;  Laterality: N/A;   VIDEO BRONCHOSCOPY WITH ENDOBRONCHIAL NAVIGATION N/A 06/11/2018   Procedure: VIDEO BRONCHOSCOPY WITH ENDOBRONCHIAL NAVIGATION;  Surgeon: Grace Isaac, MD;  Location: [place];  Service: Thoracic;  Laterality: N/A;    Current Medications: Current Meds  Medication Sig   acetaminophen (TYLENOL) 325 MG tablet Take 650 mg by mouth every 6 (six) hours as needed for moderate pain or headache.    amLODipine (NORVASC) 10 MG tablet Take 10 mg by mouth daily.   aspirin EC 81 MG tablet Take 81 mg by mouth daily.   ezetimibe (ZETIA) 10 MG tablet Take 1 tablet (10  mg total) by mouth daily.   famotidine (PEPCID) 20 MG tablet Take 20 mg by mouth daily.   ibuprofen (ADVIL,MOTRIN) 200 MG tablet Take 400 mg by mouth every 8 (eight) hours as needed for headache or moderate pain.    levothyroxine (SYNTHROID) 175 MCG tablet Take 175 mcg by mouth daily.   losartan (COZAAR) 100 MG tablet Take 100 mg by mouth daily.   nitroGLYCERIN (NITROSTAT) 0.4 MG SL tablet Place 1 tablet (0.4 mg total) under the tongue every 5 (five) minutes as needed for chest pain.   Omega-3 Fatty Acids (FISH OIL) 1000 MG CAPS Take 2 capsules by mouth in the morning and at bedtime.   pantoprazole (PROTONIX) 40 MG tablet Take 40 mg by mouth every other day.   propranolol (INDERAL) 20 MG tablet Take 20 mg by mouth 2 (two) times daily.   zolpidem (AMBIEN) 10 MG tablet Take 10 mg by mouth at bedtime as needed for sleep.       Allergies:   Gemfibrozil, Other, Propranolol, Niaspan [niacin er], and Simvastatin   Social History   Socioeconomic History    Marital status: Married    Spouse name: Not on file   Number of children: Not on file   Years of education: Not on file   Highest education level: Not on file  Occupational History   Not on file  Tobacco Use   Smoking status: Former    Packs/day: 1.00    Years: 3.00    Pack years: 3.00    Types: Cigarettes    Quit date: 02/01/1967    Years since quitting: 54.0   Smokeless tobacco: Never  Vaping Use   Vaping Use: Never used  Substance and Sexual Activity   Alcohol use: Yes    Comment: a drink before dinner a few times a week   Drug use: No   Sexual activity: Not on file  Other Topics Concern   Not on file  Social History Narrative   Not on file   Social Determinants of Health   Financial Resource Strain: Not on file  Food Insecurity: Not on file  Transportation Needs: Not on file  Physical Activity: Not on file  Stress: Not on file  Social Connections: Not on file     Family History:  The patient's family history includes Cancer in his paternal uncle and paternal uncle; Colon cancer (age of onset: 69) in his cousin; Crohn's disease in his sister; Healthy in his sister; Heart attack (age of onset: 27) in his father; Stomach cancer in his paternal uncle; Throat cancer in his paternal uncle; Thyroid cancer in his daughter.  ROS:   Please see the history of present illness.  All other systems are reviewed and otherwise negative.    EKG(s)/Additional Labs   EKG:  EKG is ordered today, personally reviewed, demonstrating NSR 63bpm, left axis deviation, no acute STT changes  Recent Labs: 08/22/2020: BUN 15; Creatinine 0.85; Hemoglobin 15.7; Platelet Count 165; Potassium 3.9; Sodium 143 09/15/2020: ALT 13  Recent Lipid Panel    Component Value Date/Time   CHOL 170 09/15/2020 0731   TRIG 146 09/15/2020 0731   HDL 50 09/15/2020 0731   CHOLHDL 3.4 09/15/2020 0731   LDLCALC 94 09/15/2020 0731    PHYSICAL EXAM:    VS:  BP 132/78    Pulse 63    Ht 5\' 11"  (1.803 m)    Wt  169 lb 3.2 oz (76.7 kg)    SpO2 97%  BMI 23.60 kg/m   BMI: Body mass index is 23.6 kg/m.  GEN: Well nourished, well developed male in no acute distress HEENT: normocephalic, atraumatic Neck: no JVD, carotid bruits, or masses Cardiac: RRR; no murmurs, rubs, or gallops, no edema  Respiratory: somewhat decreased BS right lung base, otherwise clear to auscultation bilaterally, normal work of breathing GI: soft, nontender, nondistended, + BS MS: no deformity or atrophy Skin: warm and dry, no rash Neuro:  Alert and Oriented x 3, Strength and sensation are intact, follows commands Psych: euthymic mood, full affect  Wt Readings from Last 3 Encounters:  01/25/21 169 lb 3.2 oz (76.7 kg)  09/29/20 168 lb 8 oz (76.4 kg)  09/23/20 169 lb (76.7 kg)     ASSESSMENT & PLAN:   1. CAD with HLD goal LDL <70 - clinically stable without any accelerating symptoms. Continue ASA, beta blocker, Zetia. He had his repeat lipids/LFTs today. He does report he was remotely on simvastatin and willing to re-trial if needed. This would be dose-limited by drug interaction with amlodipine.  Will await results. PharmD team has been managing this. I answered his questions regarding PCSK9i today.  2. Essential HTN - borderline blood pressure control noted today. We need a better idea of what it's running at home. Per shared decision making, the patient was provided instructions on monitoring blood pressure at home for 1 week and relaying results to our office. Would consider switching propranolol to carvedilol if BP running >416 systolic consistently at home.  3. Aortic insufficiency - noted on echo 05/2020, will repeat 05/2021.  4. Borderline dilatation of aortic root - noted on echo 05/2020, will repeat 05/2021.  5. Covid-19 virus education - patient had questions about Covid treatment and prevention as he is very careful given his medical history. We discussed importance of masking and vaccination. If he were to require rx  for Covid, would need to weigh risk/benefit of drug interaction of paxlovid versus alternative rx with molnupirivir. He is fully vaccinated and up to date with boosters.   6. H/o lung CA, thyroid CA - he is following closely with his oncology team. He is noted to have slightly decreased BS on the right lung base on examination today. After his visit I was able to more thoroughly review prior studies and exam notes. Per CT scan 08/2020, this showed "stable ground-glass opacity in the right lung apex with internal linear soft tissue components with recommendation for continued attention on follow-up recommended." PET scan 09/2020 showed "right apical mixed attenuation lesion with low level activity, possibly an area of post infectious or inflammatory scarring. Indolent primary bronchogenic carcinoma could look similar. Recommend attention on follow-up." His other physical examinations noted clear lung sounds bilaterally. I will reach out to his oncologist to alert him of this physical exam finding in case testing needs to be moved up. He had a planned CT scan in February. If need be, could consider interim CXR, but will await oncology recommendations. He is not complaining of any orthopnea or accelerating dyspnea. No other signs of HF on exam. Discussed finding with patient.  Disposition: F/u with Dr. Marlou Porch in 6 months.  Medication Adjustments/Labs and Tests Ordered: Current medicines are reviewed at length with the patient today.  Concerns regarding medicines are outlined above. Medication changes, Labs and Tests ordered today are summarized above and listed in the Patient Instructions accessible in Encounters.   Signed, Charlie Pitter, PA-C  01/25/2021 9:42 AM    [facility] Phone: [phone]; Fax: [phone]

## 2021-01-25 ENCOUNTER — Telehealth: Payer: Self-pay | Admitting: Physician Assistant

## 2021-01-25 ENCOUNTER — Other Ambulatory Visit: Payer: Self-pay

## 2021-01-25 ENCOUNTER — Encounter: Payer: Self-pay | Admitting: Physician Assistant

## 2021-01-25 ENCOUNTER — Ambulatory Visit (INDEPENDENT_AMBULATORY_CARE_PROVIDER_SITE_OTHER): Payer: Medicare Other | Admitting: Physician Assistant

## 2021-01-25 ENCOUNTER — Other Ambulatory Visit: Payer: Medicare Other | Admitting: *Deleted

## 2021-01-25 VITALS — BP 132/78 | HR 63 | Ht 71.0 in | Wt 169.2 lb

## 2021-01-25 DIAGNOSIS — E785 Hyperlipidemia, unspecified: Secondary | ICD-10-CM | POA: Diagnosis not present

## 2021-01-25 DIAGNOSIS — E7800 Pure hypercholesterolemia, unspecified: Secondary | ICD-10-CM | POA: Diagnosis not present

## 2021-01-25 DIAGNOSIS — R0989 Other specified symptoms and signs involving the circulatory and respiratory systems: Secondary | ICD-10-CM

## 2021-01-25 DIAGNOSIS — I2510 Atherosclerotic heart disease of native coronary artery without angina pectoris: Secondary | ICD-10-CM | POA: Diagnosis not present

## 2021-01-25 DIAGNOSIS — Z85118 Personal history of other malignant neoplasm of bronchus and lung: Secondary | ICD-10-CM | POA: Diagnosis not present

## 2021-01-25 DIAGNOSIS — I77819 Aortic ectasia, unspecified site: Secondary | ICD-10-CM | POA: Diagnosis not present

## 2021-01-25 DIAGNOSIS — I10 Essential (primary) hypertension: Secondary | ICD-10-CM | POA: Diagnosis not present

## 2021-01-25 DIAGNOSIS — Z7189 Other specified counseling: Secondary | ICD-10-CM

## 2021-01-25 DIAGNOSIS — I351 Nonrheumatic aortic (valve) insufficiency: Secondary | ICD-10-CM | POA: Diagnosis not present

## 2021-01-25 DIAGNOSIS — N50812 Left testicular pain: Secondary | ICD-10-CM | POA: Diagnosis not present

## 2021-01-25 DIAGNOSIS — N400 Enlarged prostate without lower urinary tract symptoms: Secondary | ICD-10-CM | POA: Diagnosis not present

## 2021-01-25 LAB — HEPATIC FUNCTION PANEL
ALT: 19 IU/L (ref 0–44)
AST: 21 IU/L (ref 0–40)
Albumin: 4.9 g/dL — ABNORMAL HIGH (ref 3.7–4.7)
Alkaline Phosphatase: 78 IU/L (ref 44–121)
Bilirubin Total: 0.8 mg/dL (ref 0.0–1.2)
Bilirubin, Direct: 0.12 mg/dL (ref 0.00–0.40)
Total Protein: 6.7 g/dL (ref 6.0–8.5)

## 2021-01-25 LAB — LIPID PANEL
Chol/HDL Ratio: 3.8 ratio (ref 0.0–5.0)
Cholesterol, Total: 192 mg/dL (ref 100–199)
HDL: 51 mg/dL (ref >39)
LDL Chol Calc (NIH): 116 mg/dL — ABNORMAL HIGH (ref 0–99)
Triglycerides: 143 mg/dL (ref 0–149)
VLDL Cholesterol Cal: 25 mg/dL (ref 5–40)

## 2021-01-25 NOTE — Telephone Encounter (Addendum)
I placed call to patient post-visit to review additional studies that I had reviewed more in depth after our visit. LMOM to call back. When he returns call, please transfer to pod E today Jeanann Lewandowsky working with me). Addendum: pt returned call back, spoke with him, will close out phone note.

## 2021-01-25 NOTE — Patient Instructions (Addendum)
Medication Instructions:  Your physician recommends that you continue on your current medications as directed. Please refer to the Current Medication list given to you today.  *If you need a refill on your cardiac medications before your next appointment, please call your pharmacy*   Lab Work: None ordered  If you have labs (blood work) drawn today and your tests are completely normal, you will receive your results only by: [place] (if you have MyChart) OR A paper copy in the mail If you have any lab test that is abnormal or we need to change your treatment, we will call you to review the results.   Testing/Procedures: Your physician has requested that you have an echocardiogram IN 05/2021 Echocardiography is a painless test that uses sound waves to create images of your heart. It provides your doctor with information about the size and shape of your heart and how well your hearts chambers and valves are working. This procedure takes approximately one hour. There are no restrictions for this procedure.    Follow-Up: At [facility], you and your health needs are our priority.  As part of our continuing mission to provide you with exceptional heart care, we have created designated Provider Care Teams.  These Care Teams include your primary Cardiologist (physician) and Advanced Practice Providers (APPs -  Physician Assistants and Nurse Practitioners) who all work together to provide you with the care you need, when you need it.  We recommend signing up for the patient portal called "MyChart".  Sign up information is provided on this After Visit Summary.  MyChart is used to connect with patients for Virtual Visits (Telemedicine).  Patients are able to view lab/test results, encounter notes, upcoming appointments, etc.  Non-urgent messages can be sent to your provider as well.   To learn more about what you can do with MyChart, go to [email or web address].    Your next appointment:    6 month(s)  The format for your next appointment:   In Person  Provider:   Candee Furbish, MD  or Melina Copa, PA-C         Other Instructions  We need to get a better idea of what your blood pressure is running at home. Here are some instructions to follow: - I would recommend using a blood pressure cuff that goes on your arm. The wrist ones can be inaccurate. If you're purchasing one for the first time, try to select one that also reports your heart rate because this can be helpful information as well. - To check your blood pressure, choose a time at least 3 hours after taking your blood pressure medicines. If you can sample it at different times of the day, that's great - it might give you more information about how your blood pressure fluctuates. Remain seated in a chair for 5 minutes quietly beforehand, then check it.  - Please record a list of those readings and call us/send in MyChart message with them for our review in 1 week.

## 2021-01-26 ENCOUNTER — Telehealth: Payer: Self-pay | Admitting: Student-PharmD

## 2021-01-26 ENCOUNTER — Ambulatory Visit
Admission: RE | Admit: 2021-01-26 | Discharge: 2021-01-26 | Disposition: A | Discharge status: Home / Self Care | Payer: Medicare Other | Source: Ambulatory Visit | Attending: Physician Assistant | Admitting: Physician Assistant

## 2021-01-26 DIAGNOSIS — R0989 Other specified symptoms and signs involving the circulatory and respiratory systems: Secondary | ICD-10-CM

## 2021-01-26 DIAGNOSIS — Z85118 Personal history of other malignant neoplasm of bronchus and lung: Secondary | ICD-10-CM | POA: Diagnosis not present

## 2021-01-26 MED ORDER — PRAVASTATIN SODIUM 80 MG PO TABS: 80.0000 mg | ORAL_TABLET | Freq: Every day | ORAL | 3 refills | Status: DC

## 2021-01-26 NOTE — Progress Notes (Signed)
Pt has been made aware of normal result and verbalized understanding.  jw

## 2021-01-26 NOTE — Addendum Note (Signed)
Addended by: Gaetano Net on: 01/26/2021 08:25 AM   Modules accepted: Orders

## 2021-01-26 NOTE — Telephone Encounter (Signed)
Call placed to pt.  He will go to [facility], today, for chest x-ray. He is aware that someone will be reaching out to him from our Pharmacist team regarding his lab results.  Pt was very appreciative of the call.

## 2021-01-26 NOTE — Telephone Encounter (Signed)
Please let pt know I reviewed his exam finding with Dr. Julien Nordmann who thanked me for the update and recommended to keep plan for follow-up scan and appt next month. I would recommend in the interim we go ahead and get a f/u 2V CXR to be able to compare to the prior one. Diagnosis: abnormal lung exam or abnormal breath sounds.  I also sent his lab result note to pharmD team so he should expect some feedback from them as well.

## 2021-01-26 NOTE — Telephone Encounter (Signed)
LDL from 01/25/21 is 116 (LDL goal <70 mg/dL) on ezetimibe alone. He is intolerant to rosuvastatin and atorvastatin. He reports that years ago he tolerated pravastatin well with no adverse effects that he remembers. He had been on 40 and 80 mg without issue. Discussed retrial of pravastatin 80 mg, which should lower LDL by 30-50%. He needs 40% lowering to bring LDL to goal. He is willing to retry pravastatin and continue on ezetimibe.   Will call him in 2-3 weeks to see how he is doing with this regimen since his symptoms have usually started within about 2 weeks of starting statins. Once we find a regimen he is tolerating well I will schedule him for follow up labs to assess for efficacy.

## 2021-01-30 DIAGNOSIS — Z8619 Personal history of other infectious and parasitic diseases: Secondary | ICD-10-CM | POA: Diagnosis not present

## 2021-01-30 DIAGNOSIS — L219 Seborrheic dermatitis, unspecified: Secondary | ICD-10-CM | POA: Diagnosis not present

## 2021-01-30 DIAGNOSIS — Z23 Encounter for immunization: Secondary | ICD-10-CM | POA: Diagnosis not present

## 2021-02-10 MED ORDER — CARVEDILOL 6.25 MG PO TABS: 6.2500 mg | ORAL_TABLET | Freq: Two times a day (BID) | ORAL | 3 refills | Status: DC

## 2021-02-10 NOTE — Telephone Encounter (Signed)
Called patient back with Dayna Dunn PA's response. Patient will stop propranolol and start carvedilol 6.25 mg BID. Patient will submit BP readings in 2 weeks.

## 2021-02-10 NOTE — Telephone Encounter (Signed)
BP reviewed. Several of his BPs are still elevated above our threshold goal of <130. Recommend d/c propranolol and start carvedilol 6.25mg  BID.  Submit BP readings 2 weeks after starting. Thanks! Darryel Diodato PA-C

## 2021-02-14 ENCOUNTER — Encounter: Payer: Self-pay | Admitting: *Deleted

## 2021-02-14 ENCOUNTER — Telehealth: Payer: Self-pay | Admitting: Cardiology

## 2021-02-14 ENCOUNTER — Telehealth: Payer: Self-pay | Admitting: Student-PharmD

## 2021-02-14 DIAGNOSIS — R7303 Prediabetes: Secondary | ICD-10-CM | POA: Diagnosis not present

## 2021-02-14 DIAGNOSIS — E785 Hyperlipidemia, unspecified: Secondary | ICD-10-CM

## 2021-02-14 DIAGNOSIS — E890 Postprocedural hypothyroidism: Secondary | ICD-10-CM | POA: Diagnosis not present

## 2021-02-14 DIAGNOSIS — R7309 Other abnormal glucose: Secondary | ICD-10-CM | POA: Diagnosis not present

## 2021-02-14 DIAGNOSIS — I2510 Atherosclerotic heart disease of native coronary artery without angina pectoris: Secondary | ICD-10-CM

## 2021-02-14 DIAGNOSIS — R0789 Other chest pain: Secondary | ICD-10-CM

## 2021-02-14 DIAGNOSIS — Z85850 Personal history of malignant neoplasm of thyroid: Secondary | ICD-10-CM | POA: Diagnosis not present

## 2021-02-14 NOTE — Telephone Encounter (Signed)
Pt states for about a week now he has been having intermittent chest pressure.  He thought it was related to reflux or indigestion.  Has been taking medications every morning for that and has had no improvement.  Improved after he stopped drinking coffee and switched to a bland diet 3 days ago.  Worse in the evenings and at night.  Symptoms do not seem to correlate with eating meals.  Only other symptom is sometimes he has pain in his back teeth.  Denies sweating, SOB, dizziness, lightheadedness or vision issues.  States O2 is normal.  Checked BP and HR prior to calling, 131/67, 76.  Has Nitroglycerin but states he doesn't feel like the discomfort is enough to warrant taking one.  Advised I will send message to Dr. Marlou Porch and his nurse for review.  Advised one of [place] will call him back as soon as we hear from Dr. Marlou Porch.

## 2021-02-14 NOTE — Telephone Encounter (Signed)
Called patient to see how he is doing since starting pravastatin 80 mg. He reports being able to tolerate this well with no change in his muscle pain like he has experienced with atorvastatin and rosuvastatin in the past. He noticed having some flushing for a couple of days but this has improved.   He notes that he has had more acid reflux lately. He recently switched propranolol to carvedilol so he is wondering if this could be related to this medication change or from pravastatin. Explained that would not expect worsened acid reflux from either of these medications. He takes pantoprazole and famotidine for acid reflux and has used some Rolaids recently. Counseled patient that if acid reflux does not improve with avoiding trigger foods to reach out to his doctor.   Since he is tolerating pravastatin well, scheduled patient for fasting labs on 03/13/21. Goal LDL <70 mg/dL.

## 2021-02-14 NOTE — Telephone Encounter (Signed)
Pt c/o of Chest Pain: STAT if CP now or developed within 24 hours  1. Are you having CP right now?  Denies pain, states he is having chest pressure   2. Are you experiencing any other symptoms (ex. SOB, nausea, vomiting, sweating)?  Pain in back teeth   3. How long have you been experiencing CP?  About 1 week  4. Is your CP continuous or coming and going?  Coming and going   5. Have you taken Nitroglycerin?  No  ?

## 2021-02-14 NOTE — Telephone Encounter (Signed)
Called pt and made him aware of recommendation for Lexiscan from Dr. Marlou Porch.  Pt agreeable to plan.  Advised I will place order and scheduler will contact him to schedule.  Reviewed instructions and advised I will place a copy of the instruction letter in [place] as well.  Pt verbalized understanding and was in agreement with plan.    Will route to Dr. Marlou Porch to sign attestation.

## 2021-02-14 NOTE — Telephone Encounter (Signed)
Call transferred to [facility], [place].

## 2021-02-15 ENCOUNTER — Encounter (HOSPITAL_COMMUNITY): Payer: Self-pay | Admitting: Cardiology

## 2021-02-15 ENCOUNTER — Telehealth (HOSPITAL_COMMUNITY): Payer: Self-pay

## 2021-02-15 NOTE — Telephone Encounter (Signed)
Spoke with the patient, detailed instructions were given. He stated that he understood and would be here for his test. Asked to call back with any questions. S.Reinhart Saulters EMTP

## 2021-02-16 ENCOUNTER — Ambulatory Visit (HOSPITAL_COMMUNITY): Payer: Medicare Other | Attending: Cardiology

## 2021-02-16 ENCOUNTER — Other Ambulatory Visit: Payer: Self-pay

## 2021-02-16 DIAGNOSIS — R0789 Other chest pain: Secondary | ICD-10-CM | POA: Insufficient documentation

## 2021-02-16 DIAGNOSIS — Z20822 Contact with and (suspected) exposure to covid-19: Secondary | ICD-10-CM | POA: Diagnosis not present

## 2021-02-16 DIAGNOSIS — I2510 Atherosclerotic heart disease of native coronary artery without angina pectoris: Secondary | ICD-10-CM | POA: Diagnosis not present

## 2021-02-16 LAB — MYOCARDIAL PERFUSION IMAGING
LV dias vol: 122 mL (ref 62–150)
LV sys vol: 50 mL
Nuc Stress EF: 59 %
Peak HR: 95 {beats}/min
Rest HR: 73 {beats}/min
Rest Nuclear Isotope Dose: 10.1 mCi
SDS: 0
SRS: 0
SSS: 0
ST Depression (mm): 0 mm
Stress Nuclear Isotope Dose: 32.6 mCi
TID: 1.03

## 2021-02-16 MED ORDER — REGADENOSON 0.4 MG/5ML IV SOLN
0.4000 mg | Freq: Once | INTRAVENOUS | Status: AC
  Administered 2021-02-16: 0.4 mg via INTRAVENOUS

## 2021-02-16 MED ORDER — TECHNETIUM TC 99M TETROFOSMIN IV KIT
32.6000 | PACK | Freq: Once | INTRAVENOUS | Status: AC | PRN
  Administered 2021-02-16: 32.6 via INTRAVENOUS
  Filled 2021-02-16: qty 33

## 2021-02-16 MED ORDER — TECHNETIUM TC 99M TETROFOSMIN IV KIT
10.1000 | PACK | Freq: Once | INTRAVENOUS | Status: AC | PRN
  Administered 2021-02-16: 10.1 via INTRAVENOUS
  Filled 2021-02-16: qty 11

## 2021-02-22 DIAGNOSIS — Z85850 Personal history of malignant neoplasm of thyroid: Secondary | ICD-10-CM | POA: Diagnosis not present

## 2021-02-22 DIAGNOSIS — R7303 Prediabetes: Secondary | ICD-10-CM | POA: Diagnosis not present

## 2021-02-22 DIAGNOSIS — Z9889 Other specified postprocedural states: Secondary | ICD-10-CM | POA: Diagnosis not present

## 2021-02-22 DIAGNOSIS — E890 Postprocedural hypothyroidism: Secondary | ICD-10-CM | POA: Diagnosis not present

## 2021-02-24 ENCOUNTER — Other Ambulatory Visit: Payer: Medicare Other

## 2021-02-27 ENCOUNTER — Ambulatory Visit: Payer: Medicare Other | Admitting: Internal Medicine

## 2021-03-01 ENCOUNTER — Other Ambulatory Visit: Payer: Self-pay

## 2021-03-01 ENCOUNTER — Inpatient Hospital Stay: Payer: Medicare Other | Attending: Internal Medicine

## 2021-03-01 ENCOUNTER — Ambulatory Visit (HOSPITAL_COMMUNITY)
Admission: RE | Admit: 2021-03-01 | Discharge: 2021-03-01 | Disposition: A | Discharge status: Home / Self Care | Payer: Medicare Other | Source: Ambulatory Visit | Attending: Internal Medicine | Admitting: Internal Medicine

## 2021-03-01 ENCOUNTER — Encounter (HOSPITAL_COMMUNITY): Payer: Self-pay

## 2021-03-01 DIAGNOSIS — J439 Emphysema, unspecified: Secondary | ICD-10-CM | POA: Diagnosis not present

## 2021-03-01 DIAGNOSIS — I10 Essential (primary) hypertension: Secondary | ICD-10-CM | POA: Insufficient documentation

## 2021-03-01 DIAGNOSIS — D1800 Hemangioma unspecified site: Secondary | ICD-10-CM | POA: Insufficient documentation

## 2021-03-01 DIAGNOSIS — Z800 Family history of malignant neoplasm of digestive organs: Secondary | ICD-10-CM | POA: Insufficient documentation

## 2021-03-01 DIAGNOSIS — Z902 Acquired absence of lung [part of]: Secondary | ICD-10-CM | POA: Insufficient documentation

## 2021-03-01 DIAGNOSIS — Z85118 Personal history of other malignant neoplasm of bronchus and lung: Secondary | ICD-10-CM | POA: Insufficient documentation

## 2021-03-01 DIAGNOSIS — G8929 Other chronic pain: Secondary | ICD-10-CM | POA: Insufficient documentation

## 2021-03-01 DIAGNOSIS — C3490 Malignant neoplasm of unspecified part of unspecified bronchus or lung: Secondary | ICD-10-CM | POA: Insufficient documentation

## 2021-03-01 DIAGNOSIS — I700 Atherosclerosis of aorta: Secondary | ICD-10-CM | POA: Diagnosis not present

## 2021-03-01 DIAGNOSIS — C3411 Malignant neoplasm of upper lobe, right bronchus or lung: Secondary | ICD-10-CM

## 2021-03-01 LAB — CBC WITH DIFFERENTIAL (CANCER CENTER ONLY)
Abs Immature Granulocytes: 0.01 10*3/uL (ref 0.00–0.07)
Basophils Absolute: 0 10*3/uL (ref 0.0–0.1)
Basophils Relative: 1 %
Eosinophils Absolute: 0.4 10*3/uL (ref 0.0–0.5)
Eosinophils Relative: 7 %
HCT: 44.6 % (ref 39.0–52.0)
Hemoglobin: 15.2 g/dL (ref 13.0–17.0)
Immature Granulocytes: 0 %
Lymphocytes Relative: 36 %
Lymphs Abs: 1.9 10*3/uL (ref 0.7–4.0)
MCH: 30.3 pg (ref 26.0–34.0)
MCHC: 34.1 g/dL (ref 30.0–36.0)
MCV: 88.8 fL (ref 80.0–100.0)
Monocytes Absolute: 0.5 10*3/uL (ref 0.1–1.0)
Monocytes Relative: 10 %
Neutro Abs: 2.4 10*3/uL (ref 1.7–7.7)
Neutrophils Relative %: 46 %
Platelet Count: 152 10*3/uL (ref 150–400)
RBC: 5.02 MIL/uL (ref 4.22–5.81)
RDW: 12.3 % (ref 11.5–15.5)
WBC Count: 5.2 10*3/uL (ref 4.0–10.5)
nRBC: 0 % (ref 0.0–0.2)

## 2021-03-01 LAB — CMP (CANCER CENTER ONLY)
ALT: 18 U/L (ref 0–44)
AST: 21 U/L (ref 15–41)
Albumin: 4.6 g/dL (ref 3.5–5.0)
Alkaline Phosphatase: 65 U/L (ref 38–126)
Anion gap: 6 (ref 5–15)
BUN: 14 mg/dL (ref 8–23)
CO2: 32 mmol/L (ref 22–32)
Calcium: 8.7 mg/dL — ABNORMAL LOW (ref 8.9–10.3)
Chloride: 105 mmol/L (ref 98–111)
Creatinine: 0.74 mg/dL (ref 0.61–1.24)
GFR, Estimated: >60 mL/min (ref >60)
Glucose, Bld: 114 mg/dL — ABNORMAL HIGH (ref 70–99)
Potassium: 3.6 mmol/L (ref 3.5–5.1)
Sodium: 143 mmol/L (ref 135–145)
Total Bilirubin: 0.8 mg/dL (ref 0.3–1.2)
Total Protein: 6.8 g/dL (ref 6.5–8.1)

## 2021-03-01 MED ORDER — SODIUM CHLORIDE (PF) 0.9 % IJ SOLN
INTRAMUSCULAR | Status: AC
  Filled 2021-03-01: qty 50

## 2021-03-01 MED ORDER — IOHEXOL 300 MG/ML  SOLN
75.0000 mL | Freq: Once | INTRAMUSCULAR | Status: AC | PRN
  Administered 2021-03-01: 75 mL via INTRAVENOUS

## 2021-03-06 ENCOUNTER — Inpatient Hospital Stay (HOSPITAL_BASED_OUTPATIENT_CLINIC_OR_DEPARTMENT_OTHER): Payer: Medicare Other | Admitting: Internal Medicine

## 2021-03-06 ENCOUNTER — Other Ambulatory Visit: Payer: Self-pay

## 2021-03-06 VITALS — BP 125/73 | HR 71 | Temp 97.5°F | Resp 17 | Ht 71.0 in | Wt 171.1 lb

## 2021-03-06 DIAGNOSIS — C3412 Malignant neoplasm of upper lobe, left bronchus or lung: Secondary | ICD-10-CM | POA: Diagnosis not present

## 2021-03-06 DIAGNOSIS — G8929 Other chronic pain: Secondary | ICD-10-CM | POA: Diagnosis not present

## 2021-03-06 DIAGNOSIS — I10 Essential (primary) hypertension: Secondary | ICD-10-CM | POA: Diagnosis not present

## 2021-03-06 DIAGNOSIS — C3490 Malignant neoplasm of unspecified part of unspecified bronchus or lung: Secondary | ICD-10-CM

## 2021-03-06 DIAGNOSIS — Z800 Family history of malignant neoplasm of digestive organs: Secondary | ICD-10-CM | POA: Diagnosis not present

## 2021-03-06 DIAGNOSIS — Z902 Acquired absence of lung [part of]: Secondary | ICD-10-CM | POA: Diagnosis not present

## 2021-03-06 DIAGNOSIS — D1800 Hemangioma unspecified site: Secondary | ICD-10-CM | POA: Diagnosis not present

## 2021-03-06 DIAGNOSIS — Z85118 Personal history of other malignant neoplasm of bronchus and lung: Secondary | ICD-10-CM | POA: Diagnosis not present

## 2021-03-06 NOTE — Progress Notes (Signed)
[facility] [phone]   [phone]  OFFICE PROGRESS NOTE  [name], MD [place] [address]  DIAGNOSIS: Stage IA (T1c, N0, M0) non-small cell lung cancer, adenocarcinoma with no actionable mutation and negative PDL 1 expression diagnosed in June 2020.   PRIOR THERAPY: Status post left upper lobectomy with lymph node dissection on June 24, 2018.  CURRENT THERAPY: Observation.  INTERVAL HISTORY: Miguel Wolfe 76 y.o. male returns to the clinic today for follow-up visit accompanied by his wife.  The patient is feeling fine today with no concerning complaints except for intermittent back pain.  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  He denied having any current chest pain, shortness of breath, cough or hemoptysis.  He has no nausea, vomiting, diarrhea or constipation.  He has no headache or visual changes.  He denied having any recent weight loss or night sweats.  He had repeat CT scan of the chest performed recently and he is here for evaluation and discussion of his discuss results.     MEDICAL HISTORY: 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   PONV (postoperative nausea and vomiting)    Pre-diabetes    Pulmonary nodules    Thyroid cancer (HCC)     ALLERGIES:  is allergic to gemfibrozil, other, propranolol, niaspan [niacin er], and simvastatin.  MEDICATIONS:  Current Outpatient  Medications  Medication Sig Dispense Refill   acetaminophen (TYLENOL) 325 MG tablet Take 650 mg by mouth every 6 (six) hours as needed for moderate pain or headache.      amLODipine (NORVASC) 10 MG tablet Take 10 mg by mouth daily.     aspirin EC 81 MG tablet Take 81 mg by mouth daily.     carvedilol (COREG) 6.25 MG tablet Take 1 tablet (6.25 mg total) by mouth 2 (two) times daily with a meal. 180 tablet 3   ezetimibe (ZETIA) 10 MG tablet Take 1 tablet (10 mg total) by mouth daily. 90 tablet 3   famotidine (PEPCID) 20 MG tablet Take 20 mg by mouth daily.     ibuprofen (ADVIL,MOTRIN) 200 MG tablet Take 400 mg by mouth every 8 (eight) hours as needed for headache or moderate pain.      levothyroxine (SYNTHROID) 175 MCG tablet Take 175 mcg by mouth daily.     losartan (COZAAR) 100 MG tablet Take 100 mg by mouth daily.     nitroGLYCERIN (NITROSTAT) 0.4 MG SL tablet Place 1 tablet (0.4 mg total) under the tongue every 5 (five) minutes as needed for chest pain. 25 tablet 4   Omega-3 Fatty Acids (FISH OIL) 1000 MG CAPS Take 2 capsules by mouth in the morning and at bedtime.     pantoprazole (PROTONIX) 40 MG tablet Take 40  mg by mouth every other day.     pravastatin (PRAVACHOL) 80 MG tablet Take 1 tablet (80 mg total) by mouth daily. 30 tablet 3   zolpidem (AMBIEN) 10 MG tablet Take 10 mg by mouth at bedtime as needed for sleep.   0   No current facility-administered medications for this visit.    SURGICAL HISTORY:  Past Surgical History:  Procedure Laterality Date   CARDIAC CATHETERIZATION     COLONOSCOPY WITH ESOPHAGOGASTRODUODENOSCOPY (EGD)  2008   CORONARY STENT INTERVENTION N/A 02/20/2016   Procedure: Coronary Stent Intervention;  Surgeon: Burnell Blanks, MD;  Location: [facility];  Service: Cardiovascular;  Laterality: N/A;   ESOPHAGOGASTRODUODENOSCOPY     LEFT HEART CATH AND CORONARY ANGIOGRAPHY N/A 02/20/2016   Procedure: Left Heart Cath and Coronary Angiography;  Surgeon:  Burnell Blanks, MD;  Location: [facility];  Service: Cardiovascular;  Laterality: N/A;   PILONIDAL CYCT     REFRACTIVE SURGERY     retina tear repair   THYROIDECTOMY N/A 01/17/2018   Procedure: TOTAL THYROIDECTOMY;  Surgeon: Armandina Gemma, MD;  Location: [facility];  Service: General;  Laterality: N/A;   VIDEO ASSISTED THORACOSCOPY (VATS)/WEDGE RESECTION Left 06/24/2018   Procedure: VIDEO ASSISTED THORACOSCOPY (VATS)/LUNG RESECTION;  Surgeon: Grace Isaac, MD;  Location: [facility];  Service: Thoracic;  Laterality: Left;   VIDEO BRONCHOSCOPY WITH ENDOBRONCHIAL NAVIGATION N/A 11/18/2017   Procedure: VIDEO BRONCHOSCOPY WITH ENDOBRONCHIAL NAVIGATION WITH BIOPSIES OF LEFT AND RIGHT UPPER LOBES;  Surgeon: Grace Isaac, MD;  Location: [facility];  Service: Thoracic;  Laterality: N/A;   VIDEO BRONCHOSCOPY WITH ENDOBRONCHIAL NAVIGATION N/A 06/11/2018   Procedure: VIDEO BRONCHOSCOPY WITH ENDOBRONCHIAL NAVIGATION;  Surgeon: Grace Isaac, MD;  Location: [facility];  Service: Thoracic;  Laterality: N/A;    REVIEW OF SYSTEMS:  Constitutional: negative Eyes: negative Ears, nose, mouth, throat, and face: negative Respiratory: negative Cardiovascular: negative Gastrointestinal: negative Genitourinary:negative Integument/breast: negative Hematologic/lymphatic: negative Musculoskeletal:positive for back pain Neurological: negative Behavioral/Psych: negative Endocrine: negative Allergic/Immunologic: negative   PHYSICAL EXAMINATION: General appearance: alert, cooperative, and no distress Head: Normocephalic, without obvious abnormality, atraumatic Neck: no adenopathy, no JVD, supple, symmetrical, trachea midline, and thyroid not enlarged, symmetric, no tenderness/mass/nodules Lymph nodes: Cervical, supraclavicular, and axillary nodes normal. Resp: clear to auscultation bilaterally Back: symmetric, no curvature. ROM normal. No CVA tenderness. Cardio: regular rate and rhythm, S1, S2 normal, no  murmur, click, rub or gallop GI: soft, non-tender; bowel sounds normal; no masses,  no organomegaly Extremities: extremities normal, atraumatic, no cyanosis or edema Neurologic: Alert and oriented X 3, normal strength and tone. Normal symmetric reflexes. Normal coordination and gait  ECOG PERFORMANCE STATUS: 1 - Symptomatic but completely ambulatory  Blood pressure 125/73, pulse 71, temperature (!) 97.5 F (36.4 C), temperature source Tympanic, resp. rate 17, height 5\' 11"  (1.803 m), weight 171 lb 2 oz (77.6 kg), SpO2 99 %.  LABORATORY DATA: Lab Results  Component Value Date   WBC 5.2 03/01/2021   HGB 15.2 03/01/2021   HCT 44.6 03/01/2021   MCV 88.8 03/01/2021   PLT 152 03/01/2021      Chemistry      Component Value Date/Time   NA 143 03/01/2021 0845   NA 143 04/24/2016 0935   K 3.6 03/01/2021 0845   CL 105 03/01/2021 0845   CO2 32 03/01/2021 0845   BUN 14 03/01/2021 0845   BUN 17 04/24/2016 0935   CREATININE 0.74 03/01/2021 0845      Component Value  Date/Time   CALCIUM 8.7 (L) 03/01/2021 0845   ALKPHOS 65 03/01/2021 0845   AST 21 03/01/2021 0845   ALT 18 03/01/2021 0845   BILITOT 0.8 03/01/2021 0845       RADIOGRAPHIC STUDIES: CT Chest W Contrast  Result Date: 03/01/2021 CLINICAL DATA:  Primary Cancer Type: Lung Imaging Indication: Routine surveillance Interval therapy since last imaging? No Initial Cancer Diagnosis Date: 06/11/2018; Established by: Biopsy-proven Detailed Pathology: Stage IA non-small cell lung cancer. Primary Tumor location: Left upper lobe. Surgeries: Left upper lobectomy 06/24/2018. Total thyroidectomy 01/17/2018. Coronary stent. Chemotherapy: No Immunotherapy? No Radiation therapy? No EXAM: CT CHEST WITH CONTRAST TECHNIQUE: Multidetector CT imaging of the chest was performed during intravenous contrast administration. RADIATION DOSE REDUCTION: This exam was performed according to the departmental dose-optimization program which includes automated  exposure control, adjustment of the mA and/or kV according to patient size and/or use of iterative reconstruction technique. CONTRAST:  6mL OMNIPAQUE IOHEXOL 300 MG/ML  SOLN COMPARISON:  Most recent CT chest 08/22/2020.  09/26/2020 PET-CT. FINDINGS: Cardiovascular: Aortic atherosclerosis. Tortuous thoracic aorta. Normal heart size, without pericardial effusion. Three vessel coronary artery calcification. No central pulmonary embolism, on this non-dedicated study. Mediastinum/Nodes: Thyroidectomy. No supraclavicular adenopathy. No mediastinal or hilar adenopathy. Lungs/Pleura: No pleural fluid.  Left upper lobectomy. Mild centrilobular emphysema. Right apical linear density with surrounding ground-glass, similar including on 33/7. Upper Abdomen: Normal imaged portions of the liver, spleen, pancreas, gallbladder, adrenal glands, kidneys. The proximal stomach appears similarly thick walled, but is underdistended. A right retrocrural oval-shaped low-density lesion of maximally 2.2 cm is similar to on the prior exam, including on 160/2. Musculoskeletal: The L1 lucent lesion measures on the order of 3.4 cm on 163/7 and is similar to 3.2 cm on the prior exam (when remeasured). Similar involvement of the posterior cortex including on sagittal image 97. IMPRESSION: 1. Status post left upper lobectomy, without typical findings of recurrent or metastatic disease. 2. Similar right apical mixed attenuation focus, favored to represent scarring. Given low-level activity on prior PET, low-grade adenocarcinoma could look similar. Recommend attention on follow-up. 3. Similar appearance of the L1 vertebral body lesion, favoring hemangioma over osseous metastasis. 4. Similar right paravertebral/retrocrural lesion, again favoring neurogenic tumor. 5. Coronary artery atherosclerosis. Aortic Atherosclerosis (ICD10-I70.0). 6. Apparent proximal gastric wall thickening, possibly due to underdistention. Correlate with any symptoms of  gastritis. * onc * Electronically Signed   By: Abigail Miyamoto M.D.   On: 03/01/2021 11:41   MYOCARDIAL PERFUSION IMAGING  Result Date: 02/16/2021   The study is normal. The study is low risk.   No ST deviation was noted.   Left ventricular function is normal. Nuclear stress EF: 59 %. The left ventricular ejection fraction is normal (55-65%). End diastolic cavity size is normal.   Prior study available for comparison from 10/02/2017. No signifcant ischemic defects compared to prior study     ASSESSMENT AND PLAN: This is a very pleasant 76 years old white male with stage Ia non-small cell lung cancer, adenocarcinoma status post left upper lobectomy with lymph node dissection under the care of Dr. Servando Snare in June 2020. 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 This was evaluated in the past by a PET scan and the patient had referral to neurology who advised him to continue with observation. He had repeat CT scan of the chest performed recently.  I personally and independently reviewed the scans and discussed  the results with the patient and his wife. His scan showed no concerning findings for disease recurrence of his lung cancer. The patient has several question about some of the incidental finding likely mild emphysema as well as the hemangioma and the suspicious neurogenic tumor reported on the scan. I answered his questions to the best of the available information at this point. I recommended for him to continue on observation with repeat CT scan of the chest in 1 year. He was advised to call immediately if he has any other concerning symptoms in the interval. The patient voices understanding of current disease status and treatment options and is in agreement with the current care plan.  All questions were answered. The patient knows to call the clinic with any problems, questions or concerns. We can certainly  see the patient much sooner if necessary.  The total time spent in the appointment was 30 minutes.  Disclaimer: This note was dictated with voice recognition software. Similar sounding words can inadvertently be transcribed and may not be corrected upon review.

## 2021-03-13 ENCOUNTER — Other Ambulatory Visit: Payer: Medicare Other | Admitting: *Deleted

## 2021-03-13 ENCOUNTER — Other Ambulatory Visit: Payer: Self-pay

## 2021-03-13 DIAGNOSIS — E785 Hyperlipidemia, unspecified: Secondary | ICD-10-CM | POA: Diagnosis not present

## 2021-03-13 LAB — LIPID PANEL
Chol/HDL Ratio: 2.6 ratio (ref 0.0–5.0)
Cholesterol, Total: 131 mg/dL (ref 100–199)
HDL: 50 mg/dL (ref >39)
LDL Chol Calc (NIH): 63 mg/dL (ref 0–99)
Triglycerides: 99 mg/dL (ref 0–149)
VLDL Cholesterol Cal: 18 mg/dL (ref 5–40)

## 2021-03-13 LAB — HEPATIC FUNCTION PANEL
ALT: 18 IU/L (ref 0–44)
AST: 24 IU/L (ref 0–40)
Albumin: 5 g/dL — ABNORMAL HIGH (ref 3.7–4.7)
Alkaline Phosphatase: 77 IU/L (ref 44–121)
Bilirubin Total: 0.9 mg/dL (ref 0.0–1.2)
Bilirubin, Direct: 0.21 mg/dL (ref 0.00–0.40)
Total Protein: 6.8 g/dL (ref 6.0–8.5)

## 2021-03-15 DIAGNOSIS — Z20822 Contact with and (suspected) exposure to covid-19: Secondary | ICD-10-CM | POA: Diagnosis not present

## 2021-03-23 ENCOUNTER — Ambulatory Visit: Payer: Medicare Other

## 2021-03-24 ENCOUNTER — Ambulatory Visit (INDEPENDENT_AMBULATORY_CARE_PROVIDER_SITE_OTHER): Payer: Medicare Other | Admitting: Thoracic Surgery (Cardiothoracic Vascular Surgery)

## 2021-03-24 ENCOUNTER — Other Ambulatory Visit: Payer: Self-pay

## 2021-03-24 ENCOUNTER — Other Ambulatory Visit: Payer: Self-pay | Admitting: Neurosurgery

## 2021-03-24 VITALS — BP 127/75 | HR 76 | Resp 20 | Ht 71.0 in | Wt 172.0 lb

## 2021-03-24 DIAGNOSIS — D1809 Hemangioma of other sites: Secondary | ICD-10-CM

## 2021-03-24 DIAGNOSIS — Z85118 Personal history of other malignant neoplasm of bronchus and lung: Secondary | ICD-10-CM | POA: Diagnosis not present

## 2021-03-24 DIAGNOSIS — Z902 Acquired absence of lung [part of]: Secondary | ICD-10-CM

## 2021-03-24 NOTE — Progress Notes (Signed)
? ?   ?  [address] ?      [place] [address] ?            [phone]       ? ?Amorita ?[facility] Record #[id number] ?Date of Birth: 04-Oct-1945 ? ?Referring: [name], MD ?Primary Care: [name], MD ?Primary Cardiologist:Mark Marlou Porch, MD ? ?Reason for visit:   follow-up ? ?History of Present Illness:     ?Mr. Lastinger is a former patient of Dr. Servando Snare who underwent a left upper lobectomy in June 2020 for stage Ia adenocarcinoma.  He presents today in follow-up. ? ?Physical Exam: ?BP 127/75   Pulse 76   Resp 20   Ht 5\' 11"  (1.803 m)   Wt 172 lb (78 kg)   SpO2 97% Comment: RA  BMI 23.99 kg/m?  ? ?Alert NAD ?Easy work of breathing ?Abdomen, ND ?In no peripheral edema ? ? ?Diagnostic Studies & Laboratory data: ?CT chest ? 1. Status post left upper lobectomy, without typical findings of ?recurrent or metastatic disease. ?2. Similar right apical mixed attenuation focus, favored to ?represent scarring. Given low-level activity on prior PET, low-grade ?adenocarcinoma could look similar. Recommend attention on follow-up. ?3. Similar appearance of the L1 vertebral body lesion, favoring ?hemangioma over osseous metastasis. ?4. Similar right paravertebral/retrocrural lesion, again favoring ?neurogenic tumor. ?5. Coronary artery atherosclerosis. Aortic Atherosclerosis ?(ICD10-I70.0). ?6. Apparent proximal gastric wall thickening, possibly due to ?underdistention. Correlate with any symptoms of gastritis. ? ?Assessment / Plan:   ?76 year old male status post left upper lobectomy, and history of right upper lobe groundglass opacity which is essentially unchanged.  He will follow-up in 1 year with cross-sectional imaging. ? ? ?Lucile Crater Shevelle Smither ?03/24/2021 7:46 PM ? ? ? ? ? ? ?

## 2021-03-27 ENCOUNTER — Other Ambulatory Visit: Payer: Self-pay | Admitting: Neurosurgery

## 2021-03-27 DIAGNOSIS — D1809 Hemangioma of other sites: Secondary | ICD-10-CM

## 2021-04-11 ENCOUNTER — Ambulatory Visit
Admission: RE | Admit: 2021-04-11 | Discharge: 2021-04-11 | Disposition: A | Discharge status: Home / Self Care | Payer: Medicare Other | Source: Ambulatory Visit | Attending: Neurosurgery | Admitting: Neurosurgery

## 2021-04-11 DIAGNOSIS — M48061 Spinal stenosis, lumbar region without neurogenic claudication: Secondary | ICD-10-CM | POA: Diagnosis not present

## 2021-04-11 DIAGNOSIS — M47816 Spondylosis without myelopathy or radiculopathy, lumbar region: Secondary | ICD-10-CM | POA: Diagnosis not present

## 2021-04-11 DIAGNOSIS — Z85118 Personal history of other malignant neoplasm of bronchus and lung: Secondary | ICD-10-CM | POA: Diagnosis not present

## 2021-04-11 DIAGNOSIS — M47817 Spondylosis without myelopathy or radiculopathy, lumbosacral region: Secondary | ICD-10-CM | POA: Diagnosis not present

## 2021-04-11 DIAGNOSIS — D1809 Hemangioma of other sites: Secondary | ICD-10-CM

## 2021-04-11 MED ORDER — GADOBENATE DIMEGLUMINE 529 MG/ML IV SOLN
16.0000 mL | Freq: Once | INTRAVENOUS | Status: AC | PRN
  Administered 2021-04-11: 16 mL via INTRAVENOUS

## 2021-04-13 ENCOUNTER — Other Ambulatory Visit: Payer: Self-pay | Admitting: Radiation Therapy

## 2021-04-13 DIAGNOSIS — I10 Essential (primary) hypertension: Secondary | ICD-10-CM | POA: Diagnosis not present

## 2021-04-13 DIAGNOSIS — M47816 Spondylosis without myelopathy or radiculopathy, lumbar region: Secondary | ICD-10-CM | POA: Diagnosis not present

## 2021-04-13 DIAGNOSIS — E890 Postprocedural hypothyroidism: Secondary | ICD-10-CM | POA: Diagnosis not present

## 2021-04-13 DIAGNOSIS — D1809 Hemangioma of other sites: Secondary | ICD-10-CM | POA: Diagnosis not present

## 2021-04-17 ENCOUNTER — Inpatient Hospital Stay: Payer: Medicare Other | Attending: Internal Medicine

## 2021-04-17 DIAGNOSIS — Z20822 Contact with and (suspected) exposure to covid-19: Secondary | ICD-10-CM | POA: Diagnosis not present

## 2021-04-24 DIAGNOSIS — D1809 Hemangioma of other sites: Secondary | ICD-10-CM | POA: Diagnosis not present

## 2021-04-24 DIAGNOSIS — M47816 Spondylosis without myelopathy or radiculopathy, lumbar region: Secondary | ICD-10-CM | POA: Diagnosis not present

## 2021-04-27 ENCOUNTER — Other Ambulatory Visit: Payer: Self-pay | Admitting: Neurosurgery

## 2021-04-28 ENCOUNTER — Other Ambulatory Visit: Payer: Self-pay | Admitting: Neurosurgery

## 2021-04-28 DIAGNOSIS — D1809 Hemangioma of other sites: Secondary | ICD-10-CM

## 2021-05-01 ENCOUNTER — Other Ambulatory Visit: Payer: Self-pay | Admitting: Radiation Therapy

## 2021-05-02 ENCOUNTER — Other Ambulatory Visit (HOSPITAL_COMMUNITY)
Admission: RE | Admit: 2021-05-02 | Discharge: 2021-05-02 | Disposition: A | Discharge status: Home / Self Care | Payer: Medicare Other | Source: Ambulatory Visit | Attending: Neurosurgery | Admitting: Neurosurgery

## 2021-05-02 ENCOUNTER — Ambulatory Visit
Admission: RE | Admit: 2021-05-02 | Discharge: 2021-05-02 | Disposition: A | Discharge status: Home / Self Care | Payer: Medicare Other | Source: Ambulatory Visit | Attending: Neurosurgery | Admitting: Neurosurgery

## 2021-05-02 DIAGNOSIS — D1809 Hemangioma of other sites: Secondary | ICD-10-CM | POA: Diagnosis not present

## 2021-05-02 DIAGNOSIS — Z85110 Personal history of malignant carcinoid tumor of bronchus and lung: Secondary | ICD-10-CM | POA: Diagnosis not present

## 2021-05-02 DIAGNOSIS — M898X8 Other specified disorders of bone, other site: Secondary | ICD-10-CM | POA: Diagnosis not present

## 2021-05-02 DIAGNOSIS — M899 Disorder of bone, unspecified: Secondary | ICD-10-CM | POA: Diagnosis not present

## 2021-05-02 MED ORDER — SODIUM CHLORIDE 0.9 % IV SOLN: INTRAVENOUS | Status: DC

## 2021-05-02 MED ORDER — FENTANYL CITRATE PF 50 MCG/ML IJ SOSY
25.0000 ug | PREFILLED_SYRINGE | INTRAMUSCULAR | Status: DC | PRN
  Administered 2021-05-02 (×4): 25 ug via INTRAVENOUS

## 2021-05-02 MED ORDER — KETOROLAC TROMETHAMINE 30 MG/ML IJ SOLN: 30.0000 mg | Freq: Once | INTRAMUSCULAR | Status: DC

## 2021-05-02 MED ORDER — MIDAZOLAM HCL 2 MG/2ML IJ SOLN
1.0000 mg | INTRAMUSCULAR | Status: DC | PRN
  Administered 2021-05-02: 1 mg via INTRAVENOUS

## 2021-05-02 NOTE — Discharge Instructions (Signed)
Bone Aspiration After Care ?This sheet gives you information about how to care for yourself after your procedure. Your health care provider may also give you more specific instructions. If you have problems or questions, contact your health care provider. ?What can I expect after the procedure? ?After the procedure, it is common to have: ?Mild pain and tenderness. ?Swelling. ?Bruising. ?Follow these instructions at home: ?Puncture site care ? ?Follow instructions from your health care provider about how to take care of the puncture site. Make sure you: ?Wash your hands with soap and water before and after you change your bandage (dressing). If soap and water are not available, use hand sanitizer. ?Change your dressing as told by your health care provider. ?Check your puncture site every day for signs of infection. Check for: ?More redness, swelling, or pain. ?Fluid or blood. ?Warmth. ?Pus or a bad smell. ?Activity ?Return to your normal activities as told by your health care provider. Ask your health care provider what activities are safe for you. ?Do not lift anything that is heavier than 10 lb (4.5 kg), or the limit that you are told, until your health care provider says that it is safe. ?Do not drive for 24 hours if you were given a sedative during your procedure. ?General instructions ? ?Take over-the-counter and prescription medicines only as told by your health care provider. ?Do not take baths, swim, or use a hot tub until your health care provider approves. Ask your health care provider if you may take showers. You may only be allowed to take sponge baths. ?If directed, put ice on the affected area. To do this: ?Put ice in a plastic bag. ?Place a towel between your skin and the bag. ?Leave the ice on for 20 minutes, 2-3 times a day. ?Keep all follow-up visits as told by your health care provider. This is important. ? ?Please call [phone] If you experience any of the following: ?Your pain is not  controlled with medicine. ?You have a fever. ?You have more redness, swelling, or pain around the puncture site. ?You have fluid or blood coming from the puncture site. ?Your puncture site feels warm to the touch. ?You have pus or a bad smell coming from the puncture site. ? ?Thank you for visiting [facility] ?Document Revised: 05/13/2018 Document Reviewed: 05/13/2018 ?Elsevier Patient Education ? [place].  ?

## 2021-05-02 NOTE — Progress Notes (Signed)
Pt back in nursing recovery area post procedure. Pt alert and oriented, talking in complete sentences. No complaints at this time. Pt requested crackers and soda for snack. Provided for pt. Pt reports pain 0/10. Pt will stay in nursing area until d/ced by radiologist. ?

## 2021-05-08 LAB — SURGICAL PATHOLOGY

## 2021-05-09 DIAGNOSIS — Z20822 Contact with and (suspected) exposure to covid-19: Secondary | ICD-10-CM | POA: Diagnosis not present

## 2021-05-10 DIAGNOSIS — D1809 Hemangioma of other sites: Secondary | ICD-10-CM | POA: Diagnosis not present

## 2021-05-11 ENCOUNTER — Telehealth: Payer: Self-pay | Admitting: Student-PharmD

## 2021-05-11 NOTE — Telephone Encounter (Signed)
Patient left message stating that he has arthritis in his back and his doctor recommended he take ibuprofen to help with the inflammation, but the patient remembered Dr. Marlou Porch advising him to avoid ibuprofen. He was wondering what he can take to help his back pain. He had a short course of steroids but still having pain so his doctor wanted him to try ibuprofen next. He has CAD with RCA stent in 2018 and his stress test 02/2021 was low risk, but NSAIDs do carry cardiac risk.  ? ?Called patient back. Advised him to try Tylenol first, and if ineffective, to use the lowest effective dose of an NSAID like ibuprofen or naproxen for the shortest duration possible. Recommended he seek Dr. Kandis Mannan advice if his other doctor thinks he needs to be on NSAIDs long-term.  ? ?

## 2021-05-15 ENCOUNTER — Inpatient Hospital Stay: Payer: Medicare Other | Attending: Internal Medicine

## 2021-05-15 ENCOUNTER — Encounter: Payer: Self-pay | Admitting: Physical Therapy

## 2021-05-15 ENCOUNTER — Ambulatory Visit: Payer: Medicare Other | Attending: Neurosurgery | Admitting: Physical Therapy

## 2021-05-15 DIAGNOSIS — M5459 Other low back pain: Secondary | ICD-10-CM | POA: Insufficient documentation

## 2021-05-15 NOTE — Therapy (Addendum)
?OUTPATIENT PHYSICAL THERAPY THORACOLUMBAR EVALUATION ? ? ?Patient Name: Miguel Wolfe ?MRN: [id number] ?DOB:Jun 26, 1945, 76 y.o., male ?Today's Date: 05/15/2021 ? ? PT End of Session - 05/15/21 1138   ? ? Visit Number 1   ? Date for PT Re-Evaluation 08/15/21   ? Authorization Type Medicare   ? PT Start Time 1005   ? PT Stop Time 1050   ? PT Time Calculation (min) 45 min   ? Activity Tolerance Patient tolerated treatment well   ? Behavior During Therapy [facility] for tasks assessed/performed   ? ?  ?  ? ?  ? ? ?Past Medical History:  ?Diagnosis Date  ? Aortic insufficiency   ? Arthritis   ? CAD (coronary artery disease)   ? Colon polyps   ? Family history of adverse reaction to anesthesia   ? Mom vomits and Dad went "nuts"  ? Family history of colon cancer   ? Family history of stomach cancer   ? Fatigue   ? GERD (gastroesophageal reflux disease)   ? Hemorrhoids   ? History of echocardiogram   ? Echo 02/08/16: Mild LVH, EF 55-60, no RWMA, Gr 1 DD, trivial AI, mild LAE  ? Hyperlipidemia   ? Hypertension   ? 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 PONV (postoperative nausea and vomiting)   ? Pre-diabetes   ? Pulmonary nodules   ? Thyroid cancer ([place])   ? ?Past Surgical History:  ?Procedure Laterality Date  ? CARDIAC CATHETERIZATION    ? COLONOSCOPY WITH ESOPHAGOGASTRODUODENOSCOPY (EGD)  2008  ? CORONARY STENT INTERVENTION N/A 02/20/2016  ? Procedure: Coronary Stent Intervention;  Surgeon: Burnell Blanks, MD;  Location: [place] [facility];  Service: Cardiovascular;  Laterality: N/A;  ? ESOPHAGOGASTRODUODENOSCOPY    ? LEFT HEART CATH AND CORONARY ANGIOGRAPHY N/A 02/20/2016  ? Procedure: Left Heart Cath and Coronary Angiography;  Surgeon: Burnell Blanks, MD;  Location: [place] [facility];  Service: Cardiovascular;  Laterality: N/A;  ? PILONIDAL CYCT    ? REFRACTIVE SURGERY    ? retina tear repair  ? THYROIDECTOMY N/A 01/17/2018  ? Procedure: TOTAL THYROIDECTOMY;  Surgeon: Armandina Gemma, MD;  Location: [facility];  Service: General;  Laterality:  N/A;  ? VIDEO ASSISTED THORACOSCOPY (VATS)/WEDGE RESECTION Left 06/24/2018  ? Procedure: VIDEO ASSISTED THORACOSCOPY (VATS)/LUNG RESECTION;  Surgeon: Grace Isaac, MD;  Location: [place];  Service: Thoracic;  Laterality: Left;  ? VIDEO BRONCHOSCOPY WITH ENDOBRONCHIAL NAVIGATION N/A 11/18/2017  ? Procedure: VIDEO BRONCHOSCOPY WITH ENDOBRONCHIAL NAVIGATION WITH BIOPSIES OF LEFT AND RIGHT UPPER LOBES;  Surgeon: Grace Isaac, MD;  Location: [place];  Service: Thoracic;  Laterality: N/A;  ? VIDEO BRONCHOSCOPY WITH ENDOBRONCHIAL NAVIGATION N/A 06/11/2018  ? Procedure: VIDEO BRONCHOSCOPY WITH ENDOBRONCHIAL NAVIGATION;  Surgeon: Grace Isaac, MD;  Location: [place];  Service: Thoracic;  Laterality: N/A;  ? ?Patient Active Problem List  ? Diagnosis Date Noted  ? 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 Genetic testing 01/15/2019  ? 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 Family history of colon cancer   ? Family history of stomach cancer   ? Lung cancer, 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[place]) 06/25/2018  ? Lung nodule 06/24/2018  ? Multiple thyroid nodules 01/15/2018  ? 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 Coronary artery disease involving native coronary artery of native heart without angina pectoris 04/23/2016  ? Hyperlipidemia   ? GERD (gastroesophageal reflux disease)   ? Essential hypertension 02/16/2016  ? ? ?  PCP: Moreen Fowler  ? ?REFERRING PROVIDER: Kathyrn Sheriff ? ?REFERRING DIAG: Spondylosis ? ?THERAPY DIAG:  ?Other low back pain ? ?ONSET DATE: April 2023 ? ? ?SUBJECTIVE:                                                                                                                                                                                          ? ?SUBJECTIVE STATEMENT: ?Patient presents with LBP with MRI below.  Patient repots that March 2nd playing golf felt back pain, reports that the next day bent over to pick up hose and had excruciating LBP.  Took a course of prednisone has  really helped.  Denies radicular symptoms, reports afraid to do anything ? ?PERTINENT HISTORY:  ?IMPRESSION: ?1. Continued interval enlargement of an enhancing bone lesion within ?the L1 vertebral body now measuring 3.6 x 3.4 cm (previously ?measured 2.8 x 2.7 cm on 09/06/2020). There is bulging of the ?posterior wall of the vertebral body with presumed extension into ?the ventral epidural space. Both aggressive hemangioma and ?metastatic disease remain in the differential. ?2. Stable elongated enhancing right paraspinal soft tissue mass ?adjacent to the L1 vertebral body measuring 5.1 x 1.9 x 2.3 cm. 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 ?3. Stable mild degenerative lumbar spondylosis, most pronounced at ?the L5-S1 level where there is mild bilateral foraminal stenosis. ? ?PAIN:  ?Are you having pain? Yes: NPRS scale: 3/10 ?Pain location: low back ?Pain description: ache ?Aggravating factors: bending pain up to 8/10 ?Relieving factors: prednisone has helped, being very guarded and protective at best a 2/10 ? ? ?PRECAUTIONS:   Has had lung cancer ? ?WEIGHT BEARING RESTRICTIONS No ? ?FALLS:  ?Has patient fallen in last 6 months? No ? ?LIVING ENVIRONMENT: ?Lives with: lives with their family ?Lives in: House/apartment ?Stairs: Yes: Internal:   steps; can reach both ?Has following equipment at home: None ? ?OCCUPATION: retired ? ?PLOF: Independent ? ?PATIENT GOALS feel stronger ? ? ?OBJECTIVE:  ? ?DIAGNOSTIC FINDINGS:  ?IMPRESSION: ?1. Continued interval enlargement of an enhancing bone lesion within ?the L1 vertebral body now measuring 3.6 x 3.4 cm (previously ?measured 2.8 x 2.7 cm on 09/06/2020). There is bulging of the ?posterior wall of the vertebral body with presumed extension into ?the ventral epidural space. Both aggressive hemangioma and ?metastatic disease remain in the differential. ?2. Stable elongated enhancing right paraspinal soft tissue mass ?adjacent to the L1 vertebral body  measuring 5.1 x 1.9 x 2.3 cm. 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 ?3. Stable mild degenerative lumbar spondylosis, most pronounced at ?the L5-S1 level where there is mild bilateral foraminal stenosis. ? ?PATIENT  SURVEYS:  ?FOTO 53 ? ?SCREENING FOR RED FLAGS: ?Bowel or bladder incontinence: No ?Spinal tumors: No ?Cauda equina syndrome: No ?Compression fracture: No ?Abdominal aneurysm: No ? ?COGNITION: ? Overall cognitive status: Within functional limits for tasks assessed   ?  ?SENSATION: ?WFL ? ?MUSCLE LENGTH: ?Hamstrings: Right 45 deg; Left 45 degwith pain ?Mild tightness piriformis ? ? ? ?POSTURE:  ?Fwd head, rounded shoulders ? ? ?PALPATION: ?Tight and mild tenderness in the lumbar paraspinals ? ?LUMBAR ROM:  ? ?Active  A/PROM  ?05/15/2021  ?Flexion Decreased 50%  ?Extension Decreased 50%  ?Right lateral flexion Decreased 75%  ?Left lateral flexion Decreased 75%  ?Right rotation   ?Left rotation   ? (Blank rows = not tested) ? ?LE ROM:  WFL's ? ?LE MMT: ? ?MMT Right ?05/15/2021 Left ?05/15/2021  ?Hip flexion 5/5 4/5  ?Hip extension    ?Hip abduction    ?Hip adduction    ?Hip internal rotation    ?Hip external rotation    ?Knee flexion 4+/5 4+/5  ?Knee extension 4+/5 4-/5  ?Ankle dorsiflexion 4+/5 4+/5  ?Ankle plantarflexion    ?Ankle inversion    ?Ankle eversion    ? (Blank rows = not tested) ? ?LUMBAR SPECIAL TESTS:  ?Straight leg raise test: Positive ? ? ? ?GAIT: ?Mild limp on the left ? ? ?TODAY'S TREATMENT  ?HEP ? ? ?PATIENT EDUCATION:  ?Education details: HEP ?Person educated: Patient ?Education method: Explanation, Demonstration, and Handouts ?Education comprehension: verbalized understanding and returned demonstration ? ? ?HOME EXERCISE PROGRAM: ?Access Code: [email or web address] ?URL: [email or web address] ?Date: 05/15/2021 ?Prepared by: Lum Babe ? ?Exercises ?- Supine Lower Trunk Rotation  - 2 x daily - 7 x weekly - 1 sets - 10 reps - 10 hold ?- Hooklying Single Knee to Chest   - 2 x daily - 7 x weekly - 1 sets - 10 reps - 10 hold ?- Seated Hamstring Stretch with Chair  - 2 x daily - 7 x weekly - 1 sets - 5 reps - 30 hold ?- Supine Piriformis Stretch with Foot on Ground  - 2 x d

## 2021-05-15 NOTE — Addendum Note (Signed)
Addended by: Sumner Boast on: 05/15/2021 05:30 PM ? ? Modules accepted: Orders ? ?

## 2021-05-16 ENCOUNTER — Ambulatory Visit (HOSPITAL_COMMUNITY): Payer: Medicare Other | Attending: Cardiology

## 2021-05-16 DIAGNOSIS — I351 Nonrheumatic aortic (valve) insufficiency: Secondary | ICD-10-CM | POA: Diagnosis not present

## 2021-05-16 LAB — ECHOCARDIOGRAM COMPLETE
Area-P 1/2: 2.65 cm2
P 1/2 time: 519 msec
S' Lateral: 3.4 cm

## 2021-05-20 ENCOUNTER — Other Ambulatory Visit: Payer: Self-pay | Admitting: Cardiology

## 2021-05-25 ENCOUNTER — Ambulatory Visit: Payer: Medicare Other | Admitting: Physical Therapy

## 2021-05-25 DIAGNOSIS — M5459 Other low back pain: Secondary | ICD-10-CM

## 2021-05-25 NOTE — Therapy (Signed)
OUTPATIENT PHYSICAL THERAPY THORACOLUMBAR EVALUATION   Patient Name: Miguel Wolfe MRN: [id number] DOB:April 20, 1945, 76 y.o., male Today's Date: 05/25/2021   PT End of Session - 05/25/21 0949     Visit Number 2    Date for PT Re-Evaluation 08/15/21    Authorization Type Medicare    PT Start Time 0930    PT Stop Time 1010    PT Time Calculation (min) 40 min              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   PONV (postoperative nausea and vomiting)    Pre-diabetes    Pulmonary nodules    Thyroid cancer ([place])    Past Surgical History:  Procedure Laterality Date   CARDIAC CATHETERIZATION     COLONOSCOPY WITH ESOPHAGOGASTRODUODENOSCOPY (EGD)  2008   CORONARY STENT INTERVENTION N/A 02/20/2016   Procedure: Coronary Stent Intervention;  Surgeon: Burnell Blanks, MD;  Location: [place] [facility];  Service: Cardiovascular;  Laterality: N/A;   ESOPHAGOGASTRODUODENOSCOPY     LEFT HEART CATH AND CORONARY ANGIOGRAPHY N/A 02/20/2016   Procedure: Left Heart Cath and Coronary Angiography;  Surgeon: Burnell Blanks, MD;  Location: [place] [facility];  Service: Cardiovascular;  Laterality: N/A;   PILONIDAL CYCT     REFRACTIVE SURGERY     retina tear repair   THYROIDECTOMY N/A 01/17/2018   Procedure: TOTAL THYROIDECTOMY;  Surgeon: Armandina Gemma, MD;  [facility];  Service: General;  Laterality: N/A;   VIDEO ASSISTED THORACOSCOPY (VATS)/WEDGE RESECTION Left 06/24/2018   Procedure: VIDEO ASSISTED  THORACOSCOPY (VATS)/LUNG RESECTION;  Surgeon: Grace Isaac, MD;  Location: [place];  Service: Thoracic;  Laterality: Left;   VIDEO BRONCHOSCOPY WITH ENDOBRONCHIAL NAVIGATION N/A 11/18/2017   Procedure: VIDEO BRONCHOSCOPY WITH ENDOBRONCHIAL NAVIGATION WITH BIOPSIES OF LEFT AND RIGHT UPPER LOBES;  Surgeon: Grace Isaac, MD;  Location: [place];  Service: Thoracic;  Laterality: N/A;   VIDEO BRONCHOSCOPY WITH ENDOBRONCHIAL NAVIGATION N/A 06/11/2018   Procedure: VIDEO BRONCHOSCOPY WITH ENDOBRONCHIAL NAVIGATION;  Surgeon: Grace Isaac, MD;  Location: [place];  Service: Thoracic;  Laterality: N/A;   Patient Active Problem List   Diagnosis Date Noted   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   Genetic testing 64/33/2951   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   Family history of colon cancer    Family history of stomach cancer    Lung cancer, 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[place]) 06/25/2018   Lung nodule 06/24/2018   Multiple thyroid nodules 01/15/2018   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   Coronary artery disease involving native coronary artery of native heart without angina pectoris 04/23/2016   Hyperlipidemia    GERD (gastroesophageal reflux disease)    Essential hypertension 02/16/2016    PCP: Moreen Fowler   REFERRING PROVIDER: Kathyrn Sheriff  REFERRING DIAG: Spondylosis  THERAPY DIAG:  Other low back  pain  ONSET DATE: April 2023   SUBJECTIVE:                                                                                                                                                                                           SUBJECTIVE STATEMENT: pt states better, tender with bending but with steroids and ex better. Verb doing HEP And all good but HS stretch help   PERTINENT HISTORY:  IMPRESSION: 1. Continued interval enlargement of an enhancing bone lesion within the L1 vertebral body now measuring 3.6 x 3.4 cm (previously measured 2.8 x 2.7 cm on  09/06/2020). There is bulging of the posterior wall of the vertebral body with presumed extension into the ventral epidural space. Both aggressive hemangioma and metastatic disease remain in the differential. 2. Stable elongated enhancing right paraspinal soft tissue mass adjacent to the L1 vertebral body measuring 5.1 x 1.9 x 2.3 cm. 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 3. Stable mild degenerative lumbar spondylosis, most pronounced at the L5-S1 level where there is mild bilateral foraminal stenosis.  PAIN:  Are you having pain? No   PRECAUTIONS:   Has had lung cancer  WEIGHT BEARING RESTRICTIONS No   PATIENT GOALS feel stronger   OBJECTIVE:   Treatment:  Reviewed HEP and answered any questions Nustep L 5 6 min Trunk Flex and Ext black tband 2 sets 10 Lat Pull 2 sets 10 25# Seated Row 2 sets 10 25# Red tband hip 3 way 12 each with UE support PROM/stretch LE and trunk , HS are very tight BIL   DIAGNOSTIC FINDINGS:  IMPRESSION: 1. Continued interval enlargement of an enhancing bone lesion within the L1 vertebral body now measuring 3.6 x 3.4 cm (previously measured 2.8 x 2.7 cm on 09/06/2020). There is bulging of the posterior wall of the vertebral body with presumed extension into the ventral epidural space. Both aggressive hemangioma and metastatic disease remain in the differential. 2. Stable elongated enhancing right paraspinal soft tissue mass adjacent to the L1 vertebral body measuring 5.1 x 1.9 x 2.3 cm. 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 3. Stable mild degenerative lumbar spondylosis, most pronounced at the L5-S1 level where there is mild bilateral foraminal stenosis.  PATIENT EDUCATION:  Education details: HEP Person educated: Patient Education method: Consulting civil engineer, Media planner, and Handouts Education comprehension: verbalized understanding and returned demonstration   HOME EXERCISE PROGRAM: Access Code:  [email or web address] URL: [email or web address] Date: 05/15/2021 Prepared by: Lum Babe  Exercises - Supine Lower Trunk Rotation  - 2 x daily - 7 x weekly - 1 sets - 10 reps - 10 hold - Hooklying  Single Knee to Chest  - 2 x daily - 7 x weekly - 1 sets - 10 reps - 10 hold - Seated Hamstring Stretch with Chair  - 2 x daily - 7 x weekly - 1 sets - 5 reps - 30 hold - Supine Piriformis Stretch with Foot on Ground  - 2 x daily - 7 x weekly - 1 sets - 5 reps - 30 hold  ASSESSMENT:  CLINICAL IMPRESSION: Pt arrives today without pain, mild tenderness but feels HEP and steroids really helping. Started initial ex and reviewed HEP. Postural cuing needed and speed and control of mvmt. Will assess response to todays session and increase as tolerated next session and add tband ex to HEP   OBJECTIVE IMPAIRMENTS decreased ROM, decreased strength, increased muscle spasms, impaired flexibility, improper body mechanics, and pain.   ACTIVITY LIMITATIONS cleaning, yard work, and yard work.    REHAB POTENTIAL: Excellent  CLINICAL DECISION MAKING: Stable/uncomplicated  EVALUATION COMPLEXITY: Low   GOALS: Goals reviewed with patient? Yes  SHORT TERM GOALS: Target date: 06/08/2021  Independent with initial HEP Baseline: Goal status: INITIAL   LONG TERM GOALS: Target date: 08/17/2021  Understand posture and body mechanics Baseline:  Goal status: INITIAL  2.  Decrease pain overall 50% Baseline:  Goal status: INITIAL  3.  Increase SLR to 70 degrees Baseline:  Goal status: INITIAL  4.  Independent with advanced HEP Baseline:  Goal status: INITIAL   PLAN: PT FREQUENCY: 1x/week  PT DURATION: 12 weeks  PLANNED INTERVENTIONS: Therapeutic exercises, Therapeutic activity, Neuromuscular re-education, Balance training, Gait training, Patient/Family education, Joint mobilization, and Dry Needling.  PLAN FOR NEXT SESSION: assess start of gym activities for core strength and LE  flexibility- progress HEP   Kaius Daino,ANGIE, PTA 05/25/2021, 9:50 AM

## 2021-05-31 ENCOUNTER — Ambulatory Visit: Payer: Medicare Other | Admitting: Physical Therapy

## 2021-05-31 ENCOUNTER — Encounter: Payer: Self-pay | Admitting: Physical Therapy

## 2021-05-31 DIAGNOSIS — M5459 Other low back pain: Secondary | ICD-10-CM | POA: Diagnosis not present

## 2021-05-31 NOTE — Therapy (Signed)
OUTPATIENT PHYSICAL THERAPY THORACOLUMBAR EVALUATION   Patient Name: Miguel Wolfe MRN: [id number] DOB:1945-06-22, 76 y.o., male Today's Date: 05/31/2021   PT End of Session - 05/31/21 1055     Visit Number 3    Date for PT Re-Evaluation 08/15/21    PT Start Time 1100    PT Stop Time 1145    PT Time Calculation (min) 45 min    Activity Tolerance Patient tolerated treatment well    Behavior During Therapy WFL for tasks assessed/performed              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   PONV (postoperative nausea and vomiting)    Pre-diabetes    Pulmonary nodules    Thyroid cancer ([place])    Past Surgical History:  Procedure Laterality Date   CARDIAC CATHETERIZATION     COLONOSCOPY WITH ESOPHAGOGASTRODUODENOSCOPY (EGD)  2008   CORONARY STENT INTERVENTION N/A 02/20/2016   Procedure: Coronary Stent Intervention;  Surgeon: Burnell Blanks, MD;  Location: [facility] [place] [facility];  Service: Cardiovascular;  Laterality: N/A;   ESOPHAGOGASTRODUODENOSCOPY     LEFT HEART CATH AND CORONARY ANGIOGRAPHY N/A 02/20/2016   Procedure: Left Heart Cath and Coronary Angiography;  Surgeon: Burnell Blanks, MD;  Location: [facility];  Service: Cardiovascular;  Laterality: N/A;   PILONIDAL CYCT     REFRACTIVE SURGERY     retina tear repair   THYROIDECTOMY N/A 01/17/2018   Procedure: TOTAL THYROIDECTOMY;  Surgeon: Armandina Gemma, MD;  [facility];  Service: General;  Laterality: N/A;   VIDEO ASSISTED  THORACOSCOPY (VATS)/WEDGE RESECTION Left 06/24/2018   Procedure: VIDEO ASSISTED THORACOSCOPY (VATS)/LUNG RESECTION;  Surgeon: Grace Isaac, MD;  Location: [place];  Service: Thoracic;  Laterality: Left;   VIDEO BRONCHOSCOPY WITH ENDOBRONCHIAL NAVIGATION N/A 11/18/2017   Procedure: VIDEO BRONCHOSCOPY WITH ENDOBRONCHIAL NAVIGATION WITH BIOPSIES OF LEFT AND RIGHT UPPER LOBES;  Surgeon: Grace Isaac, MD;  Location: [place];  Service: Thoracic;  Laterality: N/A;   VIDEO BRONCHOSCOPY WITH ENDOBRONCHIAL NAVIGATION N/A 06/11/2018   Procedure: VIDEO BRONCHOSCOPY WITH ENDOBRONCHIAL NAVIGATION;  Surgeon: Grace Isaac, MD;  Location: [place];  Service: Thoracic;  Laterality: N/A;   Patient Active Problem List   Diagnosis Date Noted   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   Genetic testing 17/51/0258   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   Family history of colon cancer    Family history of stomach cancer    Lung cancer, 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[place]) 06/25/2018   Lung nodule 06/24/2018   Multiple thyroid nodules 01/15/2018   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   Coronary artery disease involving native coronary artery of native heart without angina pectoris 04/23/2016   Hyperlipidemia    GERD (gastroesophageal reflux disease)    Essential hypertension 02/16/2016    PCP: Moreen Fowler   REFERRING  PROVIDER: Kathyrn Sheriff  REFERRING DIAG: Spondylosis  THERAPY DIAG:  Other low back pain  ONSET DATE: April 2023   SUBJECTIVE:                                                                                                                                                                                           SUBJECTIVE STATEMENT:  Doing ok, some stiffness in the legs  PERTINENT HISTORY:  IMPRESSION: 1. Continued interval enlargement of an enhancing bone lesion within the L1 vertebral body now measuring 3.6 x 3.4 cm (previously measured 2.8 x 2.7 cm on  09/06/2020). There is bulging of the posterior wall of the vertebral body with presumed extension into the ventral epidural space. Both aggressive hemangioma and metastatic disease remain in the differential. 2. Stable elongated enhancing right paraspinal soft tissue mass adjacent to the L1 vertebral body measuring 5.1 x 1.9 x 2.3 cm. 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 3. Stable mild degenerative lumbar spondylosis, most pronounced at the L5-S1 level where there is mild bilateral foraminal stenosis.  PAIN:  Are you having pain? No   PRECAUTIONS:   Has had lung cancer  WEIGHT BEARING RESTRICTIONS No   PATIENT GOALS feel stronger   OBJECTIVE:   Treatment: 05/31/2021 Nustep L5 x 6 min  Sit to stand w/ OHP yellow ball 2x10  Standing shoulder Ext 5lb Standing rows 10lb 2x10  Shoulder Er red 2x10 Lat Pulls 25lb 2x10 Bridges x10  LE on Pball bridges, K2C, Oblq x10   PROM/stretch LE and trunk , HS are very tight BIL  Treatment: 5/182/023 Reviewed HEP and answered any questions Nustep L 5 6 min Trunk Flex and Ext black tband 2 sets 10 Lat Pull 2 sets 10 25# Seated Row 2 sets 10 25# Red tband hip 3 way 12 each with UE support PROM/stretch LE and trunk , HS are very tight BIL   DIAGNOSTIC FINDINGS:  IMPRESSION: 1. Continued interval enlargement of an enhancing bone lesion within the L1 vertebral body now measuring 3.6 x 3.4 cm (previously measured 2.8 x 2.7 cm on 09/06/2020). There is bulging of the posterior wall of the vertebral body with presumed extension into the ventral epidural space. Both aggressive hemangioma and metastatic disease remain in the differential. 2. Stable elongated enhancing right paraspinal soft tissue mass adjacent to the L1 vertebral body measuring 5.1 x 1.9 x 2.3 cm. 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 3. Stable mild degenerative lumbar spondylosis, most pronounced at the L5-S1 level where there  is mild bilateral foraminal stenosis.  PATIENT EDUCATION:  Education details: HEP Person educated: Patient Education  method: Explanation, Demonstration, and Handouts Education comprehension: verbalized understanding and returned demonstration   HOME EXERCISE PROGRAM: Access Code: [email or web address] URL: [email or web address] Date: 05/15/2021 Prepared by: Lum Babe  Exercises - Supine Lower Trunk Rotation  - 2 x daily - 7 x weekly - 1 sets - 10 reps - 10 hold - Hooklying Single Knee to Chest  - 2 x daily - 7 x weekly - 1 sets - 10 reps - 10 hold - Seated Hamstring Stretch with Chair  - 2 x daily - 7 x weekly - 1 sets - 5 reps - 30 hold - Supine Piriformis Stretch with Foot on Ground  - 2 x daily - 7 x weekly - 1 sets - 5 reps - 30 hold  ASSESSMENT:  CLINICAL IMPRESSION: Pt arrives today without pain. Pt tolerated a continues progression of postural strength and stretching. Pt is very right in bilateral hamstrings. Core weakness present with standing rows and extensions. Some fatigue present with supine interventions. No reports fo increase pain throughout session. Tactile cues to elbows needed with ER.   OBJECTIVE IMPAIRMENTS decreased ROM, decreased strength, increased muscle spasms, impaired flexibility, improper body mechanics, and pain.   ACTIVITY LIMITATIONS cleaning, yard work, and yard work.    REHAB POTENTIAL: Excellent  CLINICAL DECISION MAKING: Stable/uncomplicated  EVALUATION COMPLEXITY: Low   GOALS: Goals reviewed with patient? Yes  SHORT TERM GOALS: Target date: 06/14/2021  Independent with initial HEP Baseline: Goal status: INITIAL   LONG TERM GOALS: Target date: 08/23/2021  Understand posture and body mechanics Baseline:  Goal status: INITIAL  2.  Decrease pain overall 50% Baseline:  Goal status: INITIAL  3.  Increase SLR to 70 degrees Baseline:  Goal status: INITIAL  4.  Independent with advanced HEP Baseline:  Goal status:  INITIAL   PLAN: PT FREQUENCY: 1x/week  PT DURATION: 12 weeks  PLANNED INTERVENTIONS: Therapeutic exercises, Therapeutic activity, Neuromuscular re-education, Balance training, Gait training, Patient/Family education, Joint mobilization, and Dry Needling.  PLAN FOR NEXT SESSION: assess start of gym activities for core strength and LE flexibility- progress HEP   Scot Jun, PTA 05/31/2021, 10:56 AM

## 2021-06-08 ENCOUNTER — Ambulatory Visit: Payer: Medicare Other | Attending: Neurosurgery | Admitting: Physical Therapy

## 2021-06-08 DIAGNOSIS — M5459 Other low back pain: Secondary | ICD-10-CM | POA: Insufficient documentation

## 2021-06-08 NOTE — Therapy (Signed)
OUTPATIENT PHYSICAL THERAPY THORACOLUMBAR EVALUATION   Patient Name: Miguel Wolfe MRN: [id number] DOB:09-20-1945, 76 y.o., male Today's Date: 06/08/2021   PT End of Session - 06/08/21 0845     Visit Number 4    Date for PT Re-Evaluation 08/15/21    Authorization Type Medicare    PT Start Time 0845    PT Stop Time 0930    PT Time Calculation (min) 45 min              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   PONV (postoperative nausea and vomiting)    Pre-diabetes    Pulmonary nodules    Thyroid cancer ([place])    Past Surgical History:  Procedure Laterality Date   CARDIAC CATHETERIZATION     COLONOSCOPY WITH ESOPHAGOGASTRODUODENOSCOPY (EGD)  2008   CORONARY STENT INTERVENTION N/A 02/20/2016   Procedure: Coronary Stent Intervention;  Surgeon: Burnell Blanks, MD;  Location: [facility];  Service: Cardiovascular;  Laterality: N/A;   ESOPHAGOGASTRODUODENOSCOPY     LEFT HEART CATH AND CORONARY ANGIOGRAPHY N/A 02/20/2016   Procedure: Left Heart Cath and Coronary Angiography;  Surgeon: Burnell Blanks, MD;  Location: [facility];  Service: Cardiovascular;  Laterality: N/A;   PILONIDAL CYCT     REFRACTIVE SURGERY     retina tear repair   THYROIDECTOMY N/A 01/17/2018   Procedure: TOTAL THYROIDECTOMY;  Surgeon: Armandina Gemma, MD;  [facility]: [facility];  Service: General;  Laterality: N/A;   VIDEO ASSISTED THORACOSCOPY (VATS)/WEDGE RESECTION Left 06/24/2018   Procedure: VIDEO ASSISTED  THORACOSCOPY (VATS)/LUNG RESECTION;  Surgeon: Grace Isaac, MD;  Location: [facility];  Service: Thoracic;  Laterality: Left;   VIDEO BRONCHOSCOPY WITH ENDOBRONCHIAL NAVIGATION N/A 11/18/2017   Procedure: VIDEO BRONCHOSCOPY WITH ENDOBRONCHIAL NAVIGATION WITH BIOPSIES OF LEFT AND RIGHT UPPER LOBES;  Surgeon: Grace Isaac, MD;  Location: [facility];  Service: Thoracic;  Laterality: N/A;   VIDEO BRONCHOSCOPY WITH ENDOBRONCHIAL NAVIGATION N/A 06/11/2018   Procedure: VIDEO BRONCHOSCOPY WITH ENDOBRONCHIAL NAVIGATION;  Surgeon: Grace Isaac, MD;  Location: [facility];  Service: Thoracic;  Laterality: N/A;   Patient Active Problem List   Diagnosis Date Noted   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   Genetic testing 75/30/0511   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   Family history of colon cancer    Family history of stomach cancer    Lung cancer, 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[place]) 06/25/2018   Lung nodule 06/24/2018   Multiple thyroid nodules 01/15/2018   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 Planted a rose so a bit aggravated. Going to try to swing the club soon.  PERTINENT HISTORY:  IMPRESSION: 1. Continued interval enlargement of an enhancing bone lesion within the L1 vertebral body now measuring 3.6 x 3.4 cm (previously measured 2.8 x 2.7 cm on 09/06/2020). There is bulging of  the posterior wall of the vertebral body with presumed extension into the ventral epidural space. Both aggressive hemangioma and metastatic disease remain in the differential. 2. Stable elongated enhancing right paraspinal soft tissue mass adjacent to the L1 vertebral body measuring 5.1 x 1.9 x 2.3 cm. 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 3. Stable mild degenerative lumbar spondylosis, most pronounced at the L5-S1 level where there is mild bilateral foraminal stenosis.  PAIN:  Are you having pain? Yes 2/10   PRECAUTIONS:   Has had lung cancer  WEIGHT BEARING RESTRICTIONS No   PATIENT GOALS feel stronger   OBJECTIVE:   Treatment: 06/08/2021 Nustep L5 x 6 min  Sit to stand w/ OHP yellow ball 2x10  BM educ for pregolf warm up, lifting and ADLS including digging and vacuuming Seated rows 25b 2x10  Lat Pulls 25lb 2x10 Green tband trunk rotation 15 x each then OH diagnol AR press and circles 10 each 15# BIL Increased HEP Pass and Act HS stretching   Treatment: 5/182/023 Reviewed HEP and answered any questions Nustep L 5 6 min Trunk Flex and Ext black tband 2 sets 10 Lat Pull 2 sets 10 25# Seated Row 2 sets 10 25# Red tband hip 3 way 12 each with UE support PROM/stretch LE and trunk , HS are very tight BIL   DIAGNOSTIC FINDINGS:  IMPRESSION: 1. Continued interval enlargement of an enhancing bone lesion within the L1 vertebral body now measuring 3.6 x 3.4 cm (previously measured 2.8 x 2.7 cm on 09/06/2020). There is bulging of the posterior wall of the vertebral body with presumed extension into the ventral epidural space. Both aggressive hemangioma and metastatic disease remain in the differential. 2. Stable elongated enhancing right paraspinal soft tissue mass adjacent to the L1 vertebral body measuring 5.1 x 1.9 x 2.3 cm. 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 3. Stable mild degenerative lumbar spondylosis, most pronounced  at the L5-S1 level where there is mild bilateral foraminal stenosis.  PATIENT EDUCATION:  Education details: HEP Person educated: Patient Education method: Explanation, Demonstration, and Handouts Education comprehension: verbalized understanding and returned demonstration   HOME EXERCISE PROGRAM: Access Code: [email or web address] URL: [email or web address] Date: 05/15/2021 Prepared by: Lum Babe   ASSESSMENT:  CLINICAL IMPRESSION:pt arrived 20-30% better. Some issues after planting  a bush so reviewed BM and lifting. Increased HEP. Goal progression. OBJECTIVE IMPAIRMENTS decreased ROM, decreased strength, increased muscle spasms, impaired flexibility, improper body mechanics, and pain.   ACTIVITY LIMITATIONS cleaning, yard work, and yard work.    REHAB POTENTIAL: Excellent  CLINICAL DECISION MAKING: Stable/uncomplicated  EVALUATION COMPLEXITY: Low   GOALS: Goals reviewed with patient? Yes  SHORT TERM GOALS: Target date: 06/08/2021  Independent with initial HEP Baseline: Goal status: met   LONG TERM GOALS: Target date: 08/31/2021  Understand posture and body mechanics Baseline:  Goal status:  2.  Decrease pain overall 50% Baseline:  Goal status: INITIAL  3.  Increase SLR to 70 degrees Baseline:  Goal status: INITIAL  4.  Independent with advanced HEP Baseline:  Goal status: INITIAL   PLAN: PT FREQUENCY: 1x/week  PT DURATION: 12 weeks  PLANNED INTERVENTIONS: Therapeutic exercises, Therapeutic activity, Neuromuscular re-education, Balance training, Gait training, Patient/Family education, Joint mobilization, and Dry Needling.  PLAN FOR NEXT SESSION:  progress HEP   Abhijay Morriss,ANGIE, PTA 06/08/2021, 9:33 AM

## 2021-06-15 ENCOUNTER — Ambulatory Visit: Payer: Medicare Other | Admitting: Physical Therapy

## 2021-06-15 DIAGNOSIS — M5459 Other low back pain: Secondary | ICD-10-CM | POA: Diagnosis not present

## 2021-06-15 NOTE — Therapy (Signed)
OUTPATIENT PHYSICAL THERAPY THORACOLUMBAR EVALUATION   Patient Name: Miguel Wolfe MRN: [id number] DOB:Feb 18, 1945, 76 y.o., male Today's Date: 06/15/2021   PT End of Session - 06/15/21 0841     Visit Number 5    Date for PT Re-Evaluation 08/15/21    Authorization Type Medicare    PT Start Time 0845    PT Stop Time 0930    PT Time Calculation (min) 45 min              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   PONV (postoperative nausea and vomiting)    Pre-diabetes    Pulmonary nodules    Thyroid cancer ([place])    Past Surgical History:  Procedure Laterality Date   CARDIAC CATHETERIZATION     COLONOSCOPY WITH ESOPHAGOGASTRODUODENOSCOPY (EGD)  2008   CORONARY STENT INTERVENTION N/A 02/20/2016   Procedure: Coronary Stent Intervention;  Surgeon: Burnell Blanks, MD;  Location: [facility];  Service: Cardiovascular;  Laterality: N/A;   ESOPHAGOGASTRODUODENOSCOPY     LEFT HEART CATH AND CORONARY ANGIOGRAPHY N/A 02/20/2016   Procedure: Left Heart Cath and Coronary Angiography;  Surgeon: Burnell Blanks, MD;  Location: [facility];  Service: Cardiovascular;  Laterality: N/A;   PILONIDAL CYCT     REFRACTIVE SURGERY     retina tear repair   THYROIDECTOMY N/A 01/17/2018   Procedure: TOTAL THYROIDECTOMY;  Surgeon: Armandina Gemma, MD;  [facility];  Service: General;  Laterality: N/A;   VIDEO ASSISTED THORACOSCOPY (VATS)/WEDGE RESECTION Left 06/24/2018   Procedure: VIDEO ASSISTED  THORACOSCOPY (VATS)/LUNG RESECTION;  Surgeon: Grace Isaac, MD;  Location: [facility];  Service: Thoracic;  Laterality: Left;   VIDEO BRONCHOSCOPY WITH ENDOBRONCHIAL NAVIGATION N/A 11/18/2017   Procedure: VIDEO BRONCHOSCOPY WITH ENDOBRONCHIAL NAVIGATION WITH BIOPSIES OF LEFT AND RIGHT UPPER LOBES;  Surgeon: Grace Isaac, MD;  Location: [facility];  Service: Thoracic;  Laterality: N/A;   VIDEO BRONCHOSCOPY WITH ENDOBRONCHIAL NAVIGATION N/A 06/11/2018   Procedure: VIDEO BRONCHOSCOPY WITH ENDOBRONCHIAL NAVIGATION;  Surgeon: Grace Isaac, MD;  Location: [facility];  Service: Thoracic;  Laterality: N/A;   Patient Active Problem List   Diagnosis Date Noted   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   Genetic testing 09/38/1829   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  ONSET DATE: April 2023   SUBJECTIVE:                                                                                                                                                                                           SUBJECTIVE STATEMENT:  stiff and tender. Golf was good. I think I over did with stretches.    PERTINENT HISTORY:  IMPRESSION: 1. Continued interval enlargement of an enhancing bone lesion within the L1 vertebral body now measuring 3.6 x 3.4 cm (previously measured 2.8 x 2.7 cm on 09/06/2020). There is bulging of the posterior wall  of the vertebral body with presumed extension into the ventral epidural space. Both aggressive hemangioma and metastatic disease remain in the differential. 2. Stable elongated enhancing right paraspinal soft tissue mass adjacent to the L1 vertebral body measuring 5.1 x 1.9 x 2.3 cm. 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 3. Stable mild degenerative lumbar spondylosis, most pronounced at the L5-S1 level where there is mild bilateral foraminal stenosis.  PAIN:  Are you having pain? No just stiff and sore   PRECAUTIONS:   Has had lung cancer  WEIGHT BEARING RESTRICTIONS No   PATIENT GOALS feel stronger   OBJECTIVE:   Treatment 06/15/21 Nustep L 5 6 min Shld ext with pulleys 10# 2 sets 10 Row with cable pulleys 15# 2 sets 10 AR press and circles 10x each 15# pulleys Trunk ext 20 x black tband Lat pull 25# 2 sets 10 Bridge with ball squeeze 15 x Bridge with clam green tband 15x Hip flex with green tband 20 x PROM and stretch hip flex,quad,HS,piriformis and IT  Treatment: 06/08/2021 Nustep L5 x 6 min  Sit to stand w/ OHP yellow ball 2x10  BM educ for pregolf warm up, lifting and ADLS including digging and vacuuming Seated rows 25b 2x10  Lat Pulls 25lb 2x10 Green tband trunk rotation 15 x each then OH diagnol AR press and circles 10 each 15# BIL Increased HEP Pass and Act HS stretching   Treatment: 5/182/023 Reviewed HEP and answered any questions Nustep L 5 6 min Trunk Flex and Ext black tband 2 sets 10 Lat Pull 2 sets 10 25# Seated Row 2 sets 10 25# Red tband hip 3 way 12 each with UE support PROM/stretch LE and trunk , HS are very tight BIL   DIAGNOSTIC FINDINGS:  IMPRESSION: 1. Continued interval enlargement of an enhancing bone lesion within the L1 vertebral body now measuring 3.6 x 3.4 cm (previously measured 2.8 x 2.7 cm on 09/06/2020). There is bulging of the posterior wall of the vertebral body with presumed extension into  the ventral  epidural space. Both aggressive hemangioma and metastatic disease remain in the differential. 2. Stable elongated enhancing right paraspinal soft tissue mass adjacent to the L1 vertebral body measuring 5.1 x 1.9 x 2.3 cm. 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 3. Stable mild degenerative lumbar spondylosis, most pronounced at the L5-S1 level where there is mild bilateral foraminal stenosis.  PATIENT EDUCATION:  Education details: HEP Person educated: Patient Education method: Consulting civil engineer, Media planner, and Handouts Education comprehension: verbalized understanding and returned demonstration   HOME EXERCISE PROGRAM: Access Code: [email or web address] URL: [email or web address] Date: 05/15/2021 Prepared by: Lum Babe   ASSESSMENT:  CLINICAL IMPRESSION:  reviewed and answered HEP questions. Progressed strengthening with cuing. PROM- ROM improving but still tight.  OBJECTIVE IMPAIRMENTS decreased ROM, decreased strength, increased muscle spasms, impaired flexibility, improper body mechanics, and pain.   ACTIVITY LIMITATIONS cleaning, yard work, and yard work.    REHAB POTENTIAL: Excellent  CLINICAL DECISION MAKING: Stable/uncomplicated  EVALUATION COMPLEXITY: Low   GOALS: Goals reviewed with patient? Yes  SHORT TERM GOALS: Target date: 06/08/2021  Independent with initial HEP Baseline: Goal status: met   LONG TERM GOALS: Target date: 09/07/2021  Understand posture and body mechanics Baseline:  Goal status: met  2.  Decrease pain overall 50% Baseline:  Goal status: 20-30%  3.  Increase SLR to 70 degrees Baseline:  Goal status: on going  4.  Independent with advanced HEP Baseline:  Goal status: partially met   PLAN: PT FREQUENCY: 1x/week  PT DURATION: 12 weeks  PLANNED INTERVENTIONS: Therapeutic exercises, Therapeutic activity, Neuromuscular re-education, Balance training, Gait training, Patient/Family education, Joint  mobilization, and Dry Needling.  PLAN FOR NEXT SESSION: assess and progress   Kariann Wecker,ANGIE, PTA 06/15/2021, 9:25 AM

## 2021-06-21 ENCOUNTER — Encounter: Payer: Self-pay | Admitting: Physical Therapy

## 2021-06-21 ENCOUNTER — Ambulatory Visit: Payer: Medicare Other | Admitting: Physical Therapy

## 2021-06-21 DIAGNOSIS — M5459 Other low back pain: Secondary | ICD-10-CM | POA: Diagnosis not present

## 2021-06-21 NOTE — Therapy (Signed)
OUTPATIENT PHYSICAL THERAPY THORACOLUMBAR EVALUATION   Patient Name: Miguel Wolfe MRN: [id number] DOB:1945/08/20, 76 y.o., male Today's Date: 06/21/2021   PT End of Session - 06/21/21 0847     Visit Number 6    Date for PT Re-Evaluation 08/15/21    Authorization Type Medicare    PT Start Time 0836    PT Stop Time 0925    PT Time Calculation (min) 49 min    Activity Tolerance Patient tolerated treatment well    Behavior During Therapy [facility] for tasks assessed/performed              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   PONV (postoperative nausea and vomiting)    Pre-diabetes    Pulmonary nodules    Thyroid cancer ([place])    Past Surgical History:  Procedure Laterality Date   CARDIAC CATHETERIZATION     COLONOSCOPY WITH ESOPHAGOGASTRODUODENOSCOPY (EGD)  2008   CORONARY STENT INTERVENTION N/A 02/20/2016   Procedure: Coronary Stent Intervention;  Surgeon: Burnell Blanks, MD;  Location: [facility];  Service: Cardiovascular;  Laterality: N/A;   ESOPHAGOGASTRODUODENOSCOPY     LEFT HEART CATH AND CORONARY ANGIOGRAPHY N/A 02/20/2016   Procedure: Left Heart Cath and Coronary Angiography;  Surgeon: Burnell Blanks, MD;  Location: [facility];  Service: Cardiovascular;  Laterality: N/A;   PILONIDAL CYCT     REFRACTIVE SURGERY     retina tear repair   THYROIDECTOMY N/A 01/17/2018   Procedure: TOTAL THYROIDECTOMY;  Surgeon: Armandina Gemma, MD;  [facility];  Service: General;   Laterality: N/A;   VIDEO ASSISTED THORACOSCOPY (VATS)/WEDGE RESECTION Left 06/24/2018   Procedure: VIDEO ASSISTED THORACOSCOPY (VATS)/LUNG RESECTION;  Surgeon: Grace Isaac, MD;  Location: [facility];  Service: Thoracic;  Laterality: Left;   VIDEO BRONCHOSCOPY WITH ENDOBRONCHIAL NAVIGATION N/A 11/18/2017   Procedure: VIDEO BRONCHOSCOPY WITH ENDOBRONCHIAL NAVIGATION WITH BIOPSIES OF LEFT AND RIGHT UPPER LOBES;  Surgeon: Grace Isaac, MD;  Location: [facility];  Service: Thoracic;  Laterality: N/A;   VIDEO BRONCHOSCOPY WITH ENDOBRONCHIAL NAVIGATION N/A 06/11/2018   Procedure: VIDEO BRONCHOSCOPY WITH ENDOBRONCHIAL NAVIGATION;  Surgeon: Grace Isaac, MD;  Location: [facility];  Service: Thoracic;  Laterality: N/A;   Patient Active Problem List   Diagnosis Date Noted   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   Genetic testing 16/60/6301   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   Family history of colon cancer    Family history of stomach cancer    Lung cancer, 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[place]) 06/25/2018   Lung nodule 06/24/2018   Multiple thyroid nodules 01/15/2018   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   Coronary artery disease involving native coronary artery of native heart without angina pectoris 04/23/2016   Hyperlipidemia    GERD (gastroesophageal reflux disease)    Essential hypertension 02/16/2016  PCP: Gretchen Short PROVIDER: Kathyrn Sheriff  REFERRING DIAG: Spondylosis  THERAPY DIAG:  Other low back pain  ONSET DATE: April 2023   SUBJECTIVE:                                                                                                                                                                                           SUBJECTIVE STATEMENT:   Doing okay, A little stiff and sore, I will try to play golf tomorrow  PERTINENT HISTORY:  IMPRESSION: 1. Continued interval enlargement of an enhancing bone lesion within the L1 vertebral body now  measuring 3.6 x 3.4 cm (previously measured 2.8 x 2.7 cm on 09/06/2020). There is bulging of the posterior wall of the vertebral body with presumed extension into the ventral epidural space. Both aggressive hemangioma and metastatic disease remain in the differential. 2. Stable elongated enhancing right paraspinal soft tissue mass adjacent to the L1 vertebral body measuring 5.1 x 1.9 x 2.3 cm. 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 3. Stable mild degenerative lumbar spondylosis, most pronounced at the L5-S1 level where there is mild bilateral foraminal stenosis.  PAIN:  Are you having pain? No just stiff and sore, I did hurt a little after stretching the other day   PRECAUTIONS:   Has had lung cancer  WEIGHT BEARING RESTRICTIONS No   PATIENT GOALS feel stronger   OBJECTIVE:   Treatment 06/21/21: Walk 2 laps Seated Row 25# 2x10. Lats 25# 2x10 5# 2x10 straight arm pulls for core activation 10# AR press showed him how to do this at home with tband 40# leg press 2x10 Supine feet on ball K2C, trunk rotation, small bridges and isometric abs Passive stretch LE's   Treatment 06/15/21 Nustep L 5 6 min Shld ext with pulleys 10# 2 sets 10 Row with cable pulleys 15# 2 sets 10 AR press and circles 10x each 15# pulleys Trunk ext 20 x black tband Lat pull 25# 2 sets 10 Bridge with ball squeeze 15 x Bridge with clam green tband 15x Hip flex with green tband 20 x PROM and stretch hip flex,quad,HS,piriformis and IT  Treatment: 06/08/2021 Nustep L5 x 6 min  Sit to stand w/ OHP yellow ball 2x10  BM educ for pregolf warm up, lifting and ADLS including digging and vacuuming Seated rows 25b 2x10  Lat Pulls 25lb 2x10 Green tband trunk rotation 15 x each then OH diagnol AR press and circles 10 each 15# BIL Increased HEP Pass and Act HS stretching   Treatment: 5/182/023 Reviewed HEP and answered any questions Nustep L 5 6 min Trunk Flex and Ext black tband  2 sets  10 Lat Pull 2 sets 10 25# Seated Row 2 sets 10 25# Red tband hip 3 way 12 each with UE support PROM/stretch LE and trunk , HS are very tight BIL   DIAGNOSTIC FINDINGS:  IMPRESSION: 1. Continued interval enlargement of an enhancing bone lesion within the L1 vertebral body now measuring 3.6 x 3.4 cm (previously measured 2.8 x 2.7 cm on 09/06/2020). There is bulging of the posterior wall of the vertebral body with presumed extension into the ventral epidural space. Both aggressive hemangioma and metastatic disease remain in the differential. 2. Stable elongated enhancing right paraspinal soft tissue mass adjacent to the L1 vertebral body measuring 5.1 x 1.9 x 2.3 cm. 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 3. Stable mild degenerative lumbar spondylosis, most pronounced at the L5-S1 level where there is mild bilateral foraminal stenosis.  PATIENT EDUCATION:  Education details: HEP Person educated: Patient Education method: Consulting civil engineer, Media planner, and Handouts Education comprehension: verbalized understanding and returned demonstration   HOME EXERCISE PROGRAM: Access Code: [email or web address] URL: [email or web address] Date: 05/15/2021 Prepared by: Lum Babe   ASSESSMENT:  CLINICAL IMPRESSION:    Patient doing well, was very pleased that he was able to hit some balls, he is planning to play a round of golf this weekend.  I added a few things but decided against others since he has this golf trip planned.  His HS and piriformis are still very tight and painful.  Had some leg fatigue with the leg press  OBJECTIVE IMPAIRMENTS decreased ROM, decreased strength, increased muscle spasms, impaired flexibility, improper body mechanics, and pain.   ACTIVITY LIMITATIONS cleaning, yard work, and yard work.    REHAB POTENTIAL: Excellent  CLINICAL DECISION MAKING: Stable/uncomplicated  EVALUATION COMPLEXITY: Low   GOALS: Goals reviewed with patient?  Yes  SHORT TERM GOALS: Target date: 06/08/2021  Independent with initial HEP Baseline: Goal status: met   LONG TERM GOALS: Target date: 09/13/21  Understand posture and body mechanics Baseline:  Goal status: met  2.  Decrease pain overall 50% Baseline:  Goal status: 20-30%  3.  Increase SLR to 70 degrees Baseline:  Goal status: on going  4.  Independent with advanced HEP Baseline:  Goal status: partially met   PLAN: PT FREQUENCY: 1x/week  PT DURATION: 12 weeks  PLANNED INTERVENTIONS: Therapeutic exercises, Therapeutic activity, Neuromuscular re-education, Balance training, Gait training, Patient/Family education, Joint mobilization, and Dry Needling.  PLAN FOR NEXT SESSION: see how is golf trip went   [place], PT 06/21/2021, 9:22 AM

## 2021-06-27 ENCOUNTER — Ambulatory Visit: Payer: Medicare Other | Admitting: Physical Therapy

## 2021-06-27 DIAGNOSIS — M5459 Other low back pain: Secondary | ICD-10-CM | POA: Diagnosis not present

## 2021-06-27 NOTE — Therapy (Signed)
OUTPATIENT PHYSICAL THERAPY THORACOLUMBAR  Patient Name: Miguel Wolfe MRN: [id number] DOB:1945-10-10, 76 y.o., male Today's Date: 06/27/2021   PT End of Session - 06/27/21 0843     Visit Number 7    Date for PT Re-Evaluation 08/15/21    Authorization Type Medicare    PT Start Time [phone]    PT Stop Time 0930    PT Time Calculation (min) 47 min              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   PONV (postoperative nausea and vomiting)    Pre-diabetes    Pulmonary nodules    Thyroid cancer ([place])    Past Surgical History:  Procedure Laterality Date   CARDIAC CATHETERIZATION     COLONOSCOPY WITH ESOPHAGOGASTRODUODENOSCOPY (EGD)  2008   CORONARY STENT INTERVENTION N/A 02/20/2016   Procedure: Coronary Stent Intervention;  Surgeon: Burnell Blanks, MD;  Location: [place] [facility];  Service: Cardiovascular;  Laterality: N/A;   ESOPHAGOGASTRODUODENOSCOPY     LEFT HEART CATH AND CORONARY ANGIOGRAPHY N/A 02/20/2016   Procedure: Left Heart Cath and Coronary Angiography;  Surgeon: Burnell Blanks, MD;  Location: [place] [facility];  Service: Cardiovascular;  Laterality: N/A;   PILONIDAL CYCT     REFRACTIVE SURGERY     retina tear repair   THYROIDECTOMY N/A 01/17/2018   Procedure: TOTAL THYROIDECTOMY;  Surgeon: Armandina Gemma, MD;  Location: [facility];  Service: General;  Laterality: N/A;   VIDEO ASSISTED THORACOSCOPY (VATS)/WEDGE RESECTION Left 06/24/2018   Procedure: VIDEO ASSISTED THORACOSCOPY (VATS)/LUNG  RESECTION;  Surgeon: Grace Isaac, MD;  Location: [place];  Service: Thoracic;  Laterality: Left;   VIDEO BRONCHOSCOPY WITH ENDOBRONCHIAL NAVIGATION N/A 11/18/2017   Procedure: VIDEO BRONCHOSCOPY WITH ENDOBRONCHIAL NAVIGATION WITH BIOPSIES OF LEFT AND RIGHT UPPER LOBES;  Surgeon: Grace Isaac, MD;  Location: [place];  Service: Thoracic;  Laterality: N/A;   VIDEO BRONCHOSCOPY WITH ENDOBRONCHIAL NAVIGATION N/A 06/11/2018   Procedure: VIDEO BRONCHOSCOPY WITH ENDOBRONCHIAL NAVIGATION;  Surgeon: Grace Isaac, MD;  Location: [place];  Service: Thoracic;  Laterality: N/A;   Patient Active Problem List   Diagnosis Date Noted   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   Genetic testing 96/75/9163   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   Family history of colon cancer    Family history of stomach cancer    Lung cancer, 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[place]) 06/25/2018   Lung nodule 06/24/2018   Multiple thyroid nodules 01/15/2018   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   Coronary artery disease involving native coronary artery of native heart without angina pectoris 04/23/2016   Hyperlipidemia    GERD (gastroesophageal reflux disease)    Essential hypertension 02/16/2016    PCP: Moreen Fowler   REFERRING PROVIDER: Kathyrn Sheriff  REFERRING DIAG: Spondylosis  THERAPY DIAG:  Other low back pain  ONSET DATE: April 2023   SUBJECTIVE:                                                                                                                                                                                           SUBJECTIVE STATEMENT:   Went on a golf trip this past weekend, btwn playing and the drive I am paying for it today  PERTINENT HISTORY:  IMPRESSION: 1. Continued interval enlargement of an enhancing bone lesion within the L1 vertebral body now measuring 3.6 x 3.4 cm (previously measured 2.8 x 2.7 cm on 09/06/2020). There is bulging of the posterior wall of  the vertebral body with presumed extension into the ventral epidural space. Both aggressive hemangioma and metastatic disease remain in the differential. 2. Stable elongated enhancing right paraspinal soft tissue mass adjacent to the L1 vertebral body measuring 5.1 x 1.9 x 2.3 cm. 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 3. Stable mild degenerative lumbar spondylosis, most pronounced at the L5-S1 level where there is mild bilateral foraminal stenosis.  PAIN:  Are you having pain? YES 5/10 LBP   PRECAUTIONS:   Has had lung cancer  WEIGHT BEARING RESTRICTIONS No   PATIENT GOALS feel stronger   OBJECTIVE:   Treatment 06/27/21  Nustep L 5 6 min Leg Press 50# 3 sets 10 the calf raises 2 sets 15 [employer] 8# 1 lap each way Seated Row and Lat Pull 25# 2 sets 15 Black Tband trunk ext 20 x Core Stab Ex Supine 10 min Prone Hip Ext 10 each ( opp arm and leg increased pain) Passive stretching LE and trunk   Treatment 06/21/21: Walk 2 laps Seated Row 25# 2x10. Lats 25# 2x10 5# 2x10 straight arm pulls for core activation 10# AR press showed him how to do this at home with tband 40# leg press 2x10 Supine feet on ball K2C, trunk rotation, small bridges and isometric abs Passive stretch LE's   Treatment 06/15/21 Nustep L 5 6 min Shld ext with pulleys 10# 2 sets 10 Row with cable pulleys 15# 2 sets 10 AR press and circles 10x each 15# pulleys Trunk ext 20 x black tband Lat pull 25# 2 sets 10 Bridge with ball squeeze 15 x Bridge with clam green tband 15x Hip flex with green tband 20 x PROM and stretch hip flex,quad,HS,piriformis and IT  Treatment: 06/08/2021 Nustep L5 x 6 min  Sit to stand w/ OHP yellow ball 2x10  BM educ for pregolf warm up, lifting and ADLS including digging and vacuuming Seated rows 25b 2x10  Lat Pulls 25lb 2x10 Green tband trunk rotation 15 x  each then [facility] diagnol AR press and circles 10 each 15# BIL Increased HEP Pass and Act HS  stretching   Treatment: 5/182/023 Reviewed HEP and answered any questions Nustep L 5 6 min Trunk Flex and Ext black tband 2 sets 10 Lat Pull 2 sets 10 25# Seated Row 2 sets 10 25# Red tband hip 3 way 12 each with UE support PROM/stretch LE and trunk , HS are very tight BIL   DIAGNOSTIC FINDINGS:  IMPRESSION: 1. Continued interval enlargement of an enhancing bone lesion within the L1 vertebral body now measuring 3.6 x 3.4 cm (previously measured 2.8 x 2.7 cm on 09/06/2020). There is bulging of the posterior wall of the vertebral body with presumed extension into the ventral epidural space. Both aggressive hemangioma and metastatic disease remain in the differential. 2. Stable elongated enhancing right paraspinal soft tissue mass adjacent to the L1 vertebral body measuring 5.1 x 1.9 x 2.3 cm. 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 3. Stable mild degenerative lumbar spondylosis, most pronounced at the L5-S1 level where there is mild bilateral foraminal stenosis.  PATIENT EDUCATION:  Education details: HEP Person educated: Patient Education method: Consulting civil engineer, Media planner, and Handouts Education comprehension: verbalized understanding and returned demonstration   HOME EXERCISE PROGRAM: Access Code: [email or web address] URL: [email or web address] Date: 05/15/2021 Prepared by: Lum Babe   ASSESSMENT:  CLINICAL IMPRESSION:    pt arrived with increased pain and stiffness after golfing all weekend and driving. Felt some better after session. No goals met this week as slight set back with golf.  OBJECTIVE IMPAIRMENTS decreased ROM, decreased strength, increased muscle spasms, impaired flexibility, improper body mechanics, and pain.   ACTIVITY LIMITATIONS cleaning, yard work, and yard work.    REHAB POTENTIAL: Excellent  CLINICAL DECISION MAKING: Stable/uncomplicated  EVALUATION COMPLEXITY: Low   GOALS: Goals reviewed with patient?  Yes  SHORT TERM GOALS: Target date: 06/08/2021  Independent with initial HEP Baseline: Goal status: met   LONG TERM GOALS: Target date: 09/13/21  Understand posture and body mechanics Baseline:  Goal status: met  2.  Decrease pain overall 50% Baseline:  Goal status: 20-30%  3.  Increase SLR to 70 degrees Baseline:  Goal status: on going  4.  Independent with advanced HEP Baseline:  Goal status: partially met   PLAN: PT FREQUENCY: 1x/week  PT DURATION: 12 weeks  PLANNED INTERVENTIONS: Therapeutic exercises, Therapeutic activity, Neuromuscular re-education, Balance training, Gait training, Patient/Family education, Joint mobilization, and Dry Needling.  PLAN FOR NEXT SESSION: assess and progress  Zephaniah Lubrano,ANGIE, PTA 06/27/2021, 8:44 AM

## 2021-06-29 DIAGNOSIS — Z85850 Personal history of malignant neoplasm of thyroid: Secondary | ICD-10-CM | POA: Diagnosis not present

## 2021-06-29 DIAGNOSIS — E782 Mixed hyperlipidemia: Secondary | ICD-10-CM | POA: Diagnosis not present

## 2021-06-29 DIAGNOSIS — R7303 Prediabetes: Secondary | ICD-10-CM | POA: Diagnosis not present

## 2021-06-29 DIAGNOSIS — E890 Postprocedural hypothyroidism: Secondary | ICD-10-CM | POA: Diagnosis not present

## 2021-06-29 DIAGNOSIS — M25521 Pain in right elbow: Secondary | ICD-10-CM | POA: Diagnosis not present

## 2021-07-03 DIAGNOSIS — Z85850 Personal history of malignant neoplasm of thyroid: Secondary | ICD-10-CM | POA: Diagnosis not present

## 2021-07-03 DIAGNOSIS — R7303 Prediabetes: Secondary | ICD-10-CM | POA: Diagnosis not present

## 2021-07-03 DIAGNOSIS — I25111 Atherosclerotic heart disease of native coronary artery with angina pectoris with documented spasm: Secondary | ICD-10-CM | POA: Diagnosis not present

## 2021-07-03 DIAGNOSIS — M899 Disorder of bone, unspecified: Secondary | ICD-10-CM | POA: Diagnosis not present

## 2021-07-03 DIAGNOSIS — K219 Gastro-esophageal reflux disease without esophagitis: Secondary | ICD-10-CM | POA: Diagnosis not present

## 2021-07-03 DIAGNOSIS — Z1331 Encounter for screening for depression: Secondary | ICD-10-CM | POA: Diagnosis not present

## 2021-07-03 DIAGNOSIS — E890 Postprocedural hypothyroidism: Secondary | ICD-10-CM | POA: Diagnosis not present

## 2021-07-03 DIAGNOSIS — G4700 Insomnia, unspecified: Secondary | ICD-10-CM | POA: Diagnosis not present

## 2021-07-03 DIAGNOSIS — I10 Essential (primary) hypertension: Secondary | ICD-10-CM | POA: Diagnosis not present

## 2021-07-03 DIAGNOSIS — Z85118 Personal history of other malignant neoplasm of bronchus and lung: Secondary | ICD-10-CM | POA: Diagnosis not present

## 2021-07-03 DIAGNOSIS — Z0000 Encounter for general adult medical examination without abnormal findings: Secondary | ICD-10-CM | POA: Diagnosis not present

## 2021-07-03 DIAGNOSIS — E782 Mixed hyperlipidemia: Secondary | ICD-10-CM | POA: Diagnosis not present

## 2021-07-04 ENCOUNTER — Ambulatory Visit: Payer: Medicare Other | Admitting: Physical Therapy

## 2021-07-04 ENCOUNTER — Encounter: Payer: Self-pay | Admitting: Physical Therapy

## 2021-07-04 DIAGNOSIS — M5459 Other low back pain: Secondary | ICD-10-CM

## 2021-07-13 ENCOUNTER — Encounter: Payer: Self-pay | Admitting: Physical Therapy

## 2021-07-13 ENCOUNTER — Ambulatory Visit: Payer: Medicare Other | Attending: Neurosurgery | Admitting: Physical Therapy

## 2021-07-13 DIAGNOSIS — M5459 Other low back pain: Secondary | ICD-10-CM | POA: Diagnosis not present

## 2021-07-13 NOTE — Therapy (Signed)
OUTPATIENT PHYSICAL THERAPY THORACOLUMBAR  Patient Name: Miguel Wolfe MRN: [id number] DOB:[birth year], 76 y.o., male Today's Date: 07/13/2021   PT End of Session - 07/13/21 0848     Visit Number 9    Date for PT Re-Evaluation 08/15/21    PT Start Time 0848    PT Stop Time 0930    PT Time Calculation (min) 42 min    Activity Tolerance Patient tolerated treatment well    Behavior During Therapy [facility] for tasks assessed/performed              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   PONV (postoperative nausea and vomiting)    Pre-diabetes    Pulmonary nodules    Thyroid cancer ([place])    Past Surgical History:  Procedure Laterality Date   CARDIAC CATHETERIZATION     COLONOSCOPY WITH ESOPHAGOGASTRODUODENOSCOPY (EGD)  2008   CORONARY STENT INTERVENTION N/A 02/20/2016   Procedure: Coronary Stent Intervention;  Surgeon: Burnell Blanks, MD;  Location: [place] [facility];  Service: Cardiovascular;  Laterality: N/A;   ESOPHAGOGASTRODUODENOSCOPY     LEFT HEART CATH AND CORONARY ANGIOGRAPHY N/A 02/20/2016   Procedure: Left Heart Cath and Coronary Angiography;  Surgeon: Burnell Blanks, MD;  Location: [place] [facility];  Service: Cardiovascular;  Laterality: N/A;   PILONIDAL CYCT     REFRACTIVE SURGERY     retina tear repair   THYROIDECTOMY N/A 01/17/2018   Procedure: TOTAL THYROIDECTOMY;  Surgeon: Armandina Gemma, MD;  [facility];  Service: General;  Laterality: N/A;   VIDEO ASSISTED THORACOSCOPY  (VATS)/WEDGE RESECTION Left 06/24/2018   Procedure: VIDEO ASSISTED THORACOSCOPY (VATS)/LUNG RESECTION;  Surgeon: Grace Isaac, MD;  Location: [place];  Service: Thoracic;  Laterality: Left;   VIDEO BRONCHOSCOPY WITH ENDOBRONCHIAL NAVIGATION N/A 11/18/2017   Procedure: VIDEO BRONCHOSCOPY WITH ENDOBRONCHIAL NAVIGATION WITH BIOPSIES OF LEFT AND RIGHT UPPER LOBES;  Surgeon: Grace Isaac, MD;  Location: [place];  Service: Thoracic;  Laterality: N/A;   VIDEO BRONCHOSCOPY WITH ENDOBRONCHIAL NAVIGATION N/A 06/11/2018   Procedure: VIDEO BRONCHOSCOPY WITH ENDOBRONCHIAL NAVIGATION;  Surgeon: Grace Isaac, MD;  Location: [place];  Service: Thoracic;  Laterality: N/A;   Patient Active Problem List   Diagnosis Date Noted   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   Genetic testing 36/64/4034   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   Family history of colon cancer    Family history of stomach cancer    Lung cancer, 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[place]) 06/25/2018   Lung nodule 06/24/2018   Multiple thyroid nodules 01/15/2018   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   Coronary artery disease involving native coronary artery of native heart without angina pectoris 04/23/2016   Hyperlipidemia    GERD (gastroesophageal reflux disease)    Essential hypertension 02/16/2016    PCP: Moreen Fowler   REFERRING PROVIDER: Kathyrn Sheriff  REFERRING DIAG: Spondylosis  THERAPY DIAG:  Other low back pain  ONSET DATE: April 2023   SUBJECTIVE:                                                                                                                                                                                           SUBJECTIVE STATEMENT:   Just feel tight in my lower back. I walked 14,000 steps yesterday.   PERTINENT HISTORY:  IMPRESSION: 1. Continued interval enlargement of an enhancing bone lesion within the L1 vertebral body now measuring 3.6 x 3.4 cm (previously measured 2.8 x  2.7 cm on 09/06/2020). There is bulging of the posterior wall of the vertebral body with presumed extension into the ventral epidural space. Both aggressive hemangioma and metastatic disease remain in the differential. 2. Stable elongated enhancing right paraspinal soft tissue mass adjacent to the L1 vertebral body measuring 5.1 x 1.9 x 2.3 cm. 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 3. Stable mild degenerative lumbar spondylosis, most pronounced at the L5-S1 level where there is mild bilateral foraminal stenosis.  PAIN:  Are you having pain? No   PRECAUTIONS:   Has had lung cancer  WEIGHT BEARING RESTRICTIONS No   PATIENT GOALS feel stronger   OBJECTIVE:  07/13/21 NuStep L5 x 6 min Rows/lat pull down 25# 2x10 Back extension with black TB 1x10 Feet on ball KTC, Obliques, bridges and isometric abdominals,  2x10 each Passive hamstring/piriformis stretch with STM 2x15", R > L   Treatment 07/04/21 Bike level 4 x 6 minutes 2 laps walking good pace 10# 2x10 leg extension Leg curls 25# 2x10 Tried AR press but this caused elbow pain so we stopped 20# farmer carry each arm 2 laps Feet on ball K2C, trunk rotation, bridges and isometric abs Passive stretchLE's   Treatment 06/27/21  Nustep L 5 6 min Leg Press 50# 3 sets 10 the calf raises 2 sets 15 [employer] Car[employer] 8# 1 lap each way Seated Row and Lat Pull 25# 2 sets 15 Black Tband trunk ext 20 x Core Stab Ex Supine 10 min Prone Hip Ext 10 each ( opp arm and leg increased pain) Passive stretching LE and trunk   Treatment 06/21/21: Walk 2 laps Seated Row 25# 2x10. Lats 25# 2x10 5# 2x10 straight arm pulls for core activation 10# AR press showed him how to do this at home with tband 40# leg press 2x10 Supine feet on ball K2C, trunk rotation, small bridges and isometric abs Passive stretch LE's   Treatment 06/15/21 Nustep L 5 6 min Shld ext with pulleys 10# 2 sets 10 Row with cable pulleys  15# 2 sets  10 AR press and circles 10x each 15# pulleys Trunk ext 20 x black tband Lat pull 25# 2 sets 10 Bridge with ball squeeze 15 x Bridge with clam green tband 15x Hip flex with green tband 20 x PROM and stretch hip flex,quad,HS,piriformis and IT   PATIENT EDUCATION:  Education details: HEP Person educated: Patient Education method: Explanation, Demonstration, and Handouts Education comprehension: verbalized understanding and returned demonstration   HOME EXERCISE PROGRAM: Access Code: [email or web address] URL: [email or web address] Date: 05/15/2021 Prepared by: Lum Babe   ASSESSMENT:  CLINICAL IMPRESSION:     Pt enters today noting no pain, just tightness in right lower back, says he did a lot of yardwork yesterday. Pt participated well in all back/core strengthening exercises. States theraband exercises currently irritate his elbow pain, so we focused on more core exercises. Also states he has an appointment with his doctor next week for his elbow pain. Pt still very tight in hamstrings, R > L. Will benefit from additional strengthening and stretching.   OBJECTIVE IMPAIRMENTS decreased ROM, decreased strength, increased muscle spasms, impaired flexibility, improper body mechanics, and pain.   ACTIVITY LIMITATIONS cleaning, yard work, and yard work.    REHAB POTENTIAL: Excellent  CLINICAL DECISION MAKING: Stable/uncomplicated  EVALUATION COMPLEXITY: Low   GOALS: Goals reviewed with patient? Yes  SHORT TERM GOALS: Target date: 06/08/2021  Independent with initial HEP Baseline: Goal status: met   LONG TERM GOALS: Target date: 09/13/21  Understand posture and body mechanics Baseline:  Goal status: Met  2.  Decrease pain overall 50% Baseline:  Goal status: 07/13/21 - Progressing, 4-5/10 at most and much improvement   3.  Increase SLR to 70 degrees Baseline:  Goal status: 07/13/21 - Progressing, still needs additional stretching  4.  Independent with advanced  HEP Baseline:  Goal status: Met   PLAN: PT FREQUENCY: 1x/week  PT DURATION: 12 weeks  PLANNED INTERVENTIONS: Therapeutic exercises, Therapeutic activity, Neuromuscular re-education, Balance training, Gait training, Patient/Family education, Joint mobilization, and Dry Needling.  PLAN FOR NEXT SESSION: assess and progress  Bradd Merlos, SPTA 07/13/2021, 8:48 AM

## 2021-07-20 ENCOUNTER — Ambulatory Visit: Payer: Medicare Other | Admitting: Physical Therapy

## 2021-07-20 DIAGNOSIS — M25521 Pain in right elbow: Secondary | ICD-10-CM | POA: Diagnosis not present

## 2021-07-24 ENCOUNTER — Ambulatory Visit: Payer: Medicare Other | Admitting: Physical Therapy

## 2021-07-24 ENCOUNTER — Encounter: Payer: Self-pay | Admitting: Physical Therapy

## 2021-07-24 DIAGNOSIS — L570 Actinic keratosis: Secondary | ICD-10-CM | POA: Diagnosis not present

## 2021-07-24 DIAGNOSIS — L821 Other seborrheic keratosis: Secondary | ICD-10-CM | POA: Diagnosis not present

## 2021-07-24 DIAGNOSIS — Z85828 Personal history of other malignant neoplasm of skin: Secondary | ICD-10-CM | POA: Diagnosis not present

## 2021-07-24 DIAGNOSIS — D225 Melanocytic nevi of trunk: Secondary | ICD-10-CM | POA: Diagnosis not present

## 2021-07-24 DIAGNOSIS — M5459 Other low back pain: Secondary | ICD-10-CM

## 2021-07-24 DIAGNOSIS — L219 Seborrheic dermatitis, unspecified: Secondary | ICD-10-CM | POA: Diagnosis not present

## 2021-07-24 DIAGNOSIS — L578 Other skin changes due to chronic exposure to nonionizing radiation: Secondary | ICD-10-CM | POA: Diagnosis not present

## 2021-07-24 DIAGNOSIS — Z86018 Personal history of other benign neoplasm: Secondary | ICD-10-CM | POA: Diagnosis not present

## 2021-07-24 NOTE — Therapy (Addendum)
OUTPATIENT PHYSICAL THERAPY THORACOLUMBAR  Progress Note Reporting Period 05/15/21 to 07/24/21  See note below for Objective Data and Assessment of Progress/Goals.     Patient Name: Miguel Wolfe MRN: [id number] DOB:10/22/1945, 75 y.o., male Today's Date: 07/24/2021   PT End of Session - 07/24/21 1254     Visit Number 10    Date for PT Re-Evaluation 08/15/21    Authorization Type Medicare    PT Start Time 1255    PT Stop Time 1340    PT Time Calculation (min) 45 min    Activity Tolerance Patient tolerated treatment well    Behavior During Therapy WFL for tasks assessed/performed              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   PONV (postoperative nausea and vomiting)    Pre-diabetes    Pulmonary nodules    Thyroid cancer (HCC)    Past Surgical History:  Procedure Laterality Date   CARDIAC CATHETERIZATION     COLONOSCOPY WITH ESOPHAGOGASTRODUODENOSCOPY (EGD)  2008   CORONARY STENT INTERVENTION N/A 02/20/2016   Procedure: Coronary Stent Intervention;  Surgeon: Christopher D McAlhany, MD;  [facility]: [facility];  Service: Cardiovascular;  Laterality: N/A;   ESOPHAGOGASTRODUODENOSCOPY     LEFT HEART CATH AND CORONARY ANGIOGRAPHY N/A 02/20/2016   Procedure: Left Heart Cath and Coronary Angiography;  Surgeon: Christopher D McAlhany, MD;  Location: [facility];  Service: Cardiovascular;  Laterality: N/A;   PILONIDAL CYCT     REFRACTIVE SURGERY     retina tear repair   THYROIDECTOMY N/A  01/17/2018   Procedure: TOTAL THYROIDECTOMY;  Surgeon: Gerkin, Todd, MD;  Location: [facility];  Service: General;  Laterality: N/A;   VIDEO ASSISTED THORACOSCOPY (VATS)/WEDGE RESECTION Left 06/24/2018   Procedure: VIDEO ASSISTED THORACOSCOPY (VATS)/LUNG RESECTION;  Surgeon: Gerhardt, Edward B, MD;  Location: [facility];  Service: Thoracic;  Laterality: Left;   VIDEO BRONCHOSCOPY WITH ENDOBRONCHIAL NAVIGATION N/A 11/18/2017   Procedure: VIDEO BRONCHOSCOPY WITH ENDOBRONCHIAL NAVIGATION WITH BIOPSIES OF LEFT AND RIGHT UPPER LOBES;  Surgeon: Gerhardt, Edward B, MD;  Location: [facility];  Service: Thoracic;  Laterality: N/A;   VIDEO BRONCHOSCOPY WITH ENDOBRONCHIAL NAVIGATION N/A 06/11/2018   Procedure: VIDEO BRONCHOSCOPY WITH ENDOBRONCHIAL NAVIGATION;  Surgeon: Gerhardt, Edward B, MD;  Location: [facility];  Service: Thoracic;  Laterality: N/A;   Patient Active Problem List   Diagnosis Date Noted   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   Genetic testing 01/15/2019   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   Family history of colon cancer    Family history of stomach cancer    Lung cancer, 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HCC) 06/25/2018   Lung nodule 06/24/2018   Multiple thyroid nodules 01/15/2018   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   Coronary artery disease involving native coronary artery of native heart without angina   pectoris 04/23/2016   Hyperlipidemia    GERD (gastroesophageal reflux disease)    Essential hypertension 02/16/2016    PCP: [place]   REFERRING PROVIDER: Nundkumar  REFERRING DIAG: Spondylosis  THERAPY DIAG:  Other low back pain  ONSET DATE: April 2023   SUBJECTIVE:                                                                                                                                                                                           SUBJECTIVE STATEMENT:    I feel good, no pain except sometimes in my right elbow when I do certain things.    PERTINENT HISTORY:  IMPRESSION: 1. Continued interval enlargement of an enhancing bone lesion within the L1 vertebral body now measuring 3.6 x 3.4 cm (previously measured 2.8 x 2.7 cm on 09/06/2020). There is bulging of the posterior wall of the vertebral body with presumed extension into the ventral epidural space. Both aggressive hemangioma and metastatic disease remain in the differential. 2. Stable elongated enhancing right paraspinal soft tissue mass adjacent to the L1 vertebral body measuring 5.1 x 1.9 x 2.3 cm. 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 3. Stable mild degenerative lumbar spondylosis, most pronounced at the L5-S1 level where there is mild bilateral foraminal stenosis.  PAIN:  Are you having pain? No   PRECAUTIONS:   Has had lung cancer  WEIGHT BEARING RESTRICTIONS No   PATIENT GOALS feel stronger   OBJECTIVE:  07/24/21 NuStep L5 x 6 min, LE only Feet on ball KTC, obliques and bridges 1x10 each SLR 2# 1x10 each, then 1x10 with hip in ER Lat pull down 25# 2x12, attempted rows but hurt R elbow Leg press 50# 2x10 Bird dog x10 Stretching B hamstrings R > L   07/13/21 NuStep L5 x 6 min Rows/lat pull down 25# 2x10 Back extension with black TB 1x10 Feet on ball KTC, Obliques, bridges and isometric abdominals,  2x10 each Passive hamstring/piriformis stretch with STM 2x15", R > L   Treatment 07/04/21 Bike level 4 x 6 minutes 2 laps walking good pace 10# 2x10 leg extension Leg curls 25# 2x10 Tried AR press but this caused elbow pain so we stopped 20# farmer carry each arm 2 laps Feet on ball K2C, trunk rotation, bridges and isometric abs Passive stretchLE's   Treatment 06/27/21  Nustep L 5 6 min Leg Press 50# 3 sets 10 the calf raises 2 sets 15 Farmers Carry 8# 1 lap each way Seated Row and Lat Pull 25# 2 sets 15 Black Tband trunk ext 20 x Core Stab Ex Supine 10 min Prone Hip Ext 10 each (   opp arm and leg increased  pain) Passive stretching LE and trunk   Treatment 06/21/21: Walk 2 laps Seated Row 25# 2x10. Lats 25# 2x10 5# 2x10 straight arm pulls for core activation 10# AR press showed him how to do this at home with tband 40# leg press 2x10 Supine feet on ball K2C, trunk rotation, small bridges and isometric abs Passive stretch LE's   Treatment 06/15/21 Nustep L 5 6 min Shld ext with pulleys 10# 2 sets 10 Row with cable pulleys 15# 2 sets 10 AR press and circles 10x each 15# pulleys Trunk ext 20 x black tband Lat pull 25# 2 sets 10 Bridge with ball squeeze 15 x Bridge with clam green tband 15x Hip flex with green tband 20 x PROM and stretch hip flex,quad,HS,piriformis and IT   PATIENT EDUCATION:  Education details:Stretching, HEP Person educated: Patient Education method: Explanation, Demonstration, and Handouts Education comprehension: verbalized understanding and returned demonstration   HOME EXERCISE PROGRAM: Access Code: [email or web address] URL: [email or web address] Date: 05/15/2021 Prepared by: Lum Babe   ASSESSMENT:  CLINICAL IMPRESSION:     Pt enters today with no back pain but states his right elbow has still been bothering him. Unable to do some resistance exercises due to pain in right elbow, so we continued with some on mat table. States his overall back pain has improved since starting PT. Found quadriped exercises to be challenging. Pt still very tight in hamstrings, was educated on continuing stretching at home and while on vacation next week.    OBJECTIVE IMPAIRMENTS decreased ROM, decreased strength, increased muscle spasms, impaired flexibility, improper body mechanics, and pain.   ACTIVITY LIMITATIONS cleaning, yard work, and yard work.    REHAB POTENTIAL: Excellent  CLINICAL DECISION MAKING: Stable/uncomplicated  EVALUATION COMPLEXITY: Low   GOALS: Goals reviewed with patient? Yes  SHORT TERM GOALS: Target date: 06/08/2021  Independent  with initial HEP Baseline: Goal status: met   LONG TERM GOALS: Target date: 09/13/21  Understand posture and body mechanics Baseline:  Goal status: Met  2.  Decrease pain overall 50% Baseline:  Goal status: 07/24/21 - Met  3.  Increase SLR to 70 degrees Baseline:  Goal status: 07/24/21 - Progressing, still needs additional stretching  4.  Independent with advanced HEP Baseline:  Goal status: Met   PLAN: PT FREQUENCY: 1x/week  PT DURATION: 12 weeks  PLANNED INTERVENTIONS: Therapeutic exercises, Therapeutic activity, Neuromuscular re-education, Balance training, Gait training, Patient/Family education, Joint mobilization, and Dry Needling.  PLAN FOR NEXT SESSION: Assess how right elbow is feeling, strengthening exercises.   Alma, SPTA 07/24/2021, 12:55 PM

## 2021-08-07 ENCOUNTER — Encounter: Payer: Self-pay | Admitting: Cardiology

## 2021-08-07 ENCOUNTER — Ambulatory Visit (INDEPENDENT_AMBULATORY_CARE_PROVIDER_SITE_OTHER): Payer: Medicare Other | Admitting: Cardiology

## 2021-08-07 VITALS — BP 108/40 | HR 66 | Ht 71.0 in | Wt 167.0 lb

## 2021-08-07 DIAGNOSIS — I2510 Atherosclerotic heart disease of native coronary artery without angina pectoris: Secondary | ICD-10-CM | POA: Diagnosis not present

## 2021-08-07 DIAGNOSIS — E785 Hyperlipidemia, unspecified: Secondary | ICD-10-CM

## 2021-08-07 DIAGNOSIS — I10 Essential (primary) hypertension: Secondary | ICD-10-CM

## 2021-08-07 NOTE — Progress Notes (Signed)
Cardiology Office Note:    Date:  08/07/2021   ID:  Miguel Wolfe, DOB March 31, 1945, MRN [id number]  PCP:  [name], MD   [facility] Providers Cardiologist:  Candee Furbish, MD     Referring MD: [name], MD    History of Present Illness:    Miguel Wolfe is a 76 y.o. male here for follow-up coronary artery disease.  Cardiac catheterization 02/2016-DES to RCA  Thyroid cancer, lung cancer status post left upper lobectomy in 2020  Ultrasound of aorta 2021 no aneurysm  Echocardiogram 2022-EF 65% with mild LVH  Had some statin intolerance with both atorvastatin and rosuvastatin.  Continues to be on Zetia.  Oncology follows him for L1 vertebral body in 2022 suspicious for atypical hemangioma not felt to be osseous metastasis.  Recently had great family vacation at [facility].  Overall feels well golfing walking no difficulties no anginal symptoms.  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   PONV (postoperative nausea and vomiting)    Pre-diabetes    Pulmonary nodules    Thyroid cancer ([place])     Past Surgical History:  Procedure Laterality Date   CARDIAC CATHETERIZATION     COLONOSCOPY WITH ESOPHAGOGASTRODUODENOSCOPY (EGD)  2008   CORONARY STENT INTERVENTION N/A 02/20/2016   Procedure: Coronary Stent Intervention;  Surgeon: Burnell Blanks, MD;  Location: [place] [facility];  Service: Cardiovascular;  Laterality: N/A;   ESOPHAGOGASTRODUODENOSCOPY     LEFT HEART CATH AND CORONARY ANGIOGRAPHY  N/A 02/20/2016   Procedure: Left Heart Cath and Coronary Angiography;  Surgeon: Burnell Blanks, MD;  Location: [place] [facility];  Service: Cardiovascular;  Laterality: N/A;   PILONIDAL CYCT     REFRACTIVE SURGERY     retina tear repair   THYROIDECTOMY N/A 01/17/2018   Procedure: TOTAL THYROIDECTOMY;  Surgeon: Armandina Gemma, MD;  Location: [facility];  Service: General;  Laterality: N/A;   VIDEO ASSISTED THORACOSCOPY (VATS)/WEDGE RESECTION Left 06/24/2018   Procedure: VIDEO ASSISTED THORACOSCOPY (VATS)/LUNG RESECTION;  Surgeon: Grace Isaac, MD;  Location: [place];  Service: Thoracic;  Laterality: Left;   VIDEO BRONCHOSCOPY WITH ENDOBRONCHIAL NAVIGATION N/A 11/18/2017   Procedure: VIDEO BRONCHOSCOPY WITH ENDOBRONCHIAL NAVIGATION WITH BIOPSIES OF LEFT AND RIGHT UPPER LOBES;  Surgeon: Grace Isaac, MD;  Location: [place];  Service: Thoracic;  Laterality: N/A;   VIDEO BRONCHOSCOPY WITH ENDOBRONCHIAL NAVIGATION N/A 06/11/2018   Procedure: VIDEO BRONCHOSCOPY WITH ENDOBRONCHIAL NAVIGATION;  Surgeon: Grace Isaac, MD;  Location: [place];  Service: Thoracic;  Laterality: N/A;    Current Medications: Current Meds  Medication Sig   acetaminophen (TYLENOL) 325 MG tablet Take 650 mg by mouth every 6 (six) hours as needed for moderate pain or headache.    amLODipine (NORVASC) 10 MG tablet Take 10 mg by mouth daily.   aspirin EC 81 MG tablet Take 81 mg by mouth daily.  carvedilol (COREG) 6.25 MG tablet Take 1 tablet (6.25 mg total) by mouth 2 (two) times daily with a meal.   ezetimibe (ZETIA) 10 MG tablet Take 1 tablet (10 mg total) by mouth daily.   famotidine (PEPCID) 20 MG tablet Take 20 mg by mouth daily.   ibuprofen (ADVIL,MOTRIN) 200 MG tablet Take 400 mg by mouth every 8 (eight) hours as needed for headache or moderate pain.    levothyroxine (SYNTHROID) 150 MCG tablet TAKE 1 TABLET BY MOUTH EVERY DAY IN THE MORNING ON AN EMPTY STOMACH   losartan (COZAAR) 100 MG tablet Take 100 mg by  mouth daily.   nitroGLYCERIN (NITROSTAT) 0.4 MG SL tablet Place 1 tablet (0.4 mg total) under the tongue every 5 (five) minutes as needed for chest pain.   Omega-3 Fatty Acids (FISH OIL) 1000 MG CAPS Take 2 capsules by mouth in the morning and at bedtime.   pantoprazole (PROTONIX) 40 MG tablet Take 40 mg by mouth every other day.   pravastatin (PRAVACHOL) 80 MG tablet TAKE 1 TABLET(80 MG) BY MOUTH DAILY   zolpidem (AMBIEN) 10 MG tablet Take 10 mg by mouth at bedtime as needed for sleep.      Allergies:   Gemfibrozil, Other, Propranolol, Niaspan [niacin er], and Simvastatin   Social History   Socioeconomic History   Marital status: Married    Spouse name: Not on file   Number of children: Not on file   Years of education: Not on file   Highest education level: Not on file  Occupational History   Not on file  Tobacco Use   Smoking status: Former    Packs/day: 1.00    Years: 3.00    Total pack years: 3.00    Types: Cigarettes    Quit date: 02/01/1967    Years since quitting: 54.5   Smokeless tobacco: Never  Vaping Use   Vaping Use: Never used  Substance and Sexual Activity   Alcohol use: Yes    Comment: a drink before dinner a few times a week   Drug use: No   Sexual activity: Not on file  Other Topics Concern   Not on file  Social History Narrative   Not on file   Social Determinants of Health   Financial Resource Strain: Not on file  Food Insecurity: Not on file  Transportation Needs: Not on file  Physical Activity: Not on file  Stress: Not on file  Social Connections: Not on file     Family History: The patient's family history includes Cancer in his paternal uncle and paternal uncle; Colon cancer (age of onset: 70) in his cousin; Crohn's disease in his sister; Healthy in his sister; Heart attack (age of onset: 31) in his father; Stomach cancer in his paternal uncle; Throat cancer in his paternal uncle; Thyroid cancer in his daughter.  ROS:   Please see the  history of present illness.     All other systems reviewed and are negative.  EKGs/Labs/Other Studies Reviewed:    The following studies were reviewed today:   2D Echo 05/2021   1. Left ventricular ejection fraction, by estimation, is 60 to 65%. The  left ventricle has normal function. The left ventricle has no regional  wall motion abnormalities. The left ventricular internal cavity size was  mildly dilated. Left ventricular  diastolic parameters are consistent with Grade I diastolic dysfunction  (impaired relaxation).   2. Right ventricular systolic function is normal. The right ventricular  size is normal.  3. The mitral valve is normal in structure. No evidence of mitral valve  regurgitation. No evidence of mitral stenosis.   4. The aortic valve is tricuspid. Aortic valve regurgitation is mild to  moderate. Aortic valve sclerosis is present, with no evidence of aortic  valve stenosis.   5. Aortic dilatation noted. There is mild dilatation of the aortic root,  measuring 41 mm. There is mild dilatation of the ascending aorta,  measuring 41 mm.   6. The inferior vena cava is normal in size with greater than 50%  respiratory variability, suggesting right atrial pressure of 3 mmHg.    Cath 2/20218 Acute Mrg lesion, 90 %stenosed. Mid RCA lesion, 20 %stenosed. Dist RCA lesion, 30 %stenosed. Prox Cx to Mid Cx lesion, 20 %stenosed. 1st Diag lesion, 20 %stenosed. Dist LAD lesion, 20 %stenosed. A STENT SYNERGY DES 4X24 drug eluting stent was successfully placed. Prox RCA to Mid RCA lesion, 80 %stenosed. Post intervention, there is a 0% residual stenosis.   1. Severe single vessel CAD (severe stenosis mid RCA) 2. Successful PTCA/DES x 1 mid RCA  3. Mild non-obstructive disease Circumflex and LAD   Recommendations: Will continue DAPT with ASA and Plavix for one year. If he needs to interrupt anti-platelet therapy for a toe surgery, this could be considered at 3 months since a  Synergy DES was placed, although it would be optimal to continue for at least 6 months. Will discharge home today as part of the same day PCI protocol. Will changed Prilosec to Protonix 40 mg daily. He will be started on Plavix 75 mg daily. Follow up with Dr. Marlou Porch or office APP in 1 week.     4. The aortic valve is normal in structure. Aortic valve regurgitation is  mild to moderate. No aortic stenosis is present.   5. There is borderline dilatation of the aortic root, measuring 40 mm.   6. The inferior vena cava is normal in size with greater than 50%  respiratory variability, suggesting right atrial pressure of 3 mmHg.      Recent Labs: 03/01/2021: BUN 14; Creatinine 0.74; Hemoglobin 15.2; Platelet Count 152; Potassium 3.6; Sodium 143 03/13/2021: ALT 18  Recent Lipid Panel    Component Value Date/Time   CHOL 131 03/13/2021 0753   TRIG 99 03/13/2021 0753   HDL 50 03/13/2021 0753   CHOLHDL 2.6 03/13/2021 0753   LDLCALC 63 03/13/2021 0753     Risk Assessment/Calculations:              Physical Exam:    VS:  BP (!) 108/40   Pulse 66   Ht 5\' 11"  (1.803 m)   Wt 167 lb (75.8 kg)   SpO2 98%   BMI 23.29 kg/m     Wt Readings from Last 3 Encounters:  08/07/21 167 lb (75.8 kg)  05/02/21 162 lb (73.5 kg)  03/24/21 172 lb (78 kg)     GEN:  Well nourished, well developed in no acute distress HEENT: Normal NECK: No JVD; No carotid bruits LYMPHATICS: No lymphadenopathy CARDIAC: RRR, no murmurs, no rubs, gallops RESPIRATORY:  Clear to auscultation without rales, wheezing or rhonchi  ABDOMEN: Soft, non-tender, non-distended MUSCULOSKELETAL:  No edema; No deformity  SKIN: Warm and dry NEUROLOGIC:  Alert and oriented x 3 PSYCHIATRIC:  Normal affect   ASSESSMENT:    1. Coronary artery disease involving native coronary artery of native heart without angina pectoris   2. Hyperlipidemia, unspecified hyperlipidemia type   3. Essential hypertension  PLAN:    In order of  problems listed above:  Coronary artery disease - Stent to RCA 2018.  Doing very well.  Aspirin 81 mg.  No bleeding. - He will occasionally take Advil very low-dose.  I think this is reasonable.  Renal function is normal. -No anginal symptoms   Hypertension - Continue with losartan 100 mg a day, amlodipine 10 mg a day, carvedilol 6.25 mg twice a day.  No changes made.  Well-controlled.  Hyperlipidemia - Currently on Pravachol or pravastatin 80 mg a day as well as Zetia 10 mg a day.  He is at goal with LDL of 65.  Had prior statin intolerances.  Previously worked with our lipid clinic.  Very appreciative.  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  Likely repeat echo in 1 year.  History of lung cancer and thyroid cancer - Oncology team monitoring this closely.  Doing well.  Has not gone back to the gym with COVID virus in the community.          Medication Adjustments/Labs and Tests Ordered: Current medicines are reviewed at length with the patient today.  Concerns regarding medicines are outlined above.  No orders of the defined types were placed in this encounter.  No orders of the defined types were placed in this encounter.   Patient Instructions  Medication Instructions:  The current medical regimen is effective;  continue present plan and medications.  *If you need a refill on your cardiac medications before your next appointment, please call your pharmacy*  Follow-Up: At [facility], you and your health needs are our priority.  As part of our continuing mission to provide you with exceptional heart care, we have created designated Provider Care Teams.  These Care Teams include your primary Cardiologist (physician) and Advanced Practice Providers (APPs -  Physician Assistants and Nurse Practitioners) who all work together to provide you with the care you need, when you need it.  We recommend signing up for the patient portal called "MyChart".   Sign up information is provided on this After Visit Summary.  MyChart is used to connect with patients for Virtual Visits (Telemedicine).  Patients are able to view lab/test results, encounter notes, upcoming appointments, etc.  Non-urgent messages can be sent to your provider as well.   To learn more about what you can do with MyChart, go to [email or web address].    Your next appointment:   6 month(s)  The format for your next appointment:   In Person  Provider:   Melina Copa, PA-C     Then, Candee Furbish, MD will plan to see you again in 1 year(s).{   Important Information About Sugar         Signed, Candee Furbish, MD  08/07/2021 10:41 AM    [facility]

## 2021-08-07 NOTE — Patient Instructions (Signed)
Medication Instructions:  The current medical regimen is effective;  continue present plan and medications.  *If you need a refill on your cardiac medications before your next appointment, please call your pharmacy*  Follow-Up: At [facility], you and your health needs are our priority.  As part of our continuing mission to provide you with exceptional heart care, we have created designated Provider Care Teams.  These Care Teams include your primary Cardiologist (physician) and Advanced Practice Providers (APPs -  Physician Assistants and Nurse Practitioners) who all work together to provide you with the care you need, when you need it.  We recommend signing up for the patient portal called "MyChart".  Sign up information is provided on this After Visit Summary.  MyChart is used to connect with patients for Virtual Visits (Telemedicine).  Patients are able to view lab/test results, encounter notes, upcoming appointments, etc.  Non-urgent messages can be sent to your provider as well.   To learn more about what you can do with MyChart, go to [email or web address].    Your next appointment:   6 month(s)  The format for your next appointment:   In Person  Provider:   Melina Copa, PA-C     Then, Candee Furbish, MD will plan to see you again in 1 year(s).{   Important Information About Sugar

## 2021-08-08 ENCOUNTER — Ambulatory Visit: Payer: Medicare Other | Admitting: Physical Therapy

## 2021-08-08 ENCOUNTER — Other Ambulatory Visit: Payer: Self-pay | Admitting: Family Medicine

## 2021-08-08 ENCOUNTER — Other Ambulatory Visit: Payer: Self-pay | Admitting: Cardiology

## 2021-08-08 DIAGNOSIS — R519 Headache, unspecified: Secondary | ICD-10-CM | POA: Diagnosis not present

## 2021-08-08 DIAGNOSIS — Z85118 Personal history of other malignant neoplasm of bronchus and lung: Secondary | ICD-10-CM | POA: Diagnosis not present

## 2021-08-09 ENCOUNTER — Ambulatory Visit
Admission: RE | Admit: 2021-08-09 | Discharge: 2021-08-09 | Disposition: A | Discharge status: Home / Self Care | Payer: Medicare Other | Source: Ambulatory Visit | Attending: Family Medicine | Admitting: Family Medicine

## 2021-08-09 DIAGNOSIS — R519 Headache, unspecified: Secondary | ICD-10-CM

## 2021-08-15 ENCOUNTER — Ambulatory Visit: Payer: Medicare Other | Attending: Neurosurgery | Admitting: Physical Therapy

## 2021-08-15 DIAGNOSIS — M5459 Other low back pain: Secondary | ICD-10-CM | POA: Diagnosis not present

## 2021-08-15 NOTE — Therapy (Signed)
OUTPATIENT PHYSICAL THERAPY THORACOLUMBAR  Progress Note    Patient Name: Miguel Wolfe MRN: [id number] DOB:07/16/1945, 76 y.o., male Today's Date: 08/15/2021   PT End of Session - 08/15/21 0901     Visit Number 11    Date for PT Re-Evaluation 08/15/21    Authorization Type Medicare    PT Start Time 0845    PT Stop Time 0930    PT Time Calculation (min) 45 min              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   PONV (postoperative nausea and vomiting)    Pre-diabetes    Pulmonary nodules    Thyroid cancer ([place])    Past Surgical History:  Procedure Laterality Date   CARDIAC CATHETERIZATION     COLONOSCOPY WITH ESOPHAGOGASTRODUODENOSCOPY (EGD)  2008   CORONARY STENT INTERVENTION N/A 02/20/2016   Procedure: Coronary Stent Intervention;  Surgeon: Burnell Blanks, MD;  Location: [facility];  Service: Cardiovascular;  Laterality: N/A;   ESOPHAGOGASTRODUODENOSCOPY     LEFT HEART CATH AND CORONARY ANGIOGRAPHY N/A 02/20/2016   Procedure: Left Heart Cath and Coronary Angiography;  Surgeon: Burnell Blanks, MD;  Location: [facility];  Service: Cardiovascular;  Laterality: N/A;   PILONIDAL CYCT     REFRACTIVE SURGERY     retina tear repair   THYROIDECTOMY N/A 01/17/2018   Procedure: TOTAL THYROIDECTOMY;  Surgeon: Armandina Gemma, MD;  Location: [facility];  Service: General;  Laterality: N/A;   VIDEO ASSISTED THORACOSCOPY (VATS)/WEDGE RESECTION Left 06/24/2018   Procedure: VIDEO ASSISTED  THORACOSCOPY (VATS)/LUNG RESECTION;  Surgeon: Grace Isaac, MD;  Location: [facility];  Service: Thoracic;  Laterality: Left;   VIDEO BRONCHOSCOPY WITH ENDOBRONCHIAL NAVIGATION N/A 11/18/2017   Procedure: VIDEO BRONCHOSCOPY WITH ENDOBRONCHIAL NAVIGATION WITH BIOPSIES OF LEFT AND RIGHT UPPER LOBES;  Surgeon: Grace Isaac, MD;  Location: [facility];  Service: Thoracic;  Laterality: N/A;   VIDEO BRONCHOSCOPY WITH ENDOBRONCHIAL NAVIGATION N/A 06/11/2018   Procedure: VIDEO BRONCHOSCOPY WITH ENDOBRONCHIAL NAVIGATION;  Surgeon: Grace Isaac, MD;  Location: [facility];  Service: Thoracic;  Laterality: N/A;   Patient Active Problem List   Diagnosis Date Noted   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   Genetic testing 40/98/1191   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   Family history of colon cancer    Family history of stomach cancer    Lung cancer, 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[facility]) 06/25/2018   Lung nodule 06/24/2018   Multiple thyroid nodules 01/15/2018   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   Coronary artery disease involving native coronary artery of native heart without angina pectoris 04/23/2016   Hyperlipidemia    GERD (gastroesophageal reflux disease)    Essential hypertension 02/16/2016    PCP: Moreen Fowler   REFERRING PROVIDER: Kathyrn Sheriff  REFERRING DIAG: Spondylosis  THERAPY DIAG:  Other low back pain  ONSET DATE: April 2023   SUBJECTIVE:                                                                                                                                                                                           SUBJECTIVE STATEMENT:    I feel good, ready for DC PERTINENT HISTORY:  IMPRESSION: 1. Continued interval enlargement of an enhancing bone lesion within the L1 vertebral body now measuring 3.6 x 3.4 cm (previously measured 2.8 x 2.7 cm on 09/06/2020). There is bulging of the posterior wall of the vertebral body with presumed extension  into the ventral epidural space. Both aggressive hemangioma and metastatic disease remain in the differential. 2. Stable elongated enhancing right paraspinal soft tissue mass adjacent to the L1 vertebral body measuring 5.1 x 1.9 x 2.3 cm. 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 3. Stable mild degenerative lumbar spondylosis, most pronounced at the L5-S1 level where there is mild bilateral foraminal stenosis.  PAIN:  Are you having pain? No   PRECAUTIONS:   Has had lung cancer  WEIGHT BEARING RESTRICTIONS No   PATIENT GOALS feel stronger   OBJECTIVE:   08/15/21 Reviewed and added core ex to HEP Nustep 6 min   07/24/21 NuStep L5 x 6 min, LE only Feet on ball KTC, obliques and bridges 1x10 each SLR 2# 1x10 each, then 1x10 with hip in ER Lat pull down 25# 2x12, attempted rows but hurt R elbow Leg press 50# 2x10 Bird dog x10 Stretching B hamstrings R > L   07/13/21 NuStep L5 x 6 min Rows/lat pull down 25# 2x10 Back extension with black TB 1x10 Feet on ball KTC, Obliques, bridges and isometric abdominals,  2x10 each Passive hamstring/piriformis stretch with STM 2x15", R > L   Treatment 07/04/21 Bike level 4 x 6 minutes 2 laps walking good pace 10# 2x10 leg extension Leg curls 25# 2x10 Tried AR press but this caused elbow pain so we stopped 20# farmer carry each arm 2 laps Feet on ball K2C, trunk rotation, bridges and isometric abs Passive stretchLE's   Treatment 06/27/21  Nustep L 5 6 min Leg Press 50# 3 sets 10 the calf raises 2 sets 15 Farmers Carry 8# 1 lap each way Seated Row and Lat Pull 25# 2 sets 15 Black Tband trunk ext 20 x Core Stab Ex Supine 10 min Prone Hip Ext 10 each ( opp arm and leg increased pain) Passive stretching LE and trunk   Treatment 06/21/21: Walk 2 laps Seated Row 25# 2x10. Lats 25# 2x10 5# 2x10 straight arm pulls for core activation  10# AR press showed him how to do this at home with tband 40# leg press  2x10 Supine feet on ball K2C, trunk rotation, small bridges and isometric abs Passive stretch LE's   Treatment 06/15/21 Nustep L 5 6 min Shld ext with pulleys 10# 2 sets 10 Row with cable pulleys 15# 2 sets 10 AR press and circles 10x each 15# pulleys Trunk ext 20 x black tband Lat pull 25# 2 sets 10 Bridge with ball squeeze 15 x Bridge with clam green tband 15x Hip flex with green tband 20 x PROM and stretch hip flex,quad,HS,piriformis and IT   PATIENT EDUCATION:  Education details:Stretching, HEP Person educated: Patient Education method: Explanation, Demonstration, and Handouts Education comprehension: verbalized understanding and returned demonstration   HOME EXERCISE PROGRAM: Access Code:   [email or web address] URL: [email or web address] Date: 08/15/21 Prepared by: APayseur   ASSESSMENT:  CLINICAL IMPRESSION:   arrived feeling good and ready for D/C. Reviewed and added to HEP. Goals met   OBJECTIVE IMPAIRMENTS decreased ROM, decreased strength, increased muscle spasms, impaired flexibility, improper body mechanics, and pain.   ACTIVITY LIMITATIONS cleaning, yard work, and yard work.    REHAB POTENTIAL: Excellent  CLINICAL DECISION MAKING: Stable/uncomplicated  EVALUATION COMPLEXITY: Low   GOALS: Goals reviewed with patient? Yes  SHORT TERM GOALS: Target date: 06/08/2021  Independent with initial HEP Baseline: Goal status: met   LONG TERM GOALS: Target date: 09/13/21  Understand posture and body mechanics Baseline:  Goal status: Met  2.  Decrease pain overall 50% Baseline:  Goal status: 07/24/21 - Met  3.  Increase SLR to 70 degrees Baseline:  Goal status: 07/24/21 - Progressing, still needs additional stretching. 8./8 met  4.  Independent with advanced HEP Baseline:  Goal status: Met   PLAN: PT FREQUENCY: 1x/week  PT DURATION: 12 weeks  PLANNED INTERVENTIONS: Therapeutic exercises, Therapeutic activity, Neuromuscular re-education,  Balance training, Gait training, Patient/Family education, Joint mobilization, and Dry Needling.  PLAN FOR NEXT SESSION:  D/C  Smith Potenza,ANGIE, PTA, SPTA 08/15/2021, 9:02 AM [place] [place], [place], [address] Phone: [phone]   Fax:  [phone]  Patient Details  Name: FADI MENTER MRN: [id number] Date of Birth: [birth year] Referring Provider:  [name], MD  Encounter Date: 08/15/2021  PHYSICAL THERAPY DISCHARGE SUMMARY  Patient agrees to discharge. Patient goals were met. Patient is being discharged due to meeting the stated rehab goals.    Thandiwe Siragusa,ANGIE, PTA 08/15/2021, 9:02 AM  [place] [place], [place], [address] Phone: [phone]   Fax:  [phone]

## 2021-08-31 NOTE — Progress Notes (Signed)
NEUROLOGY FOLLOW UP OFFICE NOTE  MARLOW BERENGUER [id number]  Assessment/Plan:   New onset left greater than right posterior headache - may be cervicogenic or tension-type but must rule out other etiologies in setting of past medical history of lung cancer.  Check MRI of brain with and without contrast Further recommendation pending results.  Defers starting a daily preventative headache medication but will contact me if we need to start one May take 1 Tylenol daily as needed for now but discussed rebound headache Follow up 4 months or sooner if needed.  Subjective:  MY MADARIAGA is a 76 year old male with CAD, HTN, HLD, pre-diabetes, hypothyroidism, arthritis and history of non-small cell lung cancer whom I previously saw for bilateral leg weakness secondary to statin presents today for headache.  In late June, he had a tooth filled.  A couple of weeks later, he began having a daily headache.  Described as a dull non-throbbing pain in the left parietal and occipital region but can then radiate.  Gets worse later in day.  Sometimes associated with mild nausea but no vomiting, photophobia, phonophobia or visual disturbance.  He doesn't wake up with the headache, usually starts around 10 AM.  Takes 1 Tylenol in morning and sometimes later Advil in afternoon.  Takes Tylenol 5 days a week and Advil 2 days a week.  Lasts sometimes after dinner for a couple of hours but otherwise until he goes to sleep.  He reports some neck stiffness with occasional popping but no significant pain.  CT head on 08/09/2021 personally reviewed was unremarkable.  Cold and heat may help.  Walking helps.  No known triggers.  Headache has not changed since onset.    Has past history of migraines when he was in his 68s.  He is concerned as he has history of lung cancer and thyroid cancer.  Past NSAIDS/analgesics:  tramadol Past abortive triptans:  none Past abortive ergotamine:  none Past muscle relaxants:  none Past  anti-emetic:  Zofran Past antihypertensive medications:  none Past antidepressant medications:  none Past anticonvulsant medications:  none Past anti-CGRP:  none Past vitamins/Herbal/Supplements:  none Past antihistamines/decongestants:  Zyrtec Other past therapies:  none  Current NSAIDS/analgesics:  Tylenol, Advil PRN, ASA 81mg  daily Current triptans:  none Current ergotamine:  none Current anti-emetic:  none Current muscle relaxants:  none Current Antihypertensive medications:  amlodipine, carvedilol, losartan Current Antidepressant medications:  none Current Anticonvulsant medications:  none Current anti-CGRP:  none Current Vitamins/Herbal/Supplements:  none Current Antihistamines/Decongestants:  none Other therapy:  none Hormone/birth control:  none Other medications:  Ambien, levothyroxine, NTG   Caffeine:  1.5 cups of coffee daily. Alcohol:  2 drinks (scotch or wine) daily Diet:  Water.  No soda.  Exercise:  walks.      PAST MEDICAL HISTORY: 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   PONV (postoperative nausea  and vomiting)    Pre-diabetes    Pulmonary nodules    Thyroid cancer ([facility])     MEDICATIONS: Current Outpatient Medications on File Prior to Visit  Medication Sig Dispense Refill   acetaminophen (TYLENOL) 325 MG tablet Take 650 mg by mouth every 6 (six) hours as needed for moderate pain or headache.      amLODipine (NORVASC) 10 MG tablet Take 10 mg by mouth daily.     aspirin EC 81 MG tablet  Take 81 mg by mouth daily.     carvedilol (COREG) 6.25 MG tablet Take 1 tablet (6.25 mg total) by mouth 2 (two) times daily with a meal. 180 tablet 3   ezetimibe (ZETIA) 10 MG tablet Take 1 tablet (10 mg total) by mouth daily. 90 tablet 3   famotidine (PEPCID) 20 MG tablet Take 20 mg by mouth daily.     ibuprofen (ADVIL,MOTRIN) 200 MG tablet Take 400 mg by mouth every 8 (eight) hours as needed for headache or moderate pain.      levothyroxine (SYNTHROID) 150 MCG tablet TAKE 1 TABLET BY MOUTH EVERY DAY IN THE MORNING ON AN EMPTY STOMACH     losartan (COZAAR) 100 MG tablet Take 100 mg by mouth daily.     nitroGLYCERIN (NITROSTAT) 0.4 MG SL tablet PLACE 1 TABLET UNDER THE TONGUE EVERY 5 MINUTES AS NEEDED FOR CHEST PAIN 25 tablet 11   Omega-3 Fatty Acids (FISH OIL) 1000 MG CAPS Take 2 capsules by mouth in the morning and at bedtime.     pantoprazole (PROTONIX) 40 MG tablet Take 40 mg by mouth every other day.     pravastatin (PRAVACHOL) 80 MG tablet TAKE 1 TABLET(80 MG) BY MOUTH DAILY 30 tablet 6   zolpidem (AMBIEN) 10 MG tablet Take 10 mg by mouth at bedtime as needed for sleep.   0   No current facility-administered medications on file prior to visit.    ALLERGIES: Allergies  Allergen Reactions   Gemfibrozil Other (See Comments)    Muscle pain   Other Other (See Comments)   Propranolol     Other reaction(s): SOB  Only had issue with Slow release propranolol   Niaspan [Niacin Er] Rash and Other (See Comments)    Flushing - aspirin did not mitigate    Simvastatin Other (See Comments)    Drug-Drug interaction with amlodipine    FAMILY HISTORY: Family History  Problem Relation Age of Onset   Heart attack Father 39       BYPASS   Crohn's disease Sister    Stomach cancer Paternal Uncle        diagnosed mid-30s   Healthy Sister    Throat cancer Paternal Uncle        hx of smoking   Cancer Paternal Uncle        unknown type   Cancer Paternal Uncle        unknown type   Colon  cancer Cousin 34       paternal cousin   Thyroid cancer Daughter        papillary      Objective:  Blood pressure 136/71, pulse 71, height 5\' 4"  (1.626 m), weight 168 lb 6.4 oz (76.4 kg), SpO2 99 %. General: No acute distress.  Patient appears well-groomed.   Head:  Normocephalic/atraumatic Eyes:  Fundi examined but not visualized Neck: supple, no paraspinal tenderness, full range of motion Heart:  Regular rate and rhythm Neurological Exam: alert and oriented to person, place, and  time.  Speech fluent and not dysarthric, language intact.  CN II-XII intact. Bulk and tone normal, muscle strength 5/5 throughout.  Sensation to light touch intact.  Deep tendon reflexes 2+ throughout, toes downgoing.  Finger to nose testing intact.  Gait normal, able to tandem walk.  Romberg negative.   Metta Clines, DO  CC: [name], MD

## 2021-09-01 ENCOUNTER — Encounter: Payer: Self-pay | Admitting: Neurology

## 2021-09-01 ENCOUNTER — Ambulatory Visit (INDEPENDENT_AMBULATORY_CARE_PROVIDER_SITE_OTHER): Payer: Medicare Other | Admitting: Neurology

## 2021-09-01 VITALS — BP 136/71 | HR 71 | Ht 64.0 in | Wt 168.4 lb

## 2021-09-01 DIAGNOSIS — I2510 Atherosclerotic heart disease of native coronary artery without angina pectoris: Secondary | ICD-10-CM | POA: Diagnosis not present

## 2021-09-01 DIAGNOSIS — R519 Headache, unspecified: Secondary | ICD-10-CM | POA: Diagnosis not present

## 2021-09-01 MED ORDER — LORAZEPAM 0.5 MG PO TABS
ORAL_TABLET | ORAL | 0 refills | Status: DC
Start: 2021-09-01 — End: 2021-10-03

## 2021-09-01 NOTE — Patient Instructions (Signed)
Will order MRI of brain with and without contrast.  Further recommendations pending results Contact me if you think we need to start a daily headache preventative Follow up in 4 months or as needed.

## 2021-09-05 DIAGNOSIS — H25813 Combined forms of age-related cataract, bilateral: Secondary | ICD-10-CM | POA: Diagnosis not present

## 2021-09-05 DIAGNOSIS — H524 Presbyopia: Secondary | ICD-10-CM | POA: Diagnosis not present

## 2021-09-05 DIAGNOSIS — H40013 Open angle with borderline findings, low risk, bilateral: Secondary | ICD-10-CM | POA: Diagnosis not present

## 2021-09-06 ENCOUNTER — Telehealth: Payer: Self-pay | Admitting: Internal Medicine

## 2021-09-06 NOTE — Telephone Encounter (Signed)
Called patient regarding upcoming 2024 appointments, patient is notified.

## 2021-09-12 ENCOUNTER — Other Ambulatory Visit: Payer: Self-pay | Admitting: Neurosurgery

## 2021-09-12 DIAGNOSIS — D1809 Hemangioma of other sites: Secondary | ICD-10-CM

## 2021-09-14 ENCOUNTER — Ambulatory Visit
Admission: RE | Admit: 2021-09-14 | Discharge: 2021-09-14 | Disposition: A | Discharge status: Home / Self Care | Payer: Medicare Other | Source: Ambulatory Visit | Attending: Neurology | Admitting: Neurology

## 2021-09-14 ENCOUNTER — Other Ambulatory Visit: Payer: Medicare Other

## 2021-09-14 DIAGNOSIS — Z85118 Personal history of other malignant neoplasm of bronchus and lung: Secondary | ICD-10-CM | POA: Diagnosis not present

## 2021-09-14 DIAGNOSIS — Z85850 Personal history of malignant neoplasm of thyroid: Secondary | ICD-10-CM | POA: Diagnosis not present

## 2021-09-14 DIAGNOSIS — R519 Headache, unspecified: Secondary | ICD-10-CM | POA: Diagnosis not present

## 2021-09-14 DIAGNOSIS — J3489 Other specified disorders of nose and nasal sinuses: Secondary | ICD-10-CM | POA: Diagnosis not present

## 2021-09-14 MED ORDER — GADOBENATE DIMEGLUMINE 529 MG/ML IV SOLN
15.0000 mL | Freq: Once | INTRAVENOUS | Status: AC | PRN
Start: 2021-09-14 — End: 2021-09-14
  Administered 2021-09-14: 15 mL via INTRAVENOUS

## 2021-09-19 ENCOUNTER — Telehealth: Payer: Self-pay

## 2021-09-19 ENCOUNTER — Telehealth: Payer: Self-pay | Admitting: Neurology

## 2021-09-19 DIAGNOSIS — R9089 Other abnormal findings on diagnostic imaging of central nervous system: Secondary | ICD-10-CM

## 2021-09-19 NOTE — Telephone Encounter (Signed)
Pt called in to get his MRI results

## 2021-09-19 NOTE — Telephone Encounter (Signed)
Spoke to patient of MRI Brain W/WO Contrast.

## 2021-09-19 NOTE — Telephone Encounter (Signed)
-----   Message from Pieter Partridge, DO sent at 09/15/2021  3:34 PM EDT ----- MRI overall looks stable compared to 3 years ago.  There is small area of enhancement which is believed to be benign but recommend repeat MRI of brain with and without contrast in 6 months just to follow up.

## 2021-09-25 ENCOUNTER — Ambulatory Visit
Admission: RE | Admit: 2021-09-25 | Discharge: 2021-09-25 | Disposition: A | Discharge status: Home / Self Care | Payer: Medicare Other | Source: Ambulatory Visit | Attending: Neurosurgery | Admitting: Neurosurgery

## 2021-09-25 DIAGNOSIS — D1809 Hemangioma of other sites: Secondary | ICD-10-CM

## 2021-09-25 DIAGNOSIS — M5137 Other intervertebral disc degeneration, lumbosacral region: Secondary | ICD-10-CM | POA: Diagnosis not present

## 2021-09-25 DIAGNOSIS — M5127 Other intervertebral disc displacement, lumbosacral region: Secondary | ICD-10-CM | POA: Diagnosis not present

## 2021-09-25 MED ORDER — GADOBENATE DIMEGLUMINE 529 MG/ML IV SOLN
15.0000 mL | Freq: Once | INTRAVENOUS | Status: AC | PRN
  Administered 2021-09-25: 15 mL via INTRAVENOUS

## 2021-09-27 ENCOUNTER — Other Ambulatory Visit: Payer: Self-pay | Admitting: Radiation Therapy

## 2021-09-27 DIAGNOSIS — D1809 Hemangioma of other sites: Secondary | ICD-10-CM | POA: Diagnosis not present

## 2021-10-02 ENCOUNTER — Inpatient Hospital Stay: Payer: Medicare Other | Attending: Neurosurgery

## 2021-10-02 ENCOUNTER — Other Ambulatory Visit: Payer: Self-pay | Admitting: Radiation Therapy

## 2021-10-02 DIAGNOSIS — D1809 Hemangioma of other sites: Secondary | ICD-10-CM | POA: Diagnosis not present

## 2021-10-02 NOTE — Progress Notes (Signed)
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 BONE, L1, BIOPSY:  - Vascular lesion consistent with clinically suspected hemangioma.  - Negative for malignancy.  - See comment.   COMMENT:  Immunohistochemistry for CD34 highlights the vascular lesion.  Cytokeratin AE1/AE3 and TTF-1 are negative.   GROSS DESCRIPTION:  Received in formalin are 2 x 2 x 0.5 cm of soft dark red clotted blood  and fragments of tan bone measuring 0.4 x 0.2 x 0.2 cm in aggregate.  The specimen is entirely submitted in 3 cassettes.  1, 2 = blood clot  3 = decalcified (Immunocal) bone (GRP 05/02/2021)   Stage IA (T1c, N0, M0) non-small cell lung cancer, adenocarcinoma with no actionable mutation and negative PDL 1 expression diagnosed in June 2020.    Past/Anticipated chemotherapy by medical oncology, if any: ***  Pain on a scale of 0-10 is: {Number; 9-40  not applicable:20727}    If Spine Met(s), symptoms, if any, include: Bowel/Bladder retention or incontinence (please describe): *** Numbness or weakness in extremities (please describe): *** Current Decadron regimen, if applicable: ***  Ambulatory status? Walker? Wheelchair?: {VQI Ambulatory Status:20974}  SAFETY ISSUES: Prior radiation? {:18581} Pacemaker/ICD? {:18581} Possible current pregnancy? {:18581} Is the patient on methotrexate? {:18581}  Current Complaints / other details:  ***

## 2021-10-02 NOTE — Progress Notes (Signed)
Radiation Oncology         [phone] ________________________________  Initial Outpatient Consultation  Name: Miguel Wolfe MRN: [id number]  Date: 10/03/2021  DOB: 07-10-1945  PJ:ASNKNL, Shanon Brow, MD  Consuella Lose, MD   REFERRING PHYSICIAN: Consuella Lose, MD  DIAGNOSIS:    ICD-10-CM   1. 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 Hemangioma of bone  D18.09      Hemangioma of L1 vertebra    Cancer Staging  Lung cancer, left upper lobe [facility]) Staging form: Lung, AJCC 8th Edition - Pathologic stage from 06/23/2018: Stage IA3 (pT1c, pN0, cM0) - Signed by Grace Isaac, MD on 06/25/2018 Histopathologic type: Adenocarcinoma, NOS Stage prefix: Initial diagnosis Type of lung cancer: Surgically resected non-small cell lung cancer Perineural invasion (PNI): Absent - Clinical: No stage assigned - Unsigned  CHIEF COMPLAINT: Here to discuss management of hemangioma of the spine  HISTORY OF PRESENT ILLNESS::Miguel Wolfe is a 76 y.o. male who presents today for consideration of radiation therapy in management of his diagnosed hemangioma of the spine. The patient has a medical history significant for left lung cancer s/p wedge resection in June 2020, and thyroid cancer s/p thyroidectomy. He did not undergo adjuvant treatment for either. The patient was followed by Dr. Julien Nordmann after LUL resection. His only complaint/symptom since that time have been chronic back pain secondary to his L1 hemangioma.    The patient presented for a restaging chest CT performed on 02/02/2020 which first demonstrated enlargement of an indeterminate lucent lesion with sclerotic margins posteriorly in the L1 vertebral body, raising potential concern for metastatic disease at the time. CT otherwise showed no evidence of metastatic disease or local recurrence of left lung cancer.   MRI of the lumbar spine on 02/13/2020 showed the L1 lesion with features most concerning for atypical  hemangioma, measuring 2.4 cm.   Dr. Julien Nordmann reviewed these findings with the patient and recommended that he continue on observation with repeat imaging in 6 months.   Chest CT with contrast on 08/22/20 showed interval progression of the lucent L1 vertebral body lesion with possible disruption of the posterior vertebral body cortex. Given continued progression, metastatic disease remained a concern. CT also showed stable appearance of the right paraspinal soft tissue nodule at the level of L1. MRI of the lumbar spine on 09/06/20 also showed enlargement of the enhancing lesion within the L1 vertebral body since February. Diameter of the lesion was appreciated to have increased from 24-34 mm, with indistinct margins noted. Suspicion of early extension into the ventral epidural space was also noted.   Restaging PET scan on 09/26/20 showed low level activity of the L1 vertebral lesion favoring aggressive hemangioma over osseous metastasis. The right paravertebral lesion was also noted to be similar when compared to imaging in 2019 and with low-level activity, favoring a benign lesion such as a nerve sheath or other neurogenic tumor. Otherwise, PET showed no evidence of metastatic disease.   The patient was then referred to Dr. Kathyrn Sheriff, Neurology, on 10/05/20 for further evaluation. During this visit, the patient endorsed intermittent back pain which was manageable with OTC tylenol. Ultimately, the patient was advised to remain on observation.   Follow up chest CT on 03/01/21 showed similar appearance of the L1 vertebral body lesion, again favoring hemangioma over osseous metastasis. The right paravertebral/retrocrural lesion also appeared stable, favoring neurogenic tumor.  CT also showed no evidence of recurrent disease in the left lung, or evidence  of metastatic disease.   MRI of the lumbar spine on 04/11/21 showed continued interval enlargement of an enhancing bone lesion within the L1 vertebral body,  measuring 3.6 x 3.4 cm, previously 2.8 x 2.7 cm on imaging from 09/06/2020. Bulging of the posterior wall of the vertebral body was also appreciated, with presumed extension into the ventral epidural space. 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    Given interval enlargement, the patient underwent biopsy of the L1 lesion on 05/02/21 under Dr. Kathyrn Sheriff. Pathology revealed findings consistent with clinically suspected hemangioma. Following biopsy, the patient established care with OP physical therapy for his lower back pain.   Recently, the patient presented for a follow up MRI of the lumbar spine with and without contrast on 09/25/21 which showed further enlargement of the L1 lesion with osseous retropulsion and new pathologic fracture. MRI otherwise showed the enhancing right paraspinal lesion at L1 as unchanged. No other suspicious osseous lesions were appreciated.    Given progressive enlargement of the course of over 1 year, as well as development of pathologic fracture, Dr. Kathyrn Sheriff advised the patient to proceed with operative stabilization followed by radiation treatment to the L1 hemangioma. Dr. Kathyrn Sheriff also presented the patient's case at the neuro-oncology conference on 10/02/21. Disposition concurred with Dr. Cleotilde Neer recommendation above. (Plan is for operative open reduction and internal fixation of of the L1 pathologic fracture. Dr. Kathyrn Sheriff recommends RT a few weeks after surgery to allow the wound sufficient time to heal).   He denies pain in his upper lumbar spine.  He has pain in the lower lumbar spine, around the L5, which he has been told is likely due to arthritic changes  PREVIOUS RADIATION THERAPY: No  PAST MEDICAL HISTORY:  has a past medical history of Aortic insufficiency, Arthritis, CAD (coronary artery  disease), Colon polyps, Family history of adverse reaction to anesthesia, Family history of colon cancer, Family history of stomach cancer, Fatigue, GERD (gastroesophageal reflux disease), Hemorrhoids, History of echocardiogram, Hyperlipidemia, Hypertension, Lung cancer ([place]), NSCL CA (06/2018), PONV (postoperative nausea and vomiting), Pre-diabetes, Pulmonary nodules, and Thyroid cancer ([place]).    PAST SURGICAL HISTORY: Past Surgical History:  Procedure Laterality Date   CARDIAC CATHETERIZATION     CAROTID ENDARTERECTOMY     COLONOSCOPY WITH ESOPHAGOGASTRODUODENOSCOPY (EGD)  2008   CORONARY STENT INTERVENTION N/A 02/20/2016   Procedure: Coronary Stent Intervention;  Surgeon: Burnell Blanks, MD;  Location: [place] [facility];  Service: Cardiovascular;  Laterality: N/A;   ESOPHAGOGASTRODUODENOSCOPY     LEFT HEART CATH AND CORONARY ANGIOGRAPHY N/A 02/20/2016   Procedure: Left Heart Cath and Coronary Angiography;  Surgeon: Burnell Blanks, MD;  Location: [place] [facility];  Service: Cardiovascular;  Laterality: N/A;   PILONIDAL CYCT     REFRACTIVE SURGERY     retina tear repair   THYROIDECTOMY N/A 01/17/2018   Procedure: TOTAL THYROIDECTOMY;  Surgeon: Armandina Gemma, MD;  Location: [facility];  Service: General;  Laterality: N/A;   VIDEO ASSISTED THORACOSCOPY (VATS)/WEDGE RESECTION Left 06/24/2018   Procedure: VIDEO ASSISTED THORACOSCOPY (VATS)/LUNG RESECTION;  Surgeon: Grace Isaac, MD;  Location: [facility];  Service: Thoracic;  Laterality: Left;   VIDEO BRONCHOSCOPY WITH ENDOBRONCHIAL NAVIGATION N/A 11/18/2017   Procedure: VIDEO BRONCHOSCOPY WITH ENDOBRONCHIAL NAVIGATION WITH BIOPSIES OF LEFT AND RIGHT UPPER LOBES;  Surgeon: Grace Isaac, MD;  Location:  [facility];  Service: Thoracic;  Laterality: N/A;   VIDEO BRONCHOSCOPY WITH ENDOBRONCHIAL NAVIGATION N/A 06/11/2018   Procedure: VIDEO BRONCHOSCOPY WITH ENDOBRONCHIAL NAVIGATION;  Surgeon: Grace Isaac, MD;  Location: [facility];   Service: Thoracic;  Laterality: N/A;    FAMILY HISTORY: family history includes Cancer in his paternal uncle and paternal uncle; Colon cancer (age of onset: 37) in his cousin; Crohn's disease in his sister; Healthy in his sister; Heart attack (age of onset: 82) in his father; Parkinsonism in his sister; Stomach cancer in his paternal uncle; Throat cancer in his paternal uncle; Thyroid cancer in his daughter.  SOCIAL HISTORY:  reports that he quit smoking about 54 years ago. His smoking use included cigarettes. He has a 3.00 pack-year smoking history. He has never used smokeless tobacco. He reports current alcohol use. He reports that he does not use drugs.  ALLERGIES: Gemfibrozil, Other, Propranolol, Niaspan [niacin er], and Simvastatin  MEDICATIONS:  Current Outpatient Medications  Medication Sig Dispense Refill   acetaminophen (TYLENOL) 325 MG tablet Take 650 mg by mouth every 6 (six) hours as needed for moderate pain or headache.      amLODipine (NORVASC) 10 MG tablet Take 10 mg by mouth daily.     aspirin EC 81 MG tablet Take 81 mg by mouth daily.     carvedilol (COREG) 6.25 MG tablet Take 1 tablet (6.25 mg total) by mouth 2 (two) times daily with a meal. 180 tablet 3   ezetimibe (ZETIA) 10 MG tablet Take 1 tablet (10 mg total) by mouth daily. 90 tablet 3   famotidine (PEPCID) 20 MG tablet Take 20 mg by mouth daily.     ibuprofen (ADVIL,MOTRIN) 200 MG tablet Take 400 mg by mouth every 8 (eight) hours as needed for headache or moderate pain.      levothyroxine (SYNTHROID) 150 MCG tablet TAKE 1 TABLET BY MOUTH EVERY DAY IN THE MORNING ON AN EMPTY STOMACH     losartan (COZAAR) 100 MG tablet Take 100 mg by mouth daily.     Omega-3 Fatty Acids (FISH OIL) 1000 MG CAPS Take 2 capsules by mouth in the morning and at bedtime.     pantoprazole (PROTONIX) 40 MG tablet Take 40 mg by mouth every other day.     pravastatin (PRAVACHOL) 80 MG tablet TAKE 1 TABLET(80 MG) BY MOUTH DAILY 30 tablet 6    zolpidem (AMBIEN) 10 MG tablet Take 10 mg by mouth at bedtime as needed for sleep.   0   LORazepam (ATIVAN) 0.5 MG tablet Take 1 tablet 30 minutes prior to radiation procedures or MRIs. 8 tablet 0   nitroGLYCERIN (NITROSTAT) 0.4 MG SL tablet PLACE 1 TABLET UNDER THE TONGUE EVERY 5 MINUTES AS NEEDED FOR CHEST PAIN (Patient not taking: Reported on 10/03/2021) 25 tablet 11   No current facility-administered medications for this encounter.    REVIEW OF SYSTEMS:  Notable for that above.   PHYSICAL EXAM:  weight is 167 lb (75.8 kg). His blood pressure is 122/69 and his pulse is 66. His respiration is 20 and oxygen saturation is 100%.   General: Alert and oriented, in no acute distress  HEENT: Head is normocephalic. Extraocular movements are intact.   Heart: Regular in rate and rhythm with no murmurs, rubs, or gallops. Chest: Clear to auscultation bilaterally, with no rhonchi, wheezes, or rales. Abdomen: Soft, nontender, nondistended, with no rigidity or guarding. Extremities: No cyanosis or edema. Lymphatics: see Neck Exam Skin: No concerning lesions. Musculoskeletal: symmetric strength  and muscle tone throughout. Neurologic: Cranial nerves II through XII are grossly intact. No obvious focalities. Speech is fluent. Coordination is intact.  Sensation is intact in all of his extremities Psychiatric: Judgment and insight are intact. Affect is appropriate.   ECOG = 0  0 - Asymptomatic (Fully active, able to carry on all predisease activities without restriction)  1 - Symptomatic but completely ambulatory (Restricted in physically strenuous activity but ambulatory and able to carry out work of a light or sedentary nature. For example, light housework, office work)  2 - Symptomatic, <50% in bed during the day (Ambulatory and capable of all self care but unable to carry out any work activities. Up and about more than 50% of waking hours)  3 - Symptomatic, >50% in bed, but not bedbound (Capable of  only limited self-care, confined to bed or chair 50% or more of waking hours)  4 - Bedbound (Completely disabled. Cannot carry on any self-care. Totally confined to bed or chair)  5 - Death   Eustace Pen MM, Creech RH, Tormey DC, et al. [phone]). "Toxicity and response criteria of the [facility]". [place] Oncol. 5 (6): 649-55   LABORATORY DATA:  Lab Results  Component Value Date   WBC 5.2 03/01/2021   HGB 15.2 03/01/2021   HCT 44.6 03/01/2021   MCV 88.8 03/01/2021   PLT 152 03/01/2021   CMP     Component Value Date/Time   NA 143 03/01/2021 0845   NA 143 04/24/2016 0935   K 3.6 03/01/2021 0845   CL 105 03/01/2021 0845   CO2 32 03/01/2021 0845   GLUCOSE 114 (H) 03/01/2021 0845   BUN 14 03/01/2021 0845   BUN 17 04/24/2016 0935   CREATININE 0.74 03/01/2021 0845   CALCIUM 8.7 (L) 03/01/2021 0845   PROT 6.8 03/13/2021 0753   ALBUMIN 5.0 (H) 03/13/2021 0753   AST 24 03/13/2021 0753   AST 21 03/01/2021 0845   ALT 18 03/13/2021 0753   ALT 18 03/01/2021 0845   ALKPHOS 77 03/13/2021 0753   BILITOT 0.9 03/13/2021 0753   BILITOT 0.8 03/01/2021 0845   GFRNONAA >60 03/01/2021 0845   GFRAA >60 08/03/2019 1301         RADIOGRAPHY: MR Lumbar Spine W Wo Contrast  Result Date: 09/26/2021 CLINICAL DATA:  Low back pain for 6 months. History of lung cancer. No previous back surgery. L1 biopsy performed 05/02/2021 demonstrated no evidence of malignancy and findings supporting a hemangioma. EXAM: MRI LUMBAR SPINE WITHOUT AND WITH CONTRAST TECHNIQUE: Multiplanar and multiecho pulse sequences of the lumbar spine were obtained without and with intravenous contrast. CONTRAST:  56mL MULTIHANCE GADOBENATE DIMEGLUMINE 529 MG/ML IV SOLN COMPARISON:  Lumbar MRI 04/11/2021. Chest CT 03/01/2021. PET-CT 09/26/2020. FINDINGS: Segmentation: Conventional anatomy assumed, with the last open disc space designated L5-S1. Alignment:  Physiologic. Vertebrae: The expansile lesion within the L1  vertebral body demonstrates continued enlargement, measuring 4.3 x 3.9 cm transverse on image 5/5 (previously 3.4 x 3.6 cm). There is approximately 5 mm of osseous retropulsion and a new mild superior endplate compression deformity with approximately 20% loss of vertebral body height. This lesion demonstrates low T1 and high T2 signal and shows homogeneous enhancement following contrast. No new osseous lesions are identified. There are chronic endplate degenerative changes at L5-S1. Mild sacroiliac degenerative changes bilaterally. Conus medullaris: Extends to the L1 level and appears normal. No abnormal intradural enhancement. Paraspinal and other soft tissues: The heterogeneously enhancing right paraspinal lesion at the  L1 level is unchanged, measuring up to 2.4 cm on image 5/5. No new or enlarging paraspinal or epidural masses are identified. Disc levels: Sagittal images demonstrate mild disc degeneration at T11-12 without resulting spinal stenosis or nerve root encroachment. T12-L1: No spinal stenosis or nerve root encroachment on the basis of disc disease. Progressive osseous retropulsion and mild pathologic fracture of the L1 vertebral body result in mild mass effect on the thecal sac. The foramina are patent. L1-2: Mild disc bulging.  No spinal stenosis. L2-3: Mild disc bulging and facet hypertrophy. No spinal stenosis or nerve root encroachment. L3-4: Disc height and hydration are maintained. Mild facet and ligamentous hypertrophy. No spinal stenosis or nerve root encroachment. L4-5: Mild loss of disc height with annular disc bulging, facet and ligamentous hypertrophy. Mild narrowing of the lateral recesses. The foramina are sufficiently patent. L5-S1: Chronic degenerative disc disease with loss of disc height, disc bulging and endplate osteophytes. Mild facet and ligamentous hypertrophy contributing to mild chronic foraminal narrowing bilaterally. No nerve root encroachment. IMPRESSION: 1. Further  enlargement of the L1 lesion with osseous retropulsion and new pathologic fracture. Overall imaging features on previous studies and the previous biopsy support the diagnosis of an aggressive hemangioma. 2. 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 This was not hypermetabolic on PET-CT. 3. No other suspicious osseous lesions. 4. No change in mild spondylosis. Electronically Signed   By: Richardean Sale M.D.   On: 09/26/2021 19:47   MR BRAIN W WO CONTRAST  Result Date: 09/15/2021 CLINICAL DATA:  Headaches.  History of lung and thyroid cancer. EXAM: MRI HEAD WITHOUT AND WITH CONTRAST TECHNIQUE: Multiplanar, multiecho pulse sequences of the brain and surrounding structures were obtained without and with intravenous contrast. CONTRAST:  71mL MULTIHANCE GADOBENATE DIMEGLUMINE 529 MG/ML IV SOLN COMPARISON:  CT head 08/09/2021, MR brain 09/03/2018 FINDINGS: Brain: There is no acute intracranial hemorrhage, extra-axial fluid collection, or acute infarct. Parenchymal volume is normal. The ventricles are normal in size. Gray-white differentiation is preserved. There is patchy and confluent FLAIR signal abnormality in the supratentorial white matter which is nonspecific but likely reflects sequela of mild-to-moderate chronic small vessel ischemic change, not significantly changed since 2020 There is a 1.1 cm focus of extra-axial FLAIR signal abnormality overlying the left paramedian frontal lobe along the falx with small foci of enhancement and faint diffusion restriction, not significantly changed in size since 2020; however, the small enhancing foci and diffusion restriction are new since that study. There is no other abnormal enhancement. There is no mass effect or midline shift. Vascular: Normal flow voids. Skull and upper cervical spine: Normal marrow signal. Sinuses/Orbits: There is mild mucosal thickening in the paranasal sinuses. The globes and orbits are  unremarkable Other: None. IMPRESSION: 1. 1.1 cm focus of extra-axial FLAIR signal abnormality along the left aspect of the falx near the vertex is unchanged in size since 2020; however, small foci of enhancement within the lesion are new since that study. Given stable size over 3 years, this finding is still favored to be benign. Consider follow-up brain MRI with contrast in 6-12 months to ensure continued stable size and no progressive enhancement. 2. No other abnormal enhancement or evidence of intracranial metastatic disease. 3. No acute intracranial pathology. Electronically Signed   By: Valetta Mole M.D.   On: 09/15/2021 14:50      IMPRESSION/PLAN: This is a delightful 76 year old gentleman with a large progressive hemangioma at the L1 vertebral body.  It has  been biopsied and evaluated by Dr. Kathyrn Sheriff of neurosurgery.  I personally reviewed his images today as well as yesterday, when he was presented at our CNS tumor board.  I discussed the patient in detail with Dr. Kathyrn Sheriff as well as with multiple other subspecialists on our CNS oncology team.  Consensus was that the patient should be strongly considered for radiation therapy given that he is not a satisfactory candidate for surgical resection.  Unfortunately this tumor is extremely high risk for bleeding at surgical resection.  Dr. Kathyrn Sheriff is concerned that the patient could experience blood loss, several liters, and he does not recommend surgery upfront.  I discussed with the patient that there is literature documenting that hemangiomas can respond to radiation therapy.  Various dose fractionation schemes have been documented in relatively small studies.  Dr. Kathyrn Sheriff and I agree that radiosurgery is more promising to achieve local control than standard fractionation radiation therapy.  I consulted with some colleagues at different institutions, including an academic center, regarding this unusual case, and they are in agreement with my  recommendation for fractionated radiosurgery  The plan is that the patient will undergo surgical stabilization as he is at high risk for vertebral compression fracture.  After he is healed adequately he will undergo treatment planning MRI and treatment planning CT simulation for radiosurgery.  Anticipate fractionated radiosurgery, 25-30Gy, over 5 treatments.    The patient understands there are no guarantees to treatment.  He understands that the goal of treatment is curative, to stop the progression of his tumor entirely.  He understands that the hemangioma will not disappear but will more likely scar down.  The goal is to protect his spine from further damage and protect his spinal cord.  Potential side effects of radiosurgery include but are not necessarily limited to skin irritation, fatigue, serious bleeding, GI upset, injury to spinal cord, injury to bone, injury to bowel, injury to stomach, injury to kidneys, injury to normal tissues in the abdomen.  A consent form was signed and placed in his chart.  Patient and his wife had many good questions which I answered to the best of my ability.  I look forward to participating in his care.  We will continue to coordinate with Dr. Kathyrn Sheriff.  On date of service, in total, I spent 60 minutes on this encounter. Patient was seen in person.   __________________________________________   Eppie Gibson, MD  This document serves as a record of services personally performed by Eppie Gibson, MD. It was created on her behalf by Roney Mans, a trained medical scribe. The creation of this record is based on the scribe's personal observations and the provider's statements to them. This document has been checked and approved by the attending provider.

## 2021-10-03 ENCOUNTER — Encounter: Payer: Self-pay | Admitting: Radiation Oncology

## 2021-10-03 ENCOUNTER — Ambulatory Visit
Admission: RE | Admit: 2021-10-03 | Discharge: 2021-10-03 | Disposition: A | Discharge status: Home / Self Care | Payer: Medicare Other | Source: Ambulatory Visit | Attending: Radiation Oncology | Admitting: Radiation Oncology

## 2021-10-03 ENCOUNTER — Other Ambulatory Visit: Payer: Self-pay | Admitting: Neurosurgery

## 2021-10-03 ENCOUNTER — Other Ambulatory Visit: Payer: Self-pay

## 2021-10-03 VITALS — BP 122/69 | HR 66 | Resp 20 | Wt 167.0 lb

## 2021-10-03 DIAGNOSIS — M129 Arthropathy, unspecified: Secondary | ICD-10-CM | POA: Diagnosis not present

## 2021-10-03 DIAGNOSIS — D1809 Hemangioma of other sites: Secondary | ICD-10-CM | POA: Insufficient documentation

## 2021-10-03 DIAGNOSIS — K219 Gastro-esophageal reflux disease without esophagitis: Secondary | ICD-10-CM | POA: Insufficient documentation

## 2021-10-03 DIAGNOSIS — E785 Hyperlipidemia, unspecified: Secondary | ICD-10-CM | POA: Insufficient documentation

## 2021-10-03 DIAGNOSIS — Z85850 Personal history of malignant neoplasm of thyroid: Secondary | ICD-10-CM | POA: Diagnosis not present

## 2021-10-03 DIAGNOSIS — Z85118 Personal history of other malignant neoplasm of bronchus and lung: Secondary | ICD-10-CM | POA: Diagnosis not present

## 2021-10-03 DIAGNOSIS — I2510 Atherosclerotic heart disease of native coronary artery without angina pectoris: Secondary | ICD-10-CM | POA: Insufficient documentation

## 2021-10-03 DIAGNOSIS — I10 Essential (primary) hypertension: Secondary | ICD-10-CM | POA: Diagnosis not present

## 2021-10-03 DIAGNOSIS — Z87891 Personal history of nicotine dependence: Secondary | ICD-10-CM | POA: Diagnosis not present

## 2021-10-03 MED ORDER — LORAZEPAM 0.5 MG PO TABS: ORAL_TABLET | ORAL | 0 refills | Status: DC

## 2021-10-04 ENCOUNTER — Other Ambulatory Visit: Payer: Self-pay | Admitting: Radiation Therapy

## 2021-10-10 DIAGNOSIS — Z23 Encounter for immunization: Secondary | ICD-10-CM | POA: Diagnosis not present

## 2021-10-16 ENCOUNTER — Other Ambulatory Visit: Payer: Self-pay | Admitting: Cardiology

## 2021-10-16 DIAGNOSIS — E7800 Pure hypercholesterolemia, unspecified: Secondary | ICD-10-CM

## 2021-10-17 ENCOUNTER — Other Ambulatory Visit: Payer: Self-pay | Admitting: Radiation Therapy

## 2021-10-17 DIAGNOSIS — D1809 Hemangioma of other sites: Secondary | ICD-10-CM

## 2021-10-19 DIAGNOSIS — Z23 Encounter for immunization: Secondary | ICD-10-CM | POA: Diagnosis not present

## 2021-11-03 NOTE — Pre-Procedure Instructions (Signed)
Surgical Instructions    Your procedure is scheduled on Tuesday, November 7th.  Report to [facility] Main Entrance "A" at 05:30 A.M., then check in with the Admitting office.  Call this number if you have problems the morning of surgery:  [phone]   If you have any questions prior to your surgery date call [phone]: Open Monday-Friday 8am-4pm    Remember:  Do not eat or drink after midnight the night before your surgery     Take these medicines the morning of surgery with A SIP OF WATER  amLODipine (NORVASC)  carvedilol (COREG)  ezetimibe (ZETIA)  famotidine (PEPCID)  levothyroxine (SYNTHROID)  pantoprazole (PROTONIX) pravastatin (PRAVACHOL)   If needed: acetaminophen (TYLENOL) fluticasone (FLONASE) LORazepam (ATIVAN) nitroGLYCERIN (NITROSTAT)   Follow your surgeon's instructions on when to stop Aspirin.  If no instructions were given by your surgeon then you will need to call the office to get those instructions.     As of today, STOP taking any Aleve, Naproxen, Ibuprofen, Motrin, Advil, Goody's, BC's, all herbal medications, fish oil, and all vitamins.                     Do NOT Smoke (Tobacco/Vaping) for 24 hours prior to your procedure.  If you use a CPAP at night, you may bring your mask/headgear for your overnight stay.   Contacts, glasses, piercing's, hearing aid's, dentures or partials may not be worn into surgery, please bring cases for these belongings.    For patients admitted to the hospital, discharge time will be determined by your treatment team.   Patients discharged the day of surgery will not be allowed to drive home, and someone needs to stay with them for 24 hours.  SURGICAL WAITING ROOM VISITATION Patients having surgery or a procedure may have no more than 2 support people in the waiting area - these visitors may rotate.   Children under the age of 29 must have an adult with them who is not the patient. If the patient needs to stay at the  hospital during part of their recovery, the visitor guidelines for inpatient rooms apply. Pre-op nurse will coordinate an appropriate time for 1 support person to accompany patient in pre-op.  This support person may not rotate.   Please refer to the [facility] website for the visitor guidelines for Inpatients (after your surgery is over and you are in a regular room).    Special instructions:   [place]- Preparing For Surgery  Before surgery, you can play an important role. Because skin is not sterile, your skin needs to be as free of germs as possible. You can reduce the number of germs on your skin by washing with CHG (chlorahexidine gluconate) Soap before surgery.  CHG is an antiseptic cleaner which kills germs and bonds with the skin to continue killing germs even after washing.    Oral Hygiene is also important to reduce your risk of infection.  Remember - BRUSH YOUR TEETH THE MORNING OF SURGERY WITH YOUR REGULAR TOOTHPASTE  Please do not use if you have an allergy to CHG or antibacterial soaps. If your skin becomes reddened/irritated stop using the CHG.  Do not shave (including legs and underarms) for at least 48 hours prior to first CHG shower. It is OK to shave your face.  Please follow these instructions carefully.   Shower the NIGHT BEFORE SURGERY and the MORNING OF SURGERY  If you chose to wash your hair, wash your hair first as  usual with your normal shampoo.  After you shampoo, rinse your hair and body thoroughly to remove the shampoo.  Use CHG Soap as you would any other liquid soap. You can apply CHG directly to the skin and wash gently with a scrungie or a clean washcloth.   Apply the CHG Soap to your body ONLY FROM THE NECK DOWN.  Do not use on open wounds or open sores. Avoid contact with your eyes, ears, mouth and genitals (private parts). Wash Face and genitals (private parts)  with your normal soap.   Wash thoroughly, paying special attention to the area where  your surgery will be performed.  Thoroughly rinse your body with warm water from the neck down.  DO NOT shower/wash with your normal soap after using and rinsing off the CHG Soap.  Pat yourself dry with a CLEAN TOWEL.  Wear CLEAN PAJAMAS to bed the night before surgery  Place CLEAN SHEETS on your bed the night before your surgery  DO NOT SLEEP WITH PETS.   Day of Surgery: Take a shower with CHG soap. Do not wear jewelry or makeup Do not wear lotions, powders, colognes, or deodorant. Men may shave face and neck. Do not bring valuables to the hospital. [facility] is not responsible for any belongings or valuables.  Wear Clean/Comfortable clothing the morning of surgery Remember to brush your teeth WITH YOUR REGULAR TOOTHPASTE.   Please read over the following fact sheets that you were given.    If you received a COVID test during your pre-op visit  it is requested that you wear a mask when out in public, stay away from anyone that may not be feeling well and notify your surgeon if you develop symptoms. If you have been in contact with anyone that has tested positive in the last 10 days please notify you surgeon.

## 2021-11-06 ENCOUNTER — Encounter (HOSPITAL_COMMUNITY)
Admission: RE | Admit: 2021-11-06 | Discharge: 2021-11-06 | Disposition: A | Discharge status: Home / Self Care | Payer: Medicare Other | Source: Ambulatory Visit | Attending: Neurosurgery | Admitting: Neurosurgery

## 2021-11-06 ENCOUNTER — Encounter (HOSPITAL_COMMUNITY): Payer: Self-pay

## 2021-11-06 ENCOUNTER — Other Ambulatory Visit: Payer: Self-pay

## 2021-11-06 VITALS — BP 136/83 | HR 73 | Temp 97.9°F | Resp 18 | Ht 71.0 in | Wt 165.6 lb

## 2021-11-06 DIAGNOSIS — Z01818 Encounter for other preprocedural examination: Secondary | ICD-10-CM | POA: Diagnosis not present

## 2021-11-06 DIAGNOSIS — I2510 Atherosclerotic heart disease of native coronary artery without angina pectoris: Secondary | ICD-10-CM | POA: Diagnosis not present

## 2021-11-06 HISTORY — DX: Anxiety disorder, unspecified: F41.9

## 2021-11-06 LAB — BASIC METABOLIC PANEL
Anion gap: 9 (ref 5–15)
BUN: 11 mg/dL (ref 8–23)
CO2: 28 mmol/L (ref 22–32)
Calcium: 9.4 mg/dL (ref 8.9–10.3)
Chloride: 105 mmol/L (ref 98–111)
Creatinine, Ser: 0.8 mg/dL (ref 0.61–1.24)
GFR, Estimated: >60 mL/min (ref >60)
Glucose, Bld: 124 mg/dL — ABNORMAL HIGH (ref 70–99)
Potassium: 3.8 mmol/L (ref 3.5–5.1)
Sodium: 142 mmol/L (ref 135–145)

## 2021-11-06 LAB — TYPE AND SCREEN
ABO/RH(D): A POS
Antibody Screen: NEGATIVE

## 2021-11-06 LAB — CBC
HCT: 44.9 % (ref 39.0–52.0)
Hemoglobin: 15.2 g/dL (ref 13.0–17.0)
MCH: 30.3 pg (ref 26.0–34.0)
MCHC: 33.9 g/dL (ref 30.0–36.0)
MCV: 89.6 fL (ref 80.0–100.0)
Platelets: 156 10*3/uL (ref 150–400)
RBC: 5.01 MIL/uL (ref 4.22–5.81)
RDW: 12.2 % (ref 11.5–15.5)
WBC: 5.3 10*3/uL (ref 4.0–10.5)
nRBC: 0 % (ref 0.0–0.2)

## 2021-11-06 LAB — SURGICAL PCR SCREEN
MRSA, PCR: NEGATIVE
Staphylococcus aureus: NEGATIVE

## 2021-11-06 NOTE — Progress Notes (Signed)
PCP - Dr. [name] Cardiologist - Dr. Candee Furbish  PPM/ICD - denies   Chest x-ray - 01/26/21 EKG - 01/25/21 Stress Test - 02/16/21 ECHO - 05/16/21 Cardiac Cath - 02/20/2016  Sleep Study - denies   DM- denies  Last dose of GLP1 agonist-  n/a   Blood Thinner Instructions: n/a Aspirin Instructions:  Hold 7 days. Last dose 10/30  ERAS Protcol - no, NPO   COVID TEST- n/a   Anesthesia review: yes, cardiac hx  Patient denies shortness of breath, fever, cough and chest pain at PAT appointment   All instructions explained to the patient, with a verbal understanding of the material. Patient agrees to go over the instructions while at home for a better understanding. The opportunity to ask questions was provided.

## 2021-11-07 NOTE — Anesthesia Preprocedure Evaluation (Addendum)
Anesthesia Evaluation  Patient identified by MRN, date of birth, ID band Patient awake    Reviewed: Allergy & Precautions, NPO status , Patient's Chart, lab work & pertinent test results  History of Anesthesia Complications (+) PONV and history of anesthetic complications  Airway Mallampati: IV  TM Distance: <3 FB Neck ROM: Full    Dental  (+) Dental Advisory Given, Teeth Intact,    Pulmonary former smoker   breath sounds clear to auscultation       Cardiovascular hypertension, Pt. on medications + CAD and + Cardiac Stents   Rhythm:Regular  1. Left ventricular ejection fraction, by estimation, is 60 to 65%. The  left ventricle has normal function. The left ventricle has no regional  wall motion abnormalities. The left ventricular internal cavity size was  mildly dilated. Left ventricular  diastolic parameters are consistent with Grade I diastolic dysfunction  (impaired relaxation).   2. Right ventricular systolic function is normal. The right ventricular  size is normal.   3. The mitral valve is normal in structure. No evidence of mitral valve  regurgitation. No evidence of mitral stenosis.   4. The aortic valve is tricuspid. Aortic valve regurgitation is mild to  moderate. Aortic valve sclerosis is present, with no evidence of aortic  valve stenosis.   5. Aortic dilatation noted. There is mild dilatation of the aortic root,  measuring 41 mm. There is mild dilatation of the ascending aorta,  measuring 41 mm.   6. The inferior vena cava is normal in size with greater than 50%  respiratory variability, suggesting right atrial pressure of 3 mmHg.     The study is normal. The study is low risk.   No ST deviation was noted.   Left ventricular function is normal. Nuclear stress EF: 59 %. The left ventricular ejection fraction is normal (55-65%). End diastolic cavity size is normal.   Prior study available for comparison from  10/02/2017. 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    GI/Hepatic Neg liver ROS,GERD  ,,  Endo/Other  negative endocrine ROS    Renal/GU negative Renal ROS     Musculoskeletal  (+) Arthritis ,    Abdominal   Peds  Hematology negative hematology ROS (+) Lab Results      Component                Value               Date                      WBC                      5.3                 11/06/2021                HGB                      15.2                11/06/2021                HCT  44.9                11/06/2021                MCV                      89.6                11/06/2021                PLT                      156                 11/06/2021              Anesthesia Other Findings   Reproductive/Obstetrics                             Anesthesia Physical Anesthesia Plan  ASA: 3  Anesthesia Plan: General   Post-op Pain Management: Ofirmev IV (intra-op)* and Toradol IV (intra-op)*   Induction: Intravenous  PONV Risk Score and Plan: 3 and Ondansetron and Dexamethasone  Airway Management Planned: Oral ETT and Video Laryngoscope Planned  Additional Equipment: None  Intra-op Plan:   Post-operative Plan: Extubation in OR  Informed Consent: I have reviewed the patients History and Physical, chart, labs and discussed the procedure including the risks, benefits and alternatives for the proposed anesthesia with the patient or authorized representative who has indicated his/her understanding and acceptance.     Dental advisory given  Plan Discussed with: CRNA  Anesthesia Plan Comments: (PAT note by Karoline Caldwell, PA-C: Follows with cardiology for hx of CAD s/p DES to RCA 2018.  Nuclear stress 02/2021 was low risk.  Echo 05/2021 showed EF 60 to 65%, grade 1 DD, no significant valvular abnormalities.   Last seen by Dr. Marlou Porch 08/07/2021.  Per note, doing well at that time, no anginal symptoms.  No changes made to management.  History of thyroid cancer s/p thyroidectomy and lung cancer s/p left upper lobectomy.  Preop labs reviewed, WNL.    Video Laryngoscopy used due to patient endorse h/o weak left vocal cord  EKG 01/25/2021: Sinus rhythm.  Rate 63.  LAD.  CT Chest 03/01/21: IMPRESSION: 1. Status post left upper lobectomy, without typical findings of recurrent or metastatic disease. 2. Similar right apical mixed attenuation focus, favored to represent scarring. Given low-level activity on prior PET, low-grade adenocarcinoma could look similar. Recommend attention on follow-up. 3. Similar appearance of the L1 vertebral body lesion, favoring hemangioma over osseous metastasis. 4. Similar right paravertebral/retrocrural lesion, again favoring neurogenic tumor. 5. Coronary artery atherosclerosis. Aortic Atherosclerosis (ICD10-I70.0). 6. Apparent proximal gastric wall thickening, possibly due to underdistention. Correlate with any symptoms of gastritis.  TTE 05/16/2021: 1. Left ventricular ejection fraction, by estimation, is 60 to 65%. The  left ventricle has normal function. The left ventricle has no regional  wall motion abnormalities. The left ventricular internal cavity size was  mildly dilated. Left ventricular  diastolic parameters are consistent with Grade I diastolic dysfunction  (impaired relaxation).  2. Right ventricular systolic function is normal. The right ventricular  size is normal.  3. The mitral valve is normal in structure. No evidence of mitral valve  regurgitation. No evidence of mitral stenosis.  4. The aortic valve is tricuspid. Aortic valve regurgitation is mild to  moderate. Aortic valve  sclerosis is present, with no evidence of aortic  valve stenosis.  5. Aortic dilatation noted. There is mild dilatation of the aortic root,  measuring 41 mm. There  is mild dilatation of the ascending aorta,  measuring 41 mm.  6. The inferior vena cava is normal in size with greater than 50%  respiratory variability, suggesting right atrial pressure of 3 mmHg.   Nuclear stress 02/16/2021:  The study is normal. The study is low risk.  No ST deviation was noted.  Left ventricular function is normal. Nuclear stress EF: 59 %. The left ventricular ejection fraction is normal (55-65%). End diastolic cavity size is normal.  Prior study available for comparison from 10/02/2017. No signifcant ischemic defects compared to prior study  )        Anesthesia Quick Evaluation

## 2021-11-07 NOTE — Progress Notes (Signed)
Anesthesia Chart Review:  Follows with cardiology for hx of CAD s/p DES to RCA 2018.  Nuclear stress 02/2021 was low risk.  Echo 05/2021 showed EF 60 to 65%, grade 1 DD, no significant valvular abnormalities.  Last seen by Dr. Marlou Porch 08/07/2021.  Per note, doing well at that time, no anginal symptoms.  No changes made to management.  History of thyroid cancer s/p thyroidectomy and lung cancer s/p left upper lobectomy.  Preop labs reviewed, WNL.  EKG 01/25/2021: Sinus rhythm.  Rate 63.  LAD.  CT Chest 03/01/21: IMPRESSION: 1. Status post left upper lobectomy, without typical findings of recurrent or metastatic disease. 2. Similar right apical mixed attenuation focus, favored to represent scarring. Given low-level activity on prior PET, low-grade adenocarcinoma could look similar. Recommend attention on follow-up. 3. Similar appearance of the L1 vertebral body lesion, favoring hemangioma over osseous metastasis. 4. Similar right paravertebral/retrocrural lesion, again favoring neurogenic tumor. 5. Coronary artery atherosclerosis. Aortic Atherosclerosis (ICD10-I70.0). 6. Apparent proximal gastric wall thickening, possibly due to underdistention. Correlate with any symptoms of gastritis.  TTE 05/16/2021:  1. Left ventricular ejection fraction, by estimation, is 60 to 65%. The  left ventricle has normal function. The left ventricle has no regional  wall motion abnormalities. The left ventricular internal cavity size was  mildly dilated. Left ventricular  diastolic parameters are consistent with Grade I diastolic dysfunction  (impaired relaxation).   2. Right ventricular systolic function is normal. The right ventricular  size is normal.   3. The mitral valve is normal in structure. No evidence of mitral valve  regurgitation. No evidence of mitral stenosis.   4. The aortic valve is tricuspid. Aortic valve regurgitation is mild to  moderate. Aortic valve sclerosis is present, with no evidence  of aortic  valve stenosis.   5. Aortic dilatation noted. There is mild dilatation of the aortic root,  measuring 41 mm. There is mild dilatation of the ascending aorta,  measuring 41 mm.   6. The inferior vena cava is normal in size with greater than 50%  respiratory variability, suggesting right atrial pressure of 3 mmHg.   Nuclear stress 02/16/2021:   The study is normal. The study is low risk.   No ST deviation was noted.   Left ventricular function is normal. Nuclear stress EF: 59 %. The left ventricular ejection fraction is normal (55-65%). End diastolic cavity size is normal.   Prior study available for comparison from 10/02/2017. No signifcant ischemic defects compared to prior study    Wynonia Musty [facility] Short Stay Center/Anesthesiology Phone [phone] 11/07/2021 12:50 PM

## 2021-11-08 ENCOUNTER — Other Ambulatory Visit: Payer: Self-pay | Admitting: Neurosurgery

## 2021-11-14 ENCOUNTER — Inpatient Hospital Stay (HOSPITAL_COMMUNITY): Payer: Medicare Other

## 2021-11-14 ENCOUNTER — Encounter (HOSPITAL_COMMUNITY)
Admission: RE | Disposition: A | Discharge status: Home / Self Care | Payer: Self-pay | Source: Home / Self Care | Attending: Neurosurgery

## 2021-11-14 ENCOUNTER — Other Ambulatory Visit: Payer: Self-pay

## 2021-11-14 ENCOUNTER — Encounter (HOSPITAL_COMMUNITY): Payer: Self-pay | Admitting: Neurosurgery

## 2021-11-14 ENCOUNTER — Inpatient Hospital Stay (HOSPITAL_COMMUNITY)
Admission: RE | Admit: 2021-11-14 | Discharge: 2021-11-15 | DRG: 520 | Disposition: A | Discharge status: Home / Self Care | Payer: Medicare Other | Attending: Neurosurgery | Admitting: Neurosurgery

## 2021-11-14 ENCOUNTER — Inpatient Hospital Stay (HOSPITAL_COMMUNITY): Payer: Medicare Other | Admitting: Physician Assistant

## 2021-11-14 ENCOUNTER — Inpatient Hospital Stay (HOSPITAL_COMMUNITY): Payer: Medicare Other | Admitting: Anesthesiology

## 2021-11-14 DIAGNOSIS — I351 Nonrheumatic aortic (valve) insufficiency: Secondary | ICD-10-CM | POA: Diagnosis present

## 2021-11-14 DIAGNOSIS — M1990 Unspecified osteoarthritis, unspecified site: Secondary | ICD-10-CM | POA: Diagnosis present

## 2021-11-14 DIAGNOSIS — Z86010 Personal history of colonic polyps: Secondary | ICD-10-CM | POA: Diagnosis not present

## 2021-11-14 DIAGNOSIS — F419 Anxiety disorder, unspecified: Secondary | ICD-10-CM | POA: Diagnosis present

## 2021-11-14 DIAGNOSIS — Z85118 Personal history of other malignant neoplasm of bronchus and lung: Secondary | ICD-10-CM

## 2021-11-14 DIAGNOSIS — Z79899 Other long term (current) drug therapy: Secondary | ICD-10-CM | POA: Diagnosis not present

## 2021-11-14 DIAGNOSIS — E890 Postprocedural hypothyroidism: Secondary | ICD-10-CM | POA: Diagnosis present

## 2021-11-14 DIAGNOSIS — Z79890 Hormone replacement therapy (postmenopausal): Secondary | ICD-10-CM | POA: Diagnosis not present

## 2021-11-14 DIAGNOSIS — I10 Essential (primary) hypertension: Secondary | ICD-10-CM | POA: Diagnosis present

## 2021-11-14 DIAGNOSIS — I2510 Atherosclerotic heart disease of native coronary artery without angina pectoris: Secondary | ICD-10-CM | POA: Diagnosis present

## 2021-11-14 DIAGNOSIS — Z888 Allergy status to other drugs, medicaments and biological substances status: Secondary | ICD-10-CM

## 2021-11-14 DIAGNOSIS — Z902 Acquired absence of lung [part of]: Secondary | ICD-10-CM

## 2021-11-14 DIAGNOSIS — E785 Hyperlipidemia, unspecified: Secondary | ICD-10-CM | POA: Diagnosis present

## 2021-11-14 DIAGNOSIS — K219 Gastro-esophageal reflux disease without esophagitis: Secondary | ICD-10-CM | POA: Diagnosis present

## 2021-11-14 DIAGNOSIS — M8448XA Pathological fracture, other site, initial encounter for fracture: Secondary | ICD-10-CM | POA: Diagnosis present

## 2021-11-14 DIAGNOSIS — Z85850 Personal history of malignant neoplasm of thyroid: Secondary | ICD-10-CM

## 2021-11-14 DIAGNOSIS — D1809 Hemangioma of other sites: Secondary | ICD-10-CM | POA: Diagnosis present

## 2021-11-14 DIAGNOSIS — Z87891 Personal history of nicotine dependence: Secondary | ICD-10-CM

## 2021-11-14 DIAGNOSIS — Z955 Presence of coronary angioplasty implant and graft: Secondary | ICD-10-CM

## 2021-11-14 DIAGNOSIS — Z800 Family history of malignant neoplasm of digestive organs: Secondary | ICD-10-CM

## 2021-11-14 DIAGNOSIS — Z7982 Long term (current) use of aspirin: Secondary | ICD-10-CM | POA: Diagnosis not present

## 2021-11-14 DIAGNOSIS — R7303 Prediabetes: Secondary | ICD-10-CM | POA: Diagnosis present

## 2021-11-14 DIAGNOSIS — M4324 Fusion of spine, thoracic region: Secondary | ICD-10-CM | POA: Diagnosis not present

## 2021-11-14 DIAGNOSIS — S32019A Unspecified fracture of first lumbar vertebra, initial encounter for closed fracture: Secondary | ICD-10-CM | POA: Diagnosis not present

## 2021-11-14 DIAGNOSIS — R918 Other nonspecific abnormal finding of lung field: Secondary | ICD-10-CM | POA: Diagnosis present

## 2021-11-14 DIAGNOSIS — Z8719 Personal history of other diseases of the digestive system: Secondary | ICD-10-CM

## 2021-11-14 DIAGNOSIS — K649 Unspecified hemorrhoids: Secondary | ICD-10-CM | POA: Diagnosis present

## 2021-11-14 DIAGNOSIS — M8458XA Pathological fracture in neoplastic disease, other specified site, initial encounter for fracture: Secondary | ICD-10-CM | POA: Diagnosis not present

## 2021-11-14 SURGERY — POSTERIOR LUMBAR FUSION 1 LEVEL
Anesthesia: General | Site: Spine Lumbar

## 2021-11-14 MED ORDER — CHLORHEXIDINE GLUCONATE CLOTH 2 % EX PADS: 6.0000 | MEDICATED_PAD | Freq: Once | CUTANEOUS | Status: DC

## 2021-11-14 MED ORDER — SODIUM CHLORIDE 0.9 % IV SOLN: 250.0000 mL | INTRAVENOUS | Status: DC

## 2021-11-14 MED ORDER — FENTANYL CITRATE (PF) 100 MCG/2ML IJ SOLN
INTRAMUSCULAR | Status: AC
  Filled 2021-11-14: qty 2

## 2021-11-14 MED ORDER — ONDANSETRON HCL 4 MG/2ML IJ SOLN: 4.0000 mg | Freq: Four times a day (QID) | INTRAMUSCULAR | Status: DC | PRN

## 2021-11-14 MED ORDER — ACETAMINOPHEN 10 MG/ML IV SOLN: 1000.0000 mg | Freq: Once | INTRAVENOUS | Status: DC | PRN

## 2021-11-14 MED ORDER — OXYCODONE HCL 5 MG PO TABS
5.0000 mg | ORAL_TABLET | ORAL | Status: DC | PRN
  Administered 2021-11-14 – 2021-11-15 (×4): 5 mg via ORAL
  Filled 2021-11-14 (×5): qty 1

## 2021-11-14 MED ORDER — LIDOCAINE 2% (20 MG/ML) 5 ML SYRINGE
INTRAMUSCULAR | Status: DC | PRN
  Administered 2021-11-14: 100 mg via INTRAVENOUS

## 2021-11-14 MED ORDER — SODIUM CHLORIDE 0.9% FLUSH
3.0000 mL | Freq: Two times a day (BID) | INTRAVENOUS | Status: DC
  Administered 2021-11-14: 3 mL via INTRAVENOUS

## 2021-11-14 MED ORDER — CEFAZOLIN SODIUM-DEXTROSE 2-4 GM/100ML-% IV SOLN
2.0000 g | INTRAVENOUS | Status: AC
  Administered 2021-11-14: 2 g via INTRAVENOUS
  Filled 2021-11-14: qty 100

## 2021-11-14 MED ORDER — LIDOCAINE-EPINEPHRINE 1 %-1:100000 IJ SOLN
INTRAMUSCULAR | Status: AC
  Filled 2021-11-14: qty 1

## 2021-11-14 MED ORDER — SODIUM CHLORIDE 0.9% FLUSH: 3.0000 mL | INTRAVENOUS | Status: DC | PRN

## 2021-11-14 MED ORDER — FLUTICASONE PROPIONATE 50 MCG/ACT NA SUSP: 1.0000 | Freq: Every day | NASAL | Status: DC | PRN

## 2021-11-14 MED ORDER — DEXAMETHASONE SODIUM PHOSPHATE 10 MG/ML IJ SOLN
INTRAMUSCULAR | Status: AC
  Filled 2021-11-14: qty 1

## 2021-11-14 MED ORDER — PHENOL 1.4 % MT LIQD: 1.0000 | OROMUCOSAL | Status: DC | PRN

## 2021-11-14 MED ORDER — PANTOPRAZOLE SODIUM 40 MG PO TBEC
40.0000 mg | DELAYED_RELEASE_TABLET | Freq: Every day | ORAL | Status: DC
  Filled 2021-11-14: qty 1

## 2021-11-14 MED ORDER — DEXAMETHASONE SODIUM PHOSPHATE 10 MG/ML IJ SOLN
INTRAMUSCULAR | Status: DC | PRN
  Administered 2021-11-14: 10 mg via INTRAVENOUS

## 2021-11-14 MED ORDER — SUGAMMADEX SODIUM 200 MG/2ML IV SOLN
INTRAVENOUS | Status: DC | PRN
  Administered 2021-11-14: 200 mg via INTRAVENOUS

## 2021-11-14 MED ORDER — ORAL CARE MOUTH RINSE: 15.0000 mL | Freq: Once | OROMUCOSAL | Status: AC

## 2021-11-14 MED ORDER — ACETAMINOPHEN 10 MG/ML IV SOLN
INTRAVENOUS | Status: AC
  Filled 2021-11-14: qty 100

## 2021-11-14 MED ORDER — BACITRACIN ZINC 500 UNIT/GM EX OINT
TOPICAL_OINTMENT | CUTANEOUS | Status: DC | PRN
  Administered 2021-11-14: 1 via TOPICAL

## 2021-11-14 MED ORDER — OXYCODONE HCL 5 MG PO TABS: 5.0000 mg | ORAL_TABLET | Freq: Once | ORAL | Status: DC | PRN

## 2021-11-14 MED ORDER — PROPOFOL 10 MG/ML IV BOLUS
INTRAVENOUS | Status: AC
  Filled 2021-11-14: qty 20

## 2021-11-14 MED ORDER — BISACODYL 10 MG RE SUPP: 10.0000 mg | Freq: Every day | RECTAL | Status: DC | PRN

## 2021-11-14 MED ORDER — LACTATED RINGERS IV SOLN: INTRAVENOUS | Status: DC

## 2021-11-14 MED ORDER — AMLODIPINE BESYLATE 5 MG PO TABS: 10.0000 mg | ORAL_TABLET | Freq: Every day | ORAL | Status: DC

## 2021-11-14 MED ORDER — LOSARTAN POTASSIUM 50 MG PO TABS
100.0000 mg | ORAL_TABLET | Freq: Every day | ORAL | Status: DC
  Filled 2021-11-14: qty 2

## 2021-11-14 MED ORDER — MENTHOL 3 MG MT LOZG: 1.0000 | LOZENGE | OROMUCOSAL | Status: DC | PRN

## 2021-11-14 MED ORDER — MORPHINE SULFATE (PF) 2 MG/ML IV SOLN: 2.0000 mg | INTRAVENOUS | Status: DC | PRN

## 2021-11-14 MED ORDER — SENNA 8.6 MG PO TABS
1.0000 | ORAL_TABLET | Freq: Two times a day (BID) | ORAL | Status: DC
  Administered 2021-11-14: 8.6 mg via ORAL
  Filled 2021-11-14: qty 1

## 2021-11-14 MED ORDER — ONDANSETRON HCL 4 MG/2ML IJ SOLN
INTRAMUSCULAR | Status: AC
  Filled 2021-11-14: qty 2

## 2021-11-14 MED ORDER — SODIUM CHLORIDE 0.9 % IV SOLN: INTRAVENOUS | Status: DC

## 2021-11-14 MED ORDER — ACETAMINOPHEN 160 MG/5ML PO SOLN: 1000.0000 mg | Freq: Once | ORAL | Status: DC | PRN

## 2021-11-14 MED ORDER — NITROGLYCERIN 0.4 MG SL SUBL: 0.4000 mg | SUBLINGUAL_TABLET | SUBLINGUAL | Status: DC | PRN

## 2021-11-14 MED ORDER — ZOLPIDEM TARTRATE 5 MG PO TABS
10.0000 mg | ORAL_TABLET | Freq: Every day | ORAL | Status: DC
  Administered 2021-11-14: 10 mg via ORAL
  Filled 2021-11-14: qty 2

## 2021-11-14 MED ORDER — BUPIVACAINE HCL (PF) 0.5 % IJ SOLN
INTRAMUSCULAR | Status: DC | PRN
  Administered 2021-11-14: 10 mL

## 2021-11-14 MED ORDER — ACETAMINOPHEN 325 MG PO TABS: 650.0000 mg | ORAL_TABLET | ORAL | Status: DC | PRN

## 2021-11-14 MED ORDER — THROMBIN 5000 UNITS EX SOLR
CUTANEOUS | Status: AC
  Filled 2021-11-14: qty 5000

## 2021-11-14 MED ORDER — METHOCARBAMOL 1000 MG/10ML IJ SOLN: 500.0000 mg | Freq: Four times a day (QID) | INTRAVENOUS | Status: DC | PRN

## 2021-11-14 MED ORDER — EPHEDRINE SULFATE-NACL 50-0.9 MG/10ML-% IV SOSY
PREFILLED_SYRINGE | INTRAVENOUS | Status: DC | PRN
  Administered 2021-11-14 (×2): 5 mg via INTRAVENOUS

## 2021-11-14 MED ORDER — THROMBIN 5000 UNITS EX SOLR: OROMUCOSAL | Status: DC | PRN

## 2021-11-14 MED ORDER — ACETAMINOPHEN 500 MG PO TABS: 1000.0000 mg | ORAL_TABLET | Freq: Once | ORAL | Status: DC | PRN

## 2021-11-14 MED ORDER — ONDANSETRON HCL 4 MG PO TABS
4.0000 mg | ORAL_TABLET | Freq: Four times a day (QID) | ORAL | Status: DC | PRN
  Administered 2021-11-14: 4 mg via ORAL
  Filled 2021-11-14: qty 1

## 2021-11-14 MED ORDER — METHOCARBAMOL 500 MG PO TABS
500.0000 mg | ORAL_TABLET | Freq: Four times a day (QID) | ORAL | Status: DC | PRN
  Administered 2021-11-14 – 2021-11-15 (×4): 500 mg via ORAL
  Filled 2021-11-14 (×5): qty 1

## 2021-11-14 MED ORDER — LIDOCAINE-EPINEPHRINE 1 %-1:100000 IJ SOLN
INTRAMUSCULAR | Status: DC | PRN
  Administered 2021-11-14: 10 mL

## 2021-11-14 MED ORDER — CEFAZOLIN SODIUM-DEXTROSE 2-4 GM/100ML-% IV SOLN
2.0000 g | Freq: Three times a day (TID) | INTRAVENOUS | Status: AC
  Administered 2021-11-14 (×2): 2 g via INTRAVENOUS
  Filled 2021-11-14 (×2): qty 100

## 2021-11-14 MED ORDER — ACETAMINOPHEN 10 MG/ML IV SOLN
INTRAVENOUS | Status: DC | PRN
  Administered 2021-11-14: 1000 mg via INTRAVENOUS

## 2021-11-14 MED ORDER — FENTANYL CITRATE (PF) 250 MCG/5ML IJ SOLN
INTRAMUSCULAR | Status: DC | PRN
  Administered 2021-11-14: 50 ug via INTRAVENOUS
  Administered 2021-11-14: 100 ug via INTRAVENOUS
  Administered 2021-11-14: 75 ug via INTRAVENOUS
  Administered 2021-11-14: 25 ug via INTRAVENOUS

## 2021-11-14 MED ORDER — FENTANYL CITRATE (PF) 250 MCG/5ML IJ SOLN
INTRAMUSCULAR | Status: AC
  Filled 2021-11-14: qty 5

## 2021-11-14 MED ORDER — OXYCODONE HCL 5 MG/5ML PO SOLN: 5.0000 mg | Freq: Once | ORAL | Status: DC | PRN

## 2021-11-14 MED ORDER — LEVOTHYROXINE SODIUM 75 MCG PO TABS
150.0000 ug | ORAL_TABLET | Freq: Every day | ORAL | Status: DC
  Administered 2021-11-15: 150 ug via ORAL
  Filled 2021-11-14: qty 2

## 2021-11-14 MED ORDER — LIDOCAINE 2% (20 MG/ML) 5 ML SYRINGE
INTRAMUSCULAR | Status: AC
  Filled 2021-11-14: qty 5

## 2021-11-14 MED ORDER — PRAVASTATIN SODIUM 40 MG PO TABS
80.0000 mg | ORAL_TABLET | Freq: Every day | ORAL | Status: DC
  Filled 2021-11-14: qty 2

## 2021-11-14 MED ORDER — FENTANYL CITRATE (PF) 100 MCG/2ML IJ SOLN
25.0000 ug | INTRAMUSCULAR | Status: DC | PRN
  Administered 2021-11-14 (×2): 25 ug via INTRAVENOUS
  Administered 2021-11-14: 50 ug via INTRAVENOUS
  Administered 2021-11-14: 25 ug via INTRAVENOUS

## 2021-11-14 MED ORDER — BACITRACIN ZINC 500 UNIT/GM EX OINT
TOPICAL_OINTMENT | CUTANEOUS | Status: AC
  Filled 2021-11-14: qty 28.35

## 2021-11-14 MED ORDER — CARVEDILOL 6.25 MG PO TABS
6.2500 mg | ORAL_TABLET | Freq: Two times a day (BID) | ORAL | Status: DC
  Administered 2021-11-14: 6.25 mg via ORAL
  Filled 2021-11-14: qty 1

## 2021-11-14 MED ORDER — PROPOFOL 10 MG/ML IV BOLUS
INTRAVENOUS | Status: DC | PRN
  Administered 2021-11-14: 150 mg via INTRAVENOUS

## 2021-11-14 MED ORDER — EZETIMIBE 10 MG PO TABS: 10.0000 mg | ORAL_TABLET | Freq: Every day | ORAL | Status: DC

## 2021-11-14 MED ORDER — OXYCODONE HCL 5 MG PO TABS
10.0000 mg | ORAL_TABLET | ORAL | Status: DC | PRN
  Administered 2021-11-14: 10 mg via ORAL
  Filled 2021-11-14: qty 2

## 2021-11-14 MED ORDER — DOCUSATE SODIUM 100 MG PO CAPS
100.0000 mg | ORAL_CAPSULE | Freq: Two times a day (BID) | ORAL | Status: DC
  Administered 2021-11-14: 100 mg via ORAL
  Filled 2021-11-14: qty 1

## 2021-11-14 MED ORDER — KETOROLAC TROMETHAMINE 30 MG/ML IJ SOLN
INTRAMUSCULAR | Status: AC
  Filled 2021-11-14: qty 1

## 2021-11-14 MED ORDER — KETOROLAC TROMETHAMINE 15 MG/ML IJ SOLN
15.0000 mg | Freq: Once | INTRAMUSCULAR | Status: AC
  Administered 2021-11-14 (×2): 15 mg via INTRAVENOUS

## 2021-11-14 MED ORDER — FAMOTIDINE 20 MG PO TABS: 20.0000 mg | ORAL_TABLET | Freq: Every day | ORAL | Status: DC

## 2021-11-14 MED ORDER — BUPIVACAINE HCL (PF) 0.5 % IJ SOLN
INTRAMUSCULAR | Status: AC
  Filled 2021-11-14: qty 30

## 2021-11-14 MED ORDER — CHLORHEXIDINE GLUCONATE 0.12 % MT SOLN
15.0000 mL | Freq: Once | OROMUCOSAL | Status: AC
  Administered 2021-11-14: 15 mL via OROMUCOSAL
  Filled 2021-11-14: qty 15

## 2021-11-14 MED ORDER — 0.9 % SODIUM CHLORIDE (POUR BTL) OPTIME
TOPICAL | Status: DC | PRN
  Administered 2021-11-14: 1000 mL

## 2021-11-14 MED ORDER — ROCURONIUM BROMIDE 10 MG/ML (PF) SYRINGE
PREFILLED_SYRINGE | INTRAVENOUS | Status: AC
  Filled 2021-11-14: qty 10

## 2021-11-14 MED ORDER — ROCURONIUM BROMIDE 10 MG/ML (PF) SYRINGE
PREFILLED_SYRINGE | INTRAVENOUS | Status: DC | PRN
  Administered 2021-11-14: 80 mg via INTRAVENOUS
  Administered 2021-11-14: 20 mg via INTRAVENOUS

## 2021-11-14 MED ORDER — ONDANSETRON HCL 4 MG/2ML IJ SOLN
INTRAMUSCULAR | Status: DC | PRN
  Administered 2021-11-14: 4 mg via INTRAVENOUS

## 2021-11-14 MED ORDER — PHENYLEPHRINE HCL-NACL 20-0.9 MG/250ML-% IV SOLN
INTRAVENOUS | Status: DC | PRN
  Administered 2021-11-14: 40 ug/min via INTRAVENOUS

## 2021-11-14 MED ORDER — ACETAMINOPHEN 650 MG RE SUPP: 650.0000 mg | RECTAL | Status: DC | PRN

## 2021-11-14 SURGICAL SUPPLY — 57 items
ADH SKN CLS APL DERMABOND .7 (GAUZE/BANDAGES/DRESSINGS) ×2
APL SKNCLS STERI-STRIP NONHPOA (GAUZE/BANDAGES/DRESSINGS)
BAG COUNTER SPONGE SURGICOUNT (BAG) ×3 IMPLANT
BAG SPNG CNTER NS LX DISP (BAG) ×4
BASKET BONE COLLECTION (BASKET) ×3 IMPLANT
BENZOIN TINCTURE PRP APPL 2/3 (GAUZE/BANDAGES/DRESSINGS) IMPLANT
BLADE CLIPPER SURG (BLADE) IMPLANT
BLADE SURG 11 STRL SS (BLADE) ×3 IMPLANT
BUR 14 MATCH 3 (BUR) IMPLANT
BUR MATCHSTICK NEURO 3.0 LAGG (BURR) ×3 IMPLANT
BURR 14 MATCH 3 (BUR) ×2
CANISTER SUCT 3000ML PPV (MISCELLANEOUS) ×3 IMPLANT
CNTNR URN SCR LID CUP LEK RST (MISCELLANEOUS) ×3 IMPLANT
CONT SPEC 4OZ STRL OR WHT (MISCELLANEOUS) ×2
COVER BACK TABLE 60X90IN (DRAPES) ×3 IMPLANT
COVERAGE SUPPORT O-ARM STEALTH (MISCELLANEOUS) ×2 IMPLANT
DERMABOND ADVANCED .7 DNX12 (GAUZE/BANDAGES/DRESSINGS) ×3 IMPLANT
DRAPE C-ARM 42X72 X-RAY (DRAPES) ×3 IMPLANT
DRAPE C-ARMOR (DRAPES) ×3 IMPLANT
DRAPE LAPAROTOMY 100X72X124 (DRAPES) ×3 IMPLANT
DRAPE SHEET LG 3/4 BI-LAMINATE (DRAPES) ×12 IMPLANT
DRSG OPSITE POSTOP 4X12 (GAUZE/BANDAGES/DRESSINGS) IMPLANT
DURAPREP 26ML APPLICATOR (WOUND CARE) ×3 IMPLANT
ELECT REM PT RETURN 9FT ADLT (ELECTROSURGICAL) ×2
ELECTRODE REM PT RTRN 9FT ADLT (ELECTROSURGICAL) ×3 IMPLANT
EXTENDER TAB GUIDE SV 5.5/6.0 (INSTRUMENTS) IMPLANT
FEE COVERAGE SUPPORT O-ARM (MISCELLANEOUS) ×3 IMPLANT
GLOVE BIO SURGEON STRL SZ7.5 (GLOVE) IMPLANT
GLOVE BIOGEL PI IND STRL 7.5 (GLOVE) ×6 IMPLANT
GLOVE ECLIPSE 7.0 STRL STRAW (GLOVE) ×6 IMPLANT
GOWN STRL REUS W/ TWL LRG LVL3 (GOWN DISPOSABLE) ×12 IMPLANT
GOWN STRL REUS W/ TWL XL LVL3 (GOWN DISPOSABLE) IMPLANT
GOWN STRL REUS W/TWL LRG LVL3 (GOWN DISPOSABLE) ×8
GOWN STRL REUS W/TWL XL LVL3 (GOWN DISPOSABLE) ×8
HEMOSTAT POWDER KIT SURGIFOAM (HEMOSTASIS) ×3 IMPLANT
KIT BASIN OR (CUSTOM PROCEDURE TRAY) ×3 IMPLANT
KIT POSITION SURG JACKSON T1 (MISCELLANEOUS) ×3 IMPLANT
KIT TURNOVER KIT B (KITS) ×3 IMPLANT
MARKER SPHERE PSV REFLC NDI (MISCELLANEOUS) ×15 IMPLANT
NDL SPNL 18GX3.5 QUINCKE PK (NEEDLE) IMPLANT
NEEDLE HYPO 22GX1.5 SAFETY (NEEDLE) ×3 IMPLANT
NEEDLE SPNL 18GX3.5 QUINCKE PK (NEEDLE) ×2 IMPLANT
NS IRRIG 1000ML POUR BTL (IV SOLUTION) ×3 IMPLANT
PACK LAMINECTOMY NEURO (CUSTOM PROCEDURE TRAY) ×3 IMPLANT
PAD ARMBOARD 7.5X6 YLW CONV (MISCELLANEOUS) ×9 IMPLANT
ROD STRT PERCU 5.5X150 TI (Rod) IMPLANT
SCREW 5.5 VOYAGER MAS 6.5X50 (Screw) IMPLANT
SCREW MAS VOYAGER 5.5X50 (Screw) IMPLANT
SCREW SET 5.5/6.0MM SOLERA (Screw) IMPLANT
SCREW SPINAL IFIX 6.5X45 (Screw) IMPLANT
SUT VIC AB 0 CT1 18XCR BRD8 (SUTURE) ×3 IMPLANT
SUT VIC AB 0 CT1 8-18 (SUTURE) ×4
SUT VICRYL 3-0 RB1 18 ABS (SUTURE) ×3 IMPLANT
TOWEL GREEN STERILE (TOWEL DISPOSABLE) ×3 IMPLANT
TOWEL GREEN STERILE FF (TOWEL DISPOSABLE) ×3 IMPLANT
TRAY FOLEY MTR SLVR 16FR STAT (SET/KITS/TRAYS/PACK) ×3 IMPLANT
WATER STERILE IRR 1000ML POUR (IV SOLUTION) ×3 IMPLANT

## 2021-11-14 NOTE — Transfer of Care (Signed)
Immediate Anesthesia Transfer of Care Note  Patient: [name]  Procedure(s) Performed: OPEN REDUCTION INTERNAL FIXATION OF LUMBAR ONE FRACTURE WITH PLACEMENT OF PERCUTANEOUS SCREWS THORACIC ELEVEN-LUMBAR THREE (Spine Lumbar) Application of O-Arm  Patient Location: PACU  Anesthesia Type:General  Level of Consciousness: awake, alert , and oriented  Airway & Oxygen Therapy: Patient Spontanous Breathing and Patient connected to face mask oxygen  Post-op Assessment: Report given to RN, Post -op Vital signs reviewed and stable, and Patient moving all extremities X 4  Post vital signs: Reviewed and stable  Last Vitals:  Vitals Value Taken Time  BP 129/77 11/14/21 1122  Temp    Pulse 75 11/14/21 1125  Resp 15 11/14/21 1123  SpO2 94 % 11/14/21 1125  Vitals shown include unvalidated device data.  Last Pain:  Vitals:   11/14/21 0610  TempSrc:   PainSc: 2          Complications: No notable events documented.

## 2021-11-14 NOTE — H&P (Signed)
Chief Complaint   Hemangioma  History of Present Illness  ANTRONE WALLA is a 76 y.o. male with a history of lung cancer, who underwent MRI of the lumbar spine more than 1 year ago revealing a lesion in the L1 vertebral body.  Unclear whether this represented hemangioma or bony metastasis.  Patient ultimately underwent CT-guided biopsy confirming a hemangioma.  We have continue to monitor this atypical aggressive hemangioma radiographically which has demonstrated progressive enlargement now with new L1 fracture.  With the continued enlargement and development of a pathologic fracture, treatment of the hemangioma with surgical stabilization followed by radiation treatment was recommended after a lengthy discussion at the multidisciplinary neuro-oncology conference.  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   PONV (postoperative nausea and vomiting)    Pre-diabetes    Pulmonary nodules    Thyroid cancer ([place])    thyroid removed    Past Surgical History   Past Surgical History:  Procedure Laterality Date   CAROTID ENDARTERECTOMY     COLONOSCOPY WITH ESOPHAGOGASTRODUODENOSCOPY (EGD)  2008   CORONARY STENT INTERVENTION N/A 02/20/2016   Procedure: Coronary Stent Intervention;  Surgeon: Burnell Blanks, MD;  Location: [place] [facility];  Service: Cardiovascular;  Laterality: N/A;   ESOPHAGOGASTRODUODENOSCOPY     LEFT HEART CATH  AND CORONARY ANGIOGRAPHY N/A 02/20/2016   Procedure: Left Heart Cath and Coronary Angiography;  Surgeon: Burnell Blanks, MD;  Location: [place] [facility];  Service: Cardiovascular;  Laterality: N/A;   LUNG REMOVAL, PARTIAL Left 06/24/2018   Pt states "1/2 of lung was removed"   PILONIDAL CYST EXCISION     REFRACTIVE SURGERY Right    retina tear repair   THYROIDECTOMY N/A 01/17/2018   Procedure: TOTAL THYROIDECTOMY;  Surgeon: Armandina Gemma, MD;  Location: [facility];  Service: General;  Laterality: N/A;   VIDEO ASSISTED THORACOSCOPY (VATS)/WEDGE RESECTION Left 06/24/2018   Procedure: VIDEO ASSISTED THORACOSCOPY (VATS)/LUNG RESECTION;  Surgeon: Grace Isaac, MD;  Location: [place];  Service: Thoracic;  Laterality: Left;   VIDEO BRONCHOSCOPY WITH ENDOBRONCHIAL NAVIGATION N/A 11/18/2017   Procedure: VIDEO BRONCHOSCOPY WITH ENDOBRONCHIAL NAVIGATION WITH BIOPSIES OF LEFT AND RIGHT UPPER LOBES;  Surgeon: Grace Isaac, MD;  Location: [place];  Service: Thoracic;  Laterality: N/A;   VIDEO BRONCHOSCOPY WITH ENDOBRONCHIAL NAVIGATION N/A 06/11/2018   Procedure: VIDEO BRONCHOSCOPY WITH ENDOBRONCHIAL NAVIGATION;  Surgeon: Grace Isaac, MD;  Location: [place];  Service: Thoracic;  Laterality: N/A;    Social History   Social History   Tobacco Use   Smoking status: Former    Packs/day: 1.00    Years: 3.00    Total pack years: 3.00    Types: Cigarettes    Quit date: 02/01/1967    Years since quitting:  54.8   Smokeless tobacco: Never  Vaping Use   Vaping Use: Never used  Substance Use Topics   Alcohol use: Yes    Comment: a drink before dinner a few times a week (wine or a mixed drink)   Drug use: No    Medications   Prior to Admission medications   Medication Sig Start Date End Date Taking? Authorizing Provider  acetaminophen (TYLENOL) 325 MG tablet Take 650 mg by mouth every 6 (six) hours as needed for moderate pain or headache.    Yes [provider]  amLODipine  (NORVASC) 10 MG tablet Take 10 mg by mouth daily. 12/16/20  Yes [provider]  aspirin EC 81 MG tablet Take 81 mg by mouth daily.   Yes [provider]  carvedilol (COREG) 6.25 MG tablet Take 1 tablet (6.25 mg total) by mouth 2 (two) times daily with a meal. 02/10/21  Yes Dunn, Dayna N, PA-C  Coenzyme Q10 (COQ10) 100 MG CAPS Take 100 mg by mouth daily.   Yes [provider]  ezetimibe (ZETIA) 10 MG tablet TAKE 1 TABLET(10 MG) BY MOUTH DAILY 10/16/21  Yes Jerline Pain, MD  famotidine (PEPCID) 20 MG tablet Take 20 mg by mouth daily.   Yes [provider]  fluticasone (FLONASE) 50 MCG/ACT nasal spray Place 1 spray into both nostrils daily as needed for allergies or rhinitis (Spring and Fall).   Yes [provider]  ibuprofen (ADVIL,MOTRIN) 200 MG tablet Take 200 mg by mouth every 8 (eight) hours as needed for headache or moderate pain.   Yes [provider]  levothyroxine (SYNTHROID) 150 MCG tablet Take 150 mcg by mouth daily before breakfast.   Yes [provider]  losartan (COZAAR) 100 MG tablet Take 100 mg by mouth daily.   Yes [provider]  nitroGLYCERIN (NITROSTAT) 0.4 MG SL tablet PLACE 1 TABLET UNDER THE TONGUE EVERY 5 MINUTES AS NEEDED FOR CHEST PAIN 08/08/21  Yes Jerline Pain, MD  Omega-3 Fatty Acids (FISH OIL) 1000 MG CAPS Take 2 capsules by mouth in the morning and at bedtime.   Yes [provider]  pantoprazole (PROTONIX) 40 MG tablet Take 40 mg by mouth daily. 01/19/20  Yes [provider]  pravastatin (PRAVACHOL) 80 MG tablet TAKE 1 TABLET(80 MG) BY MOUTH DAILY 05/22/21  Yes Jerline Pain, MD  zolpidem (AMBIEN) 10 MG tablet Take 10 mg by mouth at bedtime. 04/29/17  Yes [provider]  LORazepam (ATIVAN) 0.5 MG tablet Take 1 tablet 30 minutes prior to radiation procedures or MRIs. 10/03/21   Eppie Gibson, MD    Allergies   Allergies  Allergen Reactions   Gemfibrozil Other (See  Comments)    Muscle pain   Propranolol     Other reaction(s): SOB  Only had issue with Slow release propranolol   Niaspan [Niacin Er] Rash and Other (See Comments)    Flushing - aspirin did not mitigate    Simvastatin Other (See Comments)    Drug-Drug interaction with amlodipine    Review of Systems  ROS  Neurologic Exam  Awake, alert, oriented Memory and concentration grossly intact Speech fluent, appropriate CN grossly intact Motor exam: Upper Extremities Deltoid Bicep Tricep Grip  Right 5/5 5/5 5/5 5/5  Left 5/5 5/5 5/5 5/5   Lower Extremities IP Quad PF DF EHL  Right 5/5 5/5 5/5 5/5 5/5  Left 5/5 5/5 5/5 5/5 5/5   Sensation grossly intact to LT  Impression  -  77 y.o. 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 We have also reviewed in detail the risks, benefits, and alternatives to the procedure. All questions were answered and [name] provided informed consent to proceed.  Consuella Lose, MD [facility]

## 2021-11-14 NOTE — Op Note (Signed)
  NEUROSURGERY OPERATIVE NOTE   PREOP DIAGNOSIS:  Pathologic L1 fracture Aggressive atypical hemangioma L1   POSTOP DIAGNOSIS: Same  PROCEDURE: Open reduction, internal fixation of L1 fracutre Posterior segmental instrumentation - T11-L3 Medtronic pedicle screws Use of intraoperative stereotactic navigation  SURGEON: Dr. Consuella Lose, MD  ASSISTANT: Dr. Emelda Brothers, MD  ANESTHESIA: General Endotracheal  EBL: 75cc  SPECIMENS: None  DRAINS: None  COMPLICATIONS: None immediate  CONDITION: Hemodynamically stable to PACU  HISTORY: Miguel Wolfe is a 76 y.o. male with a history of lung cancer who initially had discovery of a L1 lesion more than 1 year ago.  This lesion demonstrated some enlargement and he ultimately underwent CT-guided biopsy revealing the presence of an atypical aggressive hemangioma.  The lesion was monitored radiographically and noted slow but progressive enlargement.  The most recent scan demonstrated pathologic fracture at L1 with some extension of the lesion into the ventral epidural space.  His case was discussed at the multidisciplinary neuro-oncology conference and surgical stabilization followed by radiation treatment to the lesion was agreed upon.  The risks, benefits, and alternatives to surgery were all reviewed in detail with the patient and his wife.  After all questions were answered informed consent was obtained and witnessed.  PROCEDURE IN DETAIL: The patient was brought to the operating room. After induction of general anesthesia, the patient was positioned on the operative table in the prone position. All pressure points were meticulously padded.  Midline skin incision was then marked out and prepped and draped in the usual sterile fashion.  After timeout was conducted, the midline incision was infiltrated with local anesthetic with epinephrine.  Spinal needle was introduced in order to identify the L4 spinous process.  The midline skin  incision was then opened sharply and carried down through the subcutaneous tissue until the dorsal fascia was identified.  The skin was then undermined.  The fascia was then incised overlying the L3 spinous process inferiorly.  Spinous process clamp was then placed.  Intraoperative CT scan was then taken.  Registration was noted to be excellent.  At this point, review of the CT scan demonstrated good thoracolumbar alignment and reduction of the fracture.  The navigated drill was then used to create pilot holes for bilateral pedicle screws at T11, T12, L2, and L3.  These were then checked with the stereotactic probe.  Pilot holes were then tapped and screws were inserted bilaterally at T11, T12, L2, and L3.  AP and lateral fluoroscopic images were taken confirming good location of the pedicle screws.  148mm straight rod was then selected and a slight lordotic band was placed on the inferior portion of the rod.  These were then passed through the reduction towers.  Setscrews were then placed and final tightened.  Reduction towers were then removed.  Fascial openings were then closed with interrupted 0 Vicryl stitches.  The deep subcutaneous layer was closed with interrupted 0 Vicryl stitches and the skin was closed with staples.  Bacitracin ointment and sterile dressing was applied.  At the end of the case all sponge, needle, instrument, and cottonoid counts were correct.  Patient was then transferred to the stretcher and extubated.  He was taken to the postanesthesia care unit in stable hemodynamic condition.   Consuella Lose, MD [facility]

## 2021-11-14 NOTE — Anesthesia Procedure Notes (Signed)
Procedure Name: Intubation Date/Time: 11/14/2021 8:24 AM  Performed by: Kyung Rudd, CRNAPre-anesthesia Checklist: Patient identified, Emergency Drugs available, Suction available and Patient being monitored Patient Re-evaluated:Patient Re-evaluated prior to induction Oxygen Delivery Method: Circle system utilized Preoxygenation: Pre-oxygenation with 100% oxygen Induction Type: IV induction Ventilation: Mask ventilation without difficulty Laryngoscope Size: Glidescope and 4 Tube type: Oral Tube size: 7.5 mm Number of attempts: 1 Airway Equipment and Method: Stylet and Video-laryngoscopy Placement Confirmation: ETT inserted through vocal cords under direct vision, positive ETCO2 and breath sounds checked- equal and bilateral Secured at: 21 cm Tube secured with: Tape Dental Injury: Teeth and Oropharynx as per pre-operative assessment  Comments: Electively used Glidescope due to patient complaint of left cord weakness. Grade I view on screen, ETT passed slowly and carefully.

## 2021-11-15 MED ORDER — OXYCODONE HCL 5 MG PO TABS: 5.0000 mg | ORAL_TABLET | Freq: Four times a day (QID) | ORAL | 0 refills | Status: AC | PRN

## 2021-11-15 MED ORDER — TIZANIDINE HCL 4 MG PO TABS: 4.0000 mg | ORAL_TABLET | Freq: Four times a day (QID) | ORAL | 0 refills | Status: DC | PRN

## 2021-11-15 MED ORDER — FAMOTIDINE 20 MG PO TABS
20.0000 mg | ORAL_TABLET | Freq: Every day | ORAL | Status: DC
  Administered 2021-11-15: 20 mg via ORAL
  Filled 2021-11-15: qty 1

## 2021-11-15 NOTE — Anesthesia Postprocedure Evaluation (Signed)
Anesthesia Post Note  Patient: Miguel Wolfe  Procedure(s) Performed: OPEN REDUCTION INTERNAL FIXATION OF LUMBAR ONE FRACTURE WITH PLACEMENT OF PERCUTANEOUS SCREWS THORACIC ELEVEN-LUMBAR THREE (Spine Lumbar) Application of O-Arm     Patient location during evaluation: PACU Anesthesia Type: General Level of consciousness: awake and alert Pain management: pain level controlled Vital Signs Assessment: post-procedure vital signs reviewed and stable Respiratory status: spontaneous breathing, nonlabored ventilation and respiratory function stable Cardiovascular status: blood pressure returned to baseline and stable Postop Assessment: no apparent nausea or vomiting Anesthetic complications: no   No notable events documented.  Last Vitals:  Vitals:   11/14/21 2304 11/15/21 0410  BP: 120/69 124/73  Pulse: 84 72  Resp: 20 18  Temp: 37.1 C 36.6 C  SpO2: 97% 98%    Last Pain:  Vitals:   11/15/21 0410  TempSrc: Oral  PainSc:                  [name]

## 2021-11-15 NOTE — Discharge Summary (Signed)
Physician Discharge Summary  Patient ID: VYOM BRASS MRN: [id number] DOB/AGE: [birth year] 76 y.o.  Admit date: 11/14/2021 Discharge date: 11/15/2021  Admission Diagnoses:  Pathologic L1 fracture Vertebral hemangioma  Discharge Diagnoses: Same Principal Problem:   L1 vertebral fracture [facility])   Discharged Condition: Stable  Hospital Course:  Mrs. Miguel Wolfe is a 76 y.o. male who underwent uncomplicated O53-G6 ORIF of pathologic L1 fracture. On POD#1 the patient was at neurologic baseline. Back pain was controlled with oral medication, he was ambulating without difficulty, voiding normally, and tolerating diet.  Treatments: Surgery - ORIF L1 fracture, T11-L3 stabilization  Discharge Exam: Blood pressure 116/60, pulse 80, temperature 99 F (37.2 C), temperature source Oral, resp. rate 17, height 5\' 11"  (1.803 m), weight 74.4 kg, SpO2 99 %. Awake, alert, oriented Speech fluent, appropriate CN grossly intact 5/5 BUE/BLE Wound c/d/i  Follow-up: Follow-up in my office [facility] [phone]) in 2-3 weeks  Disposition: Discharge disposition: 01-Home or Self Care       Discharge Instructions     Call MD for:  redness, tenderness, or signs of infection (pain, swelling, redness, odor or green/yellow discharge around incision site)   Complete by: As directed    Call MD for:  temperature >100.4   Complete by: As directed    Diet - low sodium heart healthy   Complete by: As directed    Discharge instructions   Complete by: As directed    Walk at home as much as possible, at least 4 times / day   Incentive spirometry RT   Complete by: As directed    Increase activity slowly   Complete by: As directed    Lifting restrictions   Complete by: As directed    No lifting > 10 lbs   May shower / Bathe   Complete by: As directed    48 hours after surgery   May walk up steps   Complete by: As directed    Other Restrictions   Complete by: As directed     No bending/twisting at waist   Remove dressing in 24 hours   Complete by: As directed       Allergies as of 11/15/2021       Reactions   Gemfibrozil Other (See Comments)   Muscle pain   Propranolol    Other reaction(s): SOB Only had issue with Slow release propranolol   Niaspan [niacin Er] Rash, Other (See Comments)   Flushing - aspirin did not mitigate    Simvastatin Other (See Comments)   Drug-Drug interaction with amlodipine        Medication List     TAKE these medications    acetaminophen 325 MG tablet Commonly known as: TYLENOL Take 650 mg by mouth every 6 (six) hours as needed for moderate pain or headache.   amLODipine 10 MG tablet Commonly known as: NORVASC Take 10 mg by mouth daily.   aspirin EC 81 MG tablet Take 81 mg by mouth daily.   carvedilol 6.25 MG tablet Commonly known as: COREG Take 1 tablet (6.25 mg total) by mouth 2 (two) times daily with a meal.   CoQ10 100 MG Caps Take 100 mg by mouth daily.   ezetimibe 10 MG tablet Commonly known as: ZETIA TAKE 1 TABLET(10 MG) BY MOUTH DAILY   famotidine 20 MG tablet Commonly known as: PEPCID Take 20 mg by mouth daily.   Fish Oil 1000 MG Caps Take 2 capsules by mouth in the morning and  at bedtime.   fluticasone 50 MCG/ACT nasal spray Commonly known as: FLONASE Place 1 spray into both nostrils daily as needed for allergies or rhinitis (Spring and Fall).   ibuprofen 200 MG tablet Commonly known as: ADVIL Take 200 mg by mouth every 8 (eight) hours as needed for headache or moderate pain.   levothyroxine 150 MCG tablet Commonly known as: SYNTHROID Take 150 mcg by mouth daily before breakfast.   LORazepam 0.5 MG tablet Commonly known as: ATIVAN Take 1 tablet 30 minutes prior to radiation procedures or MRIs.   losartan 100 MG tablet Commonly known as: COZAAR Take 100 mg by mouth daily.   nitroGLYCERIN 0.4 MG SL tablet Commonly known as: NITROSTAT PLACE 1 TABLET UNDER THE TONGUE EVERY 5  MINUTES AS NEEDED FOR CHEST PAIN   oxyCODONE 5 MG immediate release tablet Commonly known as: Oxy IR/ROXICODONE Take 1 tablet (5 mg total) by mouth every 6 (six) hours as needed for up to 7 days for moderate pain ((score 4 to 6)).   pantoprazole 40 MG tablet Commonly known as: PROTONIX Take 40 mg by mouth daily.   pravastatin 80 MG tablet Commonly known as: PRAVACHOL TAKE 1 TABLET(80 MG) BY MOUTH DAILY   tiZANidine 4 MG tablet Commonly known as: ZANAFLEX Take 1 tablet (4 mg total) by mouth every 6 (six) hours as needed for muscle spasms.   zolpidem 10 MG tablet Commonly known as: AMBIEN Take 10 mg by mouth at bedtime.        Follow-up Information     Consuella Lose, MD Follow up.   Specialty: Neurosurgery Contact information: [address] [place] [address] [phone]                 Signed: Jairo Ben 11/15/2021, 8:22 AM

## 2021-11-15 NOTE — Progress Notes (Signed)
PT Cancellation Note and Discharge  Patient Details Name: Miguel Wolfe MRN: [id number] DOB: October 06, 1945   Cancelled Treatment:    Reason Eval/Treat Not Completed: PT screened, no needs identified, will sign off. Discussed pt case with OT who reports pt is currently mobilizing without assistance and does not require a formal PT evaluation at this time. OT completed stair training. PT signing off. If needs change, please reconsult.     Thelma Comp 11/15/2021, 10:06 AM  Rolinda Roan, PT, DPT Acute Rehabilitation Services Secure Chat Preferred Office: [phone]

## 2021-11-15 NOTE — Plan of Care (Signed)
Pt and wife given D/C instructions with verbal understanding. Rx's were sent to the pharmacy by MD. Pt's incision has a scant amount of drainage with no sign of infection. Pt's IV was removed prior to D/C. Pt D/C'd home via wheelchair per MD order. Pt is stable @ D/C and has no other needs at this time. Holli Humbles, RN

## 2021-11-15 NOTE — Evaluation (Signed)
Occupational Therapy Evaluation Patient Details Name: Miguel Wolfe MRN: [id number] DOB: January 17, 1945 Today's Date: 11/15/2021   History of Present Illness Patient is a 76 yo male presenting to hospital for ORIF of L1 due to fracture on 11/14/21. PMH includes : lung cancer, L1 hemangioma with progressive enlargement leading to fracture,   History of thyroid cancer s/p thyroidectomy and lung cancer s/p left upper lobectomy.   Clinical Impression   Prior to this admission, patient living with his wife and independent in ADLs and IADLs. Patient currently requiring min A for lower body dressing due to minimal discomfort, otherwise at supervision/mod I level for functional transfers, ambulation, and other ADLs. Handout provided with regard to back precautions with good teach provided from both patient and wife throughout session. Patient with no further OT needs; OT signing off at this time.      Recommendations for follow up therapy are one component of a multi-disciplinary discharge planning process, led by the attending physician.  Recommendations may be updated based on patient status, additional functional criteria and insurance authorization.   Follow Up Recommendations  No OT follow up    Assistance Recommended at Discharge PRN  Patient can return home with the following Assist for transportation    Functional Status Assessment  Patient has had a recent decline in their functional status and demonstrates the ability to make significant improvements in function in a reasonable and predictable amount of time.  Equipment Recommendations  None recommended by OT (Patient has DME needed)    Recommendations for Other Services       Precautions / Restrictions Precautions Precautions: Back Precaution Booklet Issued: Yes (comment) Precaution Comments: Verbally reviewed, patient demonstrated, handout given Restrictions Weight Bearing Restrictions: No      Mobility Bed Mobility                General bed mobility comments: patient standing and conversing with MD upon OT arrival    Transfers Overall transfer level: Modified independent                 General transfer comment: no use of AD, able to ascend and decend stairs without assist      Balance Overall balance assessment: Modified Independent                                         ADL either performed or assessed with clinical judgement   ADL Overall ADL's : Needs assistance/impaired Eating/Feeding: Sitting;Supervision/ safety   Grooming: Wash/dry hands;Wash/dry face;Modified independent   Upper Body Bathing: Supervision/ safety   Lower Body Bathing: Minimal assistance   Upper Body Dressing : Supervision/safety   Lower Body Dressing: Minimal assistance Lower Body Dressing Details (indicate cue type and reason): to don socks, is able to complete figure 4, wife provided minimal assist needed due to pain Toilet Transfer: Copy Details (indicate cue type and reason): ambulating without need for AD         Functional mobility during ADLs: Set up General ADL Comments: Patient very near baseline, needing minimal assit for lower body dressing, with wife able to provide until pain and mobility improves     Vision Baseline Vision/History: 1 Wears glasses Ability to See in Adequate Light: 0 Adequate Patient Visual Report: No change from baseline       Perception     Praxis      Pertinent  Vitals/Pain Pain Assessment Pain Assessment: No/denies pain     Hand Dominance     Extremity/Trunk Assessment Upper Extremity Assessment Upper Extremity Assessment: Overall WFL for tasks assessed   Lower Extremity Assessment Lower Extremity Assessment: Overall WFL for tasks assessed   Cervical / Trunk Assessment Cervical / Trunk Assessment: Back Surgery   Communication Communication Communication: No difficulties   Cognition Arousal/Alertness:  Awake/alert Behavior During Therapy: WFL for tasks assessed/performed Overall Cognitive Status: Within Functional Limits for tasks assessed                                       General Comments       Exercises     Shoulder Instructions      Home Living Family/patient expects to be discharged to:: Private residence Living Arrangements: Spouse/significant other Available Help at Discharge: Family;Available 24 hours/day Type of Home: House Home Access: Stairs to enter [employer] of Steps: 1   Home Layout: Two level;Bed/bath upstairs Alternate Level Stairs-Number of Steps: 14 Alternate Level Stairs-Rails: Left (as chair lift as well if needed) Bathroom Shower/Tub: Occupational psychologist: Standard Bathroom Accessibility: Yes   Home Equipment: Conservation officer, nature (2 wheels);Cane - single point;Shower seat;BSC/3in1;Grab bars - tub/shower   Additional Comments: Borrowed 3n1 from church to put over toilets, has shower chair if needed      Prior Functioning/Environment Prior Level of Function : Independent/Modified Independent;Driving             Mobility Comments: independent ADLs Comments: independent        OT Problem List: Decreased range of motion;Decreased strength      OT Treatment/Interventions:      OT Goals(Current goals can be found in the care plan section) Acute Rehab OT Goals Patient Stated Goal: to get back home get back to my routine OT Goal Formulation: With patient/family Time For Goal Achievement: 11/29/21 Potential to Achieve Goals: Good  OT Frequency:      Co-evaluation              AM-PAC OT "6 Clicks" Daily Activity     Outcome Measure Help from another person eating meals?: None Help from another person taking care of personal grooming?: None Help from another person toileting, which includes using toliet, bedpan, or urinal?: None Help from another person bathing (including washing, rinsing,  drying)?: A Little Help from another person to put on and taking off regular upper body clothing?: None Help from another person to put on and taking off regular lower body clothing?: A Little 6 Click Score: 22   End of Session Nurse Communication: Mobility status;Other (comment) (no need for PT eval; ok to discharge)  Activity Tolerance: Patient tolerated treatment well Patient left: in bed;with call bell/phone within reach;with family/visitor present (sitting EOB)  OT Visit Diagnosis: Unsteadiness on feet (R26.81)                Time: 1103-1594 OT Time Calculation (min): 22 min Charges:  OT General Charges $OT Visit: 1 Visit OT Evaluation $OT Eval Moderate Complexity: 1 Mod  Corinne Ports E. Humberto Addo, OTR/L Acute Rehabilitation Services [phone]   Ascencion Dike 11/15/2021, 9:14 AM

## 2021-11-17 ENCOUNTER — Other Ambulatory Visit: Payer: Self-pay | Admitting: Cardiology

## 2021-11-27 ENCOUNTER — Other Ambulatory Visit: Payer: Medicare Other

## 2021-11-28 ENCOUNTER — Ambulatory Visit: Payer: Self-pay | Admitting: Radiation Oncology

## 2021-11-28 ENCOUNTER — Ambulatory Visit: Payer: Medicare Other | Admitting: Radiation Oncology

## 2021-12-05 NOTE — Progress Notes (Incomplete)
Location/Histology of Tumor:  Hemangioma of bone   Patient presented with symptoms of:    Miguel Wolfe is a 76 y.o. male with a history of lung cancer, who underwent MRI of the lumbar spine more than 1 year ago revealing a lesion in the L1 vertebral body.  Unclear whether this represented hemangioma or bony metastasis.  Patient ultimately underwent CT-guided biopsy confirming a hemangioma.  We have continue to monitor this atypical aggressive hemangioma radiographically which has demonstrated progressive enlargement now with new L1 fracture.  With the continued enlargement and development of a pathologic fracture, treatment of the hemangioma with surgical stabilization followed by radiation treatment was recommended after a lengthy discussion at the multidisciplinary neuro-oncology conference.   Past or anticipated interventions, if any, per neurosurgery:  11-14-21 PROCEDURE: Open reduction, internal fixation of L1 fracutre Posterior segmental instrumentation - T11-L3 Medtronic pedicle screws Use of intraoperative stereotactic navigation   SURGEON: Dr. Consuella Lose, MD   Past or anticipated interventions, if any, per medical oncology: Pt so see Dr. Julien Nordmann on 03-03-21.   Dose of Decadron, if applicable: {:12458}  Recent neurologic symptoms, if any:  Seizures: {:18581} Headaches: {:18581} Nausea: {:18581} Dizziness/ataxia: {:18581} Difficulty with hand coordination: {:18581} Focal numbness/weakness: {:18581} Visual deficits/changes: {:18581} Confusion/Memory deficits: {:18581}  Painful bone metastases at present, if any: {:18581}  SAFETY ISSUES: Prior radiation? {:18581} Pacemaker/ICD? {:18581} Possible current pregnancy? {:18581} Is the patient on methotrexate? {:18581}  Additional Complaints / other details: ***

## 2021-12-06 ENCOUNTER — Ambulatory Visit: Payer: Medicare Other | Admitting: Radiation Oncology

## 2021-12-06 ENCOUNTER — Other Ambulatory Visit: Payer: Self-pay | Admitting: Physician Assistant

## 2021-12-06 DIAGNOSIS — M47816 Spondylosis without myelopathy or radiculopathy, lumbar region: Secondary | ICD-10-CM | POA: Diagnosis not present

## 2021-12-06 DIAGNOSIS — R1031 Right lower quadrant pain: Secondary | ICD-10-CM

## 2021-12-06 DIAGNOSIS — D1809 Hemangioma of other sites: Secondary | ICD-10-CM | POA: Diagnosis not present

## 2021-12-06 DIAGNOSIS — M8468XD Pathological fracture in other disease, other site, subsequent encounter for fracture with routine healing: Secondary | ICD-10-CM | POA: Diagnosis not present

## 2021-12-08 ENCOUNTER — Other Ambulatory Visit: Payer: Medicare Other

## 2021-12-12 ENCOUNTER — Inpatient Hospital Stay
Admission: RE | Admit: 2021-12-12 | Discharge: 2021-12-12 | Disposition: A | Discharge status: Home / Self Care | Payer: Medicare Other | Source: Ambulatory Visit | Attending: Radiation Oncology | Admitting: Radiation Oncology

## 2021-12-12 ENCOUNTER — Ambulatory Visit: Payer: Medicare Other | Admitting: Radiation Oncology

## 2021-12-12 DIAGNOSIS — D1809 Hemangioma of other sites: Secondary | ICD-10-CM

## 2021-12-14 ENCOUNTER — Ambulatory Visit
Admission: RE | Admit: 2021-12-14 | Discharge: 2021-12-14 | Disposition: A | Discharge status: Home / Self Care | Payer: Medicare Other | Source: Ambulatory Visit | Attending: Physician Assistant | Admitting: Physician Assistant

## 2021-12-14 DIAGNOSIS — R1031 Right lower quadrant pain: Secondary | ICD-10-CM

## 2021-12-14 DIAGNOSIS — R1030 Lower abdominal pain, unspecified: Secondary | ICD-10-CM | POA: Diagnosis not present

## 2021-12-14 DIAGNOSIS — R1909 Other intra-abdominal and pelvic swelling, mass and lump: Secondary | ICD-10-CM | POA: Diagnosis not present

## 2021-12-18 ENCOUNTER — Ambulatory Visit: Payer: Medicare Other | Admitting: Radiation Oncology

## 2021-12-20 ENCOUNTER — Ambulatory Visit: Payer: Medicare Other | Admitting: Radiation Oncology

## 2021-12-22 ENCOUNTER — Ambulatory Visit: Payer: Medicare Other | Admitting: Radiation Oncology

## 2021-12-25 ENCOUNTER — Ambulatory Visit: Payer: Medicare Other | Admitting: Radiation Oncology

## 2021-12-25 DIAGNOSIS — K4090 Unilateral inguinal hernia, without obstruction or gangrene, not specified as recurrent: Secondary | ICD-10-CM | POA: Diagnosis not present

## 2021-12-25 DIAGNOSIS — M899 Disorder of bone, unspecified: Secondary | ICD-10-CM | POA: Diagnosis not present

## 2021-12-25 DIAGNOSIS — R7303 Prediabetes: Secondary | ICD-10-CM | POA: Diagnosis not present

## 2021-12-25 DIAGNOSIS — K219 Gastro-esophageal reflux disease without esophagitis: Secondary | ICD-10-CM | POA: Diagnosis not present

## 2021-12-25 DIAGNOSIS — E890 Postprocedural hypothyroidism: Secondary | ICD-10-CM | POA: Diagnosis not present

## 2021-12-25 DIAGNOSIS — G4700 Insomnia, unspecified: Secondary | ICD-10-CM | POA: Diagnosis not present

## 2021-12-25 DIAGNOSIS — E782 Mixed hyperlipidemia: Secondary | ICD-10-CM | POA: Diagnosis not present

## 2021-12-25 DIAGNOSIS — I25111 Atherosclerotic heart disease of native coronary artery with angina pectoris with documented spasm: Secondary | ICD-10-CM | POA: Diagnosis not present

## 2021-12-25 DIAGNOSIS — Z85118 Personal history of other malignant neoplasm of bronchus and lung: Secondary | ICD-10-CM | POA: Diagnosis not present

## 2021-12-25 DIAGNOSIS — N5082 Scrotal pain: Secondary | ICD-10-CM | POA: Diagnosis not present

## 2021-12-25 DIAGNOSIS — Z85850 Personal history of malignant neoplasm of thyroid: Secondary | ICD-10-CM | POA: Diagnosis not present

## 2021-12-25 DIAGNOSIS — I10 Essential (primary) hypertension: Secondary | ICD-10-CM | POA: Diagnosis not present

## 2021-12-27 ENCOUNTER — Ambulatory Visit: Payer: Medicare Other | Admitting: Radiation Oncology

## 2021-12-28 NOTE — Progress Notes (Signed)
Location/Histology of Brain Tumor:  Hemangioma of the Bone  Patient presented with symptoms of:   Miguel Wolfe is a 76 y.o. male with a history of lung cancer, who underwent MRI of the lumbar spine more than 1 year ago revealing a lesion in the L1 vertebral body.  Unclear whether this represented hemangioma or bony metastasis.  Patient ultimately underwent CT-guided biopsy confirming a hemangioma.  We have continue to monitor this atypical aggressive hemangioma radiographically which has demonstrated progressive enlargement now with new L1 fracture.  With the continued enlargement and development of a pathologic fracture, treatment of the hemangioma with surgical stabilization followed by radiation treatment was recommended after a lengthy discussion at the multidisciplinary neuro-oncology conference.   FINAL MICROSCOPIC DIAGNOSIS:   A. BONE, L1, BIOPSY:  - Vascular lesion consistent with clinically suspected hemangioma.  - Negative for malignancy.  - See comment.   COMMENT:  Immunohistochemistry for CD34 highlights the vascular lesion.  Cytokeratin AE1/AE3 and TTF-1 are negative.   GROSS DESCRIPTION:  Received in formalin are 2 x 2 x 0.5 cm of soft dark red clotted blood  and fragments of tan bone measuring 0.4 x 0.2 x 0.2 cm in aggregate.  The specimen is entirely submitted in 3 cassettes.  1, 2 = blood clot  3 = decalcified (Immunocal) bone (GRP 05/02/2021)   Final Diagnosis performed by Claudette Laws, MD.   Electronically signed  05/08/2021   Past or anticipated interventions, if any, per neurosurgery:   11-14-21  PREOP DIAGNOSIS:  Pathologic L1 fracture Aggressive atypical hemangioma L1    POSTOP DIAGNOSIS: Same   PROCEDURE: Open reduction, internal fixation of L1 fracutre Posterior segmental instrumentation - T11-L3 Medtronic pedicle screws Use of intraoperative stereotactic navigation   SURGEON: Dr. Consuella Lose, MD   ASSISTANT: Dr. Emelda Brothers, MD  Past or  anticipated interventions, if any, per medical oncology:  None at this time  Dose of Decadron, if applicable: no  Recent neurologic symptoms, if any:  Seizures: no but muscle spasms Headaches: no Nausea: yes at times from pain in back- ginger ale helps Dizziness/ataxia: no Difficulty with hand coordination: no Focal numbness/weakness: numbness in toes after sx Visual deficits/changes: no Confusion/Memory deficits: no  Painful bone metastases at present, if any: to back, mild 3-4/10  SAFETY ISSUES: Prior radiation? no Pacemaker/ICD? no Possible current pregnancy? no Is the patient on methotrexate? no  Additional Complaints / other details: pt has questions, has lost 15 pounds without trying  Vitals:   01/09/22 0800  BP: 127/72  Pulse: 70  Resp: 18  Temp: 98 F (36.7 C)  SpO2: 100%

## 2022-01-04 ENCOUNTER — Ambulatory Visit
Admission: RE | Admit: 2022-01-04 | Discharge: 2022-01-04 | Disposition: A | Discharge status: Home / Self Care | Payer: Medicare Other | Source: Ambulatory Visit | Attending: Radiation Oncology | Admitting: Radiation Oncology

## 2022-01-04 DIAGNOSIS — D1809 Hemangioma of other sites: Secondary | ICD-10-CM

## 2022-01-04 MED ORDER — GADOPICLENOL 0.5 MMOL/ML IV SOLN
7.0000 mL | Freq: Once | INTRAVENOUS | Status: AC | PRN
  Administered 2022-01-04: 7 mL via INTRAVENOUS

## 2022-01-04 MED ORDER — GADOPICLENOL 0.5 MMOL/ML IV SOLN: 7.0000 mL | Freq: Once | INTRAVENOUS | Status: DC | PRN

## 2022-01-08 NOTE — Progress Notes (Signed)
Radiation Oncology         [phone] ________________________________  Follow-up New Visit  Outpatient   Name: Miguel Wolfe MRN: [id number]  Date: 01/09/2022  DOB: 02/20/1945  WC:BJSEGB, Shanon Brow, MD  Consuella Lose, MD   REFERRING PHYSICIAN: Consuella Lose, MD  DIAGNOSIS: No diagnosis found.   Cancer Staging  Lung cancer, left upper lobe [facility]) Staging form: Lung, AJCC 8th Edition - Pathologic stage from 06/23/2018: Stage IA3 (pT1c, pN0, cM0) - Signed by Grace Isaac, MD on 06/25/2018 Histopathologic type: Adenocarcinoma, NOS Stage prefix: Initial diagnosis Type of lung cancer: Surgically resected non-small cell lung cancer Perineural invasion (PNI): Absent - Clinical: No stage assigned - Unsigned   CHIEF COMPLAINT: Here to discuss post-op SRT in management of hemangioma of the spine    Narrative / Interval History ::Miguel Wolfe is a 77 y.o. male who returns today for further discussion of radiation therapy in management of his spinal hemangioma (specifically L1). In the interval since his initial consultation, the patient underwent internal fixation / reduction of the L1 fracture, along with posterior segmental instrumentation of T11-L3 under the care of Dr. Kathyrn Sheriff on 11/14/21.   Pertinent imaging performed in the interval since his initial consultation includes:  -- Ultrasound of the right groin soft tissues on 12/14/21 (for further evaluation of right groin pain and swelling) which demonstrated a right inguinal hernia containing a bowel loop, which was only appreciable when the patient is standing during the study. [place] otherwise showed no masses or fluid collections.  -- MRI of the lumbar spine with and without contrast on 01/04/22 demonstrated: post-op posterior spinal fusion from T11 through L3 with excessive hardware artifact, stable configuration of the aggressive L1 Hemangioma and subsequent pathologic compression fracture since his MRI performed in  September, and a stable appearing adjacent right paraspinal lesion which appeared isointense to the hemangioma by MRI, but non-ossified by CT and PET-CT.  HPI Initial Consultation 10/03/21::Miguel Wolfe is a 77 y.o. male who presents today for consideration of radiation therapy in management of his diagnosed hemangioma of the spine. The patient has a medical history significant for left lung cancer s/p wedge resection in June 2020, and thyroid cancer s/p thyroidectomy. He did not undergo adjuvant treatment for either. The patient was followed by Dr. Julien Nordmann after LUL resection. His only complaint/symptom since that time have been chronic back pain secondary to his L1 hemangioma.     The patient presented for a restaging chest CT performed on 02/02/2020 which first demonstrated enlargement of an indeterminate lucent lesion with sclerotic margins posteriorly in the L1 vertebral body, raising potential concern for metastatic disease at the time. CT otherwise showed no evidence of metastatic disease or local recurrence of left lung cancer.    MRI of the lumbar spine on 02/13/2020 showed the L1 lesion with features most concerning for atypical hemangioma, measuring 2.4 cm.    Dr. Julien Nordmann reviewed these findings with the patient and recommended that he continue on observation with repeat imaging in 6 months.    Chest CT with contrast on 08/22/20 showed interval progression of the lucent L1 vertebral body lesion with possible disruption of the posterior vertebral body cortex. Given continued progression, metastatic disease remained a concern. CT also showed stable appearance of the right paraspinal soft tissue nodule at the level of L1. MRI of the lumbar spine on 09/06/20 also showed enlargement of the enhancing lesion within the L1 vertebral body since February. Diameter of the lesion was appreciated to  have increased from 24-34 mm, with indistinct margins noted. Suspicion of early extension into the ventral  epidural space was also noted.    Restaging PET scan on 09/26/20 showed low level activity of the L1 vertebral lesion favoring aggressive hemangioma over osseous metastasis. The right paravertebral lesion was also noted to be similar when compared to imaging in 2019 and with low-level activity, favoring a benign lesion such as a nerve sheath or other neurogenic tumor. Otherwise, PET showed no evidence of metastatic disease.    The patient was then referred to Dr. Kathyrn Sheriff, Neurology, on 10/05/20 for further evaluation. During this visit, the patient endorsed intermittent back pain which was manageable with OTC tylenol. Ultimately, the patient was advised to remain on observation.    Follow up chest CT on 03/01/21 showed similar appearance of the L1 vertebral body lesion, again favoring hemangioma over osseous metastasis. The right paravertebral/retrocrural lesion also appeared stable, favoring neurogenic tumor.  CT also showed no evidence of recurrent disease in the left lung, or evidence of metastatic disease.    MRI of the lumbar spine on 04/11/21 showed continued interval enlargement of an enhancing bone lesion within the L1 vertebral body, measuring 3.6 x 3.4 cm, previously 2.8 x 2.7 cm on imaging from 09/06/2020. Bulging of the posterior wall of the vertebral body was also appreciated, with presumed extension into the ventral epidural space. 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     Given interval enlargement, the patient underwent biopsy of the L1 lesion on 05/02/21 under Dr. Kathyrn Sheriff. Pathology revealed findings consistent with clinically suspected hemangioma. Following biopsy, the patient established care with OP physical therapy for his lower back pain.    Recently, the patient presented for a follow up MRI of the lumbar spine with  and without contrast on 09/25/21 which showed further enlargement of the L1 lesion with osseous retropulsion and new pathologic fracture. MRI otherwise showed the enhancing right paraspinal lesion at L1 as unchanged. No other suspicious osseous lesions were appreciated.     Given progressive enlargement of the course of over 1 year, as well as development of pathologic fracture, Dr. Kathyrn Sheriff advised the patient to proceed with operative stabilization followed by radiation treatment to the L1 hemangioma. Dr. Kathyrn Sheriff also presented the patient's case at the neuro-oncology conference on 10/02/21. Disposition concurred with Dr. Cleotilde Neer recommendation above. (Plan is for operative open reduction and internal fixation of of the L1 pathologic fracture. Dr. Kathyrn Sheriff recommends RT a few weeks after surgery to allow the wound sufficient time to heal).    He denies pain in his upper lumbar spine.  He has pain in the lower lumbar spine, around the L5, which he has been told is likely due to arthritic changes   PREVIOUS RADIATION THERAPY: No  PAST MEDICAL HISTORY:  has a past medical history of Anxiety, Aortic insufficiency, Arthritis, CAD (coronary artery disease), Colon polyps, Family history of adverse reaction to anesthesia, Family history of colon cancer, Family history of stomach cancer, Fatigue, GERD (gastroesophageal reflux disease), Hemorrhoids, History of echocardiogram, Hyperlipidemia, Hypertension, Lung cancer ([place]), NSCL CA (06/2018), PONV (postoperative nausea and vomiting), Pre-diabetes, Pulmonary nodules, and Thyroid cancer ([place]).    PAST SURGICAL HISTORY: Past Surgical History:  Procedure Laterality Date   CAROTID ENDARTERECTOMY     COLONOSCOPY WITH ESOPHAGOGASTRODUODENOSCOPY (EGD)  2008   CORONARY STENT INTERVENTION  N/A 02/20/2016   Procedure: Coronary Stent Intervention;  Surgeon: Burnell Blanks, MD;  Location: [place] [facility];  Service: Cardiovascular;  Laterality: N/A;    ESOPHAGOGASTRODUODENOSCOPY     LEFT HEART CATH AND CORONARY ANGIOGRAPHY N/A 02/20/2016   Procedure: Left Heart Cath and Coronary Angiography;  Surgeon: Burnell Blanks, MD;  Location: [place] [facility];  Service: Cardiovascular;  Laterality: N/A;   LUNG REMOVAL, PARTIAL Left 06/24/2018   Pt states "1/2 of lung was removed"   PILONIDAL CYST EXCISION     REFRACTIVE SURGERY Right    retina tear repair   THYROIDECTOMY N/A 01/17/2018   Procedure: TOTAL THYROIDECTOMY;  Surgeon: Armandina Gemma, MD;  Location: [facility];  Service: General;  Laterality: N/A;   VIDEO ASSISTED THORACOSCOPY (VATS)/WEDGE RESECTION Left 06/24/2018   Procedure: VIDEO ASSISTED THORACOSCOPY (VATS)/LUNG RESECTION;  Surgeon: Grace Isaac, MD;  Location: [place];  Service: Thoracic;  Laterality: Left;   VIDEO BRONCHOSCOPY WITH ENDOBRONCHIAL NAVIGATION N/A 11/18/2017   Procedure: VIDEO BRONCHOSCOPY WITH ENDOBRONCHIAL NAVIGATION WITH BIOPSIES OF LEFT AND RIGHT UPPER LOBES;  Surgeon: Grace Isaac, MD;  Location: [place];  Service: Thoracic;  Laterality: N/A;   VIDEO BRONCHOSCOPY WITH ENDOBRONCHIAL NAVIGATION N/A 06/11/2018   Procedure: VIDEO BRONCHOSCOPY WITH ENDOBRONCHIAL NAVIGATION;  Surgeon: Grace Isaac, MD;  Location: [place];  Service: Thoracic;  Laterality: N/A;    FAMILY HISTORY: family history includes Cancer in his paternal uncle and paternal uncle; Colon cancer (age of onset: 60) in his cousin; Crohn's disease in his sister; Healthy in his sister; Heart attack (age of onset: 61) in his father; Parkinsonism in his sister; Stomach cancer in his paternal uncle; Throat cancer in his paternal uncle; Thyroid cancer in his daughter.  SOCIAL HISTORY:  reports that he quit smoking about 54 years ago. His smoking use included cigarettes. He has a 3.00 pack-year smoking history. He has never used smokeless tobacco. He reports current alcohol use. He reports that he does not use drugs.  ALLERGIES: Gemfibrozil,  Propranolol, Niaspan [niacin er], and Simvastatin  MEDICATIONS:  Current Outpatient Medications  Medication Sig Dispense Refill   acetaminophen (TYLENOL) 325 MG tablet Take 650 mg by mouth every 6 (six) hours as needed for moderate pain or headache.      amLODipine (NORVASC) 10 MG tablet Take 10 mg by mouth daily.     aspirin EC 81 MG tablet Take 81 mg by mouth daily.     carvedilol (COREG) 6.25 MG tablet Take 1 tablet (6.25 mg total) by mouth 2 (two) times daily with a meal. 180 tablet 3   Coenzyme Q10 (COQ10) 100 MG CAPS Take 100 mg by mouth daily.     ezetimibe (ZETIA) 10 MG tablet TAKE 1 TABLET(10 MG) BY MOUTH DAILY 90 tablet 3   famotidine (PEPCID) 20 MG tablet Take 20 mg by mouth daily.     fluticasone (FLONASE) 50 MCG/ACT nasal spray Place 1 spray into both nostrils daily as needed for allergies or rhinitis (Spring and Fall).     ibuprofen (ADVIL,MOTRIN) 200 MG tablet Take 200 mg by mouth every 8 (eight) hours as needed for headache or moderate pain.     levothyroxine (SYNTHROID) 150 MCG tablet Take 150 mcg by mouth daily before breakfast.     LORazepam (ATIVAN) 0.5 MG tablet Take 1 tablet 30 minutes prior to radiation procedures or MRIs. 8 tablet 0   losartan (COZAAR) 100 MG tablet Take 100 mg by mouth daily.     nitroGLYCERIN (  NITROSTAT) 0.4 MG SL tablet PLACE 1 TABLET UNDER THE TONGUE EVERY 5 MINUTES AS NEEDED FOR CHEST PAIN 25 tablet 11   Omega-3 Fatty Acids (FISH OIL) 1000 MG CAPS Take 2 capsules by mouth in the morning and at bedtime.     pantoprazole (PROTONIX) 40 MG tablet Take 40 mg by mouth daily.     pravastatin (PRAVACHOL) 80 MG tablet Take 1 tablet (80 mg total) by mouth daily. Please keep scheduled appointment for future refills. Thank you. 30 tablet 2   tiZANidine (ZANAFLEX) 4 MG tablet Take 1 tablet (4 mg total) by mouth every 6 (six) hours as needed for muscle spasms. 30 tablet 0   zolpidem (AMBIEN) 10 MG tablet Take 10 mg by mouth at bedtime.  0   No current  facility-administered medications for this encounter.    REVIEW OF SYSTEMS:  Notable for that above.   PHYSICAL EXAM:  vitals were not taken for this visit.   General: Alert and oriented, in no acute distress *** HEENT: Head is normocephalic. Extraocular movements are intact. Oropharynx is clear. Neck: Neck is supple, no palpable cervical or supraclavicular lymphadenopathy. Heart: Regular in rate and rhythm with no murmurs, rubs, or gallops. Chest: Clear to auscultation bilaterally, with no rhonchi, wheezes, or rales. Abdomen: Soft, nontender, nondistended, with no rigidity or guarding. Extremities: No cyanosis or edema. Lymphatics: see Neck Exam Skin: No concerning lesions. Musculoskeletal: symmetric strength and muscle tone throughout. Neurologic: Cranial nerves II through XII are grossly intact. No obvious focalities. Speech is fluent. Coordination is intact. Psychiatric: Judgment and insight are intact. Affect is appropriate.   ECOG = ***  0 - Asymptomatic (Fully active, able to carry on all predisease activities without restriction)  1 - Symptomatic but completely ambulatory (Restricted in physically strenuous activity but ambulatory and able to carry out work of a light or sedentary nature. For example, light housework, office work)  2 - Symptomatic, <50% in bed during the day (Ambulatory and capable of all self care but unable to carry out any work activities. Up and about more than 50% of waking hours)  3 - Symptomatic, >50% in bed, but not bedbound (Capable of only limited self-care, confined to bed or chair 50% or more of waking hours)  4 - Bedbound (Completely disabled. Cannot carry on any self-care. Totally confined to bed or chair)  5 - Death   Eustace Pen MM, Creech RH, Tormey DC, et al. [phone]). "Toxicity and response criteria of the [facility]". [place] Oncol. 5 (6): 649-55   LABORATORY DATA:  Lab Results  Component Value Date   WBC 5.3  11/06/2021   HGB 15.2 11/06/2021   HCT 44.9 11/06/2021   MCV 89.6 11/06/2021   PLT 156 11/06/2021   CMP     Component Value Date/Time   NA 142 11/06/2021 0922   NA 143 04/24/2016 0935   K 3.8 11/06/2021 0922   CL 105 11/06/2021 0922   CO2 28 11/06/2021 0922   GLUCOSE 124 (H) 11/06/2021 0922   BUN 11 11/06/2021 0922   BUN 17 04/24/2016 0935   CREATININE 0.80 11/06/2021 0922   CREATININE 0.74 03/01/2021 0845   CALCIUM 9.4 11/06/2021 0922   PROT 6.8 03/13/2021 0753   ALBUMIN 5.0 (H) 03/13/2021 0753   AST 24 03/13/2021 0753   AST 21 03/01/2021 0845   ALT 18 03/13/2021 0753   ALT 18 03/01/2021 0845   ALKPHOS 77 03/13/2021 0753   BILITOT 0.9 03/13/2021 0753  BILITOT 0.8 03/01/2021 0845   GFRNONAA >60 11/06/2021 0922   GFRNONAA >60 03/01/2021 0845   GFRAA >60 08/03/2019 1301         RADIOGRAPHY: MR Lumbar Spine W Wo Contrast  Result Date: 01/04/2022 CLINICAL DATA:  77 year old male with history of lung cancer, L1 vertebral lesion with pathologic fracture. Status post percutaneous biopsy in April revealing aggressive hemangioma, subsequent open reduction internal fixation of L1 fracture last month by Neurosurgery. Postoperative radio surgery planning. EXAM: MRI LUMBAR SPINE WITHOUT AND WITH CONTRAST TECHNIQUE: Multiplanar and multiecho pulse sequences of the lumbar spine were obtained without and with intravenous contrast. CONTRAST:  7 mL Vueway COMPARISON:  Preoperative MRI 09/25/2021. Intraoperative radiographs 11/14/2021. FINDINGS: Initial 3 Tesla imaging attempt with excessive spinal hardware artifact obscuring detail of the posterior L1 body. Subsequently, the patient was taken to 1.5 Tesla MRI and we were able to acquire diagnostic images on that machine. Segmentation:  Normal, same numbering system used previously. Alignment: Lumbar lordosis not significantly changed since September. Vertebrae: L2 compression fracture, more so with superior endplate deformity. Stable loss of  height since 09/25/2021. Posterior retropulsion of the body/underlying hemangioma also appears stable since September (series 3, image 8) and is demonstrated on thin slice axial images (series 4 and series 6) to be 43 mm AP by 46 mm transverse. From the L1 vertebral body the lesion more closely encroaches on the left pedicle seen on series 4, image 31 and series 3, image 12. Right pedicle appears more spared. No other involvement of the residual L1 posterior elements is identified. But there is a separate right paraspinallesion (series 4, images 29 and 30) which is 24 x 14 mm AP by transverse and isointense to the L1 hemangioma by MRI. This also appears stable in size and configuration since September and tracks anteriorly and inferiorly a distance of about 4.5 cm as seen on series 3 image 2. However, this is non-ossified by CT, PET-CT. No new osseous abnormality identified. T11 through L3 bilateral pedicle screw susceptibility artifact. Conus medullaris and cauda equina: Conus detail obscured by hardware artifact today, previously present at the L1 level. Paraspinal and other soft tissues: Right paraspinal satellite lesion as detailed above. Posterior paraspinal postoperative changes. Otherwise stable visible abdominal viscera and paraspinal soft tissues Disc levels: Stable, including AP spinal canal at the L1 retropulsion level which measures 9 mm. Chronic L5 disc and endplate degeneration. IMPRESSION: 1. Study for stereotactic radiosurgery planning. 2. Postop posterior spinal fusion from T11 through L3 with excessive hardware artifact on attempted 3 Tesla imaging, but diagnostic 1.5 Tesla MRI. 3. Stable configuration of L1 Aggressive Hemangioma and subsequent pathologic compression fracture since September MRI. And stable adjacent right paraspinal lesion which appears isointense to the hemangioma by MRI, but is non-ossified by CT and PET-CT. Electronically Signed   By: Genevie Ann M.D.   On: 01/04/2022 11:17   US  PELVIS LIMITED (TRANSABDOMINAL ONLY)  Result Date: 12/15/2021 CLINICAL DATA:  Right groin pain and swelling. EXAM: ULTRASOUND OF RIGHT GROIN SOFT TISSUES TECHNIQUE: Ultrasound examination of the groin soft tissues was performed in the area of clinical concern. COMPARISON:  PET-CT dated 09/26/2020 FINDINGS: When imaged upright, a bowel loop is demonstrated in the right inguinal canal, not seen when the patient is lying down. No mass or fluid collection seen. IMPRESSION: Right inguinal hernia containing a bowel loop, only seen when the patient is standing. Electronically Signed   By: Claudie Revering M.D.   On: 12/15/2021 21:53  IMPRESSION/PLAN:***    On date of service, in total, I spent *** minutes on this encounter. Patient was seen in person.   __________________________________________   Eppie Gibson, MD  This document serves as a record of services personally performed by Eppie Gibson, MD. It was created on her behalf by Roney Mans, a trained medical scribe. The creation of this record is based on the scribe's personal observations and the provider's statements to them. This document has been checked and approved by the attending provider.

## 2022-01-09 ENCOUNTER — Other Ambulatory Visit: Payer: Self-pay | Admitting: Cardiology

## 2022-01-09 ENCOUNTER — Encounter: Payer: Self-pay | Admitting: Radiation Oncology

## 2022-01-09 ENCOUNTER — Ambulatory Visit
Admission: RE | Admit: 2022-01-09 | Discharge: 2022-01-09 | Disposition: A | Discharge status: Home / Self Care | Payer: Medicare Other | Source: Ambulatory Visit | Attending: Radiation Oncology | Admitting: Radiation Oncology

## 2022-01-09 VITALS — BP 127/72 | HR 70 | Temp 98.0°F | Resp 18 | Ht 71.0 in | Wt 157.8 lb

## 2022-01-09 DIAGNOSIS — C3412 Malignant neoplasm of upper lobe, left bronchus or lung: Secondary | ICD-10-CM | POA: Diagnosis not present

## 2022-01-09 DIAGNOSIS — G8929 Other chronic pain: Secondary | ICD-10-CM | POA: Insufficient documentation

## 2022-01-09 DIAGNOSIS — D1809 Hemangioma of other sites: Secondary | ICD-10-CM | POA: Diagnosis not present

## 2022-01-09 DIAGNOSIS — Z87891 Personal history of nicotine dependence: Secondary | ICD-10-CM | POA: Insufficient documentation

## 2022-01-09 DIAGNOSIS — M8448XA Pathological fracture, other site, initial encounter for fracture: Secondary | ICD-10-CM | POA: Insufficient documentation

## 2022-01-09 DIAGNOSIS — Z510 Encounter for antineoplastic radiation therapy: Secondary | ICD-10-CM | POA: Insufficient documentation

## 2022-01-09 DIAGNOSIS — Z85118 Personal history of other malignant neoplasm of bronchus and lung: Secondary | ICD-10-CM | POA: Insufficient documentation

## 2022-01-09 MED ORDER — DEXAMETHASONE 4 MG PO TABS: ORAL_TABLET | ORAL | 0 refills | Status: DC

## 2022-01-09 MED ORDER — PROMETHAZINE HCL 25 MG PO TABS: 25.0000 mg | ORAL_TABLET | Freq: Four times a day (QID) | ORAL | 1 refills | Status: DC | PRN

## 2022-01-09 MED ORDER — ONDANSETRON HCL 8 MG PO TABS: 8.0000 mg | ORAL_TABLET | Freq: Three times a day (TID) | ORAL | 1 refills | Status: DC | PRN

## 2022-01-11 DIAGNOSIS — K4090 Unilateral inguinal hernia, without obstruction or gangrene, not specified as recurrent: Secondary | ICD-10-CM | POA: Diagnosis not present

## 2022-01-12 DIAGNOSIS — Z510 Encounter for antineoplastic radiation therapy: Secondary | ICD-10-CM | POA: Diagnosis not present

## 2022-01-12 DIAGNOSIS — G8929 Other chronic pain: Secondary | ICD-10-CM | POA: Diagnosis not present

## 2022-01-12 DIAGNOSIS — D1809 Hemangioma of other sites: Secondary | ICD-10-CM | POA: Diagnosis not present

## 2022-01-12 DIAGNOSIS — C3412 Malignant neoplasm of upper lobe, left bronchus or lung: Secondary | ICD-10-CM | POA: Diagnosis not present

## 2022-01-12 DIAGNOSIS — M8448XA Pathological fracture, other site, initial encounter for fracture: Secondary | ICD-10-CM | POA: Diagnosis not present

## 2022-01-12 DIAGNOSIS — Z87891 Personal history of nicotine dependence: Secondary | ICD-10-CM | POA: Diagnosis not present

## 2022-01-15 ENCOUNTER — Ambulatory Visit
Admission: RE | Admit: 2022-01-15 | Discharge: 2022-01-15 | Disposition: A | Discharge status: Home / Self Care | Payer: Medicare Other | Source: Ambulatory Visit | Attending: Radiation Oncology | Admitting: Radiation Oncology

## 2022-01-15 ENCOUNTER — Other Ambulatory Visit: Payer: Self-pay

## 2022-01-15 DIAGNOSIS — C3412 Malignant neoplasm of upper lobe, left bronchus or lung: Secondary | ICD-10-CM | POA: Diagnosis not present

## 2022-01-15 DIAGNOSIS — Z87891 Personal history of nicotine dependence: Secondary | ICD-10-CM | POA: Diagnosis not present

## 2022-01-15 DIAGNOSIS — M8448XA Pathological fracture, other site, initial encounter for fracture: Secondary | ICD-10-CM | POA: Diagnosis not present

## 2022-01-15 DIAGNOSIS — G8929 Other chronic pain: Secondary | ICD-10-CM | POA: Diagnosis not present

## 2022-01-15 DIAGNOSIS — D1809 Hemangioma of other sites: Secondary | ICD-10-CM | POA: Diagnosis not present

## 2022-01-15 DIAGNOSIS — Z510 Encounter for antineoplastic radiation therapy: Secondary | ICD-10-CM | POA: Diagnosis not present

## 2022-01-15 LAB — RAD ONC ARIA SESSION SUMMARY
Course Elapsed Days: 0
Plan Fractions Treated to Date: 1
Plan Prescribed Dose Per Fraction: 6 Gy
Plan Total Fractions Prescribed: 5
Plan Total Prescribed Dose: 30 Gy
Reference Point Dosage Given to Date: 6 Gy
Reference Point Session Dosage Given: 6 Gy
Session Number: 1

## 2022-01-15 NOTE — Op Note (Signed)
   Name: Miguel Wolfe  MRN: [id number]  Date: 01/15/2022   DOB: 12/31/1945  Stereotactic Radiosurgery Operative Note  PRE-OPERATIVE DIAGNOSIS:  L1 Hemangioma  POST-OPERATIVE DIAGNOSIS:  Same  PROCEDURE:  Stereotactic Radiosurgery  RADIATION ONCOLOGIST: Dr. Eppie Gibson, MD  SURGEON:  Jairo Ben, MD  NARRATIVE: The patient underwent a radiation treatment planning session in the radiation oncology simulation suite under the care of the radiation oncology physician and physicist.  I participated closely in the radiation treatment planning afterwards. The patient underwent planning MRI.  Radiation oncology contoured the gross target volume and subsequently expanded this to yield the Planning Target Volume. I actively participated in the planning process.  I helped to define and review the target contours and also the contours of the spinal cord, and selected nearby organs at risk.  All the dose constraints for critical structures were reviewed and compared to AAPM Task Group 101.  The prescription dose conformity was reviewed.  I approved the plan electronically.    Accordingly, [name] was brought to the TrueBeam stereotactic radiation treatment linac and placed in the custom immobilization device.  The patient was aligned according to the IR fiducial markers with BrainLab Exactrac, then orthogonal x-rays were used in ExacTrac with the 6DOF robotic table and the shifts were made to align the patient.  Then conebeam CT was performed to verify precision.  [name] received the first of five planned fractions of stereotactic radiosurgery to L1 with a planned total 30Gy dose uneventfully.  The detailed description of the procedure is recorded in the radiation oncology procedure note.  I was present for the duration of the procedure.  DISPOSITION:  Following delivery, the patient was transported to nursing in stable condition and monitored for possible acute effects to be discharged  to home in stable condition with follow-up in one month.  Jairo Ben, MD 01/15/2022 12:53 PM

## 2022-01-15 NOTE — Progress Notes (Incomplete)
Nurse monitoring complete status post SRS treatment. Patient without complaints. Pt was monitored for 30 minutes after his treatment. Patient denies new or worsening neurologic symptoms. Vitals stable. Instructed patient to avoid strenuous activity for the next 24 hours. . Instructed patient to call [phone] with needs related to treatment after hours or over the weekend. Patient verbalized understanding   Vitals:   01/15/22 1302  BP: 129/75  Pulse: 71  Resp: 18  Temp: 98.1 F (36.7 C)  SpO2: 100%    Pt ambulated out of clinic without difficulty. No needs at time of d/c from clinic.

## 2022-01-16 ENCOUNTER — Telehealth: Payer: Self-pay | Admitting: *Deleted

## 2022-01-16 NOTE — Telephone Encounter (Signed)
   Name: Miguel Wolfe  DOB: [birth year]  MRN: [id number]  Primary Cardiologist: Candee Furbish, MD  Chart reviewed as part of pre-operative protocol coverage. Because of [name] past medical history and time since last visit, he will require a follow-up telephone visit in order to better assess preoperative cardiovascular risk.  Pre-op covering staff: - Please schedule appointment and call patient to inform them. If patient already had an upcoming appointment within acceptable timeframe, please add "pre-op clearance" to the appointment notes so provider is aware. - Please contact requesting surgeon's office via preferred method (i.e, phone, fax) to inform them of need for appointment prior to surgery.  Patient has a history of DES to his RCA back in 2018.  Okay to hold aspirin x 7 days prior to the procedure and restart when medically safe to do so as long as patient does not have any symptoms during phone call appointment.  Elgie Collard, PA-C  01/16/2022, 3:36 PM

## 2022-01-16 NOTE — Telephone Encounter (Signed)
   Pre-operative Risk Assessment    Patient Name: Miguel Wolfe  DOB: [birth year] MRN: [id number]     Request for Surgical Clearance    Procedure:   INGUINAL HERNIA   Date of Surgery:  Clearance TBD                                 Surgeon: Gurney Maxin  Surgeon's Group or Practice Name:  [facility]  Phone number:  [phone] Fax number:  [phone] ATTN KELSEY PHILLIPS    Type of Clearance Requested:   - Pharmacy:  Hold Aspirin PRIOR TO SURGERY    Type of Anesthesia:  General  {  Claude Manges   01/16/2022, 2:02 PM

## 2022-01-17 ENCOUNTER — Telehealth: Payer: Self-pay | Admitting: *Deleted

## 2022-01-17 ENCOUNTER — Other Ambulatory Visit: Payer: Self-pay | Admitting: Thoracic Surgery (Cardiothoracic Vascular Surgery)

## 2022-01-17 ENCOUNTER — Other Ambulatory Visit: Payer: Self-pay

## 2022-01-17 ENCOUNTER — Ambulatory Visit
Admission: RE | Admit: 2022-01-17 | Discharge: 2022-01-17 | Disposition: A | Discharge status: Home / Self Care | Payer: Medicare Other | Source: Ambulatory Visit | Attending: Radiation Oncology | Admitting: Radiation Oncology

## 2022-01-17 DIAGNOSIS — D1809 Hemangioma of other sites: Secondary | ICD-10-CM | POA: Diagnosis not present

## 2022-01-17 DIAGNOSIS — Z510 Encounter for antineoplastic radiation therapy: Secondary | ICD-10-CM | POA: Diagnosis not present

## 2022-01-17 DIAGNOSIS — G8929 Other chronic pain: Secondary | ICD-10-CM | POA: Diagnosis not present

## 2022-01-17 DIAGNOSIS — C3412 Malignant neoplasm of upper lobe, left bronchus or lung: Secondary | ICD-10-CM | POA: Diagnosis not present

## 2022-01-17 DIAGNOSIS — Z87891 Personal history of nicotine dependence: Secondary | ICD-10-CM | POA: Diagnosis not present

## 2022-01-17 DIAGNOSIS — M8448XA Pathological fracture, other site, initial encounter for fracture: Secondary | ICD-10-CM | POA: Diagnosis not present

## 2022-01-17 LAB — RAD ONC ARIA SESSION SUMMARY
Course Elapsed Days: 2
Plan Fractions Treated to Date: 2
Plan Prescribed Dose Per Fraction: 6 Gy
Plan Total Fractions Prescribed: 5
Plan Total Prescribed Dose: 30 Gy
Reference Point Dosage Given to Date: 12 Gy
Reference Point Session Dosage Given: 6 Gy
Session Number: 2

## 2022-01-17 NOTE — Progress Notes (Signed)
Nurse monitoring complete status post SRS treatment. Patient without complaints. Patient denies new or worsening neurologic symptoms. Vitals stable. Pt was monitored for 15 minutes. Pt was stable throughout monitoring.  Instructed patient to avoid strenuous activity for the next 24 hours. Instructed patient to call [phone] with needs related to treatment after hours or over the weekend. Patient verbalized understanding. Pt ambulated out of clinic without complication.   Vitals:   01/17/22 1358  BP: 120/68  Pulse: 63  Resp: 18  Temp: 97.7 F (36.5 C)  SpO2: 99%

## 2022-01-17 NOTE — Telephone Encounter (Signed)
  Patient Consent for Virtual Visit         Miguel Wolfe has provided verbal consent on 01/17/2022 for a virtual visit (video or telephone).   CONSENT FOR VIRTUAL VISIT FOR:  Miguel Wolfe  By participating in this virtual visit I agree to the following:  I hereby voluntarily request, consent and authorize [facility] and its employed or contracted physicians, physician assistants, nurse practitioners or other licensed health care professionals (the Practitioner), to provide me with telemedicine health care services (the "Services") as deemed necessary by the treating Practitioner. I acknowledge and consent to receive the Services by the Practitioner via telemedicine. I understand that the telemedicine visit will involve communicating with the Practitioner through live audiovisual communication technology and the disclosure of certain medical information by electronic transmission. I acknowledge that I have been given the opportunity to request an in-person assessment or other available alternative prior to the telemedicine visit and am voluntarily participating in the telemedicine visit.  I understand that I have the right to withhold or withdraw my consent to the use of telemedicine in the course of my care at any time, without affecting my right to future care or treatment, and that the Practitioner or I may terminate the telemedicine visit at any time. I understand that I have the right to inspect all information obtained and/or recorded in the course of the telemedicine visit and may receive copies of available information for a reasonable fee.  I understand that some of the potential risks of receiving the Services via telemedicine include:  Delay or interruption in medical evaluation due to technological equipment failure or disruption; Information transmitted may not be sufficient (e.g. poor resolution of images) to allow for appropriate medical decision making by the  Practitioner; and/or  In rare instances, security protocols could fail, causing a breach of personal health information.  Furthermore, I acknowledge that it is my responsibility to provide information about my medical history, conditions and care that is complete and accurate to the best of my ability. I acknowledge that Practitioner's advice, recommendations, and/or decision may be based on factors not within their control, such as incomplete or inaccurate data provided by me or distortions of diagnostic images or specimens that may result from electronic transmissions. I understand that the practice of medicine is not an exact science and that Practitioner makes no warranties or guarantees regarding treatment outcomes. I acknowledge that a copy of this consent can be made available to me via my patient portal ([facility]), or I can request a printed copy by calling the office of [facility].    I understand that my insurance will be billed for this visit.   I have read or had this consent read to me. I understand the contents of this consent, which adequately explains the benefits and risks of the Services being provided via telemedicine.  I have been provided ample opportunity to ask questions regarding this consent and the Services and have had my questions answered to my satisfaction. I give my informed consent for the services to be provided through the use of telemedicine in my medical care

## 2022-01-18 NOTE — Progress Notes (Signed)
Virtual Visit via Telephone Note   Because of Miguel Wolfe's co-morbid illnesses, he is at least at moderate risk for complications without adequate follow up.  This format is felt to be most appropriate for this patient at this time.  The patient did not have access to video technology/had technical difficulties with video requiring transitioning to audio format only (telephone).  All issues noted in this document were discussed and addressed.  No physical exam could be performed with this format.  Please refer to the patient's chart for his consent to telehealth for [facility].  Evaluation Performed:  Preoperative cardiovascular risk assessment _____________   Date:  01/18/2022   Patient ID:  Miguel Wolfe, DOB 1945/06/12, MRN [id number] Patient Location:  Home Provider location:   Office  Primary Care Provider:  [name], MD Primary Cardiologist:  Donato Schultz, MD  Chief Complaint / Patient Profile   77 y.o. y/o male with a h/o CAD s/p DES to RCA 02/2016, thyroid cancer, lung cancer s/p left upper lobectomy 2020, mild to moderate AI, mild dilatation of aortic root, HTN, HLD who is pending inguinal hernia repair and presents today for telephonic preoperative cardiovascular risk assessment.  History of Present Illness    Miguel Wolfe is a 77 y.o. male who presents via audio/video conferencing for a telehealth visit today.  Pt was last seen in cardiology clinic on 08/07/21 by Dr. Anne Fu.  At that time Miguel Wolfe was doing well.  The patient is now pending procedure as outlined above. Since his last visit, he  denies chest pain, shortness of breath, lower extremity edema, fatigue, palpitations, melena, hematuria, hemoptysis, diaphoresis, weakness, presyncope, syncope, orthopnea, and PND. He is about 9 weeks post back surgery but is walking for exercise on a consistent basis without concerning cardiac symptoms.    Past Medical History    Past Medical History:   Diagnosis Date   Anxiety    with MRIs (or closed spaces)   Aortic insufficiency    Arthritis    CAD (coronary artery disease)    Colon polyps    Family history of adverse reaction to anesthesia    Mom vomits and Dad went "nuts"   Family history of colon cancer    Family history of stomach cancer    Fatigue    GERD (gastroesophageal reflux disease)    Hemorrhoids    History of echocardiogram    Echo 02/08/16: Mild LVH, EF 55-60, no RWMA, Gr 1 DD, trivial AI, mild LAE   Hyperlipidemia    Hypertension    Lung cancer (HCC)    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   PONV (postoperative nausea and vomiting)    Pre-diabetes    Pulmonary nodules    Thyroid cancer (HCC)    thyroid removed   Past Surgical History:  Procedure Laterality Date   CAROTID ENDARTERECTOMY     COLONOSCOPY WITH ESOPHAGOGASTRODUODENOSCOPY (EGD)  2008   CORONARY STENT INTERVENTION N/A 02/20/2016   Procedure: Coronary Stent Intervention;  Surgeon: Kathleene Hazel, MD;  Location: [facility] INVASIVE CV LAB;  Service: Cardiovascular;  Laterality: N/A;   ESOPHAGOGASTRODUODENOSCOPY     LEFT HEART CATH AND CORONARY ANGIOGRAPHY N/A 02/20/2016   Procedure: Left Heart Cath and Coronary Angiography;  Surgeon: Kathleene Hazel, MD;  Location: [facility] INVASIVE CV LAB;  Service: Cardiovascular;  Laterality: N/A;   LUNG REMOVAL, PARTIAL Left 06/24/2018   Pt states "1/2 of lung was removed"   PILONIDAL CYST EXCISION  REFRACTIVE SURGERY Right    retina tear repair   THYROIDECTOMY N/A 01/17/2018   Procedure: TOTAL THYROIDECTOMY;  Surgeon: Armandina Gemma, MD;  Location: [facility] ORS;  Service: General;  Laterality: N/A;   VIDEO ASSISTED THORACOSCOPY (VATS)/WEDGE RESECTION Left 06/24/2018   Procedure: VIDEO ASSISTED THORACOSCOPY (VATS)/LUNG RESECTION;  Surgeon: Grace Isaac, MD;  Location: [facility];  Service: Thoracic;  Laterality: Left;   VIDEO BRONCHOSCOPY WITH ENDOBRONCHIAL NAVIGATION N/A 11/18/2017   Procedure: VIDEO BRONCHOSCOPY WITH  ENDOBRONCHIAL NAVIGATION WITH BIOPSIES OF LEFT AND RIGHT UPPER LOBES;  Surgeon: Grace Isaac, MD;  Location: [facility];  Service: Thoracic;  Laterality: N/A;   VIDEO BRONCHOSCOPY WITH ENDOBRONCHIAL NAVIGATION N/A 06/11/2018   Procedure: VIDEO BRONCHOSCOPY WITH ENDOBRONCHIAL NAVIGATION;  Surgeon: Grace Isaac, MD;  Location: [facility];  Service: Thoracic;  Laterality: N/A;    Allergies  Allergies  Allergen Reactions   Gemfibrozil Other (See Comments)    Muscle pain   Propranolol     Other reaction(s): SOB  Only had issue with Slow release propranolol   Niaspan [Niacin Er] Rash and Other (See Comments)    Flushing - aspirin did not mitigate    Simvastatin Other (See Comments)    Drug-Drug interaction with amlodipine    Home Medications    Prior to Admission medications   Medication Sig Start Date End Date Taking? Authorizing Provider  acetaminophen (TYLENOL) 325 MG tablet Take 650 mg by mouth every 6 (six) hours as needed for moderate pain or headache.     [provider]  amLODipine (NORVASC) 10 MG tablet Take 10 mg by mouth daily. 12/16/20   [provider]  aspirin EC 81 MG tablet Take 81 mg by mouth daily.    [provider]  carvedilol (COREG) 6.25 MG tablet Take 1 tablet (6.25 mg total) by mouth 2 (two) times daily with a meal. 02/10/21   Dunn, Dayna N, PA-C  Coenzyme Q10 (COQ10) 100 MG CAPS Take 100 mg by mouth daily.    [provider]  dexamethasone (DECADRON) 4 MG tablet Take 2 tablets in the AM on 1/8, 1/9, and 1/10. Take 1 tablet in the AM on 1/11 - 1/17. Take half a tablet in the AM on 1/18 - 1/21. Take with food. 01/09/22   Marlynn Perking, PA-C  ezetimibe (ZETIA) 10 MG tablet TAKE 1 TABLET(10 MG) BY MOUTH DAILY 10/16/21   Jerline Pain, MD  famotidine (PEPCID) 20 MG tablet Take 20 mg by mouth daily.    [provider]  fluticasone Asencion Islam) 50 MCG/ACT nasal spray Place 1 spray into both nostrils daily as needed for allergies or  rhinitis (Spring and Fall).    [provider]  ibuprofen (ADVIL,MOTRIN) 200 MG tablet Take 200 mg by mouth every 8 (eight) hours as needed for headache or moderate pain.    [provider]  levothyroxine (SYNTHROID) 150 MCG tablet Take 150 mcg by mouth daily before breakfast.    [provider]  LORazepam (ATIVAN) 0.5 MG tablet Take 1 tablet 30 minutes prior to radiation procedures or MRIs. 10/03/21   Eppie Gibson, MD  losartan (COZAAR) 100 MG tablet Take 100 mg by mouth daily.    [provider]  nitroGLYCERIN (NITROSTAT) 0.4 MG SL tablet PLACE 1 TABLET UNDER THE TONGUE EVERY 5 MINUTES AS NEEDED FOR CHEST PAIN 08/08/21   Jerline Pain, MD  Omega-3 Fatty Acids (FISH OIL) 1000 MG CAPS Take 2 capsules by mouth in the morning and at  bedtime.    [provider]  ondansetron (ZOFRAN) 8 MG tablet Take 1 tablet (8 mg total) by mouth every 8 (eight) hours as needed for nausea or vomiting. 01/09/22   Erven Colla, PA-C  oxyCODONE (OXY IR/ROXICODONE) 5 MG immediate release tablet Take 5 mg by mouth every 6 (six) hours as needed for severe pain. 11/21/21   [provider]  pantoprazole (PROTONIX) 40 MG tablet Take 40 mg by mouth daily. 01/19/20   [provider]  pravastatin (PRAVACHOL) 80 MG tablet TAKE 1 TABLET(80 MG) BY MOUTH DAILY 01/09/22   Jake Bathe, MD  promethazine (PHENERGAN) 25 MG tablet Take 1 tablet (25 mg total) by mouth every 6 (six) hours as needed for nausea or vomiting. 01/09/22   Erven Colla, PA-C  zolpidem (AMBIEN) 10 MG tablet Take 10 mg by mouth at bedtime. 04/29/17   [provider]    Physical Exam    Vital Signs:  Miguel Wolfe does not have vital signs available for review today.  Given telephonic nature of communication, physical exam is limited. AAOx3. NAD. Normal affect.  Speech and respirations are unlabored.  Accessory Clinical Findings    None  Assessment & Plan    1.  Preoperative Cardiovascular  Risk Assessment:The patient is doing well from a cardiac perspective. Therefore, based on ACC/AHA guidelines, the patient would be at acceptable risk for the planned procedure without further cardiovascular testing. According to the Revised Cardiac Risk Index (RCRI), his Perioperative Risk of Major Cardiac Event is (%): 0.9 His Functional Capacity in METs is: 6.05 according to the [place] Activity Status Index (DASI).   The patient was advised that if he develops new symptoms prior to surgery to contact our office to arrange for a follow-up visit, and he verbalized understanding.  Per office protocol, he may hold aspirin for 5-7 days prior to procedure and should resume as soon as hemodynamically stable postoperatively.   A copy of this note will be routed to requesting surgeon.  Time:   Today, I have spent 7 minutes with the patient with telehealth technology discussing medical history, symptoms, and management plan.    Levi Aland, NP-C  01/19/2022, 9:57 AM [address] Office [phone] Fax [phone]

## 2022-01-19 ENCOUNTER — Other Ambulatory Visit: Payer: Self-pay

## 2022-01-19 ENCOUNTER — Ambulatory Visit
Admission: RE | Admit: 2022-01-19 | Discharge: 2022-01-19 | Disposition: A | Discharge status: Home / Self Care | Payer: Medicare Other | Source: Ambulatory Visit | Attending: Radiation Oncology | Admitting: Radiation Oncology

## 2022-01-19 ENCOUNTER — Encounter: Payer: Self-pay | Admitting: Nurse Practitioner

## 2022-01-19 ENCOUNTER — Ambulatory Visit: Payer: Medicare Other | Attending: Cardiovascular Disease | Admitting: Nurse Practitioner

## 2022-01-19 DIAGNOSIS — D1809 Hemangioma of other sites: Secondary | ICD-10-CM | POA: Diagnosis not present

## 2022-01-19 DIAGNOSIS — Z01810 Encounter for preprocedural cardiovascular examination: Secondary | ICD-10-CM

## 2022-01-19 DIAGNOSIS — C3412 Malignant neoplasm of upper lobe, left bronchus or lung: Secondary | ICD-10-CM | POA: Diagnosis not present

## 2022-01-19 DIAGNOSIS — Z87891 Personal history of nicotine dependence: Secondary | ICD-10-CM | POA: Diagnosis not present

## 2022-01-19 DIAGNOSIS — G8929 Other chronic pain: Secondary | ICD-10-CM | POA: Diagnosis not present

## 2022-01-19 DIAGNOSIS — Z510 Encounter for antineoplastic radiation therapy: Secondary | ICD-10-CM | POA: Diagnosis not present

## 2022-01-19 DIAGNOSIS — M8448XA Pathological fracture, other site, initial encounter for fracture: Secondary | ICD-10-CM | POA: Diagnosis not present

## 2022-01-19 LAB — RAD ONC ARIA SESSION SUMMARY
Course Elapsed Days: 4
Plan Fractions Treated to Date: 3
Plan Prescribed Dose Per Fraction: 6 Gy
Plan Total Fractions Prescribed: 5
Plan Total Prescribed Dose: 30 Gy
Reference Point Dosage Given to Date: 18 Gy
Reference Point Session Dosage Given: 6 Gy
Session Number: 3

## 2022-01-19 NOTE — Progress Notes (Signed)
Nurse monitoring complete status post SRS treatment. Patient without complaints. Patient denies new or worsening neurologic symptoms. Vitals stable. Instructed patient to avoid strenuous activity for the next 24 hours. Instructed patient to call [phone] with needs related to treatment after hours or over the weekend. Patient verbalized understanding   Vitals:   01/19/22 1348  BP: 138/80  Pulse: 66  Resp: 18  Temp: (!) 97.3 F (36.3 C)  SpO2: 98%   Pt was monitored for 15 minutes without complication. Pt ambulated out of clinic without complication. No needs at time of d/c from clinic.

## 2022-01-22 ENCOUNTER — Other Ambulatory Visit: Payer: Self-pay | Admitting: Radiation Therapy

## 2022-01-22 ENCOUNTER — Other Ambulatory Visit: Payer: Self-pay

## 2022-01-22 ENCOUNTER — Ambulatory Visit
Admission: RE | Admit: 2022-01-22 | Discharge: 2022-01-22 | Disposition: A | Discharge status: Home / Self Care | Payer: Medicare Other | Source: Ambulatory Visit | Attending: Radiation Oncology | Admitting: Radiation Oncology

## 2022-01-22 DIAGNOSIS — G8929 Other chronic pain: Secondary | ICD-10-CM | POA: Diagnosis not present

## 2022-01-22 DIAGNOSIS — C3412 Malignant neoplasm of upper lobe, left bronchus or lung: Secondary | ICD-10-CM | POA: Diagnosis not present

## 2022-01-22 DIAGNOSIS — M8448XA Pathological fracture, other site, initial encounter for fracture: Secondary | ICD-10-CM | POA: Diagnosis not present

## 2022-01-22 DIAGNOSIS — Z87891 Personal history of nicotine dependence: Secondary | ICD-10-CM | POA: Diagnosis not present

## 2022-01-22 DIAGNOSIS — Z510 Encounter for antineoplastic radiation therapy: Secondary | ICD-10-CM | POA: Diagnosis not present

## 2022-01-22 DIAGNOSIS — D1809 Hemangioma of other sites: Secondary | ICD-10-CM

## 2022-01-22 LAB — RAD ONC ARIA SESSION SUMMARY
Course Elapsed Days: 7
Plan Fractions Treated to Date: 4
Plan Prescribed Dose Per Fraction: 6 Gy
Plan Total Fractions Prescribed: 5
Plan Total Prescribed Dose: 30 Gy
Reference Point Dosage Given to Date: 24 Gy
Reference Point Session Dosage Given: 6 Gy
Session Number: 4

## 2022-01-22 NOTE — Progress Notes (Signed)
Nurse monitoring complete status post SRS treatment. Patient without complaints. Patient denies new or worsening neurologic symptoms. Vitals stable. Instructed patient to avoid strenuous activity for the next 24 hours. (Instructed patient to not miss any of her decadron doses, pt takes 2mg  at this time). Instructed patient to call [phone] with needs related to treatment after hours or over the weekend. Patient verbalized understanding. Pt was monitored for 15 minutes post SRS.    Vitals:   01/22/22 0947  BP: 133/79  Pulse: 71  Resp: 20  Temp: 97.7 F (36.5 C)  SpO2: 99%   Pt was able to ambulate out of clinic without complication. Dr. 01/24/22 to see pt and answered pt questions prior to pt d/c from clinic.

## 2022-01-24 ENCOUNTER — Ambulatory Visit: Payer: Medicare Other | Admitting: Neurology

## 2022-01-24 ENCOUNTER — Ambulatory Visit
Admission: RE | Admit: 2022-01-24 | Discharge: 2022-01-24 | Disposition: A | Discharge status: Home / Self Care | Payer: Medicare Other | Source: Ambulatory Visit | Attending: Radiation Oncology | Admitting: Radiation Oncology

## 2022-01-24 ENCOUNTER — Other Ambulatory Visit: Payer: Self-pay

## 2022-01-24 DIAGNOSIS — Z87891 Personal history of nicotine dependence: Secondary | ICD-10-CM | POA: Diagnosis not present

## 2022-01-24 DIAGNOSIS — C3412 Malignant neoplasm of upper lobe, left bronchus or lung: Secondary | ICD-10-CM | POA: Diagnosis not present

## 2022-01-24 DIAGNOSIS — G8929 Other chronic pain: Secondary | ICD-10-CM | POA: Diagnosis not present

## 2022-01-24 DIAGNOSIS — M8448XA Pathological fracture, other site, initial encounter for fracture: Secondary | ICD-10-CM | POA: Diagnosis not present

## 2022-01-24 DIAGNOSIS — D1809 Hemangioma of other sites: Secondary | ICD-10-CM | POA: Diagnosis not present

## 2022-01-24 DIAGNOSIS — Z510 Encounter for antineoplastic radiation therapy: Secondary | ICD-10-CM | POA: Diagnosis not present

## 2022-01-24 LAB — RAD ONC ARIA SESSION SUMMARY
Course Elapsed Days: 9
Plan Fractions Treated to Date: 5
Plan Prescribed Dose Per Fraction: 6 Gy
Plan Total Fractions Prescribed: 5
Plan Total Prescribed Dose: 30 Gy
Reference Point Dosage Given to Date: 30 Gy
Reference Point Session Dosage Given: 6 Gy
Session Number: 5

## 2022-01-24 NOTE — Progress Notes (Signed)
Nurse monitoring complete status post SRS treatments. Patient without complaints. Patient denies new or worsening neurologic symptoms. Vitals stable. Instructed patient to avoid strenuous activity for the next 24 hours. (Instructed patient to not miss any of his decadron doses). His current dose is 2mg . Instructed patient to call [phone] with needs related to treatment after hours or over the weekend. Patient verbalized understanding   Vitals:   01/24/22 1231  BP: 130/77  Pulse: 63  Resp: 18  Temp: 97.6 F (36.4 C)  SpO2: 100%    Pt able to walk of clinic without difficulty. Pt stable throughout time of 15 minute monitoring.

## 2022-01-25 NOTE — Progress Notes (Addendum)
NEUROLOGY FOLLOW UP OFFICE NOTE  Miguel Wolfe [id number]  Assessment/Plan:   New onset persistent left greater than right posterior headache, resolved  Repeat MRI of brain with and without contrast in March to follow up on findings. Further recommendations pending results.  Otherwise, follow up as needed  03/26/2022 ADDENDUM:  MRI of brain with and without contrast on 03/24/2022 personally reviewed demonstrates stable left parafalcine lesion with punctate contrast ehnancement and hyperintense T2-weighted signal favored to be benign.  Therefore, further routine follow up imaging is not felt to be indicated at this time.  Follow up as needed.  Subjective:  Miguel Wolfe is a 77 year old male with CAD, HTN, HLD, pre-diabetes, hypothyroidism, arthritis and history of non-small cell lung cancer who follows up for headache.  Accompanied by his wife who supplements history.  UPDATE: MRI of brain with and without contrast on 09/14/2021 personally reviewed again revealed a 1.1 cm focus of extra-axial FLAIR signal abnormality along the left aspect of the falx near the vertex, stable compared to prior imaging in 2020 but now noted a new small foci of enhancement within the lesion are new.  Given the stability in size over 3 years, finding still favored to be benign.  Persistent headache resolved in October.    Current NSAIDS/analgesics:  Tylenol, Advil PRN, ASA 81mg  daily Current triptans:  none Current ergotamine:  none Current anti-emetic:  none Current muscle relaxants:  none Current Antihypertensive medications:  amlodipine, carvedilol, losartan Current Antidepressant medications:  none Current Anticonvulsant medications:  none Current anti-CGRP:  none Current Vitamins/Herbal/Supplements:  none Current Antihistamines/Decongestants:  none Other therapy:  none Hormone/birth control:  none Other medications:  Ambien, levothyroxine, NTG   Caffeine:  1.5 cups of coffee daily. Alcohol:   2 drinks (scotch or wine) daily Diet:  Water.  No soda.  Exercise:  walks.    HISTORY: In late June 2023, he had a tooth filled.  A couple of weeks later, he began having a daily headache.  Described as a dull non-throbbing pain in the left parietal and occipital region but can then radiate.  Gets worse later in day.  Sometimes associated with mild nausea but no vomiting, photophobia, phonophobia or visual disturbance.  He doesn't wake up with the headache, usually starts around 10 AM.  Takes 1 Tylenol in morning and sometimes later Advil in afternoon.  Takes Tylenol 5 days a week and Advil 2 days a week.  Lasts sometimes after dinner for a couple of hours but otherwise until he goes to sleep.  He reports some neck stiffness with occasional popping but no significant pain.  CT head on 08/09/2021 personally reviewed was unremarkable.  Cold and heat may help.  Walking helps.  No known triggers.  Headache has not changed since onset.    Has past history of migraines when he was in his 39s.  He is concerned as he has history of lung cancer and thyroid cancer.  Past NSAIDS/analgesics:  tramadol Past abortive triptans:  none Past abortive ergotamine:  none Past muscle relaxants:  none Past anti-emetic:  Zofran Past antihypertensive medications:  none Past antidepressant medications:  none Past anticonvulsant medications:  none Past anti-CGRP:  none Past vitamins/Herbal/Supplements:  none Past antihistamines/decongestants:  Zyrtec Other past therapies:  none   PAST MEDICAL HISTORY: Past Medical History:  Diagnosis Date   Anxiety    with MRIs (or closed spaces)   Aortic insufficiency    Arthritis    CAD (coronary artery disease)  Colon polyps    Family history of adverse reaction to anesthesia    Mom vomits and Dad went "nuts"   Family history of colon cancer    Family history of stomach cancer    Fatigue    GERD (gastroesophageal reflux disease)    Hemorrhoids    History of  echocardiogram    Echo 02/08/16: Mild LVH, EF 55-60, no RWMA, Gr 1 DD, trivial AI, mild LAE   Hyperlipidemia    Hypertension    Lung cancer ([facility])    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   PONV (postoperative nausea and vomiting)    Pre-diabetes    Pulmonary nodules    Thyroid cancer ([facility])    thyroid removed    MEDICATIONS: Current Outpatient Medications on File Prior to Visit  Medication Sig Dispense Refill   acetaminophen (TYLENOL) 325 MG tablet Take 650 mg by mouth every 6 (six) hours as needed for moderate pain or headache.      amLODipine (NORVASC) 10 MG tablet Take 10 mg by mouth daily.     aspirin EC 81 MG tablet Take 81 mg by mouth daily.     carvedilol (COREG) 6.25 MG tablet Take 1 tablet (6.25 mg total) by mouth 2 (two) times daily with a meal. 180 tablet 3   Coenzyme Q10 (COQ10) 100 MG CAPS Take 100 mg by mouth daily.     dexamethasone (DECADRON) 4 MG tablet Take 2 tablets in the AM on 1/8, 1/9, and 1/10. Take 1 tablet in the AM on 1/11 - 1/17. Take half a tablet in the AM on 1/18 - 1/21. Take with food. 15 tablet 0   ezetimibe (ZETIA) 10 MG tablet TAKE 1 TABLET(10 MG) BY MOUTH DAILY 90 tablet 3   famotidine (PEPCID) 20 MG tablet Take 20 mg by mouth daily.     fluticasone (FLONASE) 50 MCG/ACT nasal spray Place 1 spray into both nostrils daily as needed for allergies or rhinitis (Spring and Fall).     ibuprofen (ADVIL,MOTRIN) 200 MG tablet Take 200 mg by mouth every 8 (eight) hours as needed for headache or moderate pain.     levothyroxine (SYNTHROID) 150 MCG tablet Take 150 mcg by mouth daily before breakfast.     LORazepam (ATIVAN) 0.5 MG tablet Take 1 tablet 30 minutes prior to radiation procedures or MRIs. 8 tablet 0   losartan (COZAAR) 100 MG tablet Take 100 mg by mouth daily.     nitroGLYCERIN (NITROSTAT) 0.4 MG SL tablet PLACE 1 TABLET UNDER THE TONGUE EVERY 5 MINUTES AS NEEDED FOR CHEST PAIN 25 tablet 11   Omega-3 Fatty Acids (FISH OIL) 1000 MG CAPS Take 2 capsules by mouth in the morning  and at bedtime.     ondansetron (ZOFRAN) 8 MG tablet Take 1 tablet (8 mg total) by mouth every 8 (eight) hours as needed for nausea or vomiting. 30 tablet 1   oxyCODONE (OXY IR/ROXICODONE) 5 MG immediate release tablet Take 5 mg by mouth every 6 (six) hours as needed for severe pain.     pantoprazole (PROTONIX) 40 MG tablet Take 40 mg by mouth daily.     pravastatin (PRAVACHOL) 80 MG tablet TAKE 1 TABLET(80 MG) BY MOUTH DAILY 90 tablet 1   promethazine (PHENERGAN) 25 MG tablet Take 1 tablet (25 mg total) by mouth every 6 (six) hours as needed for nausea or vomiting. 30 tablet 1   zolpidem (AMBIEN) 10 MG tablet Take 10 mg by mouth at bedtime.  0   No current  facility-administered medications on file prior to visit.    ALLERGIES: Allergies  Allergen Reactions   Gemfibrozil Other (See Comments)    Muscle pain   Propranolol     Other reaction(s): SOB  Only had issue with Slow release propranolol   Niaspan [Niacin Er] Rash and Other (See Comments)    Flushing - aspirin did not mitigate    Simvastatin Other (See Comments)    Drug-Drug interaction with amlodipine    FAMILY HISTORY: Family History  Problem Relation Age of Onset   Heart attack Father 52       BYPASS   Crohn's disease Sister    Parkinsonism Sister    Healthy Sister    Stomach cancer Paternal Uncle        diagnosed mid-30s   Throat cancer Paternal Uncle        hx of smoking   Cancer Paternal Uncle        unknown type   Cancer Paternal Uncle        unknown type   Thyroid cancer Daughter        papillary   Colon cancer Cousin 26       paternal cousin      Objective:  Blood pressure 134/69, pulse 64, height 5\' 11"  (1.803 m), weight 157 lb (71.2 kg), SpO2 99 %. General: No acute distress.  Patient appears well-groomed.   Head:  Normocephalic/atraumatic Eyes:  Fundi examined but not visualized Neck: supple, no paraspinal tenderness, full range of motion Heart:  Regular rate and rhythm Neurological Exam: alert  and oriented to person, place, and time.  Speech fluent and not dysarthric, language intact.  CN II-XII intact. Bulk and tone normal, muscle strength 5/5 throughout.  Sensation to light touch intact.  Deep tendon reflexes 2+ throughout.  Finger to nose testing intact.  Gait normal, Romberg negative.   Metta Clines, DO  CC: [name], MD

## 2022-01-25 NOTE — Radiation Completion Notes (Signed)
Patient Name: Miguel Wolfe, Miguel Wolfe MRN: [id number] Date of Birth: 06/11/1945 Referring Physician: [name], M.D. Date of Service: 2022-01-25 Radiation Oncologist: Lonie Peak, M.D. 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 Staging on 2018-06-23: Lung cancer, 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HCC) T=pT1c, N=pN0, M=cM0 Intent: Palliative     ==========DELIVERED PLANS==========  First Treatment Date: 2022-01-15 - Last Treatment Date: 2022-01-24   Plan Name: Spine_L1_SRT Site: Lumbar Spine Technique: SBRT/SRT-IMRT Mode: Photon Dose Per Fraction: 6 Gy Prescribed Dose (Delivered / Prescribed): 30 Gy / 30 Gy Prescribed Fxs (Delivered / Prescribed): 5 / 5     ==========ON TREATMENT VISIT DATES========== 2022-01-15, 2022-01-17, 2022-01-19, 2022-01-22, 2022-01-22, 2022-01-24     ==========UPCOMING VISITS==========       ==========APPENDIX - ON TREATMENT VISIT NOTES==========   PatEd 2022-01-22 Ongoing education performed.   ImpPlan 2022-01-22 The patient is tolerating radiation. Continue treatment as planned.   PhysExam 2022-01-22 Alert, no acute distress.   ProgNote 2022-01-22 Changes from last week/visit? [ No ] Headaches? Cognitive changes? [ No ] Fatigue? Nausea? [ No ] Skin irritation? (ex: forehead/scalp/ears) [ No ] Vision or auditory changes? [ No ] Diff. doing fine motor skills? [ No ] Aphasia or slurred speech? [ No ] Decadron dose? [ 1 pill ] Need refills: [ No ] Additional  Weekly Progress Notes [ has questions for Dr. Basilio Cairo, how will I know how radiation has worked.  When finished steroids when will immune system be back to normal. when can i have hernia surgery, if lung radiation needed could i have it.  ]

## 2022-01-28 ENCOUNTER — Other Ambulatory Visit: Payer: Self-pay | Admitting: Physician Assistant

## 2022-01-29 ENCOUNTER — Ambulatory Visit (INDEPENDENT_AMBULATORY_CARE_PROVIDER_SITE_OTHER): Payer: Medicare Other | Admitting: Neurology

## 2022-01-29 ENCOUNTER — Encounter: Payer: Self-pay | Admitting: Neurology

## 2022-01-29 VITALS — BP 134/69 | HR 64 | Ht 71.0 in | Wt 157.0 lb

## 2022-01-29 DIAGNOSIS — R9089 Other abnormal findings on diagnostic imaging of central nervous system: Secondary | ICD-10-CM | POA: Diagnosis not present

## 2022-01-29 DIAGNOSIS — R519 Headache, unspecified: Secondary | ICD-10-CM

## 2022-01-29 MED ORDER — LORAZEPAM 0.5 MG PO TABS: ORAL_TABLET | ORAL | 0 refills | Status: DC

## 2022-01-30 ENCOUNTER — Ambulatory Visit: Payer: Self-pay | Admitting: Radiation Oncology

## 2022-01-30 ENCOUNTER — Ambulatory Visit: Payer: Self-pay | Admitting: General Surgery

## 2022-02-01 NOTE — Patient Instructions (Addendum)
SURGICAL WAITING ROOM VISITATION  Patients having surgery or a procedure may have no more than 2 support people in the waiting area - these visitors may rotate.    Children under the age of 35 must have an adult with them who is not the patient.  Due to an increase in RSV and influenza rates and associated hospitalizations, children ages 6 and under may not visit patients in [place].  If the patient needs to stay at the hospital during part of their recovery, the visitor guidelines for inpatient rooms apply. Pre-op nurse will coordinate an appropriate time for 1 support person to accompany patient in pre-op.  This support person may not rotate.    Please refer to the [facility] website for the visitor guidelines for Inpatients (after your surgery is over and you are in a regular room).    Your procedure is scheduled on: 02/07/22   Report to [facility] Main Entrance    Report to admitting at 5:15 AM   Call this number if you have problems the morning of surgery [phone]   Do not eat food :After Midnight.   After Midnight you may have the following liquids until 4:30 AM DAY OF SURGERY  Water Non-Citrus Juices (without pulp, NO RED-Apple, White grape, White cranberry) Black Coffee (NO MILK/CREAM OR CREAMERS, sugar ok)  Clear Tea (NO MILK/CREAM OR CREAMERS, sugar ok) regular and decaf                             Plain Jell-O (NO RED)                                           Fruit ices (not with fruit pulp, NO RED)                                     Popsicles (NO RED)                                                               Sports drinks like Gatorade (NO RED)                      If you have questions, please contact your surgeon's office.   FOLLOW BOWEL PREP AND ANY ADDITIONAL PRE OP INSTRUCTIONS YOU RECEIVED FROM YOUR SURGEON'S OFFICE!!!     Oral Hygiene is also important to reduce your risk of infection.                                     Remember - BRUSH YOUR TEETH THE MORNING OF SURGERY WITH YOUR REGULAR TOOTHPASTE  DENTURES WILL BE REMOVED PRIOR TO SURGERY PLEASE DO NOT APPLY "Poly grip" OR ADHESIVES!!!   Take these medicines the morning of surgery with A SIP OF WATER: Tylenol, Amlodipine, Carvedilol, Zetia, Pepcid, Levothyroxine, Ativan, Zofran, Pantoprazole, Pravastatin  You may not have any metal on your body including  jewelry, and body piercing             Do not wear lotions, powders, cologne, or deodorant  Do not shave  48 hours prior to surgery.               Men may shave face and neck.   Do not bring valuables to the hospital. [place].   Contacts, glasses, dentures or bridgework may not be worn into surgery.  DO NOT [place]. PHARMACY WILL DISPENSE MEDICATIONS LISTED ON YOUR MEDICATION LIST TO YOU DURING YOUR ADMISSION [place]!    Patients discharged on the day of surgery will not be allowed to drive home.  Someone NEEDS to stay with you for the first 24 hours after anesthesia.   Special Instructions: Bring a copy of your healthcare power of attorney and living will documents the day of surgery if you haven't scanned them before.              Please read over the following fact sheets you were given: IF [place] [phone] Schneiders    If you received a COVID test during your pre-op visit  it is requested that you wear a mask when out in public, stay away from anyone that may not be feeling well and notify your surgeon if you develop symptoms. If you test positive for Covid or have been in contact with anyone that has tested positive in the last 10 days please notify you surgeon.    [place] - Preparing for Surgery Before surgery, you can play an important role.  Because skin is not sterile, your skin needs to be as free of  germs as possible.  You can reduce the number of germs on your skin by washing with CHG (chlorahexidine gluconate) soap before surgery.  CHG is an antiseptic cleaner which kills germs and bonds with the skin to continue killing germs even after washing. Please DO NOT use if you have an allergy to CHG or antibacterial soaps.  If your skin becomes reddened/irritated stop using the CHG and inform your nurse when you arrive at Short Stay. Do not shave (including legs and underarms) for at least 48 hours prior to the first CHG shower.  You may shave your face/neck.  Please follow these instructions carefully:  1.  Shower with CHG Soap the night before surgery and the  morning of surgery.  2.  If you choose to wash your hair, wash your hair first as usual with your normal  shampoo.  3.  After you shampoo, rinse your hair and body thoroughly to remove the shampoo.                             4.  Use CHG as you would any other liquid soap.  You can apply chg directly to the skin and wash.  Gently with a scrungie or clean washcloth.  5.  Apply the CHG Soap to your body ONLY FROM THE NECK DOWN.   Do   not use on face/ open  Wound or open sores. Avoid contact with eyes, ears mouth and   genitals (private parts).                       Wash face,  Genitals (private parts) with your normal soap.             6.  Wash thoroughly, paying special attention to the area where your    surgery  will be performed.  7.  Thoroughly rinse your body with warm water from the neck down.  8.  DO NOT shower/wash with your normal soap after using and rinsing off the CHG Soap.                9.  Pat yourself dry with a clean towel.            10.  Wear clean pajamas.            11.  Place clean sheets on your bed the night of your first shower and do not  sleep with pets. Day of Surgery : Do not apply any lotions/deodorants the morning of surgery.  Please wear clean clothes to the hospital/surgery  center.  FAILURE TO FOLLOW THESE INSTRUCTIONS MAY RESULT IN THE CANCELLATION OF YOUR SURGERY  PATIENT SIGNATURE_________________________________  NURSE SIGNATURE__________________________________  ________________________________________________________________________

## 2022-02-01 NOTE — Progress Notes (Addendum)
COVID Vaccine Completed: yes  Date of COVID positive in last 90 days: no  PCP - Quillian Quince, MD Cardiologist - Candee Furbish, MD Oncologist- Curt Bears, MD  Cardiac clearance 01/19/22 by Christen Bame in Epic  Chest x-ray - CT 03/01/21 Epic EKG - 01/25/22 Epic Stress Test - 02/16/21 Epic ECHO - 05/16/21 Epic Cardiac Cath - 02/20/16 Epic Pacemaker/ICD device last checked: n/a Spinal Cord Stimulator: n/a  Bowel Prep - no  Sleep Study - n/a CPAP -   Fasting Blood Sugar - preDM Checks Blood Sugar no checks at home  Last dose of GLP1 agonist-  N/A GLP1 instructions:  N/A   Last dose of SGLT-2 inhibitors-  N/A SGLT-2 instructions: N/A   Blood Thinner Instructions: Aspirin Instructions: ASA 81 hold 7 days Last Dose: 01/30/22  Activity level: Can go up a flight of stairs and perform activities of daily living without stopping and without symptoms of chest pain or shortness of breath.   Anesthesia review: HTN, CAD, lung nodule, stent x1  Patient denies shortness of breath, fever, cough and chest pain at PAT appointment  Patient verbalized understanding of instructions that were given to them at the PAT appointment. Patient was also instructed that they will need to review over the PAT instructions again at home before surgery.

## 2022-02-05 ENCOUNTER — Encounter (HOSPITAL_COMMUNITY): Payer: Self-pay

## 2022-02-05 ENCOUNTER — Encounter (HOSPITAL_COMMUNITY)
Admission: RE | Admit: 2022-02-05 | Discharge: 2022-02-05 | Disposition: A | Discharge status: Home / Self Care | Payer: Medicare Other | Source: Ambulatory Visit | Attending: General Surgery | Admitting: General Surgery

## 2022-02-05 VITALS — BP 123/82 | HR 72 | Temp 98.6°F | Resp 14 | Ht 71.0 in | Wt 153.0 lb

## 2022-02-05 DIAGNOSIS — I10 Essential (primary) hypertension: Secondary | ICD-10-CM | POA: Diagnosis not present

## 2022-02-05 DIAGNOSIS — E890 Postprocedural hypothyroidism: Secondary | ICD-10-CM | POA: Insufficient documentation

## 2022-02-05 DIAGNOSIS — K4090 Unilateral inguinal hernia, without obstruction or gangrene, not specified as recurrent: Secondary | ICD-10-CM | POA: Insufficient documentation

## 2022-02-05 DIAGNOSIS — Z85850 Personal history of malignant neoplasm of thyroid: Secondary | ICD-10-CM | POA: Insufficient documentation

## 2022-02-05 DIAGNOSIS — Z87891 Personal history of nicotine dependence: Secondary | ICD-10-CM | POA: Insufficient documentation

## 2022-02-05 DIAGNOSIS — Z01812 Encounter for preprocedural laboratory examination: Secondary | ICD-10-CM | POA: Insufficient documentation

## 2022-02-05 DIAGNOSIS — Z902 Acquired absence of lung [part of]: Secondary | ICD-10-CM | POA: Insufficient documentation

## 2022-02-05 DIAGNOSIS — Z955 Presence of coronary angioplasty implant and graft: Secondary | ICD-10-CM | POA: Insufficient documentation

## 2022-02-05 DIAGNOSIS — R7303 Prediabetes: Secondary | ICD-10-CM | POA: Insufficient documentation

## 2022-02-05 DIAGNOSIS — Z85118 Personal history of other malignant neoplasm of bronchus and lung: Secondary | ICD-10-CM | POA: Insufficient documentation

## 2022-02-05 DIAGNOSIS — I351 Nonrheumatic aortic (valve) insufficiency: Secondary | ICD-10-CM | POA: Diagnosis not present

## 2022-02-05 DIAGNOSIS — I2510 Atherosclerotic heart disease of native coronary artery without angina pectoris: Secondary | ICD-10-CM | POA: Insufficient documentation

## 2022-02-05 LAB — BASIC METABOLIC PANEL
Anion gap: 10 (ref 5–15)
BUN: 14 mg/dL (ref 8–23)
CO2: 29 mmol/L (ref 22–32)
Calcium: 8.5 mg/dL — ABNORMAL LOW (ref 8.9–10.3)
Chloride: 101 mmol/L (ref 98–111)
Creatinine, Ser: 0.7 mg/dL (ref 0.61–1.24)
GFR, Estimated: >60 mL/min (ref >60)
Glucose, Bld: 129 mg/dL — ABNORMAL HIGH (ref 70–99)
Potassium: 3.8 mmol/L (ref 3.5–5.1)
Sodium: 140 mmol/L (ref 135–145)

## 2022-02-05 LAB — CBC
HCT: 44.6 % (ref 39.0–52.0)
Hemoglobin: 14.7 g/dL (ref 13.0–17.0)
MCH: 30.2 pg (ref 26.0–34.0)
MCHC: 33 g/dL (ref 30.0–36.0)
MCV: 91.6 fL (ref 80.0–100.0)
Platelets: 130 10*3/uL — ABNORMAL LOW (ref 150–400)
RBC: 4.87 MIL/uL (ref 4.22–5.81)
RDW: 13.3 % (ref 11.5–15.5)
WBC: 5.7 10*3/uL (ref 4.0–10.5)
nRBC: 0 % (ref 0.0–0.2)

## 2022-02-05 LAB — HEMOGLOBIN A1C
Hgb A1c MFr Bld: 5.9 % — ABNORMAL HIGH (ref 4.8–5.6)
Mean Plasma Glucose: 122.63 mg/dL

## 2022-02-06 NOTE — Anesthesia Preprocedure Evaluation (Addendum)
Anesthesia Evaluation  Patient identified by MRN, date of birth, ID band Patient awake    Reviewed: Allergy & Precautions, NPO status , Patient's Chart, lab work & pertinent test results  History of Anesthesia Complications (+) PONV, Family history of anesthesia reaction and history of anesthetic complications  Airway Mallampati: IV  TM Distance: <3 FB Neck ROM: Full    Dental  (+) Dental Advisory Given, Teeth Intact,    Pulmonary former smoker   breath sounds clear to auscultation       Cardiovascular hypertension, Pt. on medications + CAD and + Cardiac Stents   Rhythm:Regular  1. Left ventricular ejection fraction, by estimation, is 60 to 65%. The  left ventricle has normal function. The left ventricle has no regional  wall motion abnormalities. The left ventricular internal cavity size was  mildly dilated. Left ventricular  diastolic parameters are consistent with Grade I diastolic dysfunction  (impaired relaxation).   2. Right ventricular systolic function is normal. The right ventricular  size is normal.   3. The mitral valve is normal in structure. No evidence of mitral valve  regurgitation. No evidence of mitral stenosis.   4. The aortic valve is tricuspid. Aortic valve regurgitation is mild to  moderate. Aortic valve sclerosis is present, with no evidence of aortic  valve stenosis.   5. Aortic dilatation noted. There is mild dilatation of the aortic root,  measuring 41 mm. There is mild dilatation of the ascending aorta,  measuring 41 mm.   6. The inferior vena cava is normal in size with greater than 50%  respiratory variability, suggesting right atrial pressure of 3 mmHg.     The study is normal. The study is low risk.   No ST deviation was noted.   Left ventricular function is normal. Nuclear stress EF: 59 %. The left ventricular ejection fraction is normal (55-65%). End diastolic cavity size is normal.    Prior study available for comparison from 10/02/2017. 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    GI/Hepatic Neg liver ROS,GERD  ,,  Endo/Other  negative endocrine ROS    Renal/GU negative Renal ROS     Musculoskeletal  (+) Arthritis ,    Abdominal   Peds  Hematology negative hematology ROS (+)   Anesthesia Other Findings   Reproductive/Obstetrics                             Anesthesia Physical Anesthesia Plan  ASA: 3  Anesthesia Plan: General   Post-op Pain Management: Ofirmev IV (intra-op)* and Toradol IV (intra-op)*   Induction: Intravenous  PONV Risk Score and Plan: 3 and Ondansetron and Dexamethasone  Airway Management Planned: Oral ETT and Video Laryngoscope Planned  Additional Equipment: None  Intra-op Plan:   Post-operative Plan: Extubation in OR  Informed Consent: I have reviewed the patients History and Physical, chart, labs and discussed the procedure including the risks, benefits and alternatives for the proposed anesthesia with the patient or authorized representative who has indicated his/her understanding and acceptance.     Dental advisory given  Plan Discussed with: CRNA  Anesthesia Plan Comments: (PAT note by Karoline Caldwell, PA-C: Follows with cardiology for hx of CAD s/p DES to RCA 2018.  Nuclear stress 02/2021 was low risk.  Echo 05/2021 showed EF 60 to  65%, grade 1 DD, no significant valvular abnormalities.  Last seen by Dr. Marlou Porch 08/07/2021.  Per note, doing well at that time, no anginal symptoms.  No changes made to management.  History of thyroid cancer s/p thyroidectomy and lung cancer s/p left upper lobectomy.  Preop labs reviewed, WNL.    Video Laryngoscopy used due to patient endorse h/o weak left vocal cord  EKG 01/25/2021: Sinus rhythm.  Rate 63.  LAD.  CT Chest  03/01/21: IMPRESSION: 1. Status post left upper lobectomy, without typical findings of recurrent or metastatic disease. 2. Similar right apical mixed attenuation focus, favored to represent scarring. Given low-level activity on prior PET, low-grade adenocarcinoma could look similar. Recommend attention on follow-up. 3. Similar appearance of the L1 vertebral body lesion, favoring hemangioma over osseous metastasis. 4. Similar right paravertebral/retrocrural lesion, again favoring neurogenic tumor. 5. Coronary artery atherosclerosis. Aortic Atherosclerosis (ICD10-I70.0). 6. Apparent proximal gastric wall thickening, possibly due to underdistention. Correlate with any symptoms of gastritis.  TTE 05/16/2021: 1. Left ventricular ejection fraction, by estimation, is 60 to 65%. The  left ventricle has normal function. The left ventricle has no regional  wall motion abnormalities. The left ventricular internal cavity size was  mildly dilated. Left ventricular  diastolic parameters are consistent with Grade I diastolic dysfunction  (impaired relaxation).  2. Right ventricular systolic function is normal. The right ventricular  size is normal.  3. The mitral valve is normal in structure. No evidence of mitral valve  regurgitation. No evidence of mitral stenosis.  4. The aortic valve is tricuspid. Aortic valve regurgitation is mild to  moderate. Aortic valve sclerosis is present, with no evidence of aortic  valve stenosis.  5. Aortic dilatation noted. There is mild dilatation of the aortic root,  measuring 41 mm. There is mild dilatation of the ascending aorta,  measuring 41 mm.  6. The inferior vena cava is normal in size with greater than 50%  respiratory variability, suggesting right atrial pressure of 3 mmHg.   Nuclear stress 02/16/2021:  The study is normal. The study is low risk.  No ST deviation was noted.  Left ventricular function is normal. Nuclear stress EF: 59 %. The  left ventricular ejection fraction is normal (55-65%). End diastolic cavity size is normal.  Prior study available for comparison from 10/02/2017. No signifcant ischemic defects compared to prior study  )        Anesthesia Quick Evaluation

## 2022-02-06 NOTE — Progress Notes (Signed)
Anesthesia Chart Review   Case: [id number] Date/Time: 02/07/22 0715   Procedure: OPEN RIGHT INGUINAL HERNIA REPAIR WITH MESH (Right)   Anesthesia type: General   Pre-op diagnosis: RIGHT INGUINALHERNIA   Location: [facility] ROOM 04 / [facility]   Surgeons: Kinsinger, Arta Bruce, MD       DISCUSSION:77 y.o. former smoker with h/o PONV, HTN, CAD (DES to RCA 02/2016), lung cancer s/p left upper lobectomy 2020, thyroid cancer s/p thyroidectomy, mild to moderate AI, mild dilatation of aortic root, right inguinal hernia repair scheduled for above procedure 02/07/2022 with Dr. Gurney Maxin.   Follows with cardiology for hx of CAD s/p DES to RCA 2018.  Nuclear stress 02/2021 was low risk.  Echo 05/2021 showed EF 60 to 65%, grade 1 DD, no significant valvular abnormalities.   Pt last seen by cardiology 01/19/2022. Per OV note, "Preoperative Cardiovascular Risk Assessment:The patient is doing well from a cardiac perspective. Therefore, based on ACC/AHA guidelines, the patient would be at acceptable risk for the planned procedure without further cardiovascular testing. According to the Revised Cardiac Risk Index (RCRI), his Perioperative Risk of Major Cardiac Event is (%): 0.9 His Functional Capacity in METs is: 6.05 according to the Duke Activity Status Index (DASI)."  Anticipate pt can proceed with planned procedure barring acute status change.   VS: BP 123/82   Pulse 72   Temp 37 C (Oral)   Resp 14   Ht 5\' 11"  (1.803 m)   Wt 69.4 kg   SpO2 99%   BMI 21.34 kg/m   PROVIDERS: [name], MD is PCP   Cardiologist - Candee Furbish, MD  LABS: Labs reviewed: Acceptable for surgery. (all labs ordered are listed, but only abnormal results are displayed)  Labs Reviewed  HEMOGLOBIN A1C - Abnormal; Notable for the following components:      Result Value   Hgb A1c MFr Bld 5.9 (*)    All other components within normal limits  BASIC METABOLIC PANEL - Abnormal; Notable for the following components:   Glucose,  Bld 129 (*)    Calcium 8.5 (*)    All other components within normal limits  CBC - Abnormal; Notable for the following components:   Platelets 130 (*)    All other components within normal limits     IMAGES:   EKG:   CV: Echo 05/16/2021 1. Left ventricular ejection fraction, by estimation, is 60 to 65%. The  left ventricle has normal function. The left ventricle has no regional  wall motion abnormalities. The left ventricular internal cavity size was  mildly dilated. Left ventricular  diastolic parameters are consistent with Grade I diastolic dysfunction  (impaired relaxation).   2. Right ventricular systolic function is normal. The right ventricular  size is normal.   3. The mitral valve is normal in structure. No evidence of mitral valve  regurgitation. No evidence of mitral stenosis.   4. The aortic valve is tricuspid. Aortic valve regurgitation is mild to  moderate. Aortic valve sclerosis is present, with no evidence of aortic  valve stenosis.   5. Aortic dilatation noted. There is mild dilatation of the aortic root,  measuring 41 mm. There is mild dilatation of the ascending aorta,  measuring 41 mm.   6. The inferior vena cava is normal in size with greater than 50%  respiratory variability, suggesting right atrial pressure of 3 mmHg.   Myocardial Perfusion 02/16/2021   The study is normal. The study is low risk.   No ST deviation was  noted.   Left ventricular function is normal. Nuclear stress EF: 59 %. The left ventricular ejection fraction is normal (55-65%). End diastolic cavity size is normal.   Prior study available for comparison from 10/02/2017. No signifcant ischemic defects compared to prior study Past Medical History:  Diagnosis Date   Anxiety    with MRIs (or closed spaces)   Aortic insufficiency    Arthritis    CAD (coronary artery disease)    Colon polyps    Family history of adverse reaction to anesthesia    Mom vomits and Dad went "nuts"   Family  history of colon cancer    Family history of stomach cancer    Fatigue    GERD (gastroesophageal reflux disease)    Hemorrhoids    History of echocardiogram    Echo 02/08/16: Mild LVH, EF 55-60, no RWMA, Gr 1 DD, trivial AI, mild LAE   Hyperlipidemia    Hypertension    Lung cancer ([place])    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   PONV (postoperative nausea and vomiting)    Pre-diabetes    Pulmonary nodules    Thyroid cancer ([place])    thyroid removed    Past Surgical History:  Procedure Laterality Date   BACK SURGERY     CAROTID ENDARTERECTOMY     COLONOSCOPY WITH ESOPHAGOGASTRODUODENOSCOPY (EGD)  2008   CORONARY STENT INTERVENTION N/A 02/20/2016   Procedure: Coronary Stent Intervention;  Surgeon: Burnell Blanks, MD;  Location: [facility];  Service: Cardiovascular;  Laterality: N/A;   ESOPHAGOGASTRODUODENOSCOPY     LEFT HEART CATH AND CORONARY ANGIOGRAPHY N/A 02/20/2016   Procedure: Left Heart Cath and Coronary Angiography;  Surgeon: Burnell Blanks, MD;  Location: [facility];  Service: Cardiovascular;  Laterality: N/A;   LUNG REMOVAL, PARTIAL Left 06/24/2018   Pt states "1/2 of lung was removed"   PILONIDAL CYST EXCISION     REFRACTIVE SURGERY Right    retina tear repair   THYROIDECTOMY N/A 01/17/2018   Procedure: TOTAL THYROIDECTOMY;  Surgeon: Armandina Gemma, MD;  Location: [facility];  Service: General;  Laterality: N/A;   VIDEO ASSISTED THORACOSCOPY (VATS)/WEDGE RESECTION Left 06/24/2018   Procedure: VIDEO ASSISTED THORACOSCOPY (VATS)/LUNG RESECTION;  Surgeon: Grace Isaac, MD;  Location: [facility];  Service: Thoracic;  Laterality: Left;   VIDEO BRONCHOSCOPY WITH ENDOBRONCHIAL NAVIGATION N/A 11/18/2017   Procedure: VIDEO BRONCHOSCOPY WITH ENDOBRONCHIAL NAVIGATION WITH BIOPSIES OF LEFT AND RIGHT UPPER LOBES;  Surgeon: Grace Isaac, MD;  Location: [facility];  Service: Thoracic;  Laterality: N/A;   VIDEO BRONCHOSCOPY WITH ENDOBRONCHIAL NAVIGATION N/A 06/11/2018    Procedure: VIDEO BRONCHOSCOPY WITH ENDOBRONCHIAL NAVIGATION;  Surgeon: Grace Isaac, MD;  Location: [facility];  Service: Thoracic;  Laterality: N/A;    MEDICATIONS:  acetaminophen (TYLENOL) 325 MG tablet   amLODipine (NORVASC) 10 MG tablet   aspirin EC 81 MG tablet   carvedilol (COREG) 6.25 MG tablet   Coenzyme Q10 (COQ10) 100 MG CAPS   ezetimibe (ZETIA) 10 MG tablet   famotidine (PEPCID) 20 MG tablet   fluticasone (FLONASE) 50 MCG/ACT nasal spray   ibuprofen (ADVIL,MOTRIN) 200 MG tablet   levothyroxine (SYNTHROID) 150 MCG tablet   LORazepam (ATIVAN) 0.5 MG tablet   losartan (COZAAR) 100 MG tablet   nitroGLYCERIN (NITROSTAT) 0.4 MG SL tablet   Omega-3 Fatty Acids (FISH OIL) 1000 MG CAPS   ondansetron (ZOFRAN) 8 MG tablet   pantoprazole (PROTONIX) 40 MG tablet   pravastatin (PRAVACHOL) 80 MG tablet  zolpidem (AMBIEN) 10 MG tablet   No current facility-administered medications for this encounter.    Konrad Felix Ward, PA-C [facility] [phone]

## 2022-02-07 ENCOUNTER — Ambulatory Visit (HOSPITAL_COMMUNITY): Payer: Medicare Other | Admitting: Physician Assistant

## 2022-02-07 ENCOUNTER — Ambulatory Visit (HOSPITAL_COMMUNITY)
Admission: RE | Admit: 2022-02-07 | Discharge: 2022-02-07 | Disposition: A | Discharge status: Home / Self Care | Payer: Medicare Other | Attending: General Surgery | Admitting: General Surgery

## 2022-02-07 ENCOUNTER — Other Ambulatory Visit: Payer: Self-pay

## 2022-02-07 ENCOUNTER — Encounter (HOSPITAL_COMMUNITY): Payer: Self-pay | Admitting: General Surgery

## 2022-02-07 ENCOUNTER — Ambulatory Visit (HOSPITAL_BASED_OUTPATIENT_CLINIC_OR_DEPARTMENT_OTHER): Payer: Medicare Other | Admitting: Anesthesiology

## 2022-02-07 ENCOUNTER — Encounter (HOSPITAL_COMMUNITY)
Admission: RE | Disposition: A | Discharge status: Home / Self Care | Payer: Self-pay | Source: Home / Self Care | Attending: General Surgery

## 2022-02-07 DIAGNOSIS — Z8249 Family history of ischemic heart disease and other diseases of the circulatory system: Secondary | ICD-10-CM | POA: Insufficient documentation

## 2022-02-07 DIAGNOSIS — Z87891 Personal history of nicotine dependence: Secondary | ICD-10-CM | POA: Diagnosis not present

## 2022-02-07 DIAGNOSIS — M1990 Unspecified osteoarthritis, unspecified site: Secondary | ICD-10-CM | POA: Insufficient documentation

## 2022-02-07 DIAGNOSIS — R7303 Prediabetes: Secondary | ICD-10-CM

## 2022-02-07 DIAGNOSIS — Z79899 Other long term (current) drug therapy: Secondary | ICD-10-CM | POA: Insufficient documentation

## 2022-02-07 DIAGNOSIS — I2510 Atherosclerotic heart disease of native coronary artery without angina pectoris: Secondary | ICD-10-CM | POA: Insufficient documentation

## 2022-02-07 DIAGNOSIS — I10 Essential (primary) hypertension: Secondary | ICD-10-CM | POA: Insufficient documentation

## 2022-02-07 DIAGNOSIS — F419 Anxiety disorder, unspecified: Secondary | ICD-10-CM | POA: Insufficient documentation

## 2022-02-07 DIAGNOSIS — K219 Gastro-esophageal reflux disease without esophagitis: Secondary | ICD-10-CM | POA: Insufficient documentation

## 2022-02-07 DIAGNOSIS — G8918 Other acute postprocedural pain: Secondary | ICD-10-CM | POA: Diagnosis not present

## 2022-02-07 DIAGNOSIS — K4090 Unilateral inguinal hernia, without obstruction or gangrene, not specified as recurrent: Secondary | ICD-10-CM | POA: Diagnosis not present

## 2022-02-07 DIAGNOSIS — I351 Nonrheumatic aortic (valve) insufficiency: Secondary | ICD-10-CM | POA: Insufficient documentation

## 2022-02-07 DIAGNOSIS — Z955 Presence of coronary angioplasty implant and graft: Secondary | ICD-10-CM | POA: Insufficient documentation

## 2022-02-07 HISTORY — PX: INGUINAL HERNIA REPAIR: SHX194

## 2022-02-07 SURGERY — REPAIR, HERNIA, INGUINAL, ADULT
Anesthesia: General | Laterality: Right

## 2022-02-07 MED ORDER — ACETAMINOPHEN 325 MG PO TABS: 325.0000 mg | ORAL_TABLET | ORAL | Status: DC | PRN

## 2022-02-07 MED ORDER — LIDOCAINE HCL (CARDIAC) PF 100 MG/5ML IV SOSY
PREFILLED_SYRINGE | INTRAVENOUS | Status: DC | PRN
  Administered 2022-02-07: 60 mg via INTRAVENOUS

## 2022-02-07 MED ORDER — 0.9 % SODIUM CHLORIDE (POUR BTL) OPTIME
TOPICAL | Status: DC | PRN
  Administered 2022-02-07: 1000 mL

## 2022-02-07 MED ORDER — CHLORHEXIDINE GLUCONATE CLOTH 2 % EX PADS: 6.0000 | MEDICATED_PAD | Freq: Once | CUTANEOUS | Status: DC

## 2022-02-07 MED ORDER — DEXAMETHASONE SODIUM PHOSPHATE 10 MG/ML IJ SOLN
INTRAMUSCULAR | Status: DC | PRN
  Administered 2022-02-07: 10 mg

## 2022-02-07 MED ORDER — ONDANSETRON HCL 4 MG/2ML IJ SOLN: 4.0000 mg | Freq: Once | INTRAMUSCULAR | Status: DC | PRN

## 2022-02-07 MED ORDER — MIDAZOLAM HCL 5 MG/5ML IJ SOLN
INTRAMUSCULAR | Status: DC | PRN
  Administered 2022-02-07: 2 mg via INTRAVENOUS

## 2022-02-07 MED ORDER — LACTATED RINGERS IV SOLN: INTRAVENOUS | Status: DC

## 2022-02-07 MED ORDER — CEFAZOLIN SODIUM-DEXTROSE 2-4 GM/100ML-% IV SOLN
2.0000 g | INTRAVENOUS | Status: AC
  Administered 2022-02-07: 2 g via INTRAVENOUS
  Filled 2022-02-07: qty 100

## 2022-02-07 MED ORDER — CHLORHEXIDINE GLUCONATE 0.12 % MT SOLN
15.0000 mL | Freq: Once | OROMUCOSAL | Status: AC
  Administered 2022-02-07: 15 mL via OROMUCOSAL

## 2022-02-07 MED ORDER — MEPERIDINE HCL 50 MG/ML IJ SOLN: 6.2500 mg | INTRAMUSCULAR | Status: DC | PRN

## 2022-02-07 MED ORDER — EPHEDRINE SULFATE-NACL 50-0.9 MG/10ML-% IV SOSY
PREFILLED_SYRINGE | INTRAVENOUS | Status: DC | PRN
  Administered 2022-02-07 (×2): 10 mg via INTRAVENOUS

## 2022-02-07 MED ORDER — PHENYLEPHRINE 80 MCG/ML (10ML) SYRINGE FOR IV PUSH (FOR BLOOD PRESSURE SUPPORT)
PREFILLED_SYRINGE | INTRAVENOUS | Status: DC | PRN
  Administered 2022-02-07 (×2): 160 ug via INTRAVENOUS

## 2022-02-07 MED ORDER — ORAL CARE MOUTH RINSE: 15.0000 mL | Freq: Once | OROMUCOSAL | Status: AC

## 2022-02-07 MED ORDER — DEXAMETHASONE SODIUM PHOSPHATE 10 MG/ML IJ SOLN
INTRAMUSCULAR | Status: DC | PRN
  Administered 2022-02-07: 10 mg via INTRAVENOUS

## 2022-02-07 MED ORDER — PHENYLEPHRINE 80 MCG/ML (10ML) SYRINGE FOR IV PUSH (FOR BLOOD PRESSURE SUPPORT)
PREFILLED_SYRINGE | INTRAVENOUS | Status: AC
  Filled 2022-02-07: qty 10

## 2022-02-07 MED ORDER — ONDANSETRON HCL 4 MG/2ML IJ SOLN
INTRAMUSCULAR | Status: DC | PRN
  Administered 2022-02-07: 4 mg via INTRAVENOUS

## 2022-02-07 MED ORDER — SUGAMMADEX SODIUM 200 MG/2ML IV SOLN
INTRAVENOUS | Status: DC | PRN
  Administered 2022-02-07: 200 mg via INTRAVENOUS

## 2022-02-07 MED ORDER — EPHEDRINE 5 MG/ML INJ
INTRAVENOUS | Status: AC
  Filled 2022-02-07: qty 5

## 2022-02-07 MED ORDER — ACETAMINOPHEN 160 MG/5ML PO SOLN: 325.0000 mg | ORAL | Status: DC | PRN

## 2022-02-07 MED ORDER — IBUPROFEN 800 MG PO TABS: 800.0000 mg | ORAL_TABLET | Freq: Three times a day (TID) | ORAL | 0 refills | Status: DC | PRN

## 2022-02-07 MED ORDER — BUPIVACAINE HCL (PF) 0.5 % IJ SOLN
INTRAMUSCULAR | Status: AC
  Filled 2022-02-07: qty 30

## 2022-02-07 MED ORDER — FENTANYL CITRATE PF 50 MCG/ML IJ SOSY: 25.0000 ug | PREFILLED_SYRINGE | INTRAMUSCULAR | Status: DC | PRN

## 2022-02-07 MED ORDER — DEXAMETHASONE SODIUM PHOSPHATE 10 MG/ML IJ SOLN
INTRAMUSCULAR | Status: AC
  Filled 2022-02-07: qty 1

## 2022-02-07 MED ORDER — MIDAZOLAM HCL 2 MG/2ML IJ SOLN
INTRAMUSCULAR | Status: AC
  Filled 2022-02-07: qty 2

## 2022-02-07 MED ORDER — ACETAMINOPHEN 500 MG PO TABS
1000.0000 mg | ORAL_TABLET | ORAL | Status: AC
  Administered 2022-02-07: 1000 mg via ORAL
  Filled 2022-02-07: qty 2

## 2022-02-07 MED ORDER — ROCURONIUM BROMIDE 10 MG/ML (PF) SYRINGE
PREFILLED_SYRINGE | INTRAVENOUS | Status: AC
  Filled 2022-02-07: qty 10

## 2022-02-07 MED ORDER — BUPIVACAINE HCL (PF) 0.25 % IJ SOLN
INTRAMUSCULAR | Status: DC | PRN
  Administered 2022-02-07: 30 mL via PERINEURAL

## 2022-02-07 MED ORDER — ROCURONIUM BROMIDE 100 MG/10ML IV SOLN
INTRAVENOUS | Status: DC | PRN
  Administered 2022-02-07: 60 mg via INTRAVENOUS

## 2022-02-07 MED ORDER — OXYCODONE HCL 5 MG PO TABS: 5.0000 mg | ORAL_TABLET | Freq: Four times a day (QID) | ORAL | 0 refills | Status: DC | PRN

## 2022-02-07 MED ORDER — FENTANYL CITRATE (PF) 100 MCG/2ML IJ SOLN
INTRAMUSCULAR | Status: DC | PRN
  Administered 2022-02-07: 100 ug via INTRAVENOUS

## 2022-02-07 MED ORDER — ONDANSETRON HCL 4 MG/2ML IJ SOLN
INTRAMUSCULAR | Status: AC
  Filled 2022-02-07: qty 2

## 2022-02-07 MED ORDER — BUPIVACAINE LIPOSOME 1.3 % IJ SUSP
INTRAMUSCULAR | Status: AC
  Filled 2022-02-07: qty 20

## 2022-02-07 MED ORDER — OXYCODONE HCL 5 MG PO TABS: 5.0000 mg | ORAL_TABLET | Freq: Once | ORAL | Status: DC | PRN

## 2022-02-07 MED ORDER — BUPIVACAINE HCL (PF) 0.5 % IJ SOLN
INTRAMUSCULAR | Status: DC | PRN
  Administered 2022-02-07: 10 mL

## 2022-02-07 MED ORDER — LIDOCAINE HCL (PF) 2 % IJ SOLN
INTRAMUSCULAR | Status: AC
  Filled 2022-02-07: qty 5

## 2022-02-07 MED ORDER — PROPOFOL 10 MG/ML IV BOLUS
INTRAVENOUS | Status: DC | PRN
  Administered 2022-02-07: 150 mg via INTRAVENOUS

## 2022-02-07 MED ORDER — OXYCODONE HCL 5 MG/5ML PO SOLN: 5.0000 mg | Freq: Once | ORAL | Status: DC | PRN

## 2022-02-07 MED ORDER — PROPOFOL 10 MG/ML IV BOLUS
INTRAVENOUS | Status: AC
  Filled 2022-02-07: qty 20

## 2022-02-07 MED ORDER — FENTANYL CITRATE (PF) 100 MCG/2ML IJ SOLN
INTRAMUSCULAR | Status: AC
  Filled 2022-02-07: qty 2

## 2022-02-07 SURGICAL SUPPLY — 44 items
ADH SKN CLS APL DERMABOND .7 (GAUZE/BANDAGES/DRESSINGS) ×1
APL PRP STRL LF DISP 70% ISPRP (MISCELLANEOUS) ×1
APL SKNCLS STERI-STRIP NONHPOA (GAUZE/BANDAGES/DRESSINGS)
BAG COUNTER SPONGE SURGICOUNT (BAG) ×2 IMPLANT
BAG SPNG CNTER NS LX DISP (BAG) ×1
BENZOIN TINCTURE PRP APPL 2/3 (GAUZE/BANDAGES/DRESSINGS) IMPLANT
BLADE SURG 15 STRL LF DISP TIS (BLADE) ×2 IMPLANT
BLADE SURG 15 STRL SS (BLADE) ×1
CHLORAPREP W/TINT 26 (MISCELLANEOUS) ×2 IMPLANT
COVER SURGICAL LIGHT HANDLE (MISCELLANEOUS) ×2 IMPLANT
DERMABOND ADVANCED .7 DNX12 (GAUZE/BANDAGES/DRESSINGS) ×2 IMPLANT
DRAIN PENROSE 0.5X18 (DRAIN) IMPLANT
DRAPE LAPAROTOMY TRNSV 102X78 (DRAPES) ×2 IMPLANT
DRAPE UTILITY XL STRL (DRAPES) ×2 IMPLANT
DRSG TELFA PLUS 4X6 ADH ISLAND (GAUZE/BANDAGES/DRESSINGS) IMPLANT
ELECT REM PT RETURN 15FT ADLT (MISCELLANEOUS) ×2 IMPLANT
GAUZE SPONGE 4X4 12PLY STRL (GAUZE/BANDAGES/DRESSINGS) IMPLANT
GLOVE BIOGEL PI IND STRL 7.0 (GLOVE) IMPLANT
GLOVE SURG SS PI 7.0 STRL IVOR (GLOVE) ×2 IMPLANT
GOWN STRL REUS W/ TWL LRG LVL3 (GOWN DISPOSABLE) ×2 IMPLANT
GOWN STRL REUS W/ TWL XL LVL3 (GOWN DISPOSABLE) IMPLANT
GOWN STRL REUS W/TWL LRG LVL3 (GOWN DISPOSABLE) ×1
GOWN STRL REUS W/TWL XL LVL3 (GOWN DISPOSABLE)
KIT BASIN OR (CUSTOM PROCEDURE TRAY) ×2 IMPLANT
KIT TURNOVER KIT A (KITS) IMPLANT
MARKER SKIN DUAL TIP RULER LAB (MISCELLANEOUS) ×2 IMPLANT
MESH HERNIA 3X6 (Mesh General) IMPLANT
NEEDLE HYPO 22GX1.5 SAFETY (NEEDLE) ×2 IMPLANT
PACK BASIC VI WITH GOWN DISP (CUSTOM PROCEDURE TRAY) ×2 IMPLANT
PENCIL SMOKE EVACUATOR (MISCELLANEOUS) ×2 IMPLANT
SPIKE FLUID TRANSFER (MISCELLANEOUS) ×2 IMPLANT
SPONGE T-LAP 4X18 ~~LOC~~+RFID (SPONGE) ×2 IMPLANT
STRIP CLOSURE SKIN 1/2X4 (GAUZE/BANDAGES/DRESSINGS) IMPLANT
SUT MNCRL AB 4-0 PS2 18 (SUTURE) ×2 IMPLANT
SUT PDS AB 2-0 CT2 27 (SUTURE) ×2 IMPLANT
SUT PROLENE 2 0 CT2 30 (SUTURE) ×4 IMPLANT
SUT VIC AB 3-0 SH 18 (SUTURE) ×2 IMPLANT
SUT VIC AB 3-0 SH 27 (SUTURE) ×1
SUT VIC AB 3-0 SH 27XBRD (SUTURE) IMPLANT
SYR BULB IRRIG 60ML STRL (SYRINGE) ×2 IMPLANT
SYR CONTROL 10ML LL (SYRINGE) ×2 IMPLANT
TOWEL OR 17X26 10 PK STRL BLUE (TOWEL DISPOSABLE) ×2 IMPLANT
TOWEL OR NON WOVEN STRL DISP B (DISPOSABLE) ×2 IMPLANT
YANKAUER SUCT BULB TIP 10FT TU (MISCELLANEOUS) IMPLANT

## 2022-02-07 NOTE — Transfer of Care (Signed)
Immediate Anesthesia Transfer of Care Note  Patient: Miguel Wolfe  Procedure(s) Performed: OPEN RIGHT INGUINAL HERNIA REPAIR WITH MESH (Right)  Patient Location: PACU  Anesthesia Type:General  Level of Consciousness: awake, alert , and oriented  Airway & Oxygen Therapy: Patient Spontanous Breathing and Patient connected to face mask oxygen  Post-op Assessment: Report given to RN and Post -op Vital signs reviewed and stable  Post vital signs: Reviewed and stable  Last Vitals:  Vitals Value Taken Time  BP 128/70 02/07/22 0843  Temp    Pulse 57 02/07/22 0845  Resp 13 02/07/22 0845  SpO2 100 % 02/07/22 0845  Vitals shown include unvalidated device data.  Last Pain:  Vitals:   02/07/22 0609  TempSrc:   PainSc: 0-No pain         Complications: No notable events documented.

## 2022-02-07 NOTE — Op Note (Signed)
Preop diagnosis: right inguinal hernia  Postop diagnosis: right inguinal hernia  Procedure: open Right inguinal hernia repair with mesh  Surgeon: Gurney Maxin, M.D.  Asst: none  Anesthesia: Gen.   Indications for procedure: [name] is a 77 y.o. male with symptoms of pain and enlarging Right inguinal hernia(s). After discussing risks, alternatives and benefits he decided on open repair and was brought to day surgery for repair.  Description of procedure: The patient was brought into the operative suite, placed supine. Anesthesia was administered with endotracheal tube. Patient was strapped in place. The patient was prepped and draped in the usual sterile fashion.  The anterior superior iliac spine and pubic tubercle were identified on the Right side. An incision was made 1cm above the connecting line, representative of the location of the inguinal ligament. The subcutaneous tissue was bluntly dissected, scarpa's fascia was dissected away. The external abdominal oblique fascia was identified and sharply opened down to the external inguinal ring. The conjoint tendon and inguinal ligament were identified. The cord structures and sac were dissected free of the surrounding tissue in 360 degrees. A penrose drain was used to encircle the contents. The cremasteric fibers were dissected free of the contents of the cord and hernia sac. The cord structures (vessels and vas deferens) were identified and carefully dissected away from the hernia sac. The hernia sac was reduced and contained no visceral structures.The hernia sac was dissected down to the internal inguinal ring. Preperitoneal fat was identified showing appropriate dissection. The sac was then reduced into the preperitoneal space. The hernia was indirect. A 3x6 Bard mesh was then used to close the defect and reinforce the floor. The mesh was sutured to the lacunar ligament and inguinal ligament using a 2-0 prolene in running fashion. Next the  superior edge of the mesh was sutured to the conjoined tendon using a 2-0 running Prolene. An additional 2-0 Prolene was used to suture the tail ends of the mesh together re-creating the deep ring. Cord structures are running in a neutral position through the mesh. Next the external abdominal oblique fascia was closed with a 2-0 Vicryl in interrupted fashion to re-create the external inguinal ring. Scarpa's fascia was closed with 3-0 Vicryl in running fashion. Skin was closed with a 4-0 Monocryl subcuticular stitch in running fashion. Dermabond place for dressing. Patient woke from anesthesia and brought to PACU in stable condition. All counts are correct.  Findings: right indirect inguinal hernia  Specimen: none  Blood loss: 15 ml  Local anesthesia: 10 ml Marc[place]  Complications: none  Implant: 3 x 6 Bard mesh  Gurney Maxin, M.D. General, Bariatric, & Minimally Invasive Surgery [facility] Surgery, [place] 8:48 AM 02/07/2022

## 2022-02-07 NOTE — H&P (Signed)
Chief Complaint: Inguinal Hernia   History of Present Illness: Miguel Wolfe is a 77 y.o. male who is seen today as an office consultation at the request of Dr. Cato Mulligan for evaluation of Inguinal Hernia .   He first noticed the hernia 4 months ago. Symptoms are discomfort with activity. He denies nausea or vomiting or bowel habit change.  He does not smoke He does not have diabetes He has no history of hernias  Review of Systems: A complete review of systems was obtained from the patient. I have reviewed this information and discussed as appropriate with the patient. See HPI as well for other ROS.  Review of Systems  Constitutional: Negative.  HENT: Negative.  Eyes: Negative.  Respiratory: Negative.  Cardiovascular: Negative.  Gastrointestinal: Negative.  Genitourinary: Negative.  Musculoskeletal: Negative.  Skin: Negative.  Neurological: Negative.  Endo/Heme/Allergies: Negative.  Psychiatric/Behavioral: Negative.   Medical History: Past Medical History:  Diagnosis Date  Arthritis  GERD (gastroesophageal reflux disease)  History of cancer  Thyroid disease   There is no problem list on file for this patient.  Past Surgical History:  Procedure Laterality Date  THYROIDECTOMY TOTAL 01/17/2018  Dr. Harlow Asa  VIDEO ASSISTED THORACOSCOPY (VATS)/LUNG RESECTION (Left: Chest) 06/24/2018  Dr. Servando Snare  OPEN REDUCTION INTERNAL FIXATION OF LUMBAR ONE FRACTURE WITH PLACEMENT OF PERCUTANEOUS SCREWS THORACIC ELEVEN-LUMBAR THREE 11/14/2021  Dr. Kathyrn Sheriff   Allergies  Allergen Reactions  Gemfibrozil Muscle Pain and Other (See Comments)  Muscle pain  Niacin Other (See Comments), Rash and Unknown  Flushing - aspirin did not mitigate  Simvastatin Other (See Comments)  Drug-Drug interaction with amlodipine   Current Outpatient Medications on File Prior to Visit  Medication Sig Dispense Refill  amLODIPine (NORVASC) 10 MG tablet Take 10 mg by mouth once daily  carvediloL (COREG)  6.25 MG tablet Take by mouth  ezetimibe (ZETIA) 10 mg tablet Take 10 mg by mouth once daily  LORazepam (ATIVAN) 0.5 MG tablet Take 1 tablet 30 minutes prior to radiation procedures or MRIs.  pravastatin (PRAVACHOL) 80 MG tablet TAKE 1 TABLET(80 MG) BY MOUTH DAILY  acetaminophen (TYLENOL) 325 MG tablet Take by mouth  aspirin 81 MG chewable tablet 1 tablet Orally Once a day  COQ10, UBIQUINOL, ORAL Take by mouth  famotidine (PEPCID) 20 MG tablet Take 20 mg by mouth once daily  ibuprofen (MOTRIN) 200 MG tablet Take by mouth  levothyroxine (SYNTHROID) 150 MCG tablet Take by mouth  losartan (COZAAR) 100 MG tablet Take 100 mg by mouth once daily  nitroGLYcerin (NITRODUR) 0.4 mg/hr patch Place onto the skin  omega-3 fatty acids/fish oil (FISH OIL) 340-1,000 mg capsule Take by mouth  pantoprazole (PROTONIX) 40 MG DR tablet  zolpidem (AMBIEN) 10 mg tablet   No current facility-administered medications on file prior to visit.   Family History  Problem Relation Age of Onset  Deep vein thrombosis (DVT or abnormal blood clot formation) Mother  Hyperlipidemia (Elevated cholesterol) Father  Coronary Artery Disease (Blocked arteries around heart) Father   Social History   Tobacco Use  Smoking Status Former  Types: Cigarettes  Quit date: 1970  Years since quitting: 54.0  Smokeless Tobacco Never   Social History   Socioeconomic History  Marital status: Married  Tobacco Use  Smoking status: Former  Types: Cigarettes  Quit date: 1970  Years since quitting: 54.0  Smokeless tobacco: Never  Substance and Sexual Activity  Alcohol use: Yes  Comment: 1-2 daily  Drug use: Never   Objective:   Vitals:  01/11/22  1340  BP: 130/72  Pulse: 75  Temp: 36.8 C (98.3 F)  SpO2: 98%  Weight: 72.2 kg (159 lb 3.2 oz)  Height: 180.3 cm (5\' 11" )   Body mass index is 22.2 kg/m.  Physical Exam Constitutional:  Appearance: Normal appearance.  HENT:  Head: Normocephalic and atraumatic.   Pulmonary:  Effort: Pulmonary effort is normal.  Abdominal:  Comments: Moderate right inguinal hernia  Musculoskeletal:  General: Normal range of motion.  Cervical back: Normal range of motion.  Neurological:  General: No focal deficit present.  Mental Status: He is alert and oriented to person, place, and time. Mental status is at baseline.  Psychiatric:  Mood and Affect: Mood normal.  Behavior: Behavior normal.  Thought Content: Thought content normal.     Labs, Imaging and Diagnostic Testing: I reviewed notes by Ferne Reus  Assessment and Plan:  Diagnoses and all orders for this visit:  Non-recurrent unilateral inguinal hernia without obstruction or gangrene    We discussed etiology of hernias and how they can cause pain. We discussed options for inguinal hernia repair vs observation. We discussed details of the surgery of general anesthesia, surgical approach and incisions, dissecting the sack away from vas deference, testicular vessels and nerves and placement of mesh. We discussed risks of bleeding, infection, recurrence, injury to vas deference, testicular vessels, nerve injury, and chronic pain. He showed good understanding and wanted proceed with open right inguinal hernia repair as outpatient.

## 2022-02-07 NOTE — Discharge Instructions (Signed)
[facility]  UMBILICAL OR INGUINAL HERNIA REPAIR: POST OP INSTRUCTIONS  Always review your discharge instruction sheet given to you by the facility where your surgery was performed. IF YOU HAVE DISABILITY OR FAMILY LEAVE FORMS, YOU MUST BRING THEM TO THE OFFICE FOR PROCESSING.   DO NOT GIVE THEM TO YOUR DOCTOR.  1. A  prescription for pain medication may be given to you upon discharge.  Take your pain medication as prescribed, if needed.  If narcotic pain medicine is not needed, then you may take acetaminophen (Tylenol) or ibuprofen (Advil) as needed. 2. Take your usually prescribed medications unless otherwise directed. If you need a refill on your pain medication, please contact your pharmacy.  They will contact our office to request authorization. Prescriptions will not be filled after 5 pm or on week-ends. 3. You should follow a light diet the first 24 hours after arrival home, such as soup and crackers, etc.  Be sure to include lots of fluids daily.  Resume your normal diet the day after surgery. 4.Most patients will experience some swelling and bruising around the umbilicus or in the groin and scrotum.  Ice packs and reclining will help.  Swelling and bruising can take several days to resolve.  6. It is common to experience some constipation if taking pain medication after surgery.  Increasing fluid intake and taking a stool softener (such as Colace) will usually help or prevent this problem from occurring.  A mild laxative (Milk of Magnesia or Miralax) should be taken according to package directions if there are no bowel movements after 48 hours. 7. Unless discharge instructions indicate otherwise, you may remove your bandages 24-48 hours after surgery, and you may shower at that time.  You may have steri-strips (small skin tapes) in place directly over the incision.  These strips should be left on the skin for 7-10 days.  If your surgeon used skin glue on the  incision, you may shower in 24 hours.  The glue will flake off over the next 2-3 weeks.  Any sutures or staples will be removed at the office during your follow-up visit. 8. ACTIVITIES:  You may resume regular (light) daily activities beginning the next day--such as daily self-care, walking, climbing stairs--gradually increasing activities as tolerated.  You may have sexual intercourse when it is comfortable.  Refrain from any heavy lifting or straining until approved by your doctor.  a.You may drive when you are no longer taking prescription pain medication, you can comfortably wear a seatbelt, and you can safely maneuver your car and apply brakes. b.RETURN TO WORK:   _____________________________________________  9.You should see your doctor in the office for a follow-up appointment approximately 2-3 weeks after your surgery.  Make sure that you call for this appointment within a day or two after you arrive home to insure a convenient appointment time. 10.OTHER INSTRUCTIONS: _________________________    _____________________________________  WHEN TO CALL YOUR DOCTOR: Fever over 101.0 Inability to urinate Nausea and/or vomiting Extreme swelling or bruising Continued bleeding from incision. Increased pain, redness, or drainage from the incision  The clinic staff is available to answer your questions during regular business hours.  Please don't hesitate to call and ask to speak to one of the nurses for clinical concerns.  If you have a medical emergency, go to the nearest emergency room or call 911.  A surgeon from [facility] is always on call at the hospital   [address], [address],   [place], [place]  [address] ?  [address] [address], [place], [place]   [address] [phone] ? [phone] ? FAX [phone] Web site: [email or web address]  

## 2022-02-07 NOTE — Anesthesia Postprocedure Evaluation (Signed)
Anesthesia Post Note  Patient: Miguel Wolfe  Procedure(s) Performed: OPEN RIGHT INGUINAL HERNIA REPAIR WITH MESH (Right)     Patient location during evaluation: PACU Anesthesia Type: General Level of consciousness: awake and alert Pain management: pain level controlled Vital Signs Assessment: post-procedure vital signs reviewed and stable Respiratory status: spontaneous breathing, nonlabored ventilation, respiratory function stable and patient connected to nasal cannula oxygen Cardiovascular status: blood pressure returned to baseline and stable Postop Assessment: no apparent nausea or vomiting Anesthetic complications: no   No notable events documented.  Last Vitals:  Vitals:   02/07/22 0900 02/07/22 0915  BP: 114/70 122/72  Pulse: 63 63  Resp: 18 16  Temp:  36.4 C  SpO2: 97% 96%    Last Pain:  Vitals:   02/07/22 0915  TempSrc:   PainSc: 0-No pain                 Mikaeel Petrow

## 2022-02-07 NOTE — Anesthesia Procedure Notes (Signed)
Procedure Name: Intubation Date/Time: 02/07/2022 7:34 AM  Performed by: [place] Manus Rudd, CRNAPre-anesthesia Checklist: Patient identified, Emergency Drugs available, Suction available and Patient being monitored Patient Re-evaluated:Patient Re-evaluated prior to induction Oxygen Delivery Method: Circle system utilized Preoxygenation: Pre-oxygenation with 100% oxygen Induction Type: IV induction Ventilation: Mask ventilation without difficulty Laryngoscope Size: Glidescope, Mac and 4 Grade View: Grade I Tube type: Oral Tube size: 7.5 mm Number of attempts: 1 Airway Equipment and Method: Video-laryngoscopy and Rigid stylet Placement Confirmation: ETT inserted through vocal cords under direct vision, positive ETCO2 and breath sounds checked- equal and bilateral Secured at: 22 cm Tube secured with: Tape Dental Injury: Teeth and Oropharynx as per pre-operative assessment  Comments: 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 Tube inserted easily and atraumatically

## 2022-02-07 NOTE — Anesthesia Procedure Notes (Signed)
Anesthesia Regional Block: TAP block   Pre-Anesthetic Checklist: , timeout performed,  Correct Patient, Correct Site, Correct Laterality,  Correct Procedure, Correct Position, site marked,  Risks and benefits discussed,  Surgical consent,  Pre-op evaluation,  At surgeon's request and post-op pain management  Laterality: Right  Prep: chloraprep       Needles:  Injection technique: Single-shot  Needle Type: Echogenic Stimulator Needle     Needle Length: 5cm  Needle Gauge: 22     Additional Needles:   Procedures:, nerve stimulator,,, ultrasound used (permanent image in chart),,    Narrative:  Start time: 02/07/2022 7:20 AM End time: 02/07/2022 7:25 AM Injection made incrementally with aspirations every 5 mL.  Performed by: Personally  Anesthesiologist: Janeece Riggers, MD  Additional Notes: Functioning IV was confirmed and monitors were applied.  A 62mm 22ga Arrow echogenic stimulator needle was used. Sterile prep and drape,hand hygiene and sterile gloves were used. Ultrasound guidance: relevant anatomy identified, needle position confirmed, local anesthetic spread visualized around nerve(s)., vascular puncture avoided.  Image printed for medical record. Negative aspiration and negative test dose prior to incremental administration of local anesthetic. The patient tolerated the procedure well.

## 2022-02-08 ENCOUNTER — Encounter (HOSPITAL_COMMUNITY): Payer: Self-pay | Admitting: General Surgery

## 2022-02-15 DIAGNOSIS — Z85850 Personal history of malignant neoplasm of thyroid: Secondary | ICD-10-CM | POA: Diagnosis not present

## 2022-02-15 DIAGNOSIS — E890 Postprocedural hypothyroidism: Secondary | ICD-10-CM | POA: Diagnosis not present

## 2022-02-20 DIAGNOSIS — Z9889 Other specified postprocedural states: Secondary | ICD-10-CM | POA: Diagnosis not present

## 2022-02-20 DIAGNOSIS — E890 Postprocedural hypothyroidism: Secondary | ICD-10-CM | POA: Diagnosis not present

## 2022-02-20 DIAGNOSIS — R499 Unspecified voice and resonance disorder: Secondary | ICD-10-CM | POA: Diagnosis not present

## 2022-02-20 DIAGNOSIS — R7303 Prediabetes: Secondary | ICD-10-CM | POA: Diagnosis not present

## 2022-02-20 DIAGNOSIS — Z85850 Personal history of malignant neoplasm of thyroid: Secondary | ICD-10-CM | POA: Diagnosis not present

## 2022-02-22 NOTE — Progress Notes (Signed)
Miguel Wolfe presents today for follow up after completing Hir[facility] to L1 on 01/24/2022  Recent neurologic symptoms, if any:  Seizures: no Headaches: no Nausea: no Dizziness/ataxia: no Difficulty with hand coordination: no Focal numbness/weakness: no Visual deficits/changes: no Confusion/Memory deficits: no  Other issues of note: None currently  This concludes the interview.   Leandra Kern, LPN

## 2022-02-22 NOTE — Progress Notes (Unsigned)
Cardiology Clinic Note   Patient Name: Miguel Wolfe Date of Encounter: 02/26/2022  Primary Care Provider:  [name], MD Primary Cardiologist:  Candee Furbish, MD  Patient Miguel Wolfe is a 77 y.o. male with a past medical history of CAD s/p PCI PTCA/DES mid RCA 2018, aortic insufficiency and mild dilation of aorta, hypertension, hyperlipidemia, left upper lobe lung cancer s/p upper lobectomy 2020, GERD who presents to the clinic today for 6 month follow-up.   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   PONV (postoperative nausea and vomiting)    Pre-diabetes    Pulmonary nodules    Thyroid cancer ([place])    thyroid removed   Past Surgical History:  Procedure Laterality Date   BACK SURGERY     CAROTID ENDARTERECTOMY     COLONOSCOPY WITH ESOPHAGOGASTRODUODENOSCOPY (EGD)  2008   CORONARY STENT INTERVENTION N/A 02/20/2016   Procedure: Coronary Stent Intervention;  Surgeon: Burnell Blanks, MD;  Location: [facility];  Service: Cardiovascular;  Laterality: N/A;   ESOPHAGOGASTRODUODENOSCOPY     INGUINAL HERNIA REPAIR Right 02/07/2022   Procedure: OPEN RIGHT INGUINAL HERNIA REPAIR WITH MESH;  Surgeon: Kinsinger, Arta Bruce, MD;  Location: [facility];  Service: General;  Laterality: Right;   LEFT HEART CATH AND CORONARY ANGIOGRAPHY N/A 02/20/2016   Procedure: Left Heart Cath and Coronary Angiography;   Surgeon: Burnell Blanks, MD;  Location: [facility];  Service: Cardiovascular;  Laterality: N/A;   LUNG REMOVAL, PARTIAL Left 06/24/2018   Pt states "1/2 of lung was removed"   PILONIDAL CYST EXCISION     REFRACTIVE SURGERY Right    retina tear repair   THYROIDECTOMY N/A 01/17/2018   Procedure: TOTAL THYROIDECTOMY;  Surgeon: Armandina Gemma, MD;  Location: [facility];  Service: General;  Laterality: N/A;   VIDEO ASSISTED THORACOSCOPY (VATS)/WEDGE RESECTION Left 06/24/2018   Procedure: VIDEO ASSISTED THORACOSCOPY (VATS)/LUNG RESECTION;  Surgeon: Grace Isaac, MD;  Location: [facility];  Service: Thoracic;  Laterality: Left;   VIDEO BRONCHOSCOPY WITH ENDOBRONCHIAL NAVIGATION N/A 11/18/2017   Procedure: VIDEO BRONCHOSCOPY WITH ENDOBRONCHIAL NAVIGATION WITH BIOPSIES OF LEFT AND RIGHT UPPER LOBES;  Surgeon: Grace Isaac, MD;  Location: [facility];  Service: Thoracic;  Laterality: N/A;   VIDEO BRONCHOSCOPY WITH ENDOBRONCHIAL NAVIGATION N/A 06/11/2018   Procedure: VIDEO BRONCHOSCOPY WITH ENDOBRONCHIAL NAVIGATION;  Surgeon: Grace Isaac, MD;  Location: [facility];  Service: Thoracic;  Laterality: N/A;    Allergies  Allergies  Allergen Reactions   Gemfibrozil Other (See Comments)    Muscle pain   Propranolol     Other reaction(s): SOB  Only had issue with Slow release propranolol   Niaspan [Niacin Er] Rash and Other (See Comments)  Flushing - aspirin did not mitigate    Simvastatin Other (See Comments)    Drug-Drug interaction with amlodipine    History of Present Miguel Wolfe has a past medical history of: CAD.  LHC 02/20/2016 (abnormal nuclear stress test): Acute marginal 90%.  Mid RCA 20%.  Distal RCA 30%.  Proximal to mid RCA 80%.  Proximal to mid CX 20%.  D1 20%.  Distal LAD 20%.  PTCA/DES x 1 mid RCA.  Mild nonobstructive disease CX and LAD. Nuclear stress test 02/16/2021: Normal, low risk study. Aortic insufficiency/aortic dilatation. Echo 05/16/2021: EF 60  to 65%.  Grade I DD.  Mild to moderate AR.  Aortic valve sclerosis without stenosis.  Mild dilatation of aortic root, 41 mm.  Mild dilatation of ascending aorta, 41 mm.  Unchanged from previous in 2022. Hypertension.  Hyperlipidemia.  Lipid panel 12/25/2021: LDL 50, HDL 64, TG 116, total 134.  Intolerant of atorvastatin and rosuvastatin. Left upper lobe CA.  S/p left upper lobectomy 2020. GERD.   Miguel Wolfe was first evaluated by Dr. Marlou Porch on 02/06/2016 for dyspnea on exertion at the request of Dr. [name].  At that time he complained of a several month history of fatigue, shortness of breath, and decreased energy particularly when doing yard work.  He he found 1 hour of yard work challenging, however he was able to walk his neighborhood normally without any symptoms.  He stopped taking statin secondary to myalgias.  Patient underwent nuclear stress testing was consistent with prior inferolateral MI with peri-infarct ischemia.  He underwent LHC with PTCA/DES x 1 to mid RCA as outlined above.  Patient was last seen in the office by Dr. Marlou Porch on 08/07/2021.  He was doing well at that time and no medication changes were made.  Today, patient is doing well. 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 He also had hernia repair about three weeks ago. He has not been able to start physical therapy for his back secondary to hernia surgery but he follows up this Friday and will hopefully be released to begin physical therapy. He walks daily with his wife and gets approximately 7000-8000 steps a day. Patient denies shortness of breath or dyspnea on exertion. No chest pain, pressure, or tightness. Denies lower extremity edema, orthopnea, or PND. No palpitations. His PCP recently checked lipids in December. He is also being followed by PCP for low platelet count.    Home Medications    Current Meds  Medication Sig   acetaminophen  (TYLENOL) 325 MG tablet Take 650 mg by mouth every 6 (six) hours as needed (Back pain).   amLODipine (NORVASC) 10 MG tablet Take 10 mg by mouth daily.   aspirin EC 81 MG tablet Take 81 mg by mouth daily.   carvedilol (COREG) 6.25 MG tablet TAKE 1 TABLET(6.25 MG) BY MOUTH TWICE DAILY WITH A MEAL   Coenzyme Q10 (COQ10) 100 MG CAPS Take 100 mg by mouth daily.   ezetimibe (ZETIA) 10 MG tablet TAKE 1 TABLET(10 MG) BY MOUTH DAILY   famotidine (PEPCID) 20 MG tablet Take 20 mg by mouth daily.   fluticasone (FLONASE) 50 MCG/ACT nasal spray Place 1 spray into both nostrils daily as needed for allergies or rhinitis (Spring and Fall).   ibuprofen (ADVIL) 800 MG tablet Take 1 tablet (800 mg total) by mouth every 8 (eight) hours as needed.   levothyroxine (SYNTHROID)  150 MCG tablet Take 150 mcg by mouth daily before breakfast.   losartan (COZAAR) 100 MG tablet Take 100 mg by mouth daily.   nitroGLYCERIN (NITROSTAT) 0.4 MG SL tablet PLACE 1 TABLET UNDER THE TONGUE EVERY 5 MINUTES AS NEEDED FOR CHEST PAIN   Omega-3 Fatty Acids (FISH OIL) 1000 MG CAPS Take 2 capsules by mouth in the morning and at bedtime.   pantoprazole (PROTONIX) 40 MG tablet Take 40 mg by mouth daily.   pravastatin (PRAVACHOL) 80 MG tablet TAKE 1 TABLET(80 MG) BY MOUTH DAILY   zolpidem (AMBIEN) 10 MG tablet Take 10 mg by mouth at bedtime.   [DISCONTINUED] LORazepam (ATIVAN) 0.5 MG tablet Take 1 tablet night before the MRI and take 1 tablet day of MRI. (Patient taking differently: as needed. Take 1 tablet night before the MRI and take 1 tablet day of MRI.)   [DISCONTINUED] ondansetron (ZOFRAN) 8 MG tablet Take 1 tablet (8 mg total) by mouth every 8 (eight) hours as needed for nausea or vomiting.   [DISCONTINUED] oxyCODONE (OXY IR/ROXICODONE) 5 MG immediate release tablet Take 1 tablet (5 mg total) by mouth every 6 (six) hours as needed for up to 30 doses for severe pain. 1-2 Tabs PO q6h PRN pain    Family History    Family History  Problem  Relation Age of Onset   Heart attack Father 76       BYPASS   Crohn's disease Sister    Parkinsonism Sister    Healthy Sister    Stomach cancer Paternal Uncle        diagnosed mid-30s   Throat cancer Paternal Uncle        hx of smoking   Cancer Paternal Uncle        unknown type   Cancer Paternal Uncle        unknown type   Thyroid cancer Daughter        papillary   Colon cancer Cousin 33       paternal cousin   He indicated that his mother is alive. He indicated that his father is deceased. He indicated that both of his sisters are alive. He indicated that his maternal grandmother is deceased. He indicated that his maternal grandfather is deceased. He indicated that his paternal grandmother is deceased. He indicated that his paternal grandfather is deceased. He indicated that both of his daughters are alive. He indicated that his maternal aunt is deceased. He indicated that only one of his two maternal uncles is alive. He indicated that his paternal aunt is deceased. He indicated that four of his five paternal uncles are deceased. He indicated that his cousin is deceased.   Social History    Social History   Socioeconomic History   Marital status: Married    Spouse name: Not on file   Number of children: 2   Years of education: Not on file   Highest education level: Not on file  Occupational History   Not on file  Tobacco Use   Smoking status: Former    Packs/day: 1.00    Years: 3.00    Total pack years: 3.00    Types: Cigarettes    Quit date: 02/01/1967    Years since quitting: 55.1   Smokeless tobacco: Never  Vaping Use   Vaping Use: Never used  Substance and Sexual Activity   Alcohol use: Yes    Comment: a drink before dinner a few times a week (wine or a mixed drink)  Drug use: No   Sexual activity: Not on file  Other Topics Concern   Not on file  Social History Narrative   Not on file   Social Determinants of Health   Financial Resource Strain: Not on  file  Food Insecurity: Not on file  Transportation Needs: Not on file  Physical Activity: Not on file  Stress: Not on file  Social Connections: Not on file  Intimate Partner Violence: Not on file     Review of Systems    General:  No chills, fever, night sweats or weight changes.  Cardiovascular:  No chest pain, dyspnea on exertion, edema, orthopnea, palpitations, paroxysmal nocturnal dyspnea. Dermatological: No rash, lesions/masses Respiratory: No cough, dyspnea Urologic: No hematuria, dysuria Abdominal:   No nausea, vomiting, diarrhea, bright red blood per rectum, melena, or hematemesis Neurologic:  No visual changes, weakness, changes in mental status. All other systems reviewed and are otherwise negative except as noted above.  Physical Exam    VS:  BP 118/70   Pulse 74   Ht 5\' 11"  (1.803 m)   Wt 156 lb 3.2 oz (70.9 kg)   SpO2 97%   BMI 21.79 kg/m  , BMI Body mass index is 21.79 kg/m. GEN:  Well nourished, well developed, in no acute distress. HEENT: Normal. Neck: Supple, no JVD, carotid bruits, or masses. Cardiac: RRR, no murmurs, rubs, or gallops. No clubbing, cyanosis, edema.  Radials/DP/PT 2+ and equal bilaterally.  Respiratory:  Respirations regular and unlabored, clear to auscultation bilaterally. GI: Soft, nontender, nondistended. MS: No deformity or atrophy. Skin: Warm and dry, no rash. Neuro: Strength and sensation are intact. Psych: Normal affect.  Accessory Clinical Findings    Recent Labs: 03/13/2021: ALT 18 02/05/2022: BUN 14; Creatinine, Ser 0.70; Hemoglobin 14.7; Platelets 130; Potassium 3.8; Sodium 140   Recent Lipid Panel    Component Value Date/Time   CHOL 131 03/13/2021 0753   TRIG 99 03/13/2021 0753   HDL 50 03/13/2021 0753   CHOLHDL 2.6 03/13/2021 0753   LDLCALC 63 03/13/2021 0753       ECG personally reviewed by me today: NSR, rate 73 bpm.  No significant changes from 01/25/2021.    Assessment & Plan   CAD.  S/p PTCA/DES x 1 mid RCA  February 2018.  Nuclear stress test February 2023 was normal, low risk study.  Patient denies chest pain, tightness or pressure. He walks 7000-8000 a day without difficulty. Continue aspirin, carvedilol, Zetia, pravastatin, as needed SL NTG. Aortic insufficiency/aortic dilatation.  Echo May 2023 showed EF 60 to 65%, grade 1 DD, mild to moderate AR, mild dilatation of aortic root 41 mm, mild dilatation of ascending aorta 41 mm.  Unchanged from 2022.  Patient denies lower extremity edema, DOE, orthopnea or PND. He is scheduled for CT chest ordered by oncology a the end of February.  Though not a gated study for his aorta, may at least discern any more moderate concerns. Otherwise will order repeat echo for May 2024. Hypertension.  BP today 118/70. Patient denies headaches or dizziness.  Continue amlodipine, carvedilol, and losartan. Hyperlipidemia.  LDL December 2023 50, at goal.  Continue Pravachol and Zetia.   Disposition: Echo for aortic insufficiency May 2024. Return in 6 months or sooner as needed.    Justice Britain. Juliano Mceachin, DNP, NP-C     02/26/2022, 9:54 AM [place] [address] Office [phone] Fax [phone]

## 2022-02-26 ENCOUNTER — Encounter: Payer: Self-pay | Admitting: Student

## 2022-02-26 ENCOUNTER — Ambulatory Visit: Payer: Medicare Other | Attending: Physician Assistant | Admitting: Student

## 2022-02-26 VITALS — BP 118/70 | HR 74 | Ht 71.0 in | Wt 156.2 lb

## 2022-02-26 DIAGNOSIS — E785 Hyperlipidemia, unspecified: Secondary | ICD-10-CM | POA: Diagnosis not present

## 2022-02-26 DIAGNOSIS — I2510 Atherosclerotic heart disease of native coronary artery without angina pectoris: Secondary | ICD-10-CM | POA: Diagnosis not present

## 2022-02-26 DIAGNOSIS — I10 Essential (primary) hypertension: Secondary | ICD-10-CM | POA: Insufficient documentation

## 2022-02-26 DIAGNOSIS — I351 Nonrheumatic aortic (valve) insufficiency: Secondary | ICD-10-CM | POA: Insufficient documentation

## 2022-02-26 NOTE — Patient Instructions (Signed)
Medication Instructions:  Your physician recommends that you continue on your current medications as directed. Please refer to the Current Medication list given to you today.  *If you need a refill on your cardiac medications before your next appointment, please call your pharmacy*   Lab Work: None ordered If you have labs (blood work) drawn today and your tests are completely normal, you will receive your results only by: [place] (if you have MyChart) OR A paper copy in the mail If you have any lab test that is abnormal or we need to change your treatment, we will call you to review the results.   Testing/Procedures:  Your physician has requested that you have an echocardiogram IN MAY 2024. Echocardiography is a painless test that uses sound waves to create images of your heart. It provides your doctor with information about the size and shape of your heart and how well your heart's chambers and valves are working. This procedure takes approximately one hour. There are no restrictions for this procedure. Please do NOT wear cologne, perfume, aftershave, or lotions (deodorant is allowed). Please arrive 15 minutes prior to your appointment time.    Follow-Up: At [facility], you and your health needs are our priority.  As part of our continuing mission to provide you with exceptional heart care, we have created designated Provider Care Teams.  These Care Teams include your primary Cardiologist (physician) and Advanced Practice Providers (APPs -  Physician Assistants and Nurse Practitioners) who all work together to provide you with the care you need, when you need it.  We recommend signing up for the patient portal called "MyChart".  Sign up information is provided on this After Visit Summary.  MyChart is used to connect with patients for Virtual Visits (Telemedicine).  Patients are able to view lab/test results, encounter notes, upcoming appointments, etc.  Non-urgent  messages can be sent to your provider as well.   To learn more about what you can do with MyChart, go to [email or web address].    Your next appointment:   6 month(s)  Provider:   Candee Furbish, MD     Other Instructions

## 2022-02-27 ENCOUNTER — Encounter: Payer: Self-pay | Admitting: Radiation Oncology

## 2022-02-27 ENCOUNTER — Ambulatory Visit
Admission: RE | Admit: 2022-02-27 | Discharge: 2022-02-27 | Disposition: A | Discharge status: Home / Self Care | Payer: Medicare Other | Source: Ambulatory Visit | Attending: Radiation Oncology | Admitting: Radiation Oncology

## 2022-02-27 VITALS — BP 126/72 | HR 67 | Temp 96.2°F | Resp 18 | Ht 71.0 in | Wt 157.5 lb

## 2022-02-27 DIAGNOSIS — D1809 Hemangioma of other sites: Secondary | ICD-10-CM | POA: Insufficient documentation

## 2022-02-27 NOTE — Progress Notes (Addendum)
Radiation Oncology         [phone] ________________________________  Name: Miguel Wolfe MRN: [id number]  Date: 02/27/2022  DOB: 10-02-1945  Follow-Up Visit Note  Outpatient  CC: [name], MD  Consuella Lose, MD  Diagnosis:     ICD-10-CM   1. 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      Patient is still experiencing a significant amount of back pain that has been an issue since surgery. He rates his pain 6/10. This pain is worse with sitting and wakes him up every 2-3 hours every night. He is taking ibuprofen and acetaminophen mainly for pain. He does have tramadol that he will use occasionally. He denies any GI upset or sequelae arising since radiation treatment   ALLERGIES:  is allergic to gemfibrozil, propranolol, niaspan [niacin er], and simvastatin.  Meds: Current Outpatient Medications  Medication Sig Dispense Refill   acetaminophen (TYLENOL) 325 MG tablet Take 650 mg by mouth every 6 (six) hours as needed (Back pain).     amLODipine (NORVASC) 10 MG tablet Take 10 mg by mouth daily.     aspirin EC 81 MG tablet Take 81 mg by mouth daily.     carvedilol (COREG) 6.25 MG tablet TAKE 1 TABLET(6.25 MG) BY MOUTH TWICE DAILY WITH A MEAL 180 tablet 3   Coenzyme Q10 (COQ10) 100 MG CAPS Take 100 mg by mouth daily.     ezetimibe (ZETIA) 10 MG tablet TAKE 1 TABLET(10 MG) BY MOUTH DAILY 90 tablet 3    famotidine (PEPCID) 20 MG tablet Take 20 mg by mouth daily.     fluticasone (FLONASE) 50 MCG/ACT nasal spray Place 1 spray into both nostrils daily as needed for allergies or rhinitis (Spring and Fall). (Patient not taking: Reported on 02/27/2022)     ibuprofen (ADVIL) 800 MG tablet Take 1 tablet (800 mg total) by mouth every 8 (eight) hours as needed. 30 tablet 0   levothyroxine (SYNTHROID) 150 MCG tablet Take 150 mcg by mouth daily before breakfast.     losartan (COZAAR) 100 MG tablet Take 100 mg by mouth daily.     nitroGLYCERIN (NITROSTAT) 0.4 MG SL tablet PLACE 1 TABLET UNDER THE TONGUE EVERY 5 MINUTES AS NEEDED FOR CHEST PAIN 25 tablet 11   Omega-3 Fatty Acids (FISH OIL) 1000 MG CAPS Take 2 capsules by mouth in the morning and at bedtime.     pantoprazole (PROTONIX) 40 MG tablet Take 40 mg by mouth daily.     pravastatin (PRAVACHOL) 80 MG tablet TAKE 1 TABLET(80 MG) BY MOUTH DAILY 90 tablet 1   zolpidem (AMBIEN) 10 MG tablet Take 10 mg by mouth at bedtime.  0   No current facility-administered medications for this encounter.    Physical Findings: The patient is in no acute distress. Patient is alert and oriented.  height is 5\' 11"  (1.803 m) and weight is 157  lb 8 oz (71.4 kg). His temporal temperature is 96.2 F (35.7 C) (abnormal). His blood pressure is 126/72 and his pulse is 67. His respiration is 18 and oxygen saturation is 99%. .   General: Alert and oriented, in no acute distress  HEENT: Head is normocephalic. Extraocular movements are intact. Oropharynx is clear. Neck: Neck is supple, no palpable cervical or supraclavicular lymphadenopathy. Heart: Regular in rate and rhythm with no murmurs, rubs, or gallops. Chest: Clear to auscultation bilaterally, with no rhonchi, wheezes, or rales. Abdomen: Soft, nontender, nondistended, with no rigidity or guarding. Extremities: No cyanosis or edema. Lymphatics: see Neck Exam Skin: No concerning lesions. Musculoskeletal: symmetric strength  and muscle tone throughout. No tenderness to palpation of spine.  Neurologic: Cranial nerves II through XII are grossly intact. No obvious focalities. Speech is fluent. Coordination is intact. Psychiatric: Judgment and insight are intact. Affect is appropriate.    Lab Findings: Lab Results  Component Value Date   WBC 5.7 02/05/2022   HGB 14.7 02/05/2022   HCT 44.6 02/05/2022   MCV 91.6 02/05/2022   PLT 130 (L) 02/05/2022    Radiographic Findings: No results found.  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 Unfortunately he is still experiencing a significant amount of pain. He delayed PT due to hernia surgery but will start that soon. He sees NSU soon, too. I mentioned that there is a pain specialist at the [facility] clinic that may be helpful. He will ask about this. He is scheduled for follow up imaging (MRI) of his spine in July.  I  will see him back after that.  I really appreciated taking part in this patient's care. He knows he can call back at anytime with questions or concerns before his next visit.  On date of service, in total, I spent 20 minutes on this encounter. Patient was seen in person.  _____________________________________   Leona Singleton, PA    Eppie Gibson, MD

## 2022-03-01 ENCOUNTER — Other Ambulatory Visit: Payer: Self-pay

## 2022-03-01 ENCOUNTER — Ambulatory Visit (HOSPITAL_COMMUNITY): Payer: Medicare Other

## 2022-03-01 ENCOUNTER — Ambulatory Visit: Payer: Medicare Other

## 2022-03-01 ENCOUNTER — Ambulatory Visit (HOSPITAL_COMMUNITY)
Admission: RE | Admit: 2022-03-01 | Discharge: 2022-03-01 | Disposition: A | Discharge status: Home / Self Care | Payer: Medicare Other | Source: Ambulatory Visit | Attending: Internal Medicine | Admitting: Internal Medicine

## 2022-03-01 ENCOUNTER — Inpatient Hospital Stay: Payer: Medicare Other | Attending: Internal Medicine

## 2022-03-01 DIAGNOSIS — Z85850 Personal history of malignant neoplasm of thyroid: Secondary | ICD-10-CM | POA: Insufficient documentation

## 2022-03-01 DIAGNOSIS — Z800 Family history of malignant neoplasm of digestive organs: Secondary | ICD-10-CM | POA: Insufficient documentation

## 2022-03-01 DIAGNOSIS — M545 Low back pain, unspecified: Secondary | ICD-10-CM | POA: Insufficient documentation

## 2022-03-01 DIAGNOSIS — C3490 Malignant neoplasm of unspecified part of unspecified bronchus or lung: Secondary | ICD-10-CM

## 2022-03-01 DIAGNOSIS — Z85118 Personal history of other malignant neoplasm of bronchus and lung: Secondary | ICD-10-CM | POA: Insufficient documentation

## 2022-03-01 DIAGNOSIS — I10 Essential (primary) hypertension: Secondary | ICD-10-CM | POA: Insufficient documentation

## 2022-03-01 DIAGNOSIS — J9811 Atelectasis: Secondary | ICD-10-CM | POA: Diagnosis not present

## 2022-03-01 LAB — CBC WITH DIFFERENTIAL (CANCER CENTER ONLY)
Abs Immature Granulocytes: 0.01 10*3/uL (ref 0.00–0.07)
Basophils Absolute: 0 10*3/uL (ref 0.0–0.1)
Basophils Relative: 1 %
Eosinophils Absolute: 0.2 10*3/uL (ref 0.0–0.5)
Eosinophils Relative: 5 %
HCT: 41.8 % (ref 39.0–52.0)
Hemoglobin: 14.6 g/dL (ref 13.0–17.0)
Immature Granulocytes: 0 %
Lymphocytes Relative: 26 %
Lymphs Abs: 1.3 10*3/uL (ref 0.7–4.0)
MCH: 31.3 pg (ref 26.0–34.0)
MCHC: 34.9 g/dL (ref 30.0–36.0)
MCV: 89.7 fL (ref 80.0–100.0)
Monocytes Absolute: 0.6 10*3/uL (ref 0.1–1.0)
Monocytes Relative: 13 %
Neutro Abs: 2.7 10*3/uL (ref 1.7–7.7)
Neutrophils Relative %: 55 %
Platelet Count: 180 10*3/uL (ref 150–400)
RBC: 4.66 MIL/uL (ref 4.22–5.81)
RDW: 13.2 % (ref 11.5–15.5)
WBC Count: 4.9 10*3/uL (ref 4.0–10.5)
nRBC: 0 % (ref 0.0–0.2)

## 2022-03-01 LAB — CMP (CANCER CENTER ONLY)
ALT: 12 U/L (ref 0–44)
AST: 19 U/L (ref 15–41)
Albumin: 4.4 g/dL (ref 3.5–5.0)
Alkaline Phosphatase: 72 U/L (ref 38–126)
Anion gap: 6 (ref 5–15)
BUN: 13 mg/dL (ref 8–23)
CO2: 32 mmol/L (ref 22–32)
Calcium: 8.8 mg/dL — ABNORMAL LOW (ref 8.9–10.3)
Chloride: 104 mmol/L (ref 98–111)
Creatinine: 0.74 mg/dL (ref 0.61–1.24)
GFR, Estimated: >60 mL/min (ref >60)
Glucose, Bld: 106 mg/dL — ABNORMAL HIGH (ref 70–99)
Potassium: 3.8 mmol/L (ref 3.5–5.1)
Sodium: 142 mmol/L (ref 135–145)
Total Bilirubin: 0.8 mg/dL (ref 0.3–1.2)
Total Protein: 6.5 g/dL (ref 6.5–8.1)

## 2022-03-01 MED ORDER — IOHEXOL 300 MG/ML  SOLN
75.0000 mL | Freq: Once | INTRAMUSCULAR | Status: AC | PRN
  Administered 2022-03-01: 75 mL via INTRAVENOUS

## 2022-03-05 ENCOUNTER — Inpatient Hospital Stay (HOSPITAL_BASED_OUTPATIENT_CLINIC_OR_DEPARTMENT_OTHER): Payer: Medicare Other | Admitting: Internal Medicine

## 2022-03-05 ENCOUNTER — Other Ambulatory Visit: Payer: Self-pay

## 2022-03-05 VITALS — BP 121/77 | HR 71 | Resp 18 | Wt 157.5 lb

## 2022-03-05 DIAGNOSIS — Z85850 Personal history of malignant neoplasm of thyroid: Secondary | ICD-10-CM | POA: Diagnosis not present

## 2022-03-05 DIAGNOSIS — C3490 Malignant neoplasm of unspecified part of unspecified bronchus or lung: Secondary | ICD-10-CM

## 2022-03-05 DIAGNOSIS — I10 Essential (primary) hypertension: Secondary | ICD-10-CM | POA: Diagnosis not present

## 2022-03-05 DIAGNOSIS — Z800 Family history of malignant neoplasm of digestive organs: Secondary | ICD-10-CM | POA: Diagnosis not present

## 2022-03-05 DIAGNOSIS — M545 Low back pain, unspecified: Secondary | ICD-10-CM | POA: Diagnosis not present

## 2022-03-05 DIAGNOSIS — Z85118 Personal history of other malignant neoplasm of bronchus and lung: Secondary | ICD-10-CM | POA: Diagnosis not present

## 2022-03-05 NOTE — Progress Notes (Unsigned)
[address]       [place] [address]             [phone]   PCP is [name], MD Referring Provider is [name], MD  Chief Complaint:   HPI: This is a 77 year old with a past medical history of AI, CAD, GERD, hyperlipidemia, hypertension, pre diabetes, thyroid cancer, non small carcinoma (adenocarcinoma) who underwent a left  video-assisted thoracoscopy, left upper lobectomy with lymph node dissection, and intercostal nerve block by Dr. Servando Snare on 06/24/2018. He presents today for further lung surveillance. Past Medical History:  Diagnosis Date   Anxiety    with MRIs (or closed spaces)   Aortic insufficiency    Arthritis    CAD (coronary artery disease)    Colon polyps    Family history of adverse reaction to anesthesia    Mom vomits and Dad went "nuts"   Family history of colon cancer    Family history of stomach cancer    Fatigue    GERD (gastroesophageal reflux disease)    Hemorrhoids    History of echocardiogram    Echo 02/08/16: Mild LVH, EF 55-60, no RWMA, Gr 1 DD, trivial AI, mild LAE   Hyperlipidemia    Hypertension    Lung cancer ([place])    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   PONV (postoperative nausea and vomiting)    Pre-diabetes    Pulmonary nodules    Thyroid cancer ([place])    thyroid removed    Past Surgical History:  Procedure Laterality Date   BACK SURGERY     CAROTID ENDARTERECTOMY     COLONOSCOPY WITH ESOPHAGOGASTRODUODENOSCOPY (EGD)  2008   CORONARY STENT INTERVENTION N/A 02/20/2016   Procedure: Coronary Stent Intervention;  Surgeon: Burnell Blanks, MD;  Location: [facility];  Service: Cardiovascular;  Laterality: N/A;   ESOPHAGOGASTRODUODENOSCOPY     INGUINAL HERNIA REPAIR Right 02/07/2022   Procedure: OPEN RIGHT INGUINAL HERNIA REPAIR WITH MESH;  Surgeon: Kinsinger, Arta Bruce, MD;  Location: [facility];  Service: General;  Laterality: Right;   LEFT HEART CATH AND CORONARY ANGIOGRAPHY N/A 02/20/2016   Procedure: Left  Heart Cath and Coronary Angiography;  Surgeon: Burnell Blanks, MD;  Location: [facility];  Service: Cardiovascular;  Laterality: N/A;   LUNG REMOVAL, PARTIAL Left 06/24/2018   Pt states "1/2 of lung was removed"   PILONIDAL CYST EXCISION     REFRACTIVE SURGERY Right    retina tear repair   THYROIDECTOMY N/A 01/17/2018   Procedure: TOTAL THYROIDECTOMY;  Surgeon: Armandina Gemma, MD;  Location: [facility];  Service: General;  Laterality: N/A;   VIDEO ASSISTED THORACOSCOPY (VATS)/WEDGE RESECTION Left 06/24/2018   Procedure: VIDEO ASSISTED THORACOSCOPY (VATS)/LUNG RESECTION;  Surgeon: Grace Isaac, MD;  Location: [facility];  Service: Thoracic;  Laterality: Left;   VIDEO BRONCHOSCOPY WITH ENDOBRONCHIAL NAVIGATION N/A 11/18/2017   Procedure: VIDEO BRONCHOSCOPY WITH ENDOBRONCHIAL NAVIGATION WITH BIOPSIES OF LEFT AND RIGHT UPPER LOBES;  Surgeon: Grace Isaac, MD;  Location: [facility];  Service: Thoracic;  Laterality: N/A;   VIDEO BRONCHOSCOPY WITH ENDOBRONCHIAL NAVIGATION N/A 06/11/2018   Procedure: VIDEO BRONCHOSCOPY WITH ENDOBRONCHIAL NAVIGATION;  Surgeon: Grace Isaac, MD;  Location: [facility];  Service: Thoracic;  Laterality: N/A;  Open right inguinal hernia repair with mesh on 02/07/2022  Family History  Problem Relation Age of Onset   Heart attack Father 14       BYPASS   Crohn's disease Sister  Parkinsonism Sister    Healthy Sister    Stomach cancer Paternal Uncle        diagnosed mid-30s   Throat cancer Paternal Uncle        hx of smoking   Cancer Paternal Uncle        unknown type   Cancer Paternal Uncle        unknown type   Thyroid cancer Daughter        papillary   Colon cancer Cousin 23       paternal cousin    Social History Social History   Tobacco Use   Smoking status: Former    Packs/day: 1.00    Years: 3.00    Total pack years: 3.00    Types: Cigarettes    Quit date: 02/01/1967    Years since quitting: 55.1   Smokeless tobacco: Never  Vaping  Use   Vaping Use: Never used  Substance Use Topics   Alcohol use: Yes    Comment: a drink before dinner a few times a week (wine or a mixed drink)   Drug use: No    Current Outpatient Medications  Medication Sig Dispense Refill   acetaminophen (TYLENOL) 325 MG tablet Take 650 mg by mouth every 6 (six) hours as needed (Back pain).     amLODipine (NORVASC) 10 MG tablet Take 10 mg by mouth daily.     aspirin EC 81 MG tablet Take 81 mg by mouth daily.     carvedilol (COREG) 6.25 MG tablet TAKE 1 TABLET(6.25 MG) BY MOUTH TWICE DAILY WITH A MEAL 180 tablet 3   Coenzyme Q10 (COQ10) 100 MG CAPS Take 100 mg by mouth daily.     ezetimibe (ZETIA) 10 MG tablet TAKE 1 TABLET(10 MG) BY MOUTH DAILY 90 tablet 3   famotidine (PEPCID) 20 MG tablet Take 20 mg by mouth daily.     fluticasone (FLONASE) 50 MCG/ACT nasal spray Place 1 spray into both nostrils daily as needed for allergies or rhinitis (Spring and Fall). (Patient not taking: Reported on 02/27/2022)     ibuprofen (ADVIL) 800 MG tablet Take 1 tablet (800 mg total) by mouth every 8 (eight) hours as needed. 30 tablet 0   levothyroxine (SYNTHROID) 150 MCG tablet Take 150 mcg by mouth daily before breakfast.     losartan (COZAAR) 100 MG tablet Take 100 mg by mouth daily.     nitroGLYCERIN (NITROSTAT) 0.4 MG SL tablet PLACE 1 TABLET UNDER THE TONGUE EVERY 5 MINUTES AS NEEDED FOR CHEST PAIN 25 tablet 11   Omega-3 Fatty Acids (FISH OIL) 1000 MG CAPS Take 2 capsules by mouth in the morning and at bedtime.     pantoprazole (PROTONIX) 40 MG tablet Take 40 mg by mouth daily.     pravastatin (PRAVACHOL) 80 MG tablet TAKE 1 TABLET(80 MG) BY MOUTH DAILY 90 tablet 1   zolpidem (AMBIEN) 10 MG tablet Take 10 mg by mouth at bedtime.  0   Allergies: Allergies  Allergen Reactions   Gemfibrozil Other (See Comments)    Muscle pain   Propranolol     Other reaction(s): SOB  Only had issue with Slow release propranolol   Niaspan [Niacin Er] Rash and Other (See  Comments)    Flushing - aspirin did not mitigate    Simvastatin Other (See Comments)    Drug-Drug interaction with amlodipine   Vital Signs:  Physical Exam CV Pulmonary Abdomen Extremities   Diagnostic Tests:   Impression and Plan:  Nani Skillern, PA-C [facility] [phone]

## 2022-03-05 NOTE — Progress Notes (Signed)
[facility] [phone]   [phone]  OFFICE PROGRESS NOTE  [name], MD [place] [address]  DIAGNOSIS: Stage IA (T1c, N0, M0) non-small cell lung cancer, adenocarcinoma with no actionable mutation and negative PDL 1 expression diagnosed in June 2020.   PRIOR THERAPY: Status post left upper lobectomy with lymph node dissection on June 24, 2018.  CURRENT THERAPY: Observation.  INTERVAL HISTORY: Miguel Wolfe 77 y.o. male returns to the clinic today for follow-up visit accompanied by his wife.  The patient is feeling fine today with no concerning complaints except for the persistent low back pain.  He was seen by interventional radiology.  He denied having any current chest pain, shortness of breath, cough or hemoptysis.  He has no nausea, vomiting, diarrhea or constipation.  He has no headache or visual changes.  He is here today for evaluation with repeat CT scan of the chest for restaging of his disease.    MEDICAL HISTORY: Past Medical History:  Diagnosis Date   Anxiety    with MRIs (or closed spaces)   Aortic insufficiency    Arthritis    CAD (coronary artery disease)    Colon polyps    Family history of adverse reaction to anesthesia    Mom vomits and Dad went "nuts"   Family history of colon cancer    Family history of stomach cancer    Fatigue    GERD (gastroesophageal reflux disease)    Hemorrhoids    History of echocardiogram    Echo 02/08/16: Mild LVH, EF 55-60, no RWMA, Gr 1 DD, trivial AI, mild LAE   Hyperlipidemia    Hypertension    Lung cancer ([place])    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   PONV (postoperative nausea and vomiting)    Pre-diabetes    Pulmonary nodules    Thyroid cancer ([place])    thyroid removed    ALLERGIES:  is allergic to gemfibrozil, propranolol, niaspan [niacin er], and simvastatin.  MEDICATIONS:  Current Outpatient Medications  Medication Sig Dispense Refill   acetaminophen  (TYLENOL) 325 MG tablet Take 650 mg by mouth every 6 (six) hours as needed (Back pain).     amLODipine (NORVASC) 10 MG tablet Take 10 mg by mouth daily.     aspirin EC 81 MG tablet Take 81 mg by mouth daily.     carvedilol (COREG) 6.25 MG tablet TAKE 1 TABLET(6.25 MG) BY MOUTH TWICE DAILY WITH A MEAL 180 tablet 3   Coenzyme Q10 (COQ10) 100 MG CAPS Take 100 mg by mouth daily.     ezetimibe (ZETIA) 10 MG tablet TAKE 1 TABLET(10 MG) BY MOUTH DAILY 90 tablet 3   famotidine (PEPCID) 20 MG tablet Take 20 mg by mouth daily.     fluticasone (FLONASE) 50 MCG/ACT nasal spray Place 1 spray into both nostrils daily as needed for allergies or rhinitis (Spring and Fall). (Patient not taking: Reported on 02/27/2022)     ibuprofen (ADVIL) 800 MG tablet Take 1 tablet (800 mg total) by mouth every 8 (eight) hours as needed. 30 tablet 0   levothyroxine (SYNTHROID) 150 MCG tablet Take 150 mcg by mouth daily before breakfast.     losartan (COZAAR) 100 MG tablet Take 100 mg by mouth daily.     nitroGLYCERIN (NITROSTAT) 0.4 MG SL tablet PLACE 1 TABLET UNDER THE TONGUE EVERY 5 MINUTES AS NEEDED FOR CHEST PAIN 25 tablet 11   Omega-3 Fatty Acids (  FISH OIL) 1000 MG CAPS Take 2 capsules by mouth in the morning and at bedtime.     pantoprazole (PROTONIX) 40 MG tablet Take 40 mg by mouth daily.     pravastatin (PRAVACHOL) 80 MG tablet TAKE 1 TABLET(80 MG) BY MOUTH DAILY 90 tablet 1   zolpidem (AMBIEN) 10 MG tablet Take 10 mg by mouth at bedtime.  0   No current facility-administered medications for this visit.    SURGICAL HISTORY:  Past Surgical History:  Procedure Laterality Date   BACK SURGERY     CAROTID ENDARTERECTOMY     COLONOSCOPY WITH ESOPHAGOGASTRODUODENOSCOPY (EGD)  2008   CORONARY STENT INTERVENTION N/A 02/20/2016   Procedure: Coronary Stent Intervention;  Surgeon: Burnell Blanks, MD;  Location: [facility];  Service: Cardiovascular;  Laterality: N/A;   ESOPHAGOGASTRODUODENOSCOPY     INGUINAL  HERNIA REPAIR Right 02/07/2022   Procedure: OPEN RIGHT INGUINAL HERNIA REPAIR WITH MESH;  Surgeon: Kinsinger, Arta Bruce, MD;  [facility];  Service: General;  Laterality: Right;   LEFT HEART CATH AND CORONARY ANGIOGRAPHY N/A 02/20/2016   Procedure: Left Heart Cath and Coronary Angiography;  Surgeon: Burnell Blanks, MD;  Location: [facility];  Service: Cardiovascular;  Laterality: N/A;   LUNG REMOVAL, PARTIAL Left 06/24/2018   Pt states "1/2 of lung was removed"   PILONIDAL CYST EXCISION     REFRACTIVE SURGERY Right    retina tear repair   THYROIDECTOMY N/A 01/17/2018   Procedure: TOTAL THYROIDECTOMY;  Surgeon: Armandina Gemma, MD;  [facility];  Service: General;  Laterality: N/A;   VIDEO ASSISTED THORACOSCOPY (VATS)/WEDGE RESECTION Left 06/24/2018   Procedure: VIDEO ASSISTED THORACOSCOPY (VATS)/LUNG RESECTION;  Surgeon: Grace Isaac, MD;  Location: [facility];  Service: Thoracic;  Laterality: Left;   VIDEO BRONCHOSCOPY WITH ENDOBRONCHIAL NAVIGATION N/A 11/18/2017   Procedure: VIDEO BRONCHOSCOPY WITH ENDOBRONCHIAL NAVIGATION WITH BIOPSIES OF LEFT AND RIGHT UPPER LOBES;  Surgeon: Grace Isaac, MD;  Location: [facility];  Service: Thoracic;  Laterality: N/A;   VIDEO BRONCHOSCOPY WITH ENDOBRONCHIAL NAVIGATION N/A 06/11/2018   Procedure: VIDEO BRONCHOSCOPY WITH ENDOBRONCHIAL NAVIGATION;  Surgeon: Grace Isaac, MD;  Location: [facility];  Service: Thoracic;  Laterality: N/A;    REVIEW OF SYSTEMS:  A comprehensive review of systems was negative except for: Musculoskeletal: positive for back pain   PHYSICAL EXAMINATION: General appearance: alert, cooperative, and no distress Head: Normocephalic, without obvious abnormality, atraumatic Neck: no adenopathy, no JVD, supple, symmetrical, trachea midline, and thyroid not enlarged, symmetric, no tenderness/mass/nodules Lymph nodes: Cervical, supraclavicular, and axillary nodes normal. Resp: clear to auscultation  bilaterally Back: symmetric, no curvature. ROM normal. No CVA tenderness. Cardio: regular rate and rhythm, S1, S2 normal, no murmur, click, rub or gallop GI: soft, non-tender; bowel sounds normal; no masses,  no organomegaly Extremities: extremities normal, atraumatic, no cyanosis or edema  ECOG PERFORMANCE STATUS: 1 - Symptomatic but completely ambulatory  Blood pressure 121/77, pulse 71, resp. rate 18, weight 157 lb 8 oz (71.4 kg), SpO2 99 %.  LABORATORY DATA: Lab Results  Component Value Date   WBC 4.9 03/01/2022   HGB 14.6 03/01/2022   HCT 41.8 03/01/2022   MCV 89.7 03/01/2022   PLT 180 03/01/2022      Chemistry      Component Value Date/Time   NA 142 03/01/2022 0842   NA 143 04/24/2016 0935   K 3.8 03/01/2022 0842   CL 104 03/01/2022 0842   CO2 32 03/01/2022 0842  BUN 13 03/01/2022 0842   BUN 17 04/24/2016 0935   CREATININE 0.74 03/01/2022 0842      Component Value Date/Time   CALCIUM 8.8 (L) 03/01/2022 0842   ALKPHOS 72 03/01/2022 0842   AST 19 03/01/2022 0842   ALT 12 03/01/2022 0842   BILITOT 0.8 03/01/2022 0842       RADIOGRAPHIC STUDIES: CT Chest W Contrast  Result Date: 03/01/2022 CLINICAL DATA:  Staging non-small-cell lung cancer * Tracking Code: BO * EXAM: CT CHEST WITH CONTRAST TECHNIQUE: Multidetector CT imaging of the chest was performed during intravenous contrast administration. RADIATION DOSE REDUCTION: This exam was performed according to the departmental dose-optimization program which includes automated exposure control, adjustment of the mA and/or kV according to patient size and/or use of iterative reconstruction technique. CONTRAST:  26m OMNIPAQUE IOHEXOL 300 MG/ML  SOLN COMPARISON:  CT 03/01/2021 and older FINDINGS: Cardiovascular: No pericardial effusion. The heart is nonenlarged. Diffuse coronary artery calcifications are identified. Minimal ectasia of the ascending aorta with diameter at the level of the right pulmonary artery measuring 3.8  cm in diameter. Appearance is similar to previous. No dissection. Mild vascular calcifications along the aorta. Mediastinum/Nodes: Surgical changes in the area of the thyroid bed. No specific abnormal lymph node enlargement seen in the axillary region, hilum or mediastinum. Surgical changes from left-sided lobectomy, upper lobe. Clips in the left hilum. Slightly patulous esophagus. Lungs/Pleura: The left lung again has volume loss from lobectomy. Areas of scarring and atelectatic change. No consolidation, pneumothorax or pleural effusion. The right lung also without consolidation, pneumothorax or effusion. Previous in the in the right upper lobe focal area of bandlike beginning with ground-glass. This area is stable today as seen on image 41 of series 5. Upper Abdomen: Along the upper abdomen the adrenal glands are preserved. Fatty liver infiltration suspected. Musculoskeletal: Scattered degenerative changes of the spine. Fixation hardware seen along the lower spine with pedicle screws and vertical fixation rods. Once again the L1 level as partially sclerotic, compressed with displacement of the posterior wall of the body. The compression and the surgical changes are new. Please correlate with lytic lesion. Stable soft tissue density to the right of the L1 vertebral body with the adjacent bony scalloping of the cortex of the bone, unchanged from previous. IMPRESSION: Interval surgical changes with fixation from T11 through L2 with deformity and mixed density of the L1 vertebral body. Please correlate with the history the Stable surgical changes of left upper lobe lobectomy. No developing new mass lesion, fluid collection or lymph node enlargement in the thorax. Stable area of ground-glass and bandlike opacity in the right upper lobe. Recommend continued follow-up surveillance. Stable paraspinal soft tissue nodule to the right of the L1 vertebral body with bony remodeling of the cortex of L1. Aortic Atherosclerosis  (ICD10-I70.0). Electronically Signed   By: AJill SideM.D.   On: 03/01/2022 17:01     ASSESSMENT AND PLAN: This is a very pleasant 77years old white male with stage Ia non-small cell lung cancer, adenocarcinoma status post left upper lobectomy with lymph node dissection under the care of Dr. GServando Snarein June 2020. The patient is currently on observation and he is feeling fine with no concerning complaints except for the persistent low back pain. He had repeat CT scan of the chest performed recently.  I personally and independently reviewed the scan and discussed the result with the patient and his wife. His scan showed no concerning findings for disease recurrence or metastasis. I recommended  for the patient to continue on observation with repeat CT scan of the chest in 1 year. He was advised to call immediately if he has any other concerning symptoms in the interval. The patient voices understanding of current disease status and treatment options and is in agreement with the current care plan.  All questions were answered. The patient knows to call the clinic with any problems, questions or concerns. We can certainly see the patient much sooner if necessary.  The total time spent in the appointment was 20 minutes.  Disclaimer: This note was dictated with voice recognition software. Similar sounding words can inadvertently be transcribed and may not be corrected upon review.

## 2022-03-06 ENCOUNTER — Ambulatory Visit: Payer: Medicare Other | Admitting: Radiation Oncology

## 2022-03-06 DIAGNOSIS — M8468XD Pathological fracture in other disease, other site, subsequent encounter for fracture with routine healing: Secondary | ICD-10-CM | POA: Diagnosis not present

## 2022-03-06 DIAGNOSIS — M5459 Other low back pain: Secondary | ICD-10-CM | POA: Diagnosis not present

## 2022-03-07 ENCOUNTER — Ambulatory Visit (INDEPENDENT_AMBULATORY_CARE_PROVIDER_SITE_OTHER): Payer: Medicare Other | Admitting: Physician Assistant

## 2022-03-07 VITALS — BP 127/70 | HR 69 | Resp 18 | Ht 71.0 in | Wt 159.0 lb

## 2022-03-07 DIAGNOSIS — I2510 Atherosclerotic heart disease of native coronary artery without angina pectoris: Secondary | ICD-10-CM | POA: Diagnosis not present

## 2022-03-07 DIAGNOSIS — Z85118 Personal history of other malignant neoplasm of bronchus and lung: Secondary | ICD-10-CM

## 2022-03-07 DIAGNOSIS — C3412 Malignant neoplasm of upper lobe, left bronchus or lung: Secondary | ICD-10-CM | POA: Diagnosis not present

## 2022-03-08 ENCOUNTER — Encounter: Payer: Self-pay | Admitting: Physician Assistant

## 2022-03-16 DIAGNOSIS — D3132 Benign neoplasm of left choroid: Secondary | ICD-10-CM | POA: Diagnosis not present

## 2022-03-16 DIAGNOSIS — H43812 Vitreous degeneration, left eye: Secondary | ICD-10-CM | POA: Diagnosis not present

## 2022-03-16 DIAGNOSIS — H4312 Vitreous hemorrhage, left eye: Secondary | ICD-10-CM | POA: Diagnosis not present

## 2022-03-16 DIAGNOSIS — H35371 Puckering of macula, right eye: Secondary | ICD-10-CM | POA: Diagnosis not present

## 2022-03-21 ENCOUNTER — Ambulatory Visit
Admission: RE | Admit: 2022-03-21 | Discharge: 2022-03-21 | Disposition: A | Discharge status: Home / Self Care | Payer: Medicare Other | Source: Ambulatory Visit | Attending: Neurology | Admitting: Neurology

## 2022-03-21 DIAGNOSIS — H35371 Puckering of macula, right eye: Secondary | ICD-10-CM | POA: Diagnosis not present

## 2022-03-21 DIAGNOSIS — H31091 Other chorioretinal scars, right eye: Secondary | ICD-10-CM | POA: Diagnosis not present

## 2022-03-21 DIAGNOSIS — H43812 Vitreous degeneration, left eye: Secondary | ICD-10-CM | POA: Diagnosis not present

## 2022-03-21 DIAGNOSIS — H35033 Hypertensive retinopathy, bilateral: Secondary | ICD-10-CM | POA: Diagnosis not present

## 2022-03-21 DIAGNOSIS — H35363 Drusen (degenerative) of macula, bilateral: Secondary | ICD-10-CM | POA: Diagnosis not present

## 2022-03-21 DIAGNOSIS — R9089 Other abnormal findings on diagnostic imaging of central nervous system: Secondary | ICD-10-CM

## 2022-03-21 DIAGNOSIS — H4312 Vitreous hemorrhage, left eye: Secondary | ICD-10-CM | POA: Diagnosis not present

## 2022-03-21 MED ORDER — GADOPICLENOL 0.5 MMOL/ML IV SOLN
7.5000 mL | Freq: Once | INTRAVENOUS | Status: AC | PRN
  Administered 2022-03-21: 7.5 mL via INTRAVENOUS

## 2022-03-23 ENCOUNTER — Telehealth: Payer: Self-pay

## 2022-03-23 NOTE — Telephone Encounter (Signed)
Patient is calling in wondering about the MRI results, if we can call with the results.

## 2022-03-23 NOTE — Telephone Encounter (Signed)
Called patient and informed him that results are not ready yet and we will contact him as soon as they become available. Patient verbalized understanding.

## 2022-03-26 ENCOUNTER — Other Ambulatory Visit: Payer: Self-pay | Admitting: Neurosurgery

## 2022-03-26 ENCOUNTER — Telehealth: Payer: Self-pay | Admitting: Neurology

## 2022-03-26 DIAGNOSIS — D1809 Hemangioma of other sites: Secondary | ICD-10-CM

## 2022-03-26 NOTE — Progress Notes (Signed)
LMVOM for patient to call the office back.

## 2022-03-26 NOTE — Progress Notes (Signed)
Patient advised of his MRI results.

## 2022-03-26 NOTE — Telephone Encounter (Signed)
Pt called in and left a message with the access nurse. He would like to find out about his MRI results

## 2022-03-27 DIAGNOSIS — M8468XD Pathological fracture in other disease, other site, subsequent encounter for fracture with routine healing: Secondary | ICD-10-CM | POA: Diagnosis not present

## 2022-03-27 DIAGNOSIS — M5459 Other low back pain: Secondary | ICD-10-CM | POA: Diagnosis not present

## 2022-03-27 NOTE — Telephone Encounter (Signed)
See result note. Patient advised of MRI results.

## 2022-03-29 DIAGNOSIS — M5459 Other low back pain: Secondary | ICD-10-CM | POA: Diagnosis not present

## 2022-03-29 DIAGNOSIS — M8468XD Pathological fracture in other disease, other site, subsequent encounter for fracture with routine healing: Secondary | ICD-10-CM | POA: Diagnosis not present

## 2022-03-30 DIAGNOSIS — I10 Essential (primary) hypertension: Secondary | ICD-10-CM | POA: Diagnosis not present

## 2022-03-30 DIAGNOSIS — Z85118 Personal history of other malignant neoplasm of bronchus and lung: Secondary | ICD-10-CM | POA: Diagnosis not present

## 2022-03-30 DIAGNOSIS — E782 Mixed hyperlipidemia: Secondary | ICD-10-CM | POA: Diagnosis not present

## 2022-03-30 DIAGNOSIS — I25111 Atherosclerotic heart disease of native coronary artery with angina pectoris with documented spasm: Secondary | ICD-10-CM | POA: Diagnosis not present

## 2022-03-30 DIAGNOSIS — Z7189 Other specified counseling: Secondary | ICD-10-CM | POA: Diagnosis not present

## 2022-04-01 DIAGNOSIS — Z23 Encounter for immunization: Secondary | ICD-10-CM | POA: Diagnosis not present

## 2022-04-03 DIAGNOSIS — M8468XD Pathological fracture in other disease, other site, subsequent encounter for fracture with routine healing: Secondary | ICD-10-CM | POA: Diagnosis not present

## 2022-04-03 DIAGNOSIS — M5459 Other low back pain: Secondary | ICD-10-CM | POA: Diagnosis not present

## 2022-04-05 DIAGNOSIS — M47816 Spondylosis without myelopathy or radiculopathy, lumbar region: Secondary | ICD-10-CM | POA: Diagnosis not present

## 2022-04-05 DIAGNOSIS — M5459 Other low back pain: Secondary | ICD-10-CM | POA: Diagnosis not present

## 2022-04-05 DIAGNOSIS — G8929 Other chronic pain: Secondary | ICD-10-CM | POA: Diagnosis not present

## 2022-04-05 DIAGNOSIS — M8468XD Pathological fracture in other disease, other site, subsequent encounter for fracture with routine healing: Secondary | ICD-10-CM | POA: Diagnosis not present

## 2022-04-11 DIAGNOSIS — M5459 Other low back pain: Secondary | ICD-10-CM | POA: Diagnosis not present

## 2022-04-11 DIAGNOSIS — M8468XD Pathological fracture in other disease, other site, subsequent encounter for fracture with routine healing: Secondary | ICD-10-CM | POA: Diagnosis not present

## 2022-04-13 DIAGNOSIS — M5459 Other low back pain: Secondary | ICD-10-CM | POA: Diagnosis not present

## 2022-04-13 DIAGNOSIS — M8468XD Pathological fracture in other disease, other site, subsequent encounter for fracture with routine healing: Secondary | ICD-10-CM | POA: Diagnosis not present

## 2022-04-16 ENCOUNTER — Other Ambulatory Visit: Payer: Self-pay | Admitting: Cardiology

## 2022-04-17 DIAGNOSIS — M5459 Other low back pain: Secondary | ICD-10-CM | POA: Diagnosis not present

## 2022-04-17 DIAGNOSIS — M8468XD Pathological fracture in other disease, other site, subsequent encounter for fracture with routine healing: Secondary | ICD-10-CM | POA: Diagnosis not present

## 2022-04-18 ENCOUNTER — Ambulatory Visit
Admission: RE | Admit: 2022-04-18 | Discharge: 2022-04-18 | Disposition: A | Discharge status: Home / Self Care | Payer: Medicare Other | Source: Ambulatory Visit | Attending: Neurosurgery | Admitting: Neurosurgery

## 2022-04-18 DIAGNOSIS — D1809 Hemangioma of other sites: Secondary | ICD-10-CM

## 2022-04-18 DIAGNOSIS — M8588 Other specified disorders of bone density and structure, other site: Secondary | ICD-10-CM | POA: Diagnosis not present

## 2022-04-18 DIAGNOSIS — M545 Low back pain, unspecified: Secondary | ICD-10-CM | POA: Diagnosis not present

## 2022-04-19 DIAGNOSIS — H4312 Vitreous hemorrhage, left eye: Secondary | ICD-10-CM | POA: Diagnosis not present

## 2022-04-19 DIAGNOSIS — H35363 Drusen (degenerative) of macula, bilateral: Secondary | ICD-10-CM | POA: Diagnosis not present

## 2022-04-19 DIAGNOSIS — H35371 Puckering of macula, right eye: Secondary | ICD-10-CM | POA: Diagnosis not present

## 2022-04-19 DIAGNOSIS — H43812 Vitreous degeneration, left eye: Secondary | ICD-10-CM | POA: Diagnosis not present

## 2022-04-19 DIAGNOSIS — H35033 Hypertensive retinopathy, bilateral: Secondary | ICD-10-CM | POA: Diagnosis not present

## 2022-04-19 DIAGNOSIS — H31091 Other chorioretinal scars, right eye: Secondary | ICD-10-CM | POA: Diagnosis not present

## 2022-04-20 ENCOUNTER — Other Ambulatory Visit: Payer: Self-pay | Admitting: General Surgery

## 2022-04-20 DIAGNOSIS — I861 Scrotal varices: Secondary | ICD-10-CM

## 2022-04-23 DIAGNOSIS — T84498D Other mechanical complication of other internal orthopedic devices, implants and grafts, subsequent encounter: Secondary | ICD-10-CM | POA: Diagnosis not present

## 2022-04-23 DIAGNOSIS — M8468XD Pathological fracture in other disease, other site, subsequent encounter for fracture with routine healing: Secondary | ICD-10-CM | POA: Diagnosis not present

## 2022-04-24 ENCOUNTER — Other Ambulatory Visit: Payer: Self-pay | Admitting: Neurosurgery

## 2022-05-02 NOTE — Pre-Procedure Instructions (Signed)
Surgical Instructions    Your procedure is scheduled on May 10, 2022.  Report to [facility] Main Entrance "A" at 5:30 A.M., then check in with the Admitting office.  Call this number if you have problems the morning of surgery:  [phone]  If you have any questions prior to your surgery date call [phone]: Open Monday-Friday 8am-4pm If you experience any cold or flu symptoms such as cough, fever, chills, shortness of breath, etc. between now and your scheduled surgery, please notify us at the above number.     Remember:  Do not eat after midnight the night before your surgery  You may drink clear liquids until 4:30 AM the morning of your surgery.   Clear liquids allowed are: Water, Non-Citrus Juices (without pulp), Carbonated Beverages, Clear Tea, Black Coffee Only (NO MILK, CREAM OR POWDERED CREAMER of any kind), and Gatorade.     Take these medicines the morning of surgery with A SIP OF WATER:  amLODipine (NORVASC)   carvedilol (COREG)   ezetimibe (ZETIA)   famotidine (PEPCID)   levothyroxine (SYNTHROID)   pravastatin (PRAVACHOL)    May take these medicines IF NEEDED:  acetaminophen (TYLENOL)   fluticasone (FLONASE) nasal spray   nitroGLYCERIN (NITROSTAT)    Follow your surgeon's instructions on when to stop Aspirin.  If no instructions were given by your surgeon then you will need to call the office to get those instructions.     As of today, STOP taking any Aleve, Naproxen, Ibuprofen, Motrin, Advil, Goody's, BC's, all herbal medications, fish oil, and all vitamins.                     Do NOT Smoke (Tobacco/Vaping) for 24 hours prior to your procedure.  If you use a CPAP at night, you may bring your mask/headgear for your overnight stay.   Contacts, glasses, piercing's, hearing aid's, dentures or partials may not be worn into surgery, please bring cases for these belongings.    For patients admitted to the hospital, discharge time will be determined by your  treatment team.   Patients discharged the day of surgery will not be allowed to drive home, and someone needs to stay with them for 24 hours.  SURGICAL WAITING ROOM VISITATION Patients having surgery or a procedure may have no more than 2 support people in the waiting area - these visitors may rotate.   Children under the age of 72 must have an adult with them who is not the patient. If the patient needs to stay at the hospital during part of their recovery, the visitor guidelines for inpatient rooms apply. Pre-op nurse will coordinate an appropriate time for 1 support person to accompany patient in pre-op.  This support person may not rotate.   Please refer to the [facility] website for the visitor guidelines for Inpatients (after your surgery is over and you are in a regular room).    Special instructions:   [facility]- Preparing For Surgery  Before surgery, you can play an important role. Because skin is not sterile, your skin needs to be as free of germs as possible. You can reduce the number of germs on your skin by washing with CHG (chlorahexidine gluconate) Soap before surgery.  CHG is an antiseptic cleaner which kills germs and bonds with the skin to continue killing germs even after washing.    Oral Hygiene is also important to reduce your risk of infection.  Remember - BRUSH YOUR TEETH THE MORNING OF  SURGERY WITH YOUR REGULAR TOOTHPASTE  Please follow the instructions on the handout you received at your pre-admission appointment about preparing for your upcoming surgery using the CHG surgical soap. If you have any questions or concerns, please call one of the numbers listed on the first page of this paperwork.   Day of Surgery: Take a shower with CHG soap. Do not wear jewelry or makeup Do not wear lotions, powders, perfumes/colognes, or deodorant. Do not shave 48 hours prior to surgery.  Men may shave face and neck. Do not bring valuables to the hospital.  [facility] is not  responsible for any belongings or valuables. Do not wear nail polish, gel polish, artificial nails, or any other type of covering on natural nails (fingers and toes) If you have artificial nails or gel coating that need to be removed by a nail salon, please have this removed prior to surgery. Artificial nails or gel coating may interfere with anesthesia's ability to adequately monitor your vital signs. Wear Clean/Comfortable clothing the morning of surgery Remember to brush your teeth WITH YOUR REGULAR TOOTHPASTE.   Please read over the following fact sheets that you were given.    If you received a COVID test during your pre-op visit  it is requested that you wear a mask when out in public, stay away from anyone that may not be feeling well and notify your surgeon if you develop symptoms. If you have been in contact with anyone that has tested positive in the last 10 days please notify you surgeon.

## 2022-05-02 NOTE — Progress Notes (Signed)
Voicemail left with Dr. Bettey Mare OR scheduler to place surgical orders.

## 2022-05-03 ENCOUNTER — Encounter (HOSPITAL_COMMUNITY): Payer: Self-pay

## 2022-05-03 ENCOUNTER — Encounter (HOSPITAL_COMMUNITY)
Admission: RE | Admit: 2022-05-03 | Discharge: 2022-05-03 | Disposition: A | Discharge status: Home / Self Care | Payer: Medicare Other | Source: Ambulatory Visit | Attending: Neurosurgery | Admitting: Neurosurgery

## 2022-05-03 ENCOUNTER — Other Ambulatory Visit: Payer: Self-pay

## 2022-05-03 VITALS — BP 127/69 | HR 66 | Temp 98.0°F | Resp 18 | Ht 71.0 in | Wt 158.0 lb

## 2022-05-03 DIAGNOSIS — Z902 Acquired absence of lung [part of]: Secondary | ICD-10-CM | POA: Insufficient documentation

## 2022-05-03 DIAGNOSIS — Z01812 Encounter for preprocedural laboratory examination: Secondary | ICD-10-CM | POA: Insufficient documentation

## 2022-05-03 DIAGNOSIS — Z955 Presence of coronary angioplasty implant and graft: Secondary | ICD-10-CM | POA: Diagnosis not present

## 2022-05-03 DIAGNOSIS — I2510 Atherosclerotic heart disease of native coronary artery without angina pectoris: Secondary | ICD-10-CM | POA: Insufficient documentation

## 2022-05-03 DIAGNOSIS — E785 Hyperlipidemia, unspecified: Secondary | ICD-10-CM | POA: Insufficient documentation

## 2022-05-03 DIAGNOSIS — T84296A Other mechanical complication of internal fixation device of vertebrae, initial encounter: Secondary | ICD-10-CM | POA: Diagnosis not present

## 2022-05-03 DIAGNOSIS — Z7902 Long term (current) use of antithrombotics/antiplatelets: Secondary | ICD-10-CM | POA: Diagnosis not present

## 2022-05-03 DIAGNOSIS — I10 Essential (primary) hypertension: Secondary | ICD-10-CM | POA: Insufficient documentation

## 2022-05-03 DIAGNOSIS — E890 Postprocedural hypothyroidism: Secondary | ICD-10-CM | POA: Insufficient documentation

## 2022-05-03 DIAGNOSIS — Z85850 Personal history of malignant neoplasm of thyroid: Secondary | ICD-10-CM | POA: Insufficient documentation

## 2022-05-03 DIAGNOSIS — Y812 Prosthetic and other implants, materials and accessory general- and plastic-surgery devices associated with adverse incidents: Secondary | ICD-10-CM | POA: Insufficient documentation

## 2022-05-03 DIAGNOSIS — Z85118 Personal history of other malignant neoplasm of bronchus and lung: Secondary | ICD-10-CM | POA: Diagnosis not present

## 2022-05-03 DIAGNOSIS — I700 Atherosclerosis of aorta: Secondary | ICD-10-CM | POA: Insufficient documentation

## 2022-05-03 DIAGNOSIS — K219 Gastro-esophageal reflux disease without esophagitis: Secondary | ICD-10-CM | POA: Insufficient documentation

## 2022-05-03 DIAGNOSIS — Z7982 Long term (current) use of aspirin: Secondary | ICD-10-CM | POA: Insufficient documentation

## 2022-05-03 DIAGNOSIS — I6782 Cerebral ischemia: Secondary | ICD-10-CM | POA: Insufficient documentation

## 2022-05-03 DIAGNOSIS — Z87891 Personal history of nicotine dependence: Secondary | ICD-10-CM | POA: Insufficient documentation

## 2022-05-03 DIAGNOSIS — Z79899 Other long term (current) drug therapy: Secondary | ICD-10-CM | POA: Insufficient documentation

## 2022-05-03 DIAGNOSIS — Z01818 Encounter for other preprocedural examination: Secondary | ICD-10-CM

## 2022-05-03 LAB — CBC
HCT: 43.1 % (ref 39.0–52.0)
Hemoglobin: 14.7 g/dL (ref 13.0–17.0)
MCH: 31.1 pg (ref 26.0–34.0)
MCHC: 34.1 g/dL (ref 30.0–36.0)
MCV: 91.1 fL (ref 80.0–100.0)
Platelets: 157 10*3/uL (ref 150–400)
RBC: 4.73 MIL/uL (ref 4.22–5.81)
RDW: 12.1 % (ref 11.5–15.5)
WBC: 4.6 10*3/uL (ref 4.0–10.5)
nRBC: 0 % (ref 0.0–0.2)

## 2022-05-03 LAB — BASIC METABOLIC PANEL
Anion gap: 9 (ref 5–15)
BUN: 12 mg/dL (ref 8–23)
CO2: 29 mmol/L (ref 22–32)
Calcium: 8.9 mg/dL (ref 8.9–10.3)
Chloride: 102 mmol/L (ref 98–111)
Creatinine, Ser: 0.7 mg/dL (ref 0.61–1.24)
GFR, Estimated: >60 mL/min (ref >60)
Glucose, Bld: 138 mg/dL — ABNORMAL HIGH (ref 70–99)
Potassium: 3.8 mmol/L (ref 3.5–5.1)
Sodium: 140 mmol/L (ref 135–145)

## 2022-05-03 LAB — SURGICAL PCR SCREEN
MRSA, PCR: NEGATIVE
Staphylococcus aureus: NEGATIVE

## 2022-05-03 NOTE — Progress Notes (Signed)
PCP - Dr. [name] Cardiologist - Dr. Donato Schultz  PPM/ICD - Denies Device Orders - n/a Rep Notified - n/a  Chest x-ray - 01/26/2021 EKG - 02/26/2022 Stress Test - 02/16/2021 ECHO - 05/16/2021 Cardiac Cath - 02/20/2016  Sleep Study - Denies CPAP - n/a  No DM  Last dose of GLP1 agonist- n/a GLP1 instructions: n/a  Blood Thinner Instructions: n/a Aspirin Instructions: Last dose of ASA was April 24th.  ERAS Protcol - Clear liquids until 0430 morning of surgery PRE-SURGERY Ensure or G2- n/a  COVID TEST- n/a   Anesthesia review: Yes. Cardiac hx including CAD with stent placement and Cardiac Clearance.  Patient denies shortness of breath, fever, cough and chest pain at PAT appointment. Pt denies any respiratory illness/infection in the last two months.   All instructions explained to the patient, with a verbal understanding of the material. Patient agrees to go over the instructions while at home for a better understanding. Patient also instructed to self quarantine after being tested for COVID-19. The opportunity to ask questions was provided.

## 2022-05-04 NOTE — Anesthesia Preprocedure Evaluation (Signed)
Anesthesia Evaluation  Patient identified by MRN, date of birth, ID band Patient awake    Reviewed: Allergy & Precautions, NPO status , Patient's Chart, lab work & pertinent test results  History of Anesthesia Complications (+) PONV, Family history of anesthesia reaction and history of anesthetic complications  Airway Mallampati: III  TM Distance: >3 FB Neck ROM: Full    Dental no notable dental hx. (+) Dental Advisory Given, Teeth Intact,    Pulmonary shortness of breath, former smoker   Pulmonary exam normal breath sounds clear to auscultation       Cardiovascular hypertension, Pt. on medications and Pt. on home beta blockers + CAD and + Cardiac Stents  Normal cardiovascular exam Rhythm:Regular Rate:Normal  Echo 05/16/2021 1. Left ventricular ejection fraction, by estimation, is 60 to 65%. The left ventricle has normal function. The left ventricle has no regional wall motion abnormalities. The left ventricular internal cavity size was mildly dilated. Left ventricular diastolic parameters are consistent with Grade I diastolic dysfunction (impaired relaxation).   2. Right ventricular systolic function is normal. The right ventricular size is normal.   3. The mitral valve is normal in structure. No evidence of mitral valve regurgitation. No evidence of mitral stenosis.   4. The aortic valve is tricuspid. Aortic valve regurgitation is mild to moderate. Aortic valve sclerosis is present, with no evidence of aortic valve stenosis.   5. Aortic dilatation noted. There is mild dilatation of the aortic root, measuring 41 mm. There is mild dilatation of the ascending aorta, measuring 41 mm.   6. The inferior vena cava is normal in size with greater than 50% respiratory variability, suggesting right atrial pressure of 3 mmHg.     Myocardial Perfusion 02/16/2021   The study is normal. The study is low risk.   No ST deviation was noted.   Left  ventricular function is normal. Nuclear stress EF: 59 %. The left ventricular ejection fraction is normal (55-65%). End diastolic cavity size is normal.   Prior study available for comparison from 10/02/2017. No signifcant ischemic defects compared to prior study     Cardiac cath 02/20/2016  Acute Mrg lesion, 90 %stenosed.  Mid RCA lesion, 20 %stenosed.  Dist RCA lesion, 30 %stenosed.  Prox Cx to Mid Cx lesion, 20 %stenosed.  1st Diag lesion, 20 %stenosed.  Dist LAD lesion, 20 %stenosed.  A STENT SYNERGY DES 4X24 drug eluting stent was successfully placed.  Prox RCA to Mid RCA lesion, 80 %stenosed.  Post intervention, there is a 0% residual stenosis.   1. Severe single vessel CAD (severe stenosis mid RCA) 2. Successful PTCA/DES x 1 mid RCA  3. 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    GI/Hepatic Neg liver ROS,GERD  ,,  Endo/Other  negative endocrine ROS    Renal/GU negative Renal ROS     Musculoskeletal  (+) Arthritis ,    Abdominal   Peds  Hematology negative hematology ROS (+)   Anesthesia Other Findings   Reproductive/Obstetrics                             Anesthesia Physical Anesthesia Plan  ASA: 3  Anesthesia Plan: General   Post-op Pain Management: Tylenol PO (pre-op)*   Induction: Intravenous  PONV Risk Score and Plan: 3 and Ondansetron, Dexamethasone and Treatment may vary  due to age or medical condition  Airway Management Planned: Oral ETT and Video Laryngoscope Planned  Additional Equipment: None  Intra-op Plan:   Post-operative Plan: Extubation in OR  Informed Consent: I have reviewed the patients History and Physical, chart, labs and discussed the procedure including the risks, benefits and alternatives for the proposed anesthesia with the patient or authorized representative who has indicated his/her  understanding and acceptance.     Dental advisory given  Plan Discussed with: CRNA  Anesthesia Plan Comments: (PAT note written 05/04/2022 by Shonna Chock, PA-C.  )        Anesthesia Quick Evaluation

## 2022-05-04 NOTE — Progress Notes (Signed)
Anesthesia Chart Review:  Case: [id number] Date/Time: 05/10/22 0715   Procedure: REM L3 PED SCREWS Berline Chough TUBE (Bilateral) - 3C   Anesthesia type: General   Pre-op diagnosis: MECHANICAL COMPLICATION OF INTERNAL FIXATION DEVICE   [facility] / [facility]   Surgeons: [name], MD       DISCUSSION: Patient is a 77 year old male scheduled for the above procedure.  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    He follows with cardiologist Dr. Anne Fu for CAD s/p DES RCA in 2018. Nuclear stress 02/2021 was low risk.  Echo 05/2021 showed EF 60 to 65%, grade 1 DD, mild-moderate AI, 41 mm aortic root and ascending aorta. He was felt acceptable risk for his recent inguinal hernia repair which he ahd on 02/07/22. Since then he had routine evaluation on 02/26/22 with Carlos Levering, NP. He was doing well. He was getting 7000-8000 steps in daily and was about to get released from general surgery to start PT for his back. No chest pain or SOB. Plan for 6 months follow-up with follow-up echo around May 2024 to re-evaluate AI.   Reported last aspirin 05/02/2022.  Anesthesia team to evaluate on the day of surgery.    VS: BP 127/69   Pulse 66   Temp 36.7 C   Resp 18   Ht 5\' 11"  (1.803 m)   Wt 71.7 kg   SpO2 100%   BMI 22.04 kg/m     PROVIDERS: [name], MD is PCP  Donato Schultz, MD is cardiologist [facility]. Arbutus Ped, MD is HEM-ONC. Last visit 03/05/22.  Continue observation for stage Ia non-small cell lung cancer. Lonie Peak, MD is RAD-ONC. Last visit  02/27/22. Shon Millet, DO is neurologist. Last visit 1/22/4.  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 Last visit 03/07/22 with Doree Fudge, PA-C with repeat CT Chest in 1 year recommended. He was referred back to Dr. Conchita Paris for T11-L2 fixation changes.    LABS: Labs reviewed: Acceptable for surgery. A1c 5.9% on 01/26/22.   (all labs ordered are listed, but only abnormal results are displayed)  Labs Reviewed  BASIC METABOLIC PANEL - Abnormal; Notable for the following components:      Result Value   Glucose, Bld 138 (*)    All other components within normal limits  SURGICAL PCR SCREEN  CBC     IMAGES: CT L-spine 04/18/22: IMPRESSION: 1. T11-L3 fusion. Solid arthrodesis is not seen at any level and there is bilateral L3 pedicle screw loosening. 2. Stable appearance of the L1 bone lesion and compression fracture.   MRI Brain 03/21/22: IMPRESSION: 1. Unchanged appearance of left parafalcine lesion with punctate contrast enhancement and internal hyperintense T2-weighted signal, again favored to be benign 2. No other abnormal contrast enhancement or evidence of intracranial metastatic disease. 3. Findings of chronic microvascular ischemia.   CT Chest 03/01/22: IMPRESSION: - Interval surgical changes with fixation from T11 through L2 with deformity and mixed density of the L1 vertebral body. Please correlate with the history the - Stable surgical changes of left upper lobe lobectomy. No developing new mass lesion, fluid collection or lymph node enlargement in the thorax. - Stable area  of ground-glass and bandlike opacity in the right upper lobe. Recommend continued follow-up surveillance. - Stable paraspinal soft tissue nodule to the right of the L1 vertebral body with bony remodeling of the cortex of L1. - Aortic Atherosclerosis (ICD10-I70.0).   EKG: 02/26/22: NSR   CV: Echo 05/16/2021 1. Left ventricular ejection fraction, by estimation, is 60 to 65%. The   left ventricle has normal function. The left ventricle has no regional  wall motion abnormalities. The left ventricular internal cavity size was  mildly dilated. Left ventricular  diastolic parameters are consistent with Grade I diastolic dysfunction  (impaired relaxation).   2. Right ventricular systolic function is normal. The right ventricular  size is normal.   3. The mitral valve is normal in structure. No evidence of mitral valve  regurgitation. No evidence of mitral stenosis.   4. The aortic valve is tricuspid. Aortic valve regurgitation is mild to  moderate. Aortic valve sclerosis is present, with no evidence of aortic  valve stenosis.   5. Aortic dilatation noted. There is mild dilatation of the aortic root,  measuring 41 mm. There is mild dilatation of the ascending aorta,  measuring 41 mm.   6. The inferior vena cava is normal in size with greater than 50%  respiratory variability, suggesting right atrial pressure of 3 mmHg.     Myocardial Perfusion 02/16/2021   The study is normal. The study is low risk.   No ST deviation was noted.   Left ventricular function is normal. Nuclear stress EF: 59 %. The left ventricular ejection fraction is normal (55-65%). End diastolic cavity size is normal.   Prior study available for comparison from 10/02/2017. No signifcant ischemic defects compared to prior study   Cardiac cath 02/20/2016 Acute Mrg lesion, 90 %stenosed. Mid RCA lesion, 20 %stenosed. Dist RCA lesion, 30 %stenosed. Prox Cx to Mid Cx lesion, 20 %stenosed. 1st Diag lesion, 20 %stenosed. Dist LAD lesion, 20 %stenosed. A STENT SYNERGY DES 4X24 drug eluting stent was successfully placed. Prox RCA to Mid RCA lesion, 80 %stenosed. Post intervention, there is a 0% residual stenosis.   1. Severe single vessel CAD (severe stenosis mid RCA) 2. Successful PTCA/DES x 1 mid RCA  3. Mild non-obstructive disease Circumflex and LAD   Recommendations: Will continue DAPT with ASA and  Plavix for one year...   Past Medical History:  Diagnosis Date   Aortic insufficiency    Arthritis    CAD (coronary artery disease)    One Stent placed   Colon polyps    Dyspnea    With exertion related to 1/2 lung removed   Family history of adverse reaction to anesthesia    Mom vomits and Dad went "nuts"   Family history of colon cancer    Family history of stomach cancer    Fatigue    GERD (gastroesophageal reflux disease)    Hemorrhoids    History of echocardiogram    Echo 02/08/16: Mild LVH, EF 55-60, no RWMA, Gr 1 DD, trivial AI, mild LAE   Hyperlipidemia    Hypertension    Lung cancer (HCC)    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   PONV (postoperative nausea and vomiting)    Pre-diabetes    Pulmonary nodules    Thyroid cancer (HCC)    thyroid removed    Past Surgical History:  Procedure Laterality Date   BACK SURGERY     COLONOSCOPY WITH ESOPHAGOGASTRODUODENOSCOPY (EGD)  2008   CORONARY STENT INTERVENTION N/A 02/20/2016   Procedure: Coronary Stent  Intervention;  Surgeon: Kathleene Hazel, MD;  Location: [facility] INVASIVE CV LAB;  Service: Cardiovascular;  Laterality: N/A;   ESOPHAGOGASTRODUODENOSCOPY     INGUINAL HERNIA REPAIR Right 02/07/2022   Procedure: OPEN RIGHT INGUINAL HERNIA REPAIR WITH MESH;  Surgeon: Kinsinger, De Blanch, MD;  Location: [facility] ORS;  Service: General;  Laterality: Right;   LEFT HEART CATH AND CORONARY ANGIOGRAPHY N/A 02/20/2016   Procedure: Left Heart Cath and Coronary Angiography;  Surgeon: Kathleene Hazel, MD;  Location: [facility] INVASIVE CV LAB;  Service: Cardiovascular;  Laterality: N/A;   LUNG REMOVAL, PARTIAL Left 06/24/2018   Pt states "1/2 of lung was removed"   PILONIDAL CYST EXCISION     REFRACTIVE SURGERY Right    retina tear repair   THYROIDECTOMY N/A 01/17/2018   Procedure: TOTAL THYROIDECTOMY;  Surgeon: Darnell Level, MD;  Location: [facility] ORS;  Service: General;  Laterality: N/A;   VIDEO ASSISTED THORACOSCOPY (VATS)/WEDGE RESECTION Left 06/24/2018    Procedure: VIDEO ASSISTED THORACOSCOPY (VATS)/LUNG RESECTION;  Surgeon: Delight Ovens, MD;  Location: [facility] OR;  Service: Thoracic;  Laterality: Left;   VIDEO BRONCHOSCOPY WITH ENDOBRONCHIAL NAVIGATION N/A 11/18/2017   Procedure: VIDEO BRONCHOSCOPY WITH ENDOBRONCHIAL NAVIGATION WITH BIOPSIES OF LEFT AND RIGHT UPPER LOBES;  Surgeon: Delight Ovens, MD;  Location: [facility] OR;  Service: Thoracic;  Laterality: N/A;   VIDEO BRONCHOSCOPY WITH ENDOBRONCHIAL NAVIGATION N/A 06/11/2018   Procedure: VIDEO BRONCHOSCOPY WITH ENDOBRONCHIAL NAVIGATION;  Surgeon: Delight Ovens, MD;  Location: [facility];  Service: Thoracic;  Laterality: N/A;    MEDICATIONS:  acetaminophen (TYLENOL) 500 MG tablet   amLODipine (NORVASC) 10 MG tablet   aspirin EC 81 MG tablet   carvedilol (COREG) 6.25 MG tablet   Coenzyme Q10 (COQ10) 100 MG CAPS   ezetimibe (ZETIA) 10 MG tablet   famotidine (PEPCID) 20 MG tablet   fluticasone (FLONASE) 50 MCG/ACT nasal spray   ibuprofen (ADVIL) 200 MG tablet   ibuprofen (ADVIL) 800 MG tablet   levothyroxine (SYNTHROID) 150 MCG tablet   LORazepam (ATIVAN) 0.5 MG tablet   losartan (COZAAR) 100 MG tablet   nitroGLYCERIN (NITROSTAT) 0.4 MG SL tablet   Omega-3 Fatty Acids (FISH OIL) 1000 MG CAPS   pantoprazole (PROTONIX) 40 MG tablet   pravastatin (PRAVACHOL) 80 MG tablet   zolpidem (AMBIEN) 10 MG tablet   No current facility-administered medications for this encounter.    Shonna Chock, PA-C Surgical Short Stay/Anesthesiology [facility] Phone [phone] [facility] Phone [phone] 05/04/2022 7:41 PM

## 2022-05-07 DIAGNOSIS — T84498D Other mechanical complication of other internal orthopedic devices, implants and grafts, subsequent encounter: Secondary | ICD-10-CM | POA: Diagnosis not present

## 2022-05-08 ENCOUNTER — Other Ambulatory Visit: Payer: Self-pay | Admitting: Neurosurgery

## 2022-05-10 ENCOUNTER — Inpatient Hospital Stay (HOSPITAL_COMMUNITY): Payer: Medicare Other | Admitting: Vascular Surgery

## 2022-05-10 ENCOUNTER — Encounter (HOSPITAL_COMMUNITY): Payer: Self-pay | Admitting: Neurosurgery

## 2022-05-10 ENCOUNTER — Inpatient Hospital Stay (HOSPITAL_COMMUNITY)
Admission: RE | Admit: 2022-05-10 | Discharge: 2022-05-10 | DRG: 496 | Disposition: A | Discharge status: Home / Self Care | Payer: Medicare Other | Attending: Neurosurgery | Admitting: Neurosurgery

## 2022-05-10 ENCOUNTER — Other Ambulatory Visit: Payer: Self-pay

## 2022-05-10 ENCOUNTER — Inpatient Hospital Stay (HOSPITAL_COMMUNITY): Payer: Medicare Other | Admitting: Certified Registered Nurse Anesthetist

## 2022-05-10 ENCOUNTER — Inpatient Hospital Stay (HOSPITAL_COMMUNITY): Payer: Medicare Other

## 2022-05-10 ENCOUNTER — Encounter (HOSPITAL_COMMUNITY)
Admission: RE | Disposition: A | Discharge status: Home / Self Care | Payer: Self-pay | Source: Home / Self Care | Attending: Neurosurgery

## 2022-05-10 DIAGNOSIS — I2510 Atherosclerotic heart disease of native coronary artery without angina pectoris: Secondary | ICD-10-CM | POA: Diagnosis not present

## 2022-05-10 DIAGNOSIS — Z955 Presence of coronary angioplasty implant and graft: Secondary | ICD-10-CM | POA: Diagnosis not present

## 2022-05-10 DIAGNOSIS — T84216A Breakdown (mechanical) of internal fixation device of vertebrae, initial encounter: Secondary | ICD-10-CM | POA: Diagnosis not present

## 2022-05-10 DIAGNOSIS — T85848A Pain due to other internal prosthetic devices, implants and grafts, initial encounter: Principal | ICD-10-CM | POA: Diagnosis present

## 2022-05-10 DIAGNOSIS — E890 Postprocedural hypothyroidism: Secondary | ICD-10-CM | POA: Diagnosis not present

## 2022-05-10 DIAGNOSIS — Z85850 Personal history of malignant neoplasm of thyroid: Secondary | ICD-10-CM

## 2022-05-10 DIAGNOSIS — Y838 Other surgical procedures as the cause of abnormal reaction of the patient, or of later complication, without mention of misadventure at the time of the procedure: Secondary | ICD-10-CM | POA: Diagnosis present

## 2022-05-10 DIAGNOSIS — Z888 Allergy status to other drugs, medicaments and biological substances status: Secondary | ICD-10-CM

## 2022-05-10 DIAGNOSIS — Z79899 Other long term (current) drug therapy: Secondary | ICD-10-CM | POA: Diagnosis not present

## 2022-05-10 DIAGNOSIS — M8448XA Pathological fracture, other site, initial encounter for fracture: Secondary | ICD-10-CM | POA: Diagnosis not present

## 2022-05-10 DIAGNOSIS — R7303 Prediabetes: Secondary | ICD-10-CM | POA: Diagnosis present

## 2022-05-10 DIAGNOSIS — Z800 Family history of malignant neoplasm of digestive organs: Secondary | ICD-10-CM | POA: Diagnosis not present

## 2022-05-10 DIAGNOSIS — I10 Essential (primary) hypertension: Secondary | ICD-10-CM

## 2022-05-10 DIAGNOSIS — T84296A Other mechanical complication of internal fixation device of vertebrae, initial encounter: Secondary | ICD-10-CM | POA: Diagnosis not present

## 2022-05-10 DIAGNOSIS — Z4789 Encounter for other orthopedic aftercare: Secondary | ICD-10-CM | POA: Diagnosis not present

## 2022-05-10 DIAGNOSIS — E785 Hyperlipidemia, unspecified: Secondary | ICD-10-CM | POA: Diagnosis not present

## 2022-05-10 DIAGNOSIS — Z87891 Personal history of nicotine dependence: Secondary | ICD-10-CM | POA: Diagnosis not present

## 2022-05-10 DIAGNOSIS — Z85118 Personal history of other malignant neoplasm of bronchus and lung: Secondary | ICD-10-CM

## 2022-05-10 DIAGNOSIS — T84226A Displacement of internal fixation device of vertebrae, initial encounter: Principal | ICD-10-CM | POA: Diagnosis present

## 2022-05-10 DIAGNOSIS — K219 Gastro-esophageal reflux disease without esophagitis: Secondary | ICD-10-CM | POA: Diagnosis present

## 2022-05-10 DIAGNOSIS — Z79890 Hormone replacement therapy (postmenopausal): Secondary | ICD-10-CM

## 2022-05-10 DIAGNOSIS — Y793 Surgical instruments, materials and orthopedic devices (including sutures) associated with adverse incidents: Secondary | ICD-10-CM | POA: Diagnosis present

## 2022-05-10 DIAGNOSIS — M1990 Unspecified osteoarthritis, unspecified site: Secondary | ICD-10-CM | POA: Diagnosis not present

## 2022-05-10 HISTORY — PX: HARDWARE REMOVAL: SHX979

## 2022-05-10 SURGERY — REMOVAL, HARDWARE
Anesthesia: General | Site: Spine Lumbar | Laterality: Bilateral

## 2022-05-10 MED ORDER — FENTANYL CITRATE (PF) 250 MCG/5ML IJ SOLN
INTRAMUSCULAR | Status: DC | PRN
  Administered 2022-05-10: 50 ug via INTRAVENOUS

## 2022-05-10 MED ORDER — ONDANSETRON HCL 4 MG/2ML IJ SOLN
INTRAMUSCULAR | Status: DC | PRN
  Administered 2022-05-10: 4 mg via INTRAVENOUS

## 2022-05-10 MED ORDER — ROCURONIUM BROMIDE 10 MG/ML (PF) SYRINGE
PREFILLED_SYRINGE | INTRAVENOUS | Status: DC | PRN
  Administered 2022-05-10: 40 mg via INTRAVENOUS
  Administered 2022-05-10: 10 mg via INTRAVENOUS

## 2022-05-10 MED ORDER — HYDROCODONE-ACETAMINOPHEN 5-325 MG PO TABS
1.0000 | ORAL_TABLET | Freq: Once | ORAL | Status: AC
  Administered 2022-05-10: 1 via ORAL

## 2022-05-10 MED ORDER — LIDOCAINE-EPINEPHRINE 1 %-1:100000 IJ SOLN
INTRAMUSCULAR | Status: AC
  Filled 2022-05-10: qty 1

## 2022-05-10 MED ORDER — BUPIVACAINE HCL (PF) 0.5 % IJ SOLN
INTRAMUSCULAR | Status: DC | PRN
  Administered 2022-05-10: 9 mL

## 2022-05-10 MED ORDER — PHENYLEPHRINE HCL-NACL 20-0.9 MG/250ML-% IV SOLN
INTRAVENOUS | Status: DC | PRN
  Administered 2022-05-10: 25 ug/min via INTRAVENOUS

## 2022-05-10 MED ORDER — DEXAMETHASONE SODIUM PHOSPHATE 10 MG/ML IJ SOLN
INTRAMUSCULAR | Status: AC
  Filled 2022-05-10: qty 1

## 2022-05-10 MED ORDER — EPHEDRINE SULFATE-NACL 50-0.9 MG/10ML-% IV SOSY
PREFILLED_SYRINGE | INTRAVENOUS | Status: DC | PRN
  Administered 2022-05-10 (×2): 2.5 mg via INTRAVENOUS

## 2022-05-10 MED ORDER — DEXAMETHASONE SODIUM PHOSPHATE 10 MG/ML IJ SOLN
INTRAMUSCULAR | Status: DC | PRN
  Administered 2022-05-10: 10 mg via INTRAVENOUS

## 2022-05-10 MED ORDER — LIDOCAINE 2% (20 MG/ML) 5 ML SYRINGE
INTRAMUSCULAR | Status: DC | PRN
  Administered 2022-05-10: 60 mg via INTRAVENOUS

## 2022-05-10 MED ORDER — PROPOFOL 10 MG/ML IV BOLUS
INTRAVENOUS | Status: DC | PRN
  Administered 2022-05-10: 120 mg via INTRAVENOUS

## 2022-05-10 MED ORDER — ONDANSETRON HCL 4 MG/2ML IJ SOLN
INTRAMUSCULAR | Status: AC
  Filled 2022-05-10: qty 2

## 2022-05-10 MED ORDER — 0.9 % SODIUM CHLORIDE (POUR BTL) OPTIME
TOPICAL | Status: DC | PRN
  Administered 2022-05-10: 1000 mL

## 2022-05-10 MED ORDER — PHENYLEPHRINE 80 MCG/ML (10ML) SYRINGE FOR IV PUSH (FOR BLOOD PRESSURE SUPPORT)
PREFILLED_SYRINGE | INTRAVENOUS | Status: AC
  Filled 2022-05-10: qty 10

## 2022-05-10 MED ORDER — PHENYLEPHRINE 80 MCG/ML (10ML) SYRINGE FOR IV PUSH (FOR BLOOD PRESSURE SUPPORT)
PREFILLED_SYRINGE | INTRAVENOUS | Status: DC | PRN
  Administered 2022-05-10 (×4): 80 ug via INTRAVENOUS

## 2022-05-10 MED ORDER — ACETAMINOPHEN 500 MG PO TABS
ORAL_TABLET | ORAL | Status: AC
  Administered 2022-05-10: 1000 mg via ORAL
  Filled 2022-05-10: qty 2

## 2022-05-10 MED ORDER — ORAL CARE MOUTH RINSE: 15.0000 mL | Freq: Once | OROMUCOSAL | Status: AC

## 2022-05-10 MED ORDER — MEPERIDINE HCL 25 MG/ML IJ SOLN: 6.2500 mg | INTRAMUSCULAR | Status: DC | PRN

## 2022-05-10 MED ORDER — SUGAMMADEX SODIUM 200 MG/2ML IV SOLN
INTRAVENOUS | Status: DC | PRN
  Administered 2022-05-10 (×2): 100 mg via INTRAVENOUS

## 2022-05-10 MED ORDER — CEFAZOLIN SODIUM-DEXTROSE 2-3 GM-%(50ML) IV SOLR
INTRAVENOUS | Status: DC | PRN
  Administered 2022-05-10: 2 g via INTRAVENOUS

## 2022-05-10 MED ORDER — CEFAZOLIN SODIUM 1 G IJ SOLR
INTRAMUSCULAR | Status: AC
  Filled 2022-05-10: qty 20

## 2022-05-10 MED ORDER — BUPIVACAINE HCL (PF) 0.5 % IJ SOLN
INTRAMUSCULAR | Status: AC
  Filled 2022-05-10: qty 30

## 2022-05-10 MED ORDER — ROCURONIUM BROMIDE 10 MG/ML (PF) SYRINGE
PREFILLED_SYRINGE | INTRAVENOUS | Status: AC
  Filled 2022-05-10: qty 10

## 2022-05-10 MED ORDER — LIDOCAINE-EPINEPHRINE 1 %-1:100000 IJ SOLN
INTRAMUSCULAR | Status: DC | PRN
  Administered 2022-05-10: 9 mL

## 2022-05-10 MED ORDER — FENTANYL CITRATE (PF) 250 MCG/5ML IJ SOLN
INTRAMUSCULAR | Status: AC
  Filled 2022-05-10: qty 5

## 2022-05-10 MED ORDER — HYDROCODONE-ACETAMINOPHEN 5-325 MG PO TABS: 1.0000 | ORAL_TABLET | Freq: Four times a day (QID) | ORAL | 0 refills | Status: DC | PRN

## 2022-05-10 MED ORDER — LIDOCAINE 2% (20 MG/ML) 5 ML SYRINGE
INTRAMUSCULAR | Status: AC
  Filled 2022-05-10: qty 5

## 2022-05-10 MED ORDER — HYDROMORPHONE HCL 1 MG/ML IJ SOLN
INTRAMUSCULAR | Status: AC
  Filled 2022-05-10: qty 1

## 2022-05-10 MED ORDER — CHLORHEXIDINE GLUCONATE 0.12 % MT SOLN
15.0000 mL | Freq: Once | OROMUCOSAL | Status: AC
  Administered 2022-05-10: 15 mL via OROMUCOSAL
  Filled 2022-05-10: qty 15

## 2022-05-10 MED ORDER — HYDROCODONE-ACETAMINOPHEN 5-325 MG PO TABS
ORAL_TABLET | ORAL | Status: AC
  Filled 2022-05-10: qty 1

## 2022-05-10 MED ORDER — HYDROMORPHONE HCL 1 MG/ML IJ SOLN
0.2500 mg | INTRAMUSCULAR | Status: DC | PRN
  Administered 2022-05-10 (×2): 0.5 mg via INTRAVENOUS

## 2022-05-10 MED ORDER — LORAZEPAM 0.5 MG PO TABS: 0.5000 mg | ORAL_TABLET | Freq: Every day | ORAL | 0 refills | Status: DC | PRN

## 2022-05-10 MED ORDER — PROPOFOL 10 MG/ML IV BOLUS
INTRAVENOUS | Status: AC
  Filled 2022-05-10: qty 20

## 2022-05-10 MED ORDER — AMISULPRIDE (ANTIEMETIC) 5 MG/2ML IV SOLN: 10.0000 mg | Freq: Once | INTRAVENOUS | Status: DC | PRN

## 2022-05-10 MED ORDER — LACTATED RINGERS IV SOLN: INTRAVENOUS | Status: DC

## 2022-05-10 MED ORDER — THROMBIN 5000 UNITS EX SOLR
CUTANEOUS | Status: AC
  Filled 2022-05-10: qty 10000

## 2022-05-10 MED ORDER — EPHEDRINE 5 MG/ML INJ
INTRAVENOUS | Status: AC
  Filled 2022-05-10: qty 5

## 2022-05-10 MED ORDER — THROMBIN 20000 UNITS EX SOLR
CUTANEOUS | Status: AC
  Filled 2022-05-10: qty 20000

## 2022-05-10 MED ORDER — ACETAMINOPHEN 500 MG PO TABS: 1000.0000 mg | ORAL_TABLET | Freq: Once | ORAL | Status: AC

## 2022-05-10 MED ORDER — MIDAZOLAM HCL 2 MG/2ML IJ SOLN
INTRAMUSCULAR | Status: AC
  Filled 2022-05-10: qty 2

## 2022-05-10 MED ORDER — MIDAZOLAM HCL 2 MG/2ML IJ SOLN
INTRAMUSCULAR | Status: DC | PRN
  Administered 2022-05-10 (×2): 1 mg via INTRAVENOUS

## 2022-05-10 MED ORDER — THROMBIN 5000 UNITS EX SOLR: OROMUCOSAL | Status: DC | PRN

## 2022-05-10 SURGICAL SUPPLY — 46 items
ADH SKN CLS APL DERMABOND .7 (GAUZE/BANDAGES/DRESSINGS) ×2
BAG COUNTER SPONGE SURGICOUNT (BAG) ×2 IMPLANT
BAG SPNG CNTER NS LX DISP (BAG) ×1
BLADE CLIPPER SURG (BLADE) IMPLANT
BLADE SURG 11 STRL SS (BLADE) ×2 IMPLANT
CANISTER SUCT 3000ML PPV (MISCELLANEOUS) ×2 IMPLANT
DERMABOND ADVANCED .7 DNX12 (GAUZE/BANDAGES/DRESSINGS) ×2 IMPLANT
DRAPE LAPAROTOMY 100X72X124 (DRAPES) ×2 IMPLANT
DRSG OPSITE POSTOP 3X4 (GAUZE/BANDAGES/DRESSINGS) ×2 IMPLANT
DRSG OPSITE POSTOP 4X6 (GAUZE/BANDAGES/DRESSINGS) IMPLANT
DURAPREP 26ML APPLICATOR (WOUND CARE) ×2 IMPLANT
ELECT REM PT RETURN 9FT ADLT (ELECTROSURGICAL) ×1
ELECTRODE REM PT RTRN 9FT ADLT (ELECTROSURGICAL) ×2 IMPLANT
GAUZE 4X4 16PLY ~~LOC~~+RFID DBL (SPONGE) IMPLANT
GAUZE SPONGE 4X4 12PLY STRL (GAUZE/BANDAGES/DRESSINGS) IMPLANT
GLOVE BIO SURGEON STRL SZ7.5 (GLOVE) IMPLANT
GLOVE BIOGEL PI IND STRL 7.5 (GLOVE) ×4 IMPLANT
GLOVE ECLIPSE 7.0 STRL STRAW (GLOVE) ×2 IMPLANT
GLOVE EXAM NITRILE XL STR (GLOVE) IMPLANT
GOWN STRL REUS W/ TWL LRG LVL3 (GOWN DISPOSABLE) ×4 IMPLANT
GOWN STRL REUS W/ TWL XL LVL3 (GOWN DISPOSABLE) IMPLANT
GOWN STRL REUS W/TWL 2XL LVL3 (GOWN DISPOSABLE) IMPLANT
GOWN STRL REUS W/TWL LRG LVL3 (GOWN DISPOSABLE) ×1
GOWN STRL REUS W/TWL XL LVL3 (GOWN DISPOSABLE) ×2
HEMOSTAT POWDER KIT SURGIFOAM (HEMOSTASIS) ×2 IMPLANT
KIT BASIN OR (CUSTOM PROCEDURE TRAY) ×2 IMPLANT
KIT TURNOVER KIT B (KITS) ×2 IMPLANT
NDL HYPO 18GX1.5 BLUNT FILL (NEEDLE) IMPLANT
NDL HYPO 25X1 1.5 SAFETY (NEEDLE) ×2 IMPLANT
NDL SPNL 18GX3.5 QUINCKE PK (NEEDLE) IMPLANT
NEEDLE HYPO 18GX1.5 BLUNT FILL (NEEDLE) IMPLANT
NEEDLE HYPO 25X1 1.5 SAFETY (NEEDLE) ×1 IMPLANT
NEEDLE SPNL 18GX3.5 QUINCKE PK (NEEDLE) ×1 IMPLANT
NS IRRIG 1000ML POUR BTL (IV SOLUTION) ×2 IMPLANT
PACK LAMINECTOMY NEURO (CUSTOM PROCEDURE TRAY) ×2 IMPLANT
PAD ARMBOARD 7.5X6 YLW CONV (MISCELLANEOUS) ×6 IMPLANT
RASP HELIOCORDIAL MED (MISCELLANEOUS) IMPLANT
SPONGE T-LAP 4X18 ~~LOC~~+RFID (SPONGE) IMPLANT
STRIP CLOSURE SKIN 1/2X4 (GAUZE/BANDAGES/DRESSINGS) IMPLANT
SUT VIC AB 0 CT1 18XCR BRD8 (SUTURE) ×2 IMPLANT
SUT VIC AB 0 CT1 8-18 (SUTURE) ×1
SUT VIC AB 2-0 CT1 18 (SUTURE) IMPLANT
SUT VICRYL 3-0 RB1 18 ABS (SUTURE) ×4 IMPLANT
TOWEL GREEN STERILE (TOWEL DISPOSABLE) ×2 IMPLANT
TOWEL GREEN STERILE FF (TOWEL DISPOSABLE) ×2 IMPLANT
WATER STERILE IRR 1000ML POUR (IV SOLUTION) ×2 IMPLANT

## 2022-05-10 NOTE — Transfer of Care (Signed)
Immediate Anesthesia Transfer of Care Note  Patient: Miguel Wolfe  Procedure(s) Performed: REMOVAL OF THORACIC ELEVEN AND LUMBAR THREE PEDICLE SCREWS AND ROD SEGMENTS (Bilateral: Spine Lumbar)  Patient Location: PACU  Anesthesia Type:General  Level of Consciousness: drowsy and patient cooperative  Airway & Oxygen Therapy: Patient Spontanous Breathing and Patient connected to face mask oxygen  Post-op Assessment: Report given to RN and Post -op Vital signs reviewed and stable  Post vital signs: Reviewed and stable  Last Vitals:  Vitals Value Taken Time  BP 110/66 05/10/22 0941  Temp    Pulse 69 05/10/22 0943  Resp 22 05/10/22 0943  SpO2 97 % 05/10/22 0943  Vitals shown include unvalidated device data.  Last Pain:  Vitals:   05/10/22 0634  TempSrc:   PainSc: 4          Complications: No notable events documented.

## 2022-05-10 NOTE — Discharge Summary (Signed)
Physician Discharge Summary  Patient ID: Miguel Wolfe MRN: [id number] DOB/AGE: 08/04/1945 77 y.o.  Admit date: 05/10/2022 Discharge date: 05/10/2022  Admission Diagnoses:  Hardware loosening  Discharge Diagnoses:  Same Active Problems:   * No active hospital problems. *   Discharged Condition: Stable  Hospital Course:  Miguel Wolfe is a 77 y.o. male who underwent elective uncomplicated removal of T11 and L3 pedicle screws.  Patient was at baseline postoperatively with pain under control with oral medication.  He was discharged home in stable condition.  Treatments: Surgery -removal of T11 and L3 pedicle screws  Discharge Exam: Blood pressure 124/77, pulse 64, temperature 98.6 F (37 C), temperature source Oral, resp. rate 17, height 5\' 11"  (1.803 m), weight 70.8 kg, SpO2 97 %. Awake, alert, oriented Speech fluent, appropriate CN grossly intact 5/5 BUE/BLE Wound c/d/i  Disposition: Discharge disposition: 01-Home or Self Care       Discharge Instructions     Call MD for:  redness, tenderness, or signs of infection (pain, swelling, redness, odor or green/yellow discharge around incision site)   Complete by: As directed    Call MD for:  temperature >100.4   Complete by: As directed    Diet - low sodium heart healthy   Complete by: As directed    Discharge instructions   Complete by: As directed    Walk at home as much as possible, at least 4 times / day   Increase activity slowly   Complete by: As directed    Lifting restrictions   Complete by: As directed    No lifting > 10 lbs   May shower / Bathe   Complete by: As directed    48 hours after surgery   May walk up steps   Complete by: As directed    Other Restrictions   Complete by: As directed    No bending/twisting at waist   Remove dressing in 48 hours   Complete by: As directed       Allergies as of 05/10/2022       Reactions   Propranolol Shortness Of Breath   SOB Only had issue with Slow  release propranolol   Gemfibrozil Other (See Comments)   Muscle pain   Niaspan [niacin Er] Rash, Other (See Comments)   Flushing - aspirin did not mitigate    Simvastatin Other (See Comments)   Drug-Drug interaction with amlodipine        Medication List     TAKE these medications    acetaminophen 500 MG tablet Commonly known as: TYLENOL Take 500 mg by mouth every 6 (six) hours as needed for moderate pain (Back pain).   amLODipine 10 MG tablet Commonly known as: NORVASC Take 10 mg by mouth daily.   aspirin EC 81 MG tablet Take 81 mg by mouth at bedtime.   carvedilol 6.25 MG tablet Commonly known as: COREG TAKE 1 TABLET(6.25 MG) BY MOUTH TWICE DAILY WITH A MEAL   CoQ10 100 MG Caps Take 100 mg by mouth daily.   ezetimibe 10 MG tablet Commonly known as: ZETIA TAKE 1 TABLET(10 MG) BY MOUTH DAILY   famotidine 20 MG tablet Commonly known as: PEPCID Take 20 mg by mouth daily.   Fish Oil 1000 MG Caps Take 2,000 mg by mouth in the morning and at bedtime.   fluticasone 50 MCG/ACT nasal spray Commonly known as: FLONASE Place 1 spray into both nostrils daily as needed for allergies or rhinitis (Spring and Fall).  HYDROcodone-acetaminophen 5-325 MG tablet Commonly known as: NORCO/VICODIN Take 1 tablet by mouth every 6 (six) hours as needed for moderate pain.   ibuprofen 200 MG tablet Commonly known as: ADVIL Take 200 mg by mouth every 6 (six) hours as needed for moderate pain. What changed: Another medication with the same name was removed. Continue taking this medication, and follow the directions you see here.   levothyroxine 150 MCG tablet Commonly known as: SYNTHROID Take 150 mcg by mouth daily before breakfast.   LORazepam 0.5 MG tablet Commonly known as: ATIVAN Take 1 tablet (0.5 mg total) by mouth daily as needed for anxiety (ONLY FOR MRIs).   losartan 100 MG tablet Commonly known as: COZAAR Take 100 mg by mouth daily.   nitroGLYCERIN 0.4 MG SL  tablet Commonly known as: NITROSTAT PLACE 1 TABLET UNDER THE TONGUE EVERY 5 MINUTES AS NEEDED FOR CHEST PAIN   pantoprazole 40 MG tablet Commonly known as: PROTONIX Take 40 mg by mouth every evening.   pravastatin 80 MG tablet Commonly known as: PRAVACHOL TAKE 1 TABLET(80 MG) BY MOUTH DAILY   zolpidem 10 MG tablet Commonly known as: AMBIEN Take 10 mg by mouth at bedtime.        Follow-up Information     [name], MD Follow up.   Specialty: Neurosurgery Contact information: [address] [place] [address] [phone]                 Signed: Jackelyn Hoehn 05/10/2022, 9:42 AM

## 2022-05-10 NOTE — Op Note (Signed)
  NEUROSURGERY OPERATIVE NOTE   PREOP DIAGNOSIS:  Hardware malfunction/loosening from previous T11-L3 instrumented stabilization   POSTOP DIAGNOSIS: Same  PROCEDURE: Removal of bilateral T11 and L3 pedicle screws  SURGEON: Dr. [name], MD  ASSISTANT: None  ANESTHESIA: General Endotracheal  EBL: Minimal  SPECIMENS: None  DRAINS: None  COMPLICATIONS: None immediate  CONDITION: Hemodynamically stable to PACU  HISTORY: Miguel Wolfe is a 77 y.o. male with a history of aggressive hemangioma who previously underwent T11-L3 instrumented stabilization for related pathologic fracture at L1.  Patient has complained of severe back pain at the lower end of the incision, and significant discomfort related to palpable superior screws.  CT scan did reveal loosening of the L3 screws.  He therefore elected to proceed with removal of both the T11 and L3 pedicle screws.  The risks, benefits, and alternatives to surgery as well as the expected postoperative course and recovery were all reviewed in detail with the patient and his wife.  After all questions were answered informed consent was obtained and witnessed.  PROCEDURE IN DETAIL: The patient was brought to the operating room. After induction of general anesthesia, the patient was positioned on the operative table in the prone position. All pressure points were meticulously padded. Skin incision was then marked out and prepped and draped in the usual sterile fashion.  After timeout was conducted, AP fluoroscopy was used to identify the surface projection of the T11 pedicle screws bilaterally.  Small paramedian incisions were made bilaterally overlying the T11 pedicle screws.  Nasal speculum was introduced and the setscrews were removed.  Drill cutting bur was used to cut the rod just inferior to the T11 screws.  Screws were then removed easily.  Hemostasis was easily secured with morselized Gelfoam and thrombin.  In a similar fashion,  surface projection of the L3 pedicle screws was also identified.  Small bilateral paramedian incisions were infiltrated with local anesthetic with epinephrine.  Speculum was introduced to identify the pedicle screws.  Setscrews were removed.  High-speed drill was used to cut the rod just superior to the L3 screws.  Pedicle screws were noted to be loose and removed with rongeur's.  Hemostasis was secured with morselized Gelfoam and thrombin.  The 4 small incisions were then closed with 0 Vicryl and 3-0 Vicryl and a layer of Dermabond.  At the end of the case all sponge, needle, instrument, and cottonoid counts were correct.  Patient was then transferred to the stretcher, extubated, and taken to the postanesthesia care unit in stable hemodynamic condition.   Miguel Renshaw, MD [facility]

## 2022-05-10 NOTE — Anesthesia Procedure Notes (Signed)
Procedure Name: Intubation Date/Time: 05/10/2022 8:11 AM  Performed by: Audie Pinto, CRNAPre-anesthesia Checklist: Patient identified, Emergency Drugs available, Suction available and Patient being monitored Patient Re-evaluated:Patient Re-evaluated prior to induction Oxygen Delivery Method: Circle system utilized Preoxygenation: Pre-oxygenation with 100% oxygen Induction Type: IV induction Ventilation: Mask ventilation without difficulty Laryngoscope Size: Glidescope and 3 Grade View: Grade I Tube type: Oral Tube size: 7.0 mm Number of attempts: 1 Airway Equipment and Method: Stylet and Oral airway Placement Confirmation: ETT inserted through vocal cords under direct vision, positive ETCO2 and breath sounds checked- equal and bilateral Secured at: 22 cm Tube secured with: Tape Dental Injury: Teeth and Oropharynx as per pre-operative assessment  Comments: Elective glidescope/hx of glidescope intubation. Hx of vocal cord weakness. 7.0 ett easily passed.

## 2022-05-10 NOTE — H&P (Signed)
Chief Complaint   Loose screws  History of Present Illness  Miguel Wolfe is a 77 y.o. male initially seen in the outpatient neurosurgery clinic with aggressive L1 hemangioma.  He ultimately underwent instrumented stabilization for pathologic fracture related to the hemangioma T11-L3.  He subsequently underwent fractionated radiosurgery for treatment of the lesion.  Unfortunately, the patient has complained of low back pain at the inferior margin of the incision.  Workup has included CT scan demonstrating loosening of the L3 screws.  He also complains of significant pain related to the superior screws and proximity to the skin.  After lengthy discussion we have elected to remove the top and bottom screws.  Past Medical History   Past Medical History:  Diagnosis Date   Aortic insufficiency    Arthritis    CAD (coronary artery disease)    One Stent placed   Colon polyps    Dyspnea    With exertion related to 1/2 lung removed   Family history of adverse reaction to anesthesia    Mom vomits and Dad went "nuts"   Family history of colon cancer    Family history of stomach cancer    Fatigue    GERD (gastroesophageal reflux disease)    Hemorrhoids    History of echocardiogram    Echo 02/08/16: Mild LVH, EF 55-60, no RWMA, Gr 1 DD, trivial AI, mild LAE   Hyperlipidemia    Hypertension    Lung cancer (HCC)    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   PONV (postoperative nausea and vomiting)    Pre-diabetes    Pulmonary nodules    Thyroid cancer (HCC)    thyroid removed    Past Surgical History   Past Surgical History:  Procedure Laterality Date   BACK SURGERY     COLONOSCOPY WITH ESOPHAGOGASTRODUODENOSCOPY (EGD)  2008   CORONARY STENT INTERVENTION N/A 02/20/2016   Procedure: Coronary Stent Intervention;  Surgeon: Kathleene Hazel, MD;  Location: [facility] INVASIVE CV LAB;  Service: Cardiovascular;  Laterality: N/A;   ESOPHAGOGASTRODUODENOSCOPY     INGUINAL HERNIA REPAIR Right 02/07/2022   Procedure:  OPEN RIGHT INGUINAL HERNIA REPAIR WITH MESH;  Surgeon: Kinsinger, De Blanch, MD;  Location: [facility] ORS;  Service: General;  Laterality: Right;   LEFT HEART CATH AND CORONARY ANGIOGRAPHY N/A 02/20/2016   Procedure: Left Heart Cath and Coronary Angiography;  Surgeon: Kathleene Hazel, MD;  Location: [facility] INVASIVE CV LAB;  Service: Cardiovascular;  Laterality: N/A;   LUNG REMOVAL, PARTIAL Left 06/24/2018   Pt states "1/2 of lung was removed"   PILONIDAL CYST EXCISION     REFRACTIVE SURGERY Right    retina tear repair   THYROIDECTOMY N/A 01/17/2018   Procedure: TOTAL THYROIDECTOMY;  Surgeon: Darnell Level, MD;  Location: [facility] ORS;  Service: General;  Laterality: N/A;   VIDEO ASSISTED THORACOSCOPY (VATS)/WEDGE RESECTION Left 06/24/2018   Procedure: VIDEO ASSISTED THORACOSCOPY (VATS)/LUNG RESECTION;  Surgeon: Delight Ovens, MD;  Location: [facility] OR;  Service: Thoracic;  Laterality: Left;   VIDEO BRONCHOSCOPY WITH ENDOBRONCHIAL NAVIGATION N/A 11/18/2017   Procedure: VIDEO BRONCHOSCOPY WITH ENDOBRONCHIAL NAVIGATION WITH BIOPSIES OF LEFT AND RIGHT UPPER LOBES;  Surgeon: Delight Ovens, MD;  Location: [facility] OR;  Service: Thoracic;  Laterality: N/A;   VIDEO BRONCHOSCOPY WITH ENDOBRONCHIAL NAVIGATION N/A 06/11/2018   Procedure: VIDEO BRONCHOSCOPY WITH ENDOBRONCHIAL NAVIGATION;  Surgeon: Delight Ovens, MD;  Location: [facility] OR;  Service: Thoracic;  Laterality: N/A;    Social History   Social History   Tobacco Use  Smoking status: Former    Packs/day: 1.00    Years: 3.00    Additional pack years: 0.00    Total pack years: 3.00    Types: Cigarettes    Quit date: 02/01/1967    Years since quitting: 55.3   Smokeless tobacco: Never  Vaping Use   Vaping Use: Never used  Substance Use Topics   Alcohol use: Yes    Comment: a drink before dinner a few times a week (wine or a mixed drink)   Drug use: No    Medications   Prior to Admission medications   Medication Sig Start Date End Date Taking?  Authorizing Provider  acetaminophen (TYLENOL) 500 MG tablet Take 500 mg by mouth every 6 (six) hours as needed for moderate pain (Back pain).   Yes [provider]  amLODipine (NORVASC) 10 MG tablet Take 10 mg by mouth daily. 12/16/20  Yes [provider]  aspirin EC 81 MG tablet Take 81 mg by mouth at bedtime.   Yes [provider]  carvedilol (COREG) 6.25 MG tablet TAKE 1 TABLET(6.25 MG) BY MOUTH TWICE DAILY WITH A MEAL 01/31/22  Yes Jake Bathe, MD  Coenzyme Q10 (COQ10) 100 MG CAPS Take 100 mg by mouth daily.   Yes [provider]  ezetimibe (ZETIA) 10 MG tablet TAKE 1 TABLET(10 MG) BY MOUTH DAILY 10/16/21  Yes Jake Bathe, MD  famotidine (PEPCID) 20 MG tablet Take 20 mg by mouth daily.   Yes [provider]  fluticasone (FLONASE) 50 MCG/ACT nasal spray Place 1 spray into both nostrils daily as needed for allergies or rhinitis (Spring and Fall).   Yes [provider]  ibuprofen (ADVIL) 200 MG tablet Take 200 mg by mouth every 6 (six) hours as needed for moderate pain.   Yes [provider]  levothyroxine (SYNTHROID) 150 MCG tablet Take 150 mcg by mouth daily before breakfast.   Yes [provider]  LORazepam (ATIVAN) 0.5 MG tablet Take 0.5 mg by mouth daily as needed for anxiety (ONLY FOR MRIs).   Yes [provider]  losartan (COZAAR) 100 MG tablet Take 100 mg by mouth daily.   Yes [provider]  nitroGLYCERIN (NITROSTAT) 0.4 MG SL tablet PLACE 1 TABLET UNDER THE TONGUE EVERY 5 MINUTES AS NEEDED FOR CHEST PAIN 08/08/21  Yes Jake Bathe, MD  Omega-3 Fatty Acids (FISH OIL) 1000 MG CAPS Take 2,000 mg by mouth in the morning and at bedtime.   Yes [provider]  pantoprazole (PROTONIX) 40 MG tablet Take 40 mg by mouth every evening. 01/19/20  Yes [provider]  pravastatin (PRAVACHOL) 80 MG tablet TAKE 1 TABLET(80 MG) BY MOUTH DAILY 04/16/22  Yes Jake Bathe, MD  zolpidem (AMBIEN) 10  MG tablet Take 10 mg by mouth at bedtime. 04/29/17  Yes [provider]  ibuprofen (ADVIL) 800 MG tablet Take 1 tablet (800 mg total) by mouth every 8 (eight) hours as needed. Patient not taking: Reported on 04/30/2022 02/07/22   Kinsinger, De Blanch, MD    Allergies   Allergies  Allergen Reactions   Propranolol Shortness Of Breath    SOB  Only had issue with Slow release propranolol   Gemfibrozil Other (See Comments)    Muscle pain   Niaspan [Niacin Er] Rash and Other (See Comments)    Flushing - aspirin did not mitigate    Simvastatin Other (See Comments)    Drug-Drug interaction with amlodipine    Review of  Systems  ROS  Neurologic Exam  Awake, alert, oriented Memory and concentration grossly intact Speech fluent, appropriate CN grossly intact Motor exam: Upper Extremities Deltoid Bicep Tricep Grip  Right 5/5 5/5 5/5 5/5  Left 5/5 5/5 5/5 5/5   Lower Extremities IP Quad PF DF EHL  Right 5/5 5/5 5/5 5/5 5/5  Left 5/5 5/5 5/5 5/5 5/5   Sensation grossly intact to LT  Impression  - 77 y.o. male status post T11-L3 instrumented stabilization with loosening of the L3 screws and significant pain related to the superior screws.  We will therefore remove these top and bottom screws with remaining single level instrumentation  Plan  -We will proceed with removal of T11 and L3 pedicle screws  I have reviewed the indications for the procedure as well as the details of the procedure and the expected postoperative course and recovery at length with the patient in the office. We have also reviewed in detail the risks, benefits, and alternatives to the procedure. All questions were answered and Romona Curls provided informed consent to proceed.  [name], MD [facility]

## 2022-05-10 NOTE — Anesthesia Postprocedure Evaluation (Signed)
Anesthesia Post Note  Patient: Miguel Wolfe  Procedure(s) Performed: REMOVAL OF THORACIC ELEVEN AND LUMBAR THREE PEDICLE SCREWS AND ROD SEGMENTS (Bilateral: Spine Lumbar)     Patient location during evaluation: PACU Anesthesia Type: General Level of consciousness: sedated and patient cooperative Pain management: pain level controlled Vital Signs Assessment: post-procedure vital signs reviewed and stable Respiratory status: spontaneous breathing Cardiovascular status: stable Anesthetic complications: no   No notable events documented.  Last Vitals:  Vitals:   05/10/22 1015 05/10/22 1025  BP: 125/76 130/74  Pulse: 72 71  Resp: (!) 21 17  Temp:  (!) 36.2 C  SpO2: 93% 95%    Last Pain:  Vitals:   05/10/22 1025  TempSrc:   PainSc: 0-No pain                 [name]

## 2022-05-11 ENCOUNTER — Other Ambulatory Visit: Payer: Medicare Other

## 2022-05-11 ENCOUNTER — Encounter (HOSPITAL_COMMUNITY): Payer: Self-pay | Admitting: Neurosurgery

## 2022-05-14 ENCOUNTER — Encounter (HOSPITAL_COMMUNITY): Payer: Self-pay | Admitting: Neurosurgery

## 2022-05-28 ENCOUNTER — Ambulatory Visit (HOSPITAL_COMMUNITY): Payer: Medicare Other | Attending: Cardiovascular Disease

## 2022-05-28 DIAGNOSIS — I351 Nonrheumatic aortic (valve) insufficiency: Secondary | ICD-10-CM | POA: Diagnosis not present

## 2022-05-28 LAB — ECHOCARDIOGRAM COMPLETE
Area-P 1/2: 2.39 cm2
P 1/2 time: 436 msec
S' Lateral: 3.8 cm

## 2022-05-29 ENCOUNTER — Ambulatory Visit
Admission: RE | Admit: 2022-05-29 | Discharge: 2022-05-29 | Disposition: A | Discharge status: Home / Self Care | Payer: Medicare Other | Source: Ambulatory Visit | Attending: General Surgery | Admitting: General Surgery

## 2022-05-29 DIAGNOSIS — N503 Cyst of epididymis: Secondary | ICD-10-CM | POA: Diagnosis not present

## 2022-05-29 DIAGNOSIS — I861 Scrotal varices: Secondary | ICD-10-CM

## 2022-06-07 DIAGNOSIS — K4090 Unilateral inguinal hernia, without obstruction or gangrene, not specified as recurrent: Secondary | ICD-10-CM | POA: Diagnosis not present

## 2022-06-07 DIAGNOSIS — Z8719 Personal history of other diseases of the digestive system: Secondary | ICD-10-CM | POA: Diagnosis not present

## 2022-06-07 DIAGNOSIS — Z9889 Other specified postprocedural states: Secondary | ICD-10-CM | POA: Diagnosis not present

## 2022-06-18 DIAGNOSIS — H25813 Combined forms of age-related cataract, bilateral: Secondary | ICD-10-CM | POA: Diagnosis not present

## 2022-06-18 DIAGNOSIS — Z8669 Personal history of other diseases of the nervous system and sense organs: Secondary | ICD-10-CM | POA: Diagnosis not present

## 2022-06-18 DIAGNOSIS — H35371 Puckering of macula, right eye: Secondary | ICD-10-CM | POA: Diagnosis not present

## 2022-06-18 DIAGNOSIS — H40013 Open angle with borderline findings, low risk, bilateral: Secondary | ICD-10-CM | POA: Diagnosis not present

## 2022-07-17 DIAGNOSIS — H35371 Puckering of macula, right eye: Secondary | ICD-10-CM | POA: Diagnosis not present

## 2022-07-17 DIAGNOSIS — H4312 Vitreous hemorrhage, left eye: Secondary | ICD-10-CM | POA: Diagnosis not present

## 2022-07-17 DIAGNOSIS — H35363 Drusen (degenerative) of macula, bilateral: Secondary | ICD-10-CM | POA: Diagnosis not present

## 2022-07-17 DIAGNOSIS — H43812 Vitreous degeneration, left eye: Secondary | ICD-10-CM | POA: Diagnosis not present

## 2022-07-24 NOTE — Progress Notes (Signed)
Miguel Wolfe presents today for follow up after completing radiation treatment to his spine. He completed treatment on 01-24-22. We will review MRI results from 07-26-22 with this visit as well.   Recent neurologic symptoms, if any:  Seizures: No Headaches: No Nausea: No Dizziness/ataxia: No Difficulty with hand coordination: No Focal numbness/weakness: No Visual deficits/changes: Yes- Floaters (improved/ stable) Confusion/Memory deficits: No  Other issues of note: None  Vitals- BP 126/77 (BP Location: Right Arm, Patient Position: Sitting, Cuff Size: Normal)   Pulse 63   Temp (!) 97.3 F (36.3 C) (Temporal)   Resp 20   Ht 5\' 11"  (1.803 m)   Wt 157 lb 6.4 oz (71.4 kg)   SpO2 98%   BMI 21.95 kg/m   This concludes the interaction.  Ruel Favors, LPN

## 2022-07-25 ENCOUNTER — Other Ambulatory Visit: Payer: Medicare Other

## 2022-07-26 ENCOUNTER — Ambulatory Visit
Admission: RE | Admit: 2022-07-26 | Discharge: 2022-07-26 | Disposition: A | Discharge status: Home / Self Care | Payer: Medicare Other | Source: Ambulatory Visit | Attending: Radiation Oncology | Admitting: Radiation Oncology

## 2022-07-26 DIAGNOSIS — Z981 Arthrodesis status: Secondary | ICD-10-CM | POA: Diagnosis not present

## 2022-07-26 DIAGNOSIS — D1809 Hemangioma of other sites: Secondary | ICD-10-CM

## 2022-07-26 MED ORDER — GADOPICLENOL 0.5 MMOL/ML IV SOLN
7.0000 mL | Freq: Once | INTRAVENOUS | Status: AC | PRN
  Administered 2022-07-26: 7 mL via INTRAVENOUS

## 2022-07-30 ENCOUNTER — Inpatient Hospital Stay: Payer: Medicare Other | Attending: Radiation Oncology

## 2022-08-07 ENCOUNTER — Encounter: Payer: Self-pay | Admitting: Radiation Oncology

## 2022-08-07 ENCOUNTER — Ambulatory Visit
Admission: RE | Admit: 2022-08-07 | Discharge: 2022-08-07 | Disposition: A | Discharge status: Home / Self Care | Payer: Medicare Other | Source: Ambulatory Visit | Attending: Radiation Oncology | Admitting: Radiation Oncology

## 2022-08-07 ENCOUNTER — Other Ambulatory Visit: Payer: Self-pay

## 2022-08-07 VITALS — BP 126/77 | HR 63 | Temp 97.3°F | Resp 20 | Ht 71.0 in | Wt 157.4 lb

## 2022-08-07 DIAGNOSIS — Z923 Personal history of irradiation: Secondary | ICD-10-CM | POA: Diagnosis not present

## 2022-08-07 DIAGNOSIS — Z79899 Other long term (current) drug therapy: Secondary | ICD-10-CM | POA: Diagnosis not present

## 2022-08-07 DIAGNOSIS — L570 Actinic keratosis: Secondary | ICD-10-CM | POA: Diagnosis not present

## 2022-08-07 DIAGNOSIS — Z86018 Personal history of other benign neoplasm: Secondary | ICD-10-CM | POA: Diagnosis not present

## 2022-08-07 DIAGNOSIS — D2272 Melanocytic nevi of left lower limb, including hip: Secondary | ICD-10-CM | POA: Diagnosis not present

## 2022-08-07 DIAGNOSIS — M5136 Other intervertebral disc degeneration, lumbar region: Secondary | ICD-10-CM | POA: Insufficient documentation

## 2022-08-07 DIAGNOSIS — D485 Neoplasm of uncertain behavior of skin: Secondary | ICD-10-CM | POA: Diagnosis not present

## 2022-08-07 DIAGNOSIS — Z85828 Personal history of other malignant neoplasm of skin: Secondary | ICD-10-CM | POA: Diagnosis not present

## 2022-08-07 DIAGNOSIS — L308 Other specified dermatitis: Secondary | ICD-10-CM | POA: Diagnosis not present

## 2022-08-07 DIAGNOSIS — M48061 Spinal stenosis, lumbar region without neurogenic claudication: Secondary | ICD-10-CM | POA: Insufficient documentation

## 2022-08-07 DIAGNOSIS — Z7982 Long term (current) use of aspirin: Secondary | ICD-10-CM | POA: Diagnosis not present

## 2022-08-07 DIAGNOSIS — L661 Lichen planopilaris: Secondary | ICD-10-CM | POA: Diagnosis not present

## 2022-08-07 DIAGNOSIS — M5137 Other intervertebral disc degeneration, lumbosacral region: Secondary | ICD-10-CM | POA: Insufficient documentation

## 2022-08-07 DIAGNOSIS — D225 Melanocytic nevi of trunk: Secondary | ICD-10-CM | POA: Diagnosis not present

## 2022-08-07 DIAGNOSIS — D1809 Hemangioma of other sites: Secondary | ICD-10-CM | POA: Diagnosis not present

## 2022-08-07 DIAGNOSIS — L578 Other skin changes due to chronic exposure to nonionizing radiation: Secondary | ICD-10-CM | POA: Diagnosis not present

## 2022-08-07 DIAGNOSIS — L219 Seborrheic dermatitis, unspecified: Secondary | ICD-10-CM | POA: Diagnosis not present

## 2022-08-07 DIAGNOSIS — L821 Other seborrheic keratosis: Secondary | ICD-10-CM | POA: Diagnosis not present

## 2022-08-07 NOTE — Progress Notes (Signed)
Radiation Oncology         [phone] ________________________________  Name: Miguel Wolfe MRN: [id number]  Date: 08/07/2022  DOB: 10/31/1945  Follow-Up Visit Note  Outpatient  CC: [name], MD  [name], MD  Diagnosis:     ICD-10-CM   1. 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     2. Hemangioma of bone  D18.09     3. Hemangioma of other sites  D18.09      First Treatment Date: 2022-01-15 - Last Treatment Date: 2022-01-24   Plan Name: Spine_L1_SRT Site: Lumbar Spine Technique: SBRT/SRT-IMRT Mode: Photon Dose Per Fraction: 6 Gy Prescribed Dose (Delivered / Prescribed): 30 Gy / 30 Gy Prescribed Fxs (Delivered / Prescribed): 5 / 5  tage assigned - Unsigned   CHIEF COMPLAINT: Here for follow-up and surveillance of hemangioma of the spine  Interval Since Last Radiation:  1 month and 3 days  Narrative:  The patient returns today for routine follow-up after completing SRS treatment to L1 and to review his most recent MRI.   MRI of the lumbar spine on 07/26/22 showed Pedicls screws at T12 and L2; an unchanged pathologic fracture of the L1 vertebral body related to the large intraosseous hemangioma which has increased since 2023, bony retropulsion which has decreased since the prior MRI; and an unchanged adjacent right paraspinal lesion.   Recent neurologic symptoms, if any:  Seizures: No Headaches: No Nausea: No Dizziness/ataxia: No Difficulty with hand coordination: No Focal numbness/weakness: No Visual deficits/changes: Yes- Floaters (improved/ stable) Confusion/Memory deficits: No   Other issues of note: Anxiety  ALLERGIES:  is allergic to propranolol, gemfibrozil, niaspan [niacin er], and simvastatin.  Meds: Current Outpatient Medications  Medication Sig Dispense Refill   acetaminophen (TYLENOL) 500 MG tablet Take 500 mg by mouth every 6 (six) hours as needed for moderate pain (Back pain).     amLODipine (NORVASC) 10 MG tablet Take 10 mg by mouth daily.      aspirin EC 81 MG tablet Take 81 mg by mouth at bedtime.     carvedilol (COREG) 6.25 MG tablet TAKE 1 TABLET(6.25 MG) BY MOUTH TWICE DAILY WITH A MEAL 180 tablet 3   Coenzyme Q10 (COQ10) 100 MG CAPS Take 100 mg by mouth daily.     ezetimibe (ZETIA) 10 MG tablet TAKE 1 TABLET(10 MG) BY MOUTH DAILY 90 tablet 3   famotidine (PEPCID) 20 MG tablet Take 20 mg by mouth daily.     fluticasone (FLONASE) 50 MCG/ACT nasal spray Place 1 spray into both nostrils daily as needed for allergies or rhinitis (Spring and Fall).     HYDROcodone-acetaminophen (NORCO/VICODIN) 5-325 MG tablet Take 1 tablet by mouth every 6 (six) hours as needed for moderate pain. 20 tablet 0   ibuprofen (ADVIL) 200 MG tablet Take 200 mg by mouth every 6 (six) hours as needed for moderate pain.     levothyroxine (SYNTHROID) 150 MCG tablet Take 150 mcg by mouth daily before breakfast.     LORazepam (ATIVAN) 0.5 MG tablet Take 1 tablet (0.5 mg total) by mouth daily as needed for anxiety (ONLY FOR MRIs). 10 tablet 0   losartan (COZAAR) 100 MG tablet Take 100 mg by mouth daily.     nitroGLYCERIN (NITROSTAT) 0.4 MG SL tablet PLACE 1 TABLET UNDER THE TONGUE EVERY 5 MINUTES AS NEEDED FOR CHEST PAIN 25 tablet 11   Omega-3 Fatty Acids (FISH OIL) 1000 MG CAPS Take 2,000 mg by mouth in the morning and at bedtime.  pantoprazole (PROTONIX) 40 MG tablet Take 40 mg by mouth every evening.     pravastatin (PRAVACHOL) 80 MG tablet TAKE 1 TABLET(80 MG) BY MOUTH DAILY 90 tablet 1   zolpidem (AMBIEN) 10 MG tablet Take 10 mg by mouth at bedtime.  0   No current facility-administered medications for this encounter.    Physical Findings:  The patient is in no acute distress. Patient is alert and oriented.  height is 5\' 11"  (1.803 m) and weight is 157 lb 6.4 oz (71.4 kg). His temporal temperature is 97.3 F (36.3 C) (abnormal). His blood pressure is 126/77 and his pulse is 63. His respiration is 20 and oxygen saturation is 98%. .   In general this is  a well appearing male in no acute distress. He's alert and oriented x4 and appropriate throughout the examination. Cardiopulmonary assessment is negative for acute distress and he exhibits normal effort. Ambulating on his own.       Lab Findings: Lab Results  Component Value Date   WBC 4.6 05/03/2022   HGB 14.7 05/03/2022   HCT 43.1 05/03/2022   MCV 91.1 05/03/2022   PLT 157 05/03/2022    Radiographic Findings: MR Lumbar Spine W Wo Contrast  Result Date: 08/03/2022 CLINICAL DATA:  Follow-up L1 hemangioma status post fusion. EXAM: MRI LUMBAR SPINE WITHOUT AND WITH CONTRAST TECHNIQUE: Multiplanar and multiecho pulse sequences of the lumbar spine were obtained without and with intravenous contrast. CONTRAST:  7 cc Vueway COMPARISON:  Lumbar spine MRI most recently 01/04/2022, and CT 04/18/2022 FINDINGS: Segmentation: Standard; the lowest formed disc space is designated L5-S1. Alignment: There is trace retrolisthesis of L1 on L2. Alignment is otherwise normal. Vertebrae: Postsurgical changes reflecting posterior instrumented fusion from T12 through L2 with pedicle screws at T12 and L2 are noted. Previously seen pedicle screws at T11 and L3 has been removed. Again seen is the T1 hypointense, stir hyperintense, enhancing lesion in the L1 vertebral body consistent with the biopsy-proven hemangioma. The extent of the lesion appears similar to the MRI from 01/04/2022, again occupying most of the vertebral body and approaching both pedicles, left more than right. The pathologic compression fracture of the L1 vertebral body with up to approximately 50% loss of vertebral body height is unchanged since the prior CT, but is progressed since the MRI from September 2023. Bony retropulsion/posterior soft tissue extent of the hemangioma appears decreased since the prior MRI from 2023 (compare image 4-11 on the current study to image 3-8 on the prior). The separate right paraspinal lesion which is isointense to the  hemangioma measuring 2.5 cm x 1.5 cm in the axial plane is not significantly changed compared to the prior MRI or compared to the more remote study from 2022 (8-20). The other vertebral body heights are preserved, with no new fracture. A few smaller hemangiomas in the L3 vertebral body are unchanged. Mild degenerative endplate signal abnormality without edema along the L5-S1 disc space are unchanged. Conus medullaris and cauda equina: Conus extends to the L1-L2 level. Conus and cauda equina appear normal. Paraspinal and other soft tissues: As above, the right paraspinal lesion at L1 is unchanged. The paraspinal soft tissues are otherwise unremarkable. Disc levels: Spinal canal stenosis at L1 is improved. Disc degeneration at L5-S1 is unchanged. There is no new or progressive degenerative change in the lumbar spine. There is no high-grade spinal canal or neural foraminal stenosis. IMPRESSION: 1. Since the prior MRI from 01/04/2022, pedicle screws at T11 and L3 have been removed with  unchanged pedicle screws at T12 and L2. 2. Unchanged pathologic fracture of the L1 vertebral body related to the large intraosseous hemangioma compared to the most recent CT from 04/18/2022; however the compression deformity is increased since more remote studies such as the MRI from September 2023. 3. Bony retropulsion/soft tissue along the posterior endplate is decreased since the prior MRI. 4. Unchanged adjacent right paraspinal lesion. Electronically Signed   By: Lesia Hausen M.D.   On: 08/03/2022 12:33    Impression/Plan:  Hemangioma at the L1 vertebral body; s/p SRS on 01/24/22.   Recent MRI shows stabilization of hemangioma which seems to be regressing from the spinal canal.  I personally reviewed his serial images with him and his family.  Patient states he has also noticed some pain relief since receiving radiation and removal of screws that came loose in his spine. He has healed very well from the radiation. We are very  pleased with his progress at this time.   We will continue with 6 month follow-ups at this time. Follow-up MRI in 6 months. Patient will see Dr. Conchita Paris to review his next scan. We will then see the patient back in one year. I really appreciated taking part in this patient's care. He knows he can call back at anytime with questions or concerns before his next visit.   Patient has also been noticing an increase in generalized anxiety and was seeking help for this today. We offered counseling as a first line treatment option. He is interested in trying medication, so we will defer this to his PCP. He knows to call us back if he does change his mind on counseling and we are happy to get him connected with social work.    On date of service, in total, I spent 20 minutes on this encounter. Patient was seen in person.  _____________________________________   Joyice Faster, PA    Lonie Peak, MD

## 2022-08-08 ENCOUNTER — Other Ambulatory Visit: Payer: Self-pay | Admitting: Radiation Therapy

## 2022-08-08 DIAGNOSIS — C7951 Secondary malignant neoplasm of bone: Secondary | ICD-10-CM

## 2022-08-13 DIAGNOSIS — M5459 Other low back pain: Secondary | ICD-10-CM | POA: Diagnosis not present

## 2022-08-13 DIAGNOSIS — M8468XD Pathological fracture in other disease, other site, subsequent encounter for fracture with routine healing: Secondary | ICD-10-CM | POA: Diagnosis not present

## 2022-08-15 DIAGNOSIS — Z125 Encounter for screening for malignant neoplasm of prostate: Secondary | ICD-10-CM | POA: Diagnosis not present

## 2022-08-15 DIAGNOSIS — Z85850 Personal history of malignant neoplasm of thyroid: Secondary | ICD-10-CM | POA: Diagnosis not present

## 2022-08-15 DIAGNOSIS — R7303 Prediabetes: Secondary | ICD-10-CM | POA: Diagnosis not present

## 2022-08-15 DIAGNOSIS — E782 Mixed hyperlipidemia: Secondary | ICD-10-CM | POA: Diagnosis not present

## 2022-08-15 DIAGNOSIS — E890 Postprocedural hypothyroidism: Secondary | ICD-10-CM | POA: Diagnosis not present

## 2022-08-20 DIAGNOSIS — M8468XD Pathological fracture in other disease, other site, subsequent encounter for fracture with routine healing: Secondary | ICD-10-CM | POA: Diagnosis not present

## 2022-08-20 DIAGNOSIS — M5459 Other low back pain: Secondary | ICD-10-CM | POA: Diagnosis not present

## 2022-08-23 DIAGNOSIS — Z1331 Encounter for screening for depression: Secondary | ICD-10-CM | POA: Diagnosis not present

## 2022-08-23 DIAGNOSIS — E890 Postprocedural hypothyroidism: Secondary | ICD-10-CM | POA: Diagnosis not present

## 2022-08-23 DIAGNOSIS — I10 Essential (primary) hypertension: Secondary | ICD-10-CM | POA: Diagnosis not present

## 2022-08-23 DIAGNOSIS — K219 Gastro-esophageal reflux disease without esophagitis: Secondary | ICD-10-CM | POA: Diagnosis not present

## 2022-08-23 DIAGNOSIS — M67449 Ganglion, unspecified hand: Secondary | ICD-10-CM | POA: Diagnosis not present

## 2022-08-23 DIAGNOSIS — E782 Mixed hyperlipidemia: Secondary | ICD-10-CM | POA: Diagnosis not present

## 2022-08-23 DIAGNOSIS — Z0000 Encounter for general adult medical examination without abnormal findings: Secondary | ICD-10-CM | POA: Diagnosis not present

## 2022-08-23 DIAGNOSIS — Z23 Encounter for immunization: Secondary | ICD-10-CM | POA: Diagnosis not present

## 2022-08-23 DIAGNOSIS — G4700 Insomnia, unspecified: Secondary | ICD-10-CM | POA: Diagnosis not present

## 2022-08-23 DIAGNOSIS — R7303 Prediabetes: Secondary | ICD-10-CM | POA: Diagnosis not present

## 2022-08-23 DIAGNOSIS — I25111 Atherosclerotic heart disease of native coronary artery with angina pectoris with documented spasm: Secondary | ICD-10-CM | POA: Diagnosis not present

## 2022-08-23 DIAGNOSIS — D696 Thrombocytopenia, unspecified: Secondary | ICD-10-CM | POA: Diagnosis not present

## 2022-08-27 DIAGNOSIS — M8468XD Pathological fracture in other disease, other site, subsequent encounter for fracture with routine healing: Secondary | ICD-10-CM | POA: Diagnosis not present

## 2022-08-27 DIAGNOSIS — M5459 Other low back pain: Secondary | ICD-10-CM | POA: Diagnosis not present

## 2022-09-05 ENCOUNTER — Encounter: Payer: Self-pay | Admitting: Cardiology

## 2022-09-05 ENCOUNTER — Ambulatory Visit: Payer: Medicare Other | Admitting: Cardiology

## 2022-09-05 VITALS — BP 120/60 | HR 67 | Ht 71.0 in | Wt 157.0 lb

## 2022-09-05 DIAGNOSIS — I351 Nonrheumatic aortic (valve) insufficiency: Secondary | ICD-10-CM | POA: Diagnosis not present

## 2022-09-05 DIAGNOSIS — I10 Essential (primary) hypertension: Secondary | ICD-10-CM | POA: Diagnosis not present

## 2022-09-05 DIAGNOSIS — I2510 Atherosclerotic heart disease of native coronary artery without angina pectoris: Secondary | ICD-10-CM | POA: Insufficient documentation

## 2022-09-05 MED ORDER — PROPRANOLOL HCL 10 MG PO TABS: 10.0000 mg | ORAL_TABLET | Freq: Two times a day (BID) | ORAL | 3 refills | Status: DC

## 2022-09-05 NOTE — Patient Instructions (Signed)
Medication Instructions:  Please discontinue your Carvedilol and start Propranolol 10 mg twice a day. Continue all other medications as listed,  *If you need a refill on your cardiac medications before your next appointment, please call your pharmacy*  Follow-Up: At [facility], you and your health needs are our priority.  As part of our continuing mission to provide you with exceptional heart care, we have created designated Provider Care Teams.  These Care Teams include your primary Cardiologist (physician) and Advanced Practice Providers (APPs -  Physician Assistants and Nurse Practitioners) who all work together to provide you with the care you need, when you need it.  We recommend signing up for the patient portal called "MyChart".  Sign up information is provided on this After Visit Summary.  MyChart is used to connect with patients for Virtual Visits (Telemedicine).  Patients are able to view lab/test results, encounter notes, upcoming appointments, etc.  Non-urgent messages can be sent to your provider as well.   To learn more about what you can do with MyChart, go to [email or web address].    Your next appointment:   6 month(s)  Provider:   Eligha Bridegroom, NP

## 2022-09-05 NOTE — Progress Notes (Signed)
Cardiology Office Note:  .   Date:  09/05/2022  ID:  Miguel Wolfe, DOB 1945-12-09, MRN [id number] PCP: [name], MD  [facility] Providers Cardiologist:  Donato Schultz, MD    History of Present Illness: .   Miguel Wolfe is a 77 y.o. male Discussed the use of AI scribe software for clinical note transcription with the patient, who gave verbal consent to proceed.  History of Present Illness   The patient, with a history of coronary artery disease, aortic regurgitation, and a left upper lobectomy, presents for a routine follow-up. The patient reports occasional chest discomfort, which he describes as a feeling rather than pain, located in the mid-sternal region. The patient attributes this discomfort to his previous lung surgery. The patient also reports fatigue, particularly in the afternoons, but acknowledges that this could be due to his age. Despite these symptoms, the patient maintains an active lifestyle, including daily walks and woodworking. The patient also reports a history of hernia and back surgery, with ongoing recovery from the latter. The patient is currently on medication for high cholesterol and blood pressure, which he tolerates well.       Cardiac catheterization 02/2016-DES to RCA   Thyroid cancer, lung cancer status post left upper lobectomy in 2020   Ultrasound of aorta 2021 no aneurysm   Echo 2024: EF 60 to 65%. Grade I DD. Mild to moderate AR. Aortic valve sclerosis without stenosis. Mild dilatation of ascending aorta, 43 mm. 41 mm 2022.    Had some statin intolerance with both atorvastatin and rosuvastatin.  Continues to be on Zetia.    Studies Reviewed: Marland Kitchen        LABS LDL: 51 (08/15/2022) HDL: 56 (08/15/2022) Creatinine: 0.6 (08/15/2022) Hemoglobin A1c: 5.6 (08/15/2022) TSH: 0.1 (08/15/2022)  DIAGNOSTIC Echocardiogram: Mild to moderate aortic regurgitation, ascending aorta 43 mm, normal pump function (05/2022) Risk Assessment/Calculations:             Physical Exam:   VS:  BP 120/60   Pulse 67   Ht 5\' 11"  (1.803 m)   Wt 157 lb (71.2 kg)   SpO2 99%   BMI 21.90 kg/m    Wt Readings from Last 3 Encounters:  09/05/22 157 lb (71.2 kg)  08/07/22 157 lb 6.4 oz (71.4 kg)  05/10/22 156 lb (70.8 kg)    GEN: Well nourished, well developed in no acute distress NECK: No JVD; No carotid bruits CARDIAC: RRR, soft systolic murmur, no rubs, gallops RESPIRATORY:  Clear to auscultation without rales, wheezing or rhonchi  ABDOMEN: Soft, non-tender, non-distended EXTREMITIES:  No edema; No deformity   ASSESSMENT AND PLAN: .    Assessment and Plan    Coronary Artery Disease Stable with stent placed in mid right coronary artery in 2018. No new symptoms of chest pain. -Continue current management.  Aortic Dilation Mild dilation of the aorta (43mm, increased from 41mm). Mild to moderate aortic regurgitation. No symptoms suggestive of worsening condition. -Repeat echocardiogram in 1 year to monitor aortic size and valve function.  Hypertension Stable on current medication regimen. Patient wishes to switch from Carvedilol to Propranolol due to benefit on anxiety symptoms. -Discontinue Carvedilol 6.25mg  twice daily. -Start Propranolol 10mg  twice daily. -Monitor blood pressure to ensure stability on new regimen.  Hyperlipidemia Well controlled on current regimen of Zetia 10mg  daily and Pravastatin 80mg  daily. Recent LDL of 51. -Continue current management.  Follow-up Plan for 25-month follow-up with PA, then yearly follow-up with cardiologist.  Signed, Donato Schultz, MD

## 2022-09-12 DIAGNOSIS — M8468XD Pathological fracture in other disease, other site, subsequent encounter for fracture with routine healing: Secondary | ICD-10-CM | POA: Diagnosis not present

## 2022-09-12 DIAGNOSIS — M5459 Other low back pain: Secondary | ICD-10-CM | POA: Diagnosis not present

## 2022-09-18 DIAGNOSIS — M8468XD Pathological fracture in other disease, other site, subsequent encounter for fracture with routine healing: Secondary | ICD-10-CM | POA: Diagnosis not present

## 2022-09-18 DIAGNOSIS — M5459 Other low back pain: Secondary | ICD-10-CM | POA: Diagnosis not present

## 2022-09-24 DIAGNOSIS — M8468XD Pathological fracture in other disease, other site, subsequent encounter for fracture with routine healing: Secondary | ICD-10-CM | POA: Diagnosis not present

## 2022-09-24 DIAGNOSIS — M5459 Other low back pain: Secondary | ICD-10-CM | POA: Diagnosis not present

## 2022-09-27 ENCOUNTER — Telehealth: Payer: Self-pay | Admitting: Radiation Therapy

## 2022-09-27 NOTE — Telephone Encounter (Signed)
Left voicemail about Jan 2025 spine MRI and follow-up with Dr. Basilio Cairo. My contact information was included with a request to call back if he has questions or concerns.   Jalene Mullet R.T.(R)(T) Radiation Special Procedures Navigator

## 2022-10-09 DIAGNOSIS — E890 Postprocedural hypothyroidism: Secondary | ICD-10-CM | POA: Diagnosis not present

## 2022-10-10 DIAGNOSIS — M5459 Other low back pain: Secondary | ICD-10-CM | POA: Diagnosis not present

## 2022-10-10 DIAGNOSIS — M8468XD Pathological fracture in other disease, other site, subsequent encounter for fracture with routine healing: Secondary | ICD-10-CM | POA: Diagnosis not present

## 2022-10-13 ENCOUNTER — Other Ambulatory Visit: Payer: Self-pay | Admitting: Cardiology

## 2022-10-13 DIAGNOSIS — E7800 Pure hypercholesterolemia, unspecified: Secondary | ICD-10-CM

## 2022-10-15 DIAGNOSIS — Z23 Encounter for immunization: Secondary | ICD-10-CM | POA: Diagnosis not present

## 2022-10-15 DIAGNOSIS — M8468XD Pathological fracture in other disease, other site, subsequent encounter for fracture with routine healing: Secondary | ICD-10-CM | POA: Diagnosis not present

## 2022-10-15 DIAGNOSIS — M5459 Other low back pain: Secondary | ICD-10-CM | POA: Diagnosis not present

## 2022-10-16 ENCOUNTER — Other Ambulatory Visit: Payer: Self-pay

## 2022-10-16 DIAGNOSIS — E7800 Pure hypercholesterolemia, unspecified: Secondary | ICD-10-CM

## 2022-10-16 MED ORDER — EZETIMIBE 10 MG PO TABS
10.0000 mg | ORAL_TABLET | Freq: Every day | ORAL | 3 refills | Status: DC
Start: 2022-10-16 — End: 2023-09-24

## 2022-10-18 ENCOUNTER — Encounter: Payer: Self-pay | Admitting: Cardiology

## 2022-10-18 MED ORDER — PROPRANOLOL HCL 20 MG PO TABS: 20.0000 mg | ORAL_TABLET | Freq: Two times a day (BID) | ORAL | 3 refills | Status: DC

## 2022-10-22 DIAGNOSIS — M8468XD Pathological fracture in other disease, other site, subsequent encounter for fracture with routine healing: Secondary | ICD-10-CM | POA: Diagnosis not present

## 2022-10-22 DIAGNOSIS — M5459 Other low back pain: Secondary | ICD-10-CM | POA: Diagnosis not present

## 2022-10-24 DIAGNOSIS — Z23 Encounter for immunization: Secondary | ICD-10-CM | POA: Diagnosis not present

## 2022-11-05 DIAGNOSIS — M5459 Other low back pain: Secondary | ICD-10-CM | POA: Diagnosis not present

## 2022-11-05 DIAGNOSIS — M8468XD Pathological fracture in other disease, other site, subsequent encounter for fracture with routine healing: Secondary | ICD-10-CM | POA: Diagnosis not present

## 2022-11-07 DIAGNOSIS — L910 Hypertrophic scar: Secondary | ICD-10-CM | POA: Diagnosis not present

## 2022-11-07 DIAGNOSIS — L661 Lichen planopilaris, unspecified: Secondary | ICD-10-CM | POA: Diagnosis not present

## 2022-11-07 DIAGNOSIS — L570 Actinic keratosis: Secondary | ICD-10-CM | POA: Diagnosis not present

## 2022-11-12 DIAGNOSIS — M5459 Other low back pain: Secondary | ICD-10-CM | POA: Diagnosis not present

## 2022-11-12 DIAGNOSIS — M8468XD Pathological fracture in other disease, other site, subsequent encounter for fracture with routine healing: Secondary | ICD-10-CM | POA: Diagnosis not present

## 2022-12-03 DIAGNOSIS — E890 Postprocedural hypothyroidism: Secondary | ICD-10-CM | POA: Diagnosis not present

## 2022-12-10 DIAGNOSIS — M8468XD Pathological fracture in other disease, other site, subsequent encounter for fracture with routine healing: Secondary | ICD-10-CM | POA: Diagnosis not present

## 2022-12-10 DIAGNOSIS — M5459 Other low back pain: Secondary | ICD-10-CM | POA: Diagnosis not present

## 2023-01-07 ENCOUNTER — Other Ambulatory Visit: Payer: Self-pay | Admitting: Cardiology

## 2023-01-15 ENCOUNTER — Ambulatory Visit (HOSPITAL_COMMUNITY)
Admission: RE | Admit: 2023-01-15 | Discharge: 2023-01-15 | Disposition: A | Discharge status: Home / Self Care | Payer: Medicare Other | Source: Ambulatory Visit | Attending: Internal Medicine | Admitting: Internal Medicine

## 2023-01-15 DIAGNOSIS — I700 Atherosclerosis of aorta: Secondary | ICD-10-CM | POA: Diagnosis not present

## 2023-01-15 DIAGNOSIS — C3492 Malignant neoplasm of unspecified part of left bronchus or lung: Secondary | ICD-10-CM | POA: Diagnosis not present

## 2023-01-15 DIAGNOSIS — C3490 Malignant neoplasm of unspecified part of unspecified bronchus or lung: Secondary | ICD-10-CM | POA: Insufficient documentation

## 2023-01-15 MED ORDER — IOHEXOL 300 MG/ML  SOLN
75.0000 mL | Freq: Once | INTRAMUSCULAR | Status: AC | PRN
  Administered 2023-01-15: 75 mL via INTRAVENOUS

## 2023-01-18 ENCOUNTER — Ambulatory Visit (INDEPENDENT_AMBULATORY_CARE_PROVIDER_SITE_OTHER): Payer: Medicare Other | Admitting: Thoracic Surgery (Cardiothoracic Vascular Surgery)

## 2023-01-18 ENCOUNTER — Encounter: Payer: Self-pay | Admitting: Thoracic Surgery (Cardiothoracic Vascular Surgery)

## 2023-01-18 ENCOUNTER — Ambulatory Visit: Payer: Medicare Other | Admitting: Thoracic Surgery (Cardiothoracic Vascular Surgery)

## 2023-01-18 VITALS — BP 155/79 | HR 64 | Resp 20 | Wt 165.0 lb

## 2023-01-18 DIAGNOSIS — C3412 Malignant neoplasm of upper lobe, left bronchus or lung: Secondary | ICD-10-CM | POA: Diagnosis not present

## 2023-01-18 NOTE — Progress Notes (Signed)
.  [facility]     [address]       [place] [address]             [phone]                                        EYAL GREENHAW [facility] Medical Record #[id number] Date of Birth: [birth year]   Referring: Morgan Drivers, MD Primary Care: Seabron Lenis, MD Primary Cardiologist:Mark Jeffrie, MD   Reason for visit:   follow-up   History of Present Illness:     Mr. Spittler is a former patient of Dr. 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  He presents today in follow-up.   Physical Exam: Vitals:   01/18/23 1047  BP: (!) 155/79  Pulse: 64  Resp: 20  SpO2: 97%      Alert NAD Easy work of breathing Abdomen, ND In no peripheral edema     Diagnostic Studies & Laboratory data: CT chest Official read is pending.  On my review, right upper lobe scarring appears stable.  There does not appear to be any new nodules on the left side.   Assessment / Plan:   78 year old male status post left upper lobectomy, and history of right upper lobe groundglass opacity which is essentially unchanged.  He will continue to follow-up with lung cancer screening.   Sherrian Nunnelley MALVA Rayas

## 2023-01-22 ENCOUNTER — Other Ambulatory Visit: Payer: Self-pay | Admitting: Radiation Oncology

## 2023-01-22 ENCOUNTER — Encounter: Payer: Self-pay | Admitting: Medical Oncology

## 2023-01-22 ENCOUNTER — Inpatient Hospital Stay: Payer: Medicare Other | Attending: Internal Medicine | Admitting: Internal Medicine

## 2023-01-22 ENCOUNTER — Ambulatory Visit (INDEPENDENT_AMBULATORY_CARE_PROVIDER_SITE_OTHER): Payer: Medicare Other | Admitting: Thoracic Surgery (Cardiothoracic Vascular Surgery)

## 2023-01-22 VITALS — BP 129/79 | HR 67 | Temp 97.7°F | Resp 17 | Ht 71.0 in | Wt 165.5 lb

## 2023-01-22 DIAGNOSIS — R918 Other nonspecific abnormal finding of lung field: Secondary | ICD-10-CM | POA: Diagnosis not present

## 2023-01-22 DIAGNOSIS — Z902 Acquired absence of lung [part of]: Secondary | ICD-10-CM | POA: Insufficient documentation

## 2023-01-22 DIAGNOSIS — C3412 Malignant neoplasm of upper lobe, left bronchus or lung: Secondary | ICD-10-CM | POA: Diagnosis not present

## 2023-01-22 DIAGNOSIS — C7951 Secondary malignant neoplasm of bone: Secondary | ICD-10-CM

## 2023-01-22 DIAGNOSIS — C3490 Malignant neoplasm of unspecified part of unspecified bronchus or lung: Secondary | ICD-10-CM | POA: Diagnosis not present

## 2023-01-22 DIAGNOSIS — Z800 Family history of malignant neoplasm of digestive organs: Secondary | ICD-10-CM | POA: Diagnosis not present

## 2023-01-22 MED ORDER — LORAZEPAM 0.5 MG PO TABS: 0.5000 mg | ORAL_TABLET | Freq: Every day | ORAL | 0 refills | Status: DC | PRN

## 2023-01-22 NOTE — Progress Notes (Signed)
 [facility] [phone]   [phone]  OFFICE PROGRESS NOTE  Miguel Lenis, MD [address] [place] [address]  DIAGNOSIS: Stage IA (T1c, N0, M0) non-small cell lung cancer, adenocarcinoma with no actionable mutation and negative PDL 1 expression diagnosed in June 2020.   PRIOR THERAPY: Status post left upper lobectomy with lymph node dissection on June 24, 2018.  CURRENT THERAPY: Observation.  INTERVAL HISTORY: Miguel Wolfe 78 y.o. male returns to the clinic today for follow-up visit accompanied by his wife.Discussed the use of AI scribe software for clinical note transcription with the patient, who gave verbal consent to proceed.  History of Present Illness   The patient, a 78 year old with a history of stage 1A non-small cell lung adenocarcinoma, underwent a left upper lobectomy in June 2020. Since then, he has been under surveillance with annual scans. The patient reports a new onset of intermittent pain on the left side, in the same area as the surgical scar, starting in November. The pain is not severe and does not occur daily.  The most recent scan revealed a subsolid nodule in the apical part of the right lung, measuring 2.8 by 1.1 cm. This nodule has not changed in size over the past year and is suspected to be a slowly growing lung cancer or scar tissue. He has no other new symptoms or complaints.         MEDICAL HISTORY: Past Medical History:  Diagnosis Date   Aortic insufficiency    Arthritis    CAD (coronary artery disease)    One Stent placed   Colon polyps    Dyspnea    With exertion related to 1/2 lung removed   Family history of adverse reaction to anesthesia    Mom vomits and Dad went nuts   Family history of colon cancer    Family history of stomach cancer    Fatigue    GERD (gastroesophageal reflux disease)    Hemorrhoids    History of echocardiogram    Echo 02/08/16: Mild LVH, EF 55-60, no  RWMA, Gr 1 DD, trivial AI, mild LAE   Hyperlipidemia    Hypertension    Lung cancer (HCC)    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   PONV (postoperative nausea and vomiting)    Pre-diabetes    Pulmonary nodules    Thyroid  cancer (HCC)    thyroid  removed    ALLERGIES:  is allergic to propranolol , gemfibrozil, niaspan [niacin er (antihyperlipidemic)], and simvastatin.  MEDICATIONS:  Current Outpatient Medications  Medication Sig Dispense Refill   acetaminophen  (TYLENOL ) 500 MG tablet Take 500 mg by mouth every 6 (six) hours as needed for moderate pain (Back pain).     amLODipine  (NORVASC ) 10 MG tablet Take 10 mg by mouth daily.     aspirin  EC 81 MG tablet Take 81 mg by mouth at bedtime.     Coenzyme Q10 (COQ10) 100 MG CAPS Take 100 mg by mouth daily.     ezetimibe  (ZETIA ) 10 MG tablet Take 1 tablet (10 mg total) by mouth daily. 90 tablet 3   famotidine  (PEPCID ) 20 MG tablet Take 20 mg by mouth daily.     fluticasone  (FLONASE ) 50 MCG/ACT nasal spray Place 1 spray into both nostrils daily as needed for allergies or rhinitis (Spring and Fall).     HYDROcodone -acetaminophen  (NORCO/VICODIN) 5-325 MG tablet Take 1 tablet by mouth every 6 (six) hours as needed for moderate pain. 20 tablet  0   ibuprofen  (ADVIL ) 200 MG tablet Take 200 mg by mouth every 6 (six) hours as needed for moderate pain.     levothyroxine  (SYNTHROID ) 150 MCG tablet Take 125 mcg by mouth daily before breakfast.     LORazepam  (ATIVAN ) 0.5 MG tablet Take 1 tablet (0.5 mg total) by mouth daily as needed for anxiety (ONLY FOR MRIs). 10 tablet 0   losartan  (COZAAR ) 100 MG tablet Take 100 mg by mouth daily.     nitroGLYCERIN  (NITROSTAT ) 0.4 MG SL tablet PLACE 1 TABLET UNDER THE TONGUE EVERY 5 MINUTES AS NEEDED FOR CHEST PAIN 25 tablet 11   Omega-3 Fatty Acids (FISH OIL) 1000 MG CAPS Take 2,000 mg by mouth in the morning and at bedtime.     pantoprazole  (PROTONIX ) 40 MG tablet Take 40 mg by mouth every evening.     pravastatin  (PRAVACHOL ) 80 MG tablet  TAKE 1 TABLET(80 MG) BY MOUTH DAILY 90 tablet 2   propranolol  (INDERAL ) 20 MG tablet Take 1 tablet (20 mg total) by mouth 2 (two) times daily. 180 tablet 3   zolpidem  (AMBIEN ) 10 MG tablet Take 10 mg by mouth at bedtime.  0   No current facility-administered medications for this visit.    SURGICAL HISTORY:  Past Surgical History:  Procedure Laterality Date   BACK SURGERY     COLONOSCOPY WITH ESOPHAGOGASTRODUODENOSCOPY (EGD)  2008   CORONARY STENT INTERVENTION N/A 02/20/2016   Procedure: Coronary Stent Intervention;  Surgeon: Lonni JONETTA Cash, MD;  Location: [facility];  Service: Cardiovascular;  Laterality: N/A;   ESOPHAGOGASTRODUODENOSCOPY     HARDWARE REMOVAL Bilateral 05/10/2022   Procedure: REMOVAL OF THORACIC ELEVEN AND LUMBAR THREE PEDICLE SCREWS AND ROD SEGMENTS;  Surgeon: Lanis Pupa, MD;  Location: [facility] OR;  Service: Neurosurgery;  Laterality: Bilateral;  3C   INGUINAL HERNIA REPAIR Right 02/07/2022   Procedure: OPEN RIGHT INGUINAL HERNIA REPAIR WITH MESH;  Surgeon: Kinsinger, Herlene Righter, MD;  Location: [facility];  Service: General;  Laterality: Right;   LEFT HEART CATH AND CORONARY ANGIOGRAPHY N/A 02/20/2016   Procedure: Left Heart Cath and Coronary Angiography;  Surgeon: Lonni JONETTA Cash, MD;  Location: [facility] INVASIVE CV LAB;  Service: Cardiovascular;  Laterality: N/A;   LUNG REMOVAL, PARTIAL Left 06/24/2018   Pt states 1/2 of lung was removed   PILONIDAL CYST EXCISION     REFRACTIVE SURGERY Right    retina tear repair   THYROIDECTOMY N/A 01/17/2018   Procedure: TOTAL THYROIDECTOMY;  Surgeon: Eletha Boas, MD;  Location: [facility];  Service: General;  Laterality: N/A;   VIDEO ASSISTED THORACOSCOPY (VATS)/WEDGE RESECTION Left 06/24/2018   Procedure: VIDEO ASSISTED THORACOSCOPY (VATS)/LUNG RESECTION;  Surgeon: Army Dallas NOVAK, MD;  Location: [facility] OR;  Service: Thoracic;  Laterality: Left;   VIDEO BRONCHOSCOPY WITH ENDOBRONCHIAL NAVIGATION N/A 11/18/2017    Procedure: VIDEO BRONCHOSCOPY WITH ENDOBRONCHIAL NAVIGATION WITH BIOPSIES OF LEFT AND RIGHT UPPER LOBES;  Surgeon: Army Dallas NOVAK, MD;  Location: [facility] OR;  Service: Thoracic;  Laterality: N/A;   VIDEO BRONCHOSCOPY WITH ENDOBRONCHIAL NAVIGATION N/A 06/11/2018   Procedure: VIDEO BRONCHOSCOPY WITH ENDOBRONCHIAL NAVIGATION;  Surgeon: Army Dallas NOVAK, MD;  Location: [facility] OR;  Service: Thoracic;  Laterality: N/A;    REVIEW OF SYSTEMS:  Constitutional: negative Eyes: negative Ears, nose, mouth, throat, and face: negative Respiratory: positive for pleurisy/chest pain Cardiovascular: negative Gastrointestinal: negative Genitourinary:negative Integument/breast: negative Hematologic/lymphatic: negative Musculoskeletal:negative Neurological: negative Behavioral/Psych: negative Endocrine: negative Allergic/Immunologic: negative   PHYSICAL EXAMINATION: General appearance: alert, cooperative, and no  distress Head: Normocephalic, without obvious abnormality, atraumatic Neck: no adenopathy, no JVD, supple, symmetrical, trachea midline, and thyroid  not enlarged, symmetric, no tenderness/mass/nodules Lymph nodes: Cervical, supraclavicular, and axillary nodes normal. Resp: clear to auscultation bilaterally Back: symmetric, no curvature. ROM normal. No CVA tenderness. Cardio: regular rate and rhythm, S1, S2 normal, no murmur, click, rub or gallop GI: soft, non-tender; bowel sounds normal; no masses,  no organomegaly Extremities: extremities normal, atraumatic, no cyanosis or edema Neurologic: Alert and oriented X 3, normal strength and tone. Normal symmetric reflexes. Normal coordination and gait  ECOG PERFORMANCE STATUS: 1 - Symptomatic but completely ambulatory  Blood pressure 129/79, pulse 67, temperature 97.7 F (36.5 C), temperature source Temporal, resp. rate 17, height 5' 11 (1.803 m), weight 165 lb 8 oz (75.1 kg), SpO2 100%.  LABORATORY DATA: Lab Results  Component Value Date   WBC 4.6  05/03/2022   HGB 14.7 05/03/2022   HCT 43.1 05/03/2022   MCV 91.1 05/03/2022   PLT 157 05/03/2022      Chemistry      Component Value Date/Time   NA 140 05/03/2022 0912   NA 143 04/24/2016 0935   K 3.8 05/03/2022 0912   CL 102 05/03/2022 0912   CO2 29 05/03/2022 0912   BUN 12 05/03/2022 0912   BUN 17 04/24/2016 0935   CREATININE 0.70 05/03/2022 0912   CREATININE 0.74 03/01/2022 0842      Component Value Date/Time   CALCIUM  8.9 05/03/2022 0912   ALKPHOS 72 03/01/2022 0842   AST 19 03/01/2022 0842   ALT 12 03/01/2022 0842   BILITOT 0.8 03/01/2022 0842       RADIOGRAPHIC STUDIES: CT Chest W Contrast Result Date: 01/21/2023 CLINICAL DATA:  Non-small cell left lung cancer, for restaging EXAM: CT CHEST WITH CONTRAST TECHNIQUE: Multidetector CT imaging of the chest was performed during intravenous contrast administration. RADIATION DOSE REDUCTION: This exam was performed according to the departmental dose-optimization program which includes automated exposure control, adjustment of the mA and/or kV according to patient size and/or use of iterative reconstruction technique. CONTRAST:  75mL OMNIPAQUE  IOHEXOL  300 MG/ML  SOLN COMPARISON:  CT chest dated 03/01/2022 FINDINGS: Cardiovascular: The heart is normal in size. No pericardial effusion. No evidence of thoracic aortic aneurysm. Atherosclerotic calcifications of the aortic arch. Moderate three-vessel coronary atherosclerosis. Mediastinum/Nodes: No suspicious mediastinal lymphadenopathy. Status post thyroidectomy. Lungs/Pleura: Status post left upper lobectomy. Subsolid right apical opacity measuring 2.8 x 1.1 cm (series 6/image 36), chronic, but with central nodular soft tissue density measuring at least 13 mm (series 6/image 35). Semi invasive adenocarcinoma remains possible. Otherwise, no suspicious pulmonary nodules. No focal consolidation. No pleural effusion or pneumothorax. Upper Abdomen: Visualized upper abdomen is grossly  unremarkable. Musculoskeletal: Suspected vertebral hemangioma at L1, although with stable mild to moderate pathologic fracture. Posterior lumbar fixation at T12-L2. Stable right paraspinal lesion at L1 (series 2/image 161), favoring a chronic neurogenic tumor. IMPRESSION: Status post left upper lobectomy. Subsolid right apical opacity measuring 2.8 x 1.1 cm, chronic. Semi invasive adenocarcinoma remains possible. At a minimum, attention on annual follow-up is suggested. Stable vertebral and paraspinal lesions at L1, as above. No evidence of metastatic disease. Aortic Atherosclerosis (ICD10-I70.0). Electronically Signed   By: Pinkie Pebbles M.D.   On: 01/21/2023 23:58     ASSESSMENT AND PLAN: This is a very pleasant 78 years old white male with stage Ia non-small cell lung cancer, adenocarcinoma status post left upper lobectomy with lymph node dissection under the care of Dr. Army in  June 2020. The patient is currently on observation and he is feeling fine except for intermittent pain on the left side of the chest from the previous surgical scar. He had repeat CT scan of the chest performed recently.  I personally and independently reviewed the scan and discussed the result with the patient and his wife. His scan showed stable subsolid right apical opacity measuring 2.8 x 1.1 cm still suspicious for slowly growing adenocarcinoma.    Suspicious Subsolid Nodule in Right Lung A subsolid nodule in the apical part of the right lung measuring 2.8 by 1.1 cm has remained unchanged over the past year. Differential diagnosis includes slowly growing adenocarcinoma vs. scar tissue. Discussed options include biopsy, PET scan, and continued surveillance. Risks and benefits of each option were discussed, including potential for false negatives in both PET scans and biopsies. Radiation therapy was discussed as a potential treatment, which could reduce lung capacity due to collateral damage to surrounding tissue. -  Order PET scan - Refer to pulmonologist for potential biopsy - Schedule follow-up appointment in three weeks to discuss PET scan results  Stage 1A Non-Small Cell Lung Cancer (Adenocarcinoma) Diagnosed in June 2020 and underwent a left upper lobectomy. Under surveillance with annual scans. Recent scan showed no new issues on the left side. Asymptomatic except for occasional pain at the surgical site, expected due to weather changes. - Continue annual surveillance scans - Monitor for any new symptoms or changes in pain  General Health Maintenance Advised to receive COVID-19, flu, and other vaccines as needed. Recommended caution in public places due to rising infection rates but reassured that he can still go out while taking precautions. - Advise to receive COVID-19, flu, and other necessary vaccines - Recommend caution in public places due to rising infection rates  Follow-up - Schedule follow-up appointment in three weeks - Ensure PET scan is completed at least a week before the follow-up visit.   The patient was advised to call immediately if he has any concerning symptoms in the interval. The patient voices understanding of current disease status and treatment options and is in agreement with the current care plan.  All questions were answered. The patient knows to call the clinic with any problems, questions or concerns. We can certainly see the patient much sooner if necessary.  The total time spent in the appointment was 30 minutes.  Disclaimer: This note was dictated with voice recognition software. Similar sounding words can inadvertently be transcribed and may not be corrected upon review.

## 2023-01-22 NOTE — Progress Notes (Signed)
     [address]       [name] [address]             [phone]       Patient: Home Provider: Office Consent for Telemedicine visit obtained.  Today's visit was completed via a real-time telehealth (see specific modality noted below). The patient/authorized person provided oral consent at the time of the visit to engage in a telemedicine encounter with the present provider at [facility]. The patient/authorized person was informed of the potential benefits, limitations, and risks of telemedicine. The patient/authorized person expressed understanding that the laws that protect confidentiality also apply to telemedicine. The patient/authorized person acknowledged understanding that telemedicine does not provide emergency services and that he or she would need to call 911 or proceed to the nearest hospital for help if such a need arose.   Total time spent in the clinical discussion 10 minutes.  Telehealth Modality: Phone visit (audio only)  I had a telephone visit with Mr. Miguel Wolfe.  We reviewed the results of his CT scan.  He has met with Dr. Sherrod who plans on obtaining a PET/CT, and potential biopsy.  I agree with this plan.  He will follow-up as needed.

## 2023-01-22 NOTE — Progress Notes (Unsigned)
 Requests ativan pre med MRI due to claustrophobia - MRI on 01/21- [facility] [address] [place] .

## 2023-01-29 ENCOUNTER — Ambulatory Visit
Admission: RE | Admit: 2023-01-29 | Discharge: 2023-01-29 | Disposition: A | Discharge status: Home / Self Care | Payer: Medicare Other | Source: Ambulatory Visit | Attending: Radiation Oncology | Admitting: Radiation Oncology

## 2023-01-29 DIAGNOSIS — D1809 Hemangioma of other sites: Secondary | ICD-10-CM | POA: Diagnosis not present

## 2023-01-29 DIAGNOSIS — C7951 Secondary malignant neoplasm of bone: Secondary | ICD-10-CM

## 2023-01-29 MED ORDER — GADOPICLENOL 0.5 MMOL/ML IV SOLN
7.0000 mL | Freq: Once | INTRAVENOUS | Status: AC | PRN
  Administered 2023-01-29: 7 mL via INTRAVENOUS

## 2023-02-01 ENCOUNTER — Encounter (HOSPITAL_COMMUNITY)
Admission: RE | Admit: 2023-02-01 | Discharge: 2023-02-01 | Disposition: A | Discharge status: Home / Self Care | Payer: Medicare Other | Source: Ambulatory Visit | Attending: Internal Medicine | Admitting: Internal Medicine

## 2023-02-01 DIAGNOSIS — C3411 Malignant neoplasm of upper lobe, right bronchus or lung: Secondary | ICD-10-CM | POA: Diagnosis not present

## 2023-02-01 DIAGNOSIS — C3490 Malignant neoplasm of unspecified part of unspecified bronchus or lung: Secondary | ICD-10-CM | POA: Diagnosis not present

## 2023-02-01 LAB — GLUCOSE, CAPILLARY: Glucose-Capillary: 133 mg/dL — ABNORMAL HIGH (ref 70–99)

## 2023-02-01 MED ORDER — FLUDEOXYGLUCOSE F - 18 (FDG) INJECTION
7.8800 | Freq: Once | INTRAVENOUS | Status: AC
  Administered 2023-02-01: 7.88 via INTRAVENOUS

## 2023-02-04 NOTE — Progress Notes (Signed)
Miguel Wolfe presents today for follow-up after completing radiation to his spine on 01/24/2022 and to review MRI scan results from 01/30/2023  Does the patient complain of any of the following: Headache: He reports very rarely has a headache, taking OTC medications if needed. Visual Changes: No Hearing Changes: No Nausea: No Vomiting: No Balance or coordination issues: No Memory issues: No He reports some back pain, using OTC medications for relief.  Is the patient currently on a Decadron regimen?: No  Additional comments if applicable: Had restaging PET scan (ordered by medical oncologist Dr. Arbutus Ped) on 02/01/2023.

## 2023-02-05 ENCOUNTER — Ambulatory Visit
Admission: RE | Admit: 2023-02-05 | Discharge: 2023-02-05 | Disposition: A | Discharge status: Home / Self Care | Payer: Medicare Other | Source: Ambulatory Visit | Attending: Radiation Oncology | Admitting: Radiation Oncology

## 2023-02-05 VITALS — BP 140/76 | HR 63 | Temp 97.7°F | Resp 20 | Ht 71.0 in | Wt 164.8 lb

## 2023-02-05 DIAGNOSIS — Z79899 Other long term (current) drug therapy: Secondary | ICD-10-CM | POA: Diagnosis not present

## 2023-02-05 DIAGNOSIS — Z79890 Hormone replacement therapy (postmenopausal): Secondary | ICD-10-CM | POA: Diagnosis not present

## 2023-02-05 DIAGNOSIS — I700 Atherosclerosis of aorta: Secondary | ICD-10-CM | POA: Diagnosis not present

## 2023-02-05 DIAGNOSIS — I2510 Atherosclerotic heart disease of native coronary artery without angina pectoris: Secondary | ICD-10-CM | POA: Diagnosis not present

## 2023-02-05 DIAGNOSIS — D1809 Hemangioma of other sites: Secondary | ICD-10-CM | POA: Insufficient documentation

## 2023-02-05 DIAGNOSIS — Z7982 Long term (current) use of aspirin: Secondary | ICD-10-CM | POA: Diagnosis not present

## 2023-02-05 NOTE — Progress Notes (Signed)
Radiation Oncology         [phone] ________________________________  Name: Miguel Wolfe MRN: [id number]  Date: 02/05/2023  DOB: [birth year]  Follow-Up Visit Note  Outpatient  CC: [name], MD  [name], MD  Diagnosis:   No diagnosis found.  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  ***  ALLERGIES:  is allergic to propranolol, gemfibrozil, niaspan [niacin er (antihyperlipidemic)], and simvastatin.  Meds: Current Outpatient Medications  Medication Sig Dispense Refill   acetaminophen (TYLENOL) 500 MG tablet Take 500 mg by mouth every 6 (six) hours as needed for moderate pain (Back pain).     amLODipine (NORVASC) 10 MG tablet Take 10 mg by mouth daily.     aspirin EC 81 MG tablet Take 81 mg by mouth at bedtime.     clobetasol (TEMOVATE) 0.05 % external solution Apply 1 Application topically daily.     Coenzyme Q10 (COQ10) 100 MG CAPS Take 100 mg by mouth daily.     ezetimibe (ZETIA) 10 MG tablet Take 1 tablet (10 mg total) by mouth daily. 90 tablet 3   famotidine (PEPCID) 20 MG tablet Take 20 mg by mouth daily.     fluticasone (FLONASE) 50 MCG/ACT nasal spray Place 1  spray into both nostrils daily as needed for allergies or rhinitis (Spring and Fall).     HYDROcodone-acetaminophen (NORCO/VICODIN) 5-325 MG tablet Take 1 tablet by mouth every 6 (six) hours as needed for moderate pain. 20 tablet 0   ibuprofen (ADVIL) 200 MG tablet Take 200 mg by mouth every 6 (six) hours as needed for moderate pain.     levothyroxine (SYNTHROID) 150 MCG tablet Take 125 mcg by mouth daily before breakfast.     LORazepam (ATIVAN) 0.5 MG tablet Take 1 tablet (0.5 mg total) by mouth daily as needed for anxiety (ONLY FOR MRIs). 2 tablet 0   losartan (COZAAR) 100 MG tablet Take 100 mg by mouth daily.     nitroGLYCERIN (NITROSTAT) 0.4 MG SL tablet PLACE 1 TABLET UNDER THE TONGUE EVERY 5 MINUTES AS NEEDED FOR CHEST PAIN 25 tablet 11   Omega-3 Fatty Acids (FISH OIL) 1000 MG CAPS Take 2,000 mg by mouth in the morning and at bedtime.     pantoprazole (PROTONIX) 40 MG tablet Take 40 mg by mouth every evening.     pravastatin (PRAVACHOL) 80 MG tablet TAKE 1 TABLET(80 MG) BY MOUTH DAILY 90 tablet 2   propranolol (INDERAL) 20 MG tablet Take 1 tablet (20 mg total) by mouth 2 (two) times daily. 180 tablet 3   zolpidem (AMBIEN) 10 MG tablet Take 10 mg by mouth at bedtime.  0   No current facility-administered medications for this encounter.    Physical Findings:  The patient is in no acute distress. Patient is alert and oriented.  vitals were not taken for this visit. .   In general this is a well appearing male in no acute distress. He's alert and oriented x4 and appropriate throughout the examination. Cardiopulmonary assessment is negative for acute distress and he exhibits normal effort. Ambulating on his own.   ***    Lab Findings: Lab Results  Component Value Date   WBC 4.6 05/03/2022   HGB 14.7 05/03/2022   HCT 43.1 05/03/2022   MCV 91.1 05/03/2022   PLT 157 05/03/2022    Radiographic Findings: NM PET Image Restage (PS) Skull Base to Thigh (F-18 FDG) Result Date:  02/01/2023 CLINICAL DATA:  Subsequent treatment strategy for non-small cell lung cancer. EXAM: NUCLEAR MEDICINE PET SKULL BASE TO THIGH TECHNIQUE: 7.9 mCi F-18 FDG was injected intravenously. Full-ring PET imaging was performed from the skull base to thigh after the radiotracer. CT data was obtained and used for attenuation correction and anatomic localization. Fasting blood glucose: 133 mg/dl COMPARISON:  PET-CT 01/10/7251, CT chest 01/15/2023 FINDINGS: Mediastinal blood pool activity: SUV max 2.2 Liver activity: SUV max NA NECK: No hypermetabolic lymph nodes in the neck. Incidental CT findings: None. CHEST: Elongated nodule with peripheral ground-glass density again demonstrated in the RIGHT upper lobe measuring 19 mm (image 63/series 4). This irregular nodule has very low metabolic activity (SUV max equal 1.8) which is less than background mediastinal blood pool activity. No hypermetabolic mediastinal lymph nodes. Incidental CT findings: Postsurgical change in the LEFT lung without evidence local recurrence. ABDOMEN/PELVIS: No abnormal hypermetabolic activity within the liver, pancreas, adrenal glands, or spleen. No hypermetabolic lymph nodes in the abdomen or pelvis. Incidental CT findings: None. SKELETON: No focal hypermetabolic activity to suggest skeletal metastasis. Incidental CT findings: None. IMPRESSION: 1. Elongated nodule with peripheral ground-glass density in the RIGHT upper lobe has very low metabolic activity. Findings not typical of primary bronchogenic carcinoma; however, cannot exclude low-grade adenocarcinoma. 2. No evidence of lung cancer recurrence in the LEFT lung. 3. No evidence of metastatic disease elsewhere Electronically Signed   By: Genevive Bi M.D.   On: 02/01/2023 11:24   MR Lumbar Spine W Wo Contrast Result Date: 01/30/2023 CLINICAL DATA:  Follow-up L1 hemangioma.  History of lung cancer. EXAM: MRI LUMBAR SPINE WITHOUT AND WITH CONTRAST TECHNIQUE: Multiplanar and multiecho  pulse sequences of the lumbar spine were obtained without and with intravenous contrast. CONTRAST:  7 mL Vueway intravenous contrast. COMPARISON:  MRI lumbar spine dated July 26, 2022. FINDINGS: Segmentation:  Standard. Alignment:  Unchanged trace retrolisthesis at L1-L2. Vertebrae: Unchanged appearance of the biopsy-proven L1 hemangioma complicated by chronic pathologic fracture. No acute fracture, evidence of discitis, or new bone lesion. Prior T12-L1 posterior fusion. Conus medullaris and cauda equina: Conus extends to the L1 level. Conus and cauda equina appear normal. No abnormal intrathecal enhancement. Paraspinal and other soft tissues: Unchanged well-defined oval 2.8 x 1.6 cm paravertebral mass along the right aspect of the L1 vertebral body, demonstrating similar signal intensity and enhancement pattern to the L1 hemangioma. Disc levels: Similar chronic disc height loss at L5-S1. No significant disc bulge or herniation. No high-grade spinal canal or neuroforaminal stenosis. IMPRESSION: 1. 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 2. 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 Electronically Signed   By: Obie Dredge M.D.   On: 01/30/2023 10:10  CT Chest W Contrast Result Date: 01/21/2023 CLINICAL DATA:  Non-small cell left lung cancer, for restaging EXAM: CT CHEST WITH CONTRAST TECHNIQUE: Multidetector CT imaging of the chest was performed during intravenous contrast administration. RADIATION DOSE REDUCTION: This exam was performed according to the departmental dose-optimization program which includes automated exposure control, adjustment of the mA and/or kV according to patient size and/or use of iterative reconstruction technique. CONTRAST:  75mL OMNIPAQUE IOHEXOL 300 MG/ML  SOLN COMPARISON:  CT chest dated 03/01/2022 FINDINGS: Cardiovascular: The heart is normal  in size. No pericardial effusion. No evidence of thoracic aortic aneurysm. Atherosclerotic calcifications of the aortic arch. Moderate three-vessel coronary atherosclerosis. Mediastinum/Nodes: No suspicious mediastinal lymphadenopathy. Status post thyroidectomy. Lungs/Pleura: Status post left upper lobectomy. Subsolid right apical opacity measuring 2.8 x 1.1 cm (series 6/image 36), chronic, but with central nodular soft tissue density measuring at least 13 mm (series 6/image 35). Semi invasive adenocarcinoma remains possible. Otherwise, no suspicious pulmonary nodules. No focal consolidation. No pleural effusion or pneumothorax. Upper Abdomen: Visualized upper abdomen is grossly unremarkable. Musculoskeletal: Suspected vertebral hemangioma at L1, although with stable mild to moderate pathologic fracture. Posterior lumbar fixation at T12-L2. Stable right paraspinal lesion at L1 (series 2/image 161), favoring a chronic neurogenic tumor. IMPRESSION: Status post left upper lobectomy. Subsolid right apical opacity measuring 2.8 x 1.1 cm, chronic. Semi invasive adenocarcinoma remains possible. At a minimum, attention on annual follow-up is suggested. Stable vertebral and paraspinal lesions at L1, as above. No evidence of metastatic disease. Aortic Atherosclerosis (ICD10-I70.0). Electronically Signed   By: Charline Bills M.D.   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   Recent MRI shows stabilization of hemangioma and the adjacent paravertebral mass. We personally reviewed his serial images with him and his family.  *** Patient states he has also noticed some pain relief since receiving radiation and removal of screws that came loose in his spine. He has healed very well from the radiation. We are very pleased with his progress at this time.   We will continue with 6 month follow-ups at this time. Follow-up MRI in 6 months. Patient will see Dr. Conchita Paris  to review his next scan. We will then see the patient back in one year. He knows he can call back at anytime with questions or concerns before his next visit.   Patient has also been noticing an increase in generalized anxiety and was seeking help for this today. We offered counseling as a first line treatment option. He is interested in trying medication, so we will defer this to his PCP. He knows to call us back if he does change his mind on counseling and we are happy to get him connected with social work.    On date of service, in total, I spent 20 minutes on this encounter. Patient was seen in person.  _____________________________________   Joyice Faster, PA

## 2023-02-06 ENCOUNTER — Other Ambulatory Visit: Payer: Self-pay | Admitting: Radiation Therapy

## 2023-02-06 ENCOUNTER — Encounter: Payer: Self-pay | Admitting: Radiation Oncology

## 2023-02-06 DIAGNOSIS — D1809 Hemangioma of other sites: Secondary | ICD-10-CM

## 2023-02-06 DIAGNOSIS — M8468XD Pathological fracture in other disease, other site, subsequent encounter for fracture with routine healing: Secondary | ICD-10-CM | POA: Diagnosis not present

## 2023-02-06 DIAGNOSIS — M5459 Other low back pain: Secondary | ICD-10-CM | POA: Diagnosis not present

## 2023-02-07 DIAGNOSIS — L661 Lichen planopilaris, unspecified: Secondary | ICD-10-CM | POA: Diagnosis not present

## 2023-02-07 DIAGNOSIS — L570 Actinic keratosis: Secondary | ICD-10-CM | POA: Diagnosis not present

## 2023-02-13 ENCOUNTER — Inpatient Hospital Stay: Payer: Medicare Other | Attending: Internal Medicine

## 2023-02-13 ENCOUNTER — Inpatient Hospital Stay (HOSPITAL_BASED_OUTPATIENT_CLINIC_OR_DEPARTMENT_OTHER): Payer: Medicare Other | Admitting: Internal Medicine

## 2023-02-13 VITALS — BP 138/79 | HR 65 | Temp 98.2°F | Resp 16 | Ht 71.0 in | Wt 165.5 lb

## 2023-02-13 DIAGNOSIS — C3490 Malignant neoplasm of unspecified part of unspecified bronchus or lung: Secondary | ICD-10-CM

## 2023-02-13 DIAGNOSIS — Z800 Family history of malignant neoplasm of digestive organs: Secondary | ICD-10-CM | POA: Insufficient documentation

## 2023-02-13 DIAGNOSIS — R918 Other nonspecific abnormal finding of lung field: Secondary | ICD-10-CM | POA: Insufficient documentation

## 2023-02-13 DIAGNOSIS — C3412 Malignant neoplasm of upper lobe, left bronchus or lung: Secondary | ICD-10-CM | POA: Diagnosis not present

## 2023-02-13 DIAGNOSIS — Z902 Acquired absence of lung [part of]: Secondary | ICD-10-CM | POA: Diagnosis not present

## 2023-02-13 DIAGNOSIS — Z85850 Personal history of malignant neoplasm of thyroid: Secondary | ICD-10-CM | POA: Insufficient documentation

## 2023-02-13 LAB — CMP (CANCER CENTER ONLY)
ALT: 15 U/L (ref 0–44)
AST: 23 U/L (ref 15–41)
Albumin: 4.9 g/dL (ref 3.5–5.0)
Alkaline Phosphatase: 74 U/L (ref 38–126)
Anion gap: 5 (ref 5–15)
BUN: 16 mg/dL (ref 8–23)
CO2: 33 mmol/L — ABNORMAL HIGH (ref 22–32)
Calcium: 9.5 mg/dL (ref 8.9–10.3)
Chloride: 104 mmol/L (ref 98–111)
Creatinine: 0.88 mg/dL (ref 0.61–1.24)
GFR, Estimated: >60 mL/min (ref >60)
Glucose, Bld: 97 mg/dL (ref 70–99)
Potassium: 3.8 mmol/L (ref 3.5–5.1)
Sodium: 142 mmol/L (ref 135–145)
Total Bilirubin: 1 mg/dL (ref 0.0–1.2)
Total Protein: 7.2 g/dL (ref 6.5–8.1)

## 2023-02-13 LAB — CBC WITH DIFFERENTIAL (CANCER CENTER ONLY)
Abs Immature Granulocytes: 0.01 10*3/uL (ref 0.00–0.07)
Basophils Absolute: 0 10*3/uL (ref 0.0–0.1)
Basophils Relative: 1 %
Eosinophils Absolute: 0.2 10*3/uL (ref 0.0–0.5)
Eosinophils Relative: 3 %
HCT: 45.5 % (ref 39.0–52.0)
Hemoglobin: 15.2 g/dL (ref 13.0–17.0)
Immature Granulocytes: 0 %
Lymphocytes Relative: 36 %
Lymphs Abs: 2.1 10*3/uL (ref 0.7–4.0)
MCH: 29.9 pg (ref 26.0–34.0)
MCHC: 33.4 g/dL (ref 30.0–36.0)
MCV: 89.4 fL (ref 80.0–100.0)
Monocytes Absolute: 0.6 10*3/uL (ref 0.1–1.0)
Monocytes Relative: 9 %
Neutro Abs: 3 10*3/uL (ref 1.7–7.7)
Neutrophils Relative %: 51 %
Platelet Count: 179 10*3/uL (ref 150–400)
RBC: 5.09 MIL/uL (ref 4.22–5.81)
RDW: 13 % (ref 11.5–15.5)
WBC Count: 5.9 10*3/uL (ref 4.0–10.5)
nRBC: 0 % (ref 0.0–0.2)

## 2023-02-13 NOTE — Progress Notes (Signed)
 [facility] Telephone:([phone]) [phone]   [phone]  OFFICE PROGRESS NOTE  Seabron Lenis, MD [address] [place] [address]  DIAGNOSIS: Stage IA (T1c, N0, M0) non-small cell lung cancer, adenocarcinoma with no actionable mutation and negative PDL 1 expression diagnosed in June 2020.   PRIOR THERAPY: Status post left upper lobectomy with lymph node dissection on June 24, 2018.  CURRENT THERAPY: Observation.  INTERVAL HISTORY: Miguel Wolfe 78 y.o. male returns to the clinic today for follow-up visit accompanied by his wife.Discussed the use of AI scribe software for clinical note transcription with the patient, who gave verbal consent to proceed.  History of Present Illness   Miguel Wolfe is a 78 year old male with non-small cell lung cancer who presents for follow-up after a left upper lobectomy.  He underwent a left upper lobectomy in May 2020 for stage 1A non-small cell lung cancer and has been under observation since the surgery. A recent CT scan revealed an elongated opacity in the right lung. A subsequent PET scan showed low activity in the area, indicating inflammation rather than cancer. No activity was noted elsewhere.  He inquired about the implications of a negative PET scan and the necessity of a biopsy. He also asked about the potential need for radiation therapy if the opacity turns out to be cancerous. He has no other complaints and is aware of the need to continue routine screenings such as colonoscopy, despite his lung cancer history.  No evidence of metastatic disease on recent PET scan. No skeletal metastasis noted. No other significant symptoms reported.         MEDICAL HISTORY: Past Medical History:  Diagnosis Date   Aortic insufficiency    Arthritis    CAD (coronary artery disease)    One Stent placed   Colon polyps    Dyspnea    With exertion related to 1/2 lung removed   Family history of adverse reaction  to anesthesia    Mom vomits and Dad went nuts   Family history of colon cancer    Family history of stomach cancer    Fatigue    GERD (gastroesophageal reflux disease)    Hemorrhoids    History of echocardiogram    Echo 02/08/16: Mild LVH, EF 55-60, no RWMA, Gr 1 DD, trivial AI, mild LAE   Hyperlipidemia    Hypertension    Lung cancer (HCC)    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   PONV (postoperative nausea and vomiting)    Pre-diabetes    Pulmonary nodules    Thyroid  cancer (HCC)    thyroid  removed    ALLERGIES:  is allergic to propranolol , gemfibrozil, niaspan [niacin er (antihyperlipidemic)], and simvastatin.  MEDICATIONS:  Current Outpatient Medications  Medication Sig Dispense Refill   acetaminophen  (TYLENOL ) 500 MG tablet Take 500 mg by mouth every 6 (six) hours as needed for moderate pain (Back pain).     amLODipine  (NORVASC ) 10 MG tablet Take 10 mg by mouth daily.     aspirin  EC 81 MG tablet Take 81 mg by mouth at bedtime.     clobetasol (TEMOVATE) 0.05 % external solution Apply 1 Application topically daily.     Coenzyme Q10 (COQ10) 100 MG CAPS Take 100 mg by mouth daily.     ezetimibe  (ZETIA ) 10 MG tablet Take 1 tablet (10 mg total) by mouth daily. 90 tablet 3   famotidine  (PEPCID ) 20 MG tablet Take 20 mg by mouth  daily.     fluticasone  (FLONASE ) 50 MCG/ACT nasal spray Place 1 spray into both nostrils daily as needed for allergies or rhinitis (Spring and Fall).     HYDROcodone -acetaminophen  (NORCO/VICODIN) 5-325 MG tablet Take 1 tablet by mouth every 6 (six) hours as needed for moderate pain. 20 tablet 0   ibuprofen  (ADVIL ) 200 MG tablet Take 200 mg by mouth every 6 (six) hours as needed for moderate pain.     levothyroxine  (SYNTHROID ) 150 MCG tablet Take 125 mcg by mouth daily before breakfast.     LORazepam  (ATIVAN ) 0.5 MG tablet Take 1 tablet (0.5 mg total) by mouth daily as needed for anxiety (ONLY FOR MRIs). 2 tablet 0   losartan  (COZAAR ) 100 MG tablet Take 100 mg by mouth daily.      nitroGLYCERIN  (NITROSTAT ) 0.4 MG SL tablet PLACE 1 TABLET UNDER THE TONGUE EVERY 5 MINUTES AS NEEDED FOR CHEST PAIN 25 tablet 11   Omega-3 Fatty Acids (FISH OIL) 1000 MG CAPS Take 2,000 mg by mouth in the morning and at bedtime.     pantoprazole  (PROTONIX ) 40 MG tablet Take 40 mg by mouth every evening.     pravastatin  (PRAVACHOL ) 80 MG tablet TAKE 1 TABLET(80 MG) BY MOUTH DAILY 90 tablet 2   propranolol  (INDERAL ) 20 MG tablet Take 1 tablet (20 mg total) by mouth 2 (two) times daily. 180 tablet 3   zolpidem  (AMBIEN ) 10 MG tablet Take 10 mg by mouth at bedtime.  0   No current facility-administered medications for this visit.    SURGICAL HISTORY:  Past Surgical History:  Procedure Laterality Date   BACK SURGERY     COLONOSCOPY WITH ESOPHAGOGASTRODUODENOSCOPY (EGD)  2008   CORONARY STENT INTERVENTION N/A 02/20/2016   Procedure: Coronary Stent Intervention;  Surgeon: Lonni JONETTA Cash, MD;  Location: [facility] IN[facility]ASIVE CV LAB;  Service: Cardiovascular;  Laterality: N/A;   ESOPHAGOGASTRODUODENOSCOPY     HARDWARE REMOVAL Bilateral 05/10/2022   Procedure: REMOVAL OF THORACIC ELEVEN AND LUMBAR THREE PEDICLE SCREWS AND ROD SEGMENTS;  Surgeon: Lanis Pupa, MD;  Location: [facility] OR;  Service: Neurosurgery;  Laterality: Bilateral;  3C   INGUINAL HERNIA REPAIR Right 02/07/2022   Procedure: OPEN RIGHT INGUINAL HERNIA REPAIR WITH MESH;  Surgeon: Kinsinger, Herlene Righter, MD;  Location: [facility] ORS;  Service: General;  Laterality: Right;   LEFT HEART CATH AND CORONARY ANGIOGRAPHY N/A 02/20/2016   Procedure: Left Heart Cath and Coronary Angiography;  Surgeon: Lonni JONETTA Cash, MD;  Location: [facility] INVASIVE CV LAB;  Service: Cardiovascular;  Laterality: N/A;   LUNG REMOVAL, PARTIAL Left 06/24/2018   Pt states 1/2 of lung was removed   PILONIDAL CYST EXCISION     REFRACTIVE SURGERY Right    retina tear repair   THYROIDECTOMY N/A 01/17/2018   Procedure: TOTAL THYROIDECTOMY;  Surgeon: Eletha Boas, MD;   Location: [facility] ORS;  Service: General;  Laterality: N/A;   VIDEO ASSISTED THORACOSCOPY (VATS)/WEDGE RESECTION Left 06/24/2018   Procedure: VIDEO ASSISTED THORACOSCOPY (VATS)/LUNG RESECTION;  Surgeon: Army Dallas NOVAK, MD;  Location: [facility] OR;  Service: Thoracic;  Laterality: Left;   VIDEO BRONCHOSCOPY WITH ENDOBRONCHIAL NAVIGATION N/A 11/18/2017   Procedure: VIDEO BRONCHOSCOPY WITH ENDOBRONCHIAL NAVIGATION WITH BIOPSIES OF LEFT AND RIGHT UPPER LOBES;  Surgeon: Army Dallas NOVAK, MD;  Location: [facility] OR;  Service: Thoracic;  Laterality: N/A;   VIDEO BRONCHOSCOPY WITH ENDOBRONCHIAL NAVIGATION N/A 06/11/2018   Procedure: VIDEO BRONCHOSCOPY WITH ENDOBRONCHIAL NAVIGATION;  Surgeon: Army Dallas NOVAK, MD;  Location: [facility] OR;  Service: Thoracic;  Laterality:  N/A;    REVIEW OF SYSTEMS:  Constitutional: negative Eyes: negative Ears, nose, mouth, throat, and face: negative Respiratory: negative Cardiovascular: negative Gastrointestinal: negative Genitourinary:negative Integument/breast: negative Hematologic/lymphatic: negative Musculoskeletal:positive for back pain Neurological: negative Behavioral/Psych: negative Endocrine: negative Allergic/Immunologic: negative   PHYSICAL EXAMINATION: General appearance: alert, cooperative, and no distress Head: Normocephalic, without obvious abnormality, atraumatic Neck: no adenopathy, no JVD, supple, symmetrical, trachea midline, and thyroid  not enlarged, symmetric, no tenderness/mass/nodules Lymph nodes: Cervical, supraclavicular, and axillary nodes normal. Resp: clear to auscultation bilaterally Back: symmetric, no curvature. ROM normal. No CVA tenderness. Cardio: regular rate and rhythm, S1, S2 normal, no murmur, click, rub or gallop GI: soft, non-tender; bowel sounds normal; no masses,  no organomegaly Extremities: extremities normal, atraumatic, no cyanosis or edema Neurologic: Alert and oriented X 3, normal strength and tone. Normal symmetric reflexes.  Normal coordination and gait  ECOG PERFORMANCE STATUS: 1 - Symptomatic but completely ambulatory  Blood pressure 138/79, pulse 65, temperature 98.2 F (36.8 C), temperature source Temporal, resp. rate 16, height 5' 11 (1.803 m), weight 165 lb 8 oz (75.1 kg), SpO2 100%.  LABORATORY DATA: Lab Results  Component Value Date   WBC 5.9 02/13/2023   HGB 15.2 02/13/2023   HCT 45.5 02/13/2023   MCV 89.4 02/13/2023   PLT 179 02/13/2023      Chemistry      Component Value Date/Time   NA 140 05/03/2022 0912   NA 143 04/24/2016 0935   K 3.8 05/03/2022 0912   CL 102 05/03/2022 0912   CO2 29 05/03/2022 0912   BUN 12 05/03/2022 0912   BUN 17 04/24/2016 0935   CREATININE 0.70 05/03/2022 0912   CREATININE 0.74 03/01/2022 0842      Component Value Date/Time   CALCIUM  8.9 05/03/2022 0912   ALKPHOS 72 03/01/2022 0842   AST 19 03/01/2022 0842   ALT 12 03/01/2022 0842   BILITOT 0.8 03/01/2022 0842       RADIOGRAPHIC STUDIES: NM PET Image Restage (PS) Skull Base to Thigh (F-18 FDG) Result Date: 02/01/2023 CLINICAL DATA:  Subsequent treatment strategy for non-small cell lung cancer. EXAM: NUCLEAR MEDICINE PET SKULL BASE TO THIGH TECHNIQUE: 7.9 mCi F-18 FDG was injected intravenously. Full-ring PET imaging was performed from the skull base to thigh after the radiotracer. CT data was obtained and used for attenuation correction and anatomic localization. Fasting blood glucose: 133 mg/dl COMPARISON:  PET-CT 90/80/7977, CT chest 01/15/2023 FINDINGS: Mediastinal blood pool activity: SUV max 2.2 Liver activity: SUV max NA NECK: No hypermetabolic lymph nodes in the neck. Incidental CT findings: None. CHEST: Elongated nodule with peripheral ground-glass density again demonstrated in the RIGHT upper lobe measuring 19 mm (image 63/series 4). This irregular nodule has very low metabolic activity (SUV max equal 1.8) which is less than background mediastinal blood pool activity. No hypermetabolic mediastinal  lymph nodes. Incidental CT findings: Postsurgical change in the LEFT lung without evidence local recurrence. ABDOMEN/PELVIS: No abnormal hypermetabolic activity within the liver, pancreas, adrenal glands, or spleen. No hypermetabolic lymph nodes in the abdomen or pelvis. Incidental CT findings: None. SKELETON: No focal hypermetabolic activity to suggest skeletal metastasis. Incidental CT findings: None. IMPRESSION: 1. Elongated nodule with peripheral ground-glass density in the RIGHT upper lobe has very low metabolic activity. Findings not typical of primary bronchogenic carcinoma; however, cannot exclude low-grade adenocarcinoma. 2. No evidence of lung cancer recurrence in the LEFT lung. 3. No evidence of metastatic disease elsewhere Electronically Signed   By: Jackquline Boxer M.D.   On:  02/01/2023 11:24   MR Lumbar Spine W Wo Contrast Result Date: 01/30/2023 CLINICAL DATA:  Follow-up L1 hemangioma.  History of lung cancer. EXAM: MRI LUMBAR SPINE WITHOUT AND WITH CONTRAST TECHNIQUE: Multiplanar and multiecho pulse sequences of the lumbar spine were obtained without and with intravenous contrast. CONTRAST:  7 mL Vueway  intravenous contrast. COMPARISON:  MRI lumbar spine dated July 26, 2022. FINDINGS: Segmentation:  Standard. Alignment:  Unchanged trace retrolisthesis at L1-L2. Vertebrae: Unchanged appearance of the biopsy-proven L1 hemangioma complicated by chronic pathologic fracture. No acute fracture, evidence of discitis, or new bone lesion. Prior T12-L1 posterior fusion. Conus medullaris and cauda equina: Conus extends to the L1 level. Conus and cauda equina appear normal. No abnormal intrathecal enhancement. Paraspinal and other soft tissues: Unchanged well-defined oval 2.8 x 1.6 cm paravertebral mass along the right aspect of the L1 vertebral body, demonstrating similar signal intensity and enhancement pattern to the L1 hemangioma. Disc levels: Similar chronic disc height loss at L5-S1. No significant  disc bulge or herniation. No high-grade spinal canal or neuroforaminal stenosis. IMPRESSION: 1. 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 2. 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 Electronically Signed   By: Elsie ONEIDA Shoulder M.D.   On: 01/30/2023 10:10   CT Chest W Contrast Result Date: 01/21/2023 CLINICAL DATA:  Non-small cell left lung cancer, for restaging EXAM: CT CHEST WITH CONTRAST TECHNIQUE: Multidetector CT imaging of the chest was performed during intravenous contrast administration. RADIATION DOSE REDUCTION: This exam was performed according to the departmental dose-optimization program which includes automated exposure control, adjustment of the mA and/or kV according to patient size and/or use of iterative reconstruction technique. CONTRAST:  75mL OMNIPAQUE  IOHEXOL  300 MG/ML  SOLN COMPARISON:  CT chest dated 03/01/2022 FINDINGS: Cardiovascular: The heart is normal in size. No pericardial effusion. No evidence of thoracic aortic aneurysm. Atherosclerotic calcifications of the aortic arch. Moderate three-vessel coronary atherosclerosis. Mediastinum/Nodes: No suspicious mediastinal lymphadenopathy. Status post thyroidectomy. Lungs/Pleura: Status post left upper lobectomy. Subsolid right apical opacity measuring 2.8 x 1.1 cm (series 6/image 36), chronic, but with central nodular soft tissue density measuring at least 13 mm (series 6/image 35). Semi invasive adenocarcinoma remains possible. Otherwise, no suspicious pulmonary nodules. No focal consolidation. No pleural effusion or pneumothorax. Upper Abdomen: Visualized upper abdomen is grossly unremarkable. Musculoskeletal: Suspected vertebral hemangioma at L1, although with stable mild to moderate pathologic fracture. Posterior lumbar fixation at T12-L2. Stable right paraspinal lesion at L1 (series 2/image 161),  favoring a chronic neurogenic tumor. IMPRESSION: Status post left upper lobectomy. Subsolid right apical opacity measuring 2.8 x 1.1 cm, chronic. Semi invasive adenocarcinoma remains possible. At a minimum, attention on annual follow-up is suggested. Stable vertebral and paraspinal lesions at L1, as above. No evidence of metastatic disease. Aortic Atherosclerosis (ICD10-I70.0). Electronically Signed   By: Pinkie Pebbles M.D.   On: 01/21/2023 23:58     ASSESSMENT AND PLAN: This is a very pleasant 78 years old white male with stage Ia non-small cell lung cancer, adenocarcinoma status post left upper lobectomy with lymph node dissection under the care of Dr. Army in June 2020. The patient is currently on observation and he is feeling fine except for intermittent pain on the left side of the chest from the previous surgical scar. His previous CT scan of the chest showed stable subsolid right apical opacity measuring 2.8 x 1.1 cm still suspicious for slowly growing adenocarcinoma.  The patient had a PET scan performed recently.  I personally and independently reviewed the scan images and discussed the results with the patient and his wife.  His scan showed the elongated nodule with the peripheral groundglass density in the right upper lobe has very low metabolic activity and the findings not typical of primary bronchogenic carcinoma however low-grade adenocarcinoma could not be excluded.  There was no other evidence of metastatic disease or recurrence in the lung.     Non-Small Cell Lung Cancer (NSCLC), Stage 1A NSCLC, Stage 1A, diagnosed in May 2020. Post left upper lobectomy, under observation. Recent CT showed elongated opacity in the right lung; PET scan indicated low likelihood of aggressive cancer, likely inflammation. Discussed potential for slow-growing cancer and stereotactic radiation as a localized treatment option. Patient opted against biopsy due to low PET activity. - Follow-up in six  months. - Order CT scan for next visit.  General Health Maintenance Emphasized the necessity of routine health maintenance screenings, including colonoscopy, despite lung cancer history. PET scans do not effectively detect colorectal polyps or small tumors. - Schedule colonoscopy per routine screening guidelines.   The patient was advised to call immediately if he has any concerning symptoms in the interval.  The patient voices understanding of current disease status and treatment options and is in agreement with the current care plan.  All questions were answered. The patient knows to call the clinic with any problems, questions or concerns. We can certainly see the patient much sooner if necessary.  The total time spent in the appointment was 30 minutes.  Disclaimer: This note was dictated with voice recognition software. Similar sounding words can inadvertently be transcribed and may not be corrected upon review.

## 2023-02-15 ENCOUNTER — Ambulatory Visit: Payer: Medicare Other | Admitting: Thoracic Surgery (Cardiothoracic Vascular Surgery)

## 2023-02-21 DIAGNOSIS — E890 Postprocedural hypothyroidism: Secondary | ICD-10-CM | POA: Diagnosis not present

## 2023-02-26 DIAGNOSIS — Z85850 Personal history of malignant neoplasm of thyroid: Secondary | ICD-10-CM | POA: Diagnosis not present

## 2023-02-26 DIAGNOSIS — R7303 Prediabetes: Secondary | ICD-10-CM | POA: Diagnosis not present

## 2023-02-26 DIAGNOSIS — E890 Postprocedural hypothyroidism: Secondary | ICD-10-CM | POA: Diagnosis not present

## 2023-03-01 DIAGNOSIS — E890 Postprocedural hypothyroidism: Secondary | ICD-10-CM | POA: Diagnosis not present

## 2023-03-01 DIAGNOSIS — I10 Essential (primary) hypertension: Secondary | ICD-10-CM | POA: Diagnosis not present

## 2023-03-01 DIAGNOSIS — K219 Gastro-esophageal reflux disease without esophagitis: Secondary | ICD-10-CM | POA: Diagnosis not present

## 2023-03-01 DIAGNOSIS — F419 Anxiety disorder, unspecified: Secondary | ICD-10-CM | POA: Diagnosis not present

## 2023-03-01 DIAGNOSIS — R7303 Prediabetes: Secondary | ICD-10-CM | POA: Diagnosis not present

## 2023-03-01 DIAGNOSIS — Z85850 Personal history of malignant neoplasm of thyroid: Secondary | ICD-10-CM | POA: Diagnosis not present

## 2023-03-01 DIAGNOSIS — G4700 Insomnia, unspecified: Secondary | ICD-10-CM | POA: Diagnosis not present

## 2023-03-01 DIAGNOSIS — D696 Thrombocytopenia, unspecified: Secondary | ICD-10-CM | POA: Diagnosis not present

## 2023-03-01 DIAGNOSIS — I25111 Atherosclerotic heart disease of native coronary artery with angina pectoris with documented spasm: Secondary | ICD-10-CM | POA: Diagnosis not present

## 2023-03-01 DIAGNOSIS — Z85118 Personal history of other malignant neoplasm of bronchus and lung: Secondary | ICD-10-CM | POA: Diagnosis not present

## 2023-03-01 DIAGNOSIS — E782 Mixed hyperlipidemia: Secondary | ICD-10-CM | POA: Diagnosis not present

## 2023-03-04 ENCOUNTER — Other Ambulatory Visit: Payer: Medicare Other

## 2023-03-06 ENCOUNTER — Ambulatory Visit: Payer: Medicare Other | Admitting: Internal Medicine

## 2023-03-06 NOTE — Progress Notes (Unsigned)
 Cardiology Office Note:  .   Date:  03/07/2023  ID:  Miguel Wolfe, DOB [birth year], MRN [id number] PCP: [name], MD  [facility] Providers Cardiologist:  Donato Schultz, MD    Patient Profile: .      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 Anne Fu 02/06/2016 for dyspnea on exertion.  At that time he complained of a several month history of fatigue, shortness of breath, and decreased energy particularly when doing yard work.  He found doing 1 hour of yard work challenging, however was able to walk his neighborhood normally without any symptoms.  He stopped taking statin secondary to myalgias.  He underwent nuclear stress test which was consistent with prior inferolateral MI with peri-infarct ischemia.  He underwent LHC that revealed 80% stenosis in proximal to mid RCA which was treated with PTCA/DES x 1.  At follow-up visit 07/2021, he was doing well.  He underwent radiation for hemangioma on his spine November 2023.  He reported he was walking daily with his wife and gets approximately 7000-8000 steps daily.  He is being followed by PCP for low platelet count.  Last cardiology clinic visit was 09/05/2022 with Dr.  Anne Fu at which time he reported occasional chest discomfort which he describes as a feeling rather than pain located in the midsternal region.  He attributes this discomfort to previous lung surgery.  He reported more fatigue, particularly in the afternoons.  He continued to lead an active lifestyle including daily walks and woodworking.  Most recent echo 05/2022 revealed EF 60 to 65%, G1 DD, mild to moderate AR, aortic valve sclerosis without stenosis, mild dilatation of ascending aorta and aortic root, no significant changes from echo 05/2021.  LDL cholesterol is well-controlled on pravastatin and Zetia.  He was switched from carvedilol to propranolol due to benefit with anxiety symptoms. Follow-up was recommended in 6 months.        History of Present Illness: .   Miguel Wolfe is a very pleasant 78 y.o. male who is here today for 6 month follow-up of aortic valve disease and CAD. He reports no new complaints and is generally doing well. He remains active, walking daily for 20-30 minutes. He mentions occasional chest discomfort, which he attributes to previous lung surgery. Chest discomfort does not worsen with exertion and has remained consistent for some time. He is planning for colonoscopy in April and wants to know if he is ok to proceed. He has concerns about his diet in relation to his prediabetes and heart health. He is currently trying to manage prediabetes through dietary changes, including increasing protein intake and reducing carbohydrate intake. He has chronic back pain which he tries to manage with Tylenol, but reports taking Advil a couple of times a  week. Home BP typically 120s to 130s systolic. He has minor rectal bleeding secondary to hemorrhoids, but no significant blood loss.   Discussed the use of AI scribe software for clinical note transcription with the patient, who gave verbal consent to proceed.   ROS: See HPI       Studies Reviewed: Marland Kitchen   EKG  Interpretation Date/Time:  Thursday March 07 2023 10:30:03 EST Ventricular Rate:  61 PR Interval:  142 QRS Duration:  102 QT Interval:  408 QTC Calculation: 410 R Axis:   -38  Text Interpretation: Normal sinus rhythm with sinus arrhythmia Left axis deviation Minimal voltage criteria for LVH, may be normal variant ( R in aVL ) When compared with ECG of 16-Feb-2021 08:53, QRS axis Shifted left No acute ST abnormality Confirmed by Eligha Bridegroom [id number]) on 03/07/2023 10:40:52 AM    Risk Assessment/Calculations:             Physical Exam:   VS:  BP 132/68 (BP Location: Right Arm)   Pulse 61   Ht 5\' 11"  (1.803 m)   Wt 165 lb (74.8 kg)   SpO2 97%   BMI 23.01 kg/m    Wt Readings from Last 3 Encounters:  03/07/23 165 lb (74.8 kg)  02/13/23 165 lb 8 oz (75.1 kg)  02/05/23 164 lb 12.8 oz (74.8 kg)    GEN: Well nourished, well developed in no acute distress NECK: No JVD; No carotid bruits CARDIAC: RRR, + murmur. No rubs, gallops RESPIRATORY:  Clear to auscultation without rales, wheezing or rhonchi  ABDOMEN: Soft, non-tender, non-distended EXTREMITIES:  No edema; No deformity     ASSESSMENT AND PLAN: .    Preoperative cardiac evaluation: According to the Revised Cardiac Risk Index (RCRI), his Perioperative Risk of Major Cardiac Event is (%): 0.9. His Functional Capacity in METs is: 8.23 according to the Duke Activity Status Index (DASI). The patient is doing well from a cardiac perspective. Therefore, based on ACC/AHA guidelines, the patient would be at acceptable risk for the planned procedure without further cardiovascular testing. Per office protocol, he may hold aspirin for 5-7 days prior to procedure and should resume as soon as hemodynamically stable postoperatively.  CAD: S/p PTCA/DES to [facility] 02/2016 with non-obstructive residual disease elsewhere. He is actively exercising with daily walking for 20-30 minutes. He denies chest pain, dyspnea, or other symptoms concerning for  angina. No indication for further ischemic evaluation at this time. EKG does not reveal any acute ST abnormality. No significant bleeding problems. Continue aspirin, ezetimibe, amlodipine, pravastatin, losartan. Focus on secondary prevention including heart healthy mostly plant based diet avoiding saturated fat, processed foods, simple carbohydrates, and sugar along with aiming for at least 150 minutes of moderate intensity exercise each week.   Hyperlipidemia LDL goal < 55: Lipid panel completed 03/01/23 with total cholesterol 158, HDL 65, LDL 76, triglycerides 91. We discussed that lipid is above goal and additional agents that might be beneficial.  He would like to work on better diet and exercise prior to considering novel lipid-lowering therapy such as PCSK9i or bempedoic acid.  He was intolerant of rosuvastatin and atorvastatin. Continue ezetimibe and pravastatin.   Hypertension: BP is well controlled.  He has been monitoring carefully due to a recent elevated reading. Renal function stable on labs completed 02/13/2023. We will continue current antihypertensive therapy with amlodipine, losartan, and propranolol.  Aortic valve insufficiency: Mild to moderate AI, mild calcification of aortic valve with no stenosis, normal LVEF on echo 05/28/22, essentially unchanged  from previous echo 05/2021.  He is asymptomatic.  We discussed routine monitoring and I advised him that there is no indication to repeat imaging at this time.  I have asked him to notify us if he develops concerning symptoms.  We will continue to monitor clinically for now.       Disposition:6 months with Dr. Anne Fu  Signed, Eligha Bridegroom, NP-C

## 2023-03-07 ENCOUNTER — Ambulatory Visit: Payer: Medicare Other | Attending: Nurse Practitioner | Admitting: Nurse Practitioner

## 2023-03-07 ENCOUNTER — Encounter: Payer: Self-pay | Admitting: Nurse Practitioner

## 2023-03-07 VITALS — BP 132/68 | HR 61 | Ht 71.0 in | Wt 165.0 lb

## 2023-03-07 DIAGNOSIS — I351 Nonrheumatic aortic (valve) insufficiency: Secondary | ICD-10-CM | POA: Diagnosis not present

## 2023-03-07 DIAGNOSIS — I2510 Atherosclerotic heart disease of native coronary artery without angina pectoris: Secondary | ICD-10-CM

## 2023-03-07 DIAGNOSIS — E785 Hyperlipidemia, unspecified: Secondary | ICD-10-CM

## 2023-03-07 DIAGNOSIS — Z01810 Encounter for preprocedural cardiovascular examination: Secondary | ICD-10-CM | POA: Diagnosis not present

## 2023-03-07 DIAGNOSIS — I10 Essential (primary) hypertension: Secondary | ICD-10-CM | POA: Diagnosis not present

## 2023-03-07 NOTE — Patient Instructions (Signed)
 Medication Instructions:  Your physician recommends that you continue on your current medications as directed. Please refer to the Current Medication list given to you today.   *If you need a refill on your cardiac medications before your next appointment, please call your pharmacy*   Lab Work: None ordered  If you have labs (blood work) drawn today and your tests are completely normal, you will receive your results only by: MyChart Message (if you have MyChart) OR A paper copy in the mail If you have any lab test that is abnormal or we need to change your treatment, we will call you to review the results.   Testing/Procedures: None ordered   Follow-Up: At [facility], you and your health needs are our priority.  As part of our continuing mission to provide you with exceptional heart care, we have created designated Provider Care Teams.  These Care Teams include your primary Cardiologist (physician) and Advanced Practice Providers (APPs -  Physician Assistants and Nurse Practitioners) who all work together to provide you with the care you need, when you need it.  We recommend signing up for the patient portal called "MyChart".  Sign up information is provided on this After Visit Summary.  MyChart is used to connect with patients for Virtual Visits (Telemedicine).  Patients are able to view lab/test results, encounter notes, upcoming appointments, etc.  Non-urgent messages can be sent to your provider as well.   To learn more about what you can do with MyChart, go to [email or web address].    Your next appointment:   6 month(s)  Provider:   Donato Schultz, MD     Other Instructions      1st Floor: - Lobby - Registration  - Pharmacy  - Lab - Cafe  2nd Floor: - PV Lab - Diagnostic Testing (echo, CT, nuclear med)  3rd Floor: - Vacant  4th Floor: - TCTS (cardiothoracic surgery) - AFib Clinic - Structural Heart Clinic - Vascular Surgery  - Vascular  Ultrasound  5th Floor: - [facility] (general and EP) - Clinical Pharmacy for coumadin, hypertension, lipid, weight-loss medications, and med management appointments    Valet parking services will be available as well.

## 2023-04-03 DIAGNOSIS — I25111 Atherosclerotic heart disease of native coronary artery with angina pectoris with documented spasm: Secondary | ICD-10-CM | POA: Diagnosis not present

## 2023-04-03 DIAGNOSIS — I10 Essential (primary) hypertension: Secondary | ICD-10-CM | POA: Diagnosis not present

## 2023-04-04 DIAGNOSIS — M47816 Spondylosis without myelopathy or radiculopathy, lumbar region: Secondary | ICD-10-CM | POA: Diagnosis not present

## 2023-04-08 DIAGNOSIS — E782 Mixed hyperlipidemia: Secondary | ICD-10-CM | POA: Diagnosis not present

## 2023-04-08 DIAGNOSIS — Z85118 Personal history of other malignant neoplasm of bronchus and lung: Secondary | ICD-10-CM | POA: Diagnosis not present

## 2023-04-08 DIAGNOSIS — I25111 Atherosclerotic heart disease of native coronary artery with angina pectoris with documented spasm: Secondary | ICD-10-CM | POA: Diagnosis not present

## 2023-04-08 DIAGNOSIS — E890 Postprocedural hypothyroidism: Secondary | ICD-10-CM | POA: Diagnosis not present

## 2023-04-09 DIAGNOSIS — M47816 Spondylosis without myelopathy or radiculopathy, lumbar region: Secondary | ICD-10-CM | POA: Diagnosis not present

## 2023-04-15 DIAGNOSIS — M47816 Spondylosis without myelopathy or radiculopathy, lumbar region: Secondary | ICD-10-CM | POA: Diagnosis not present

## 2023-04-17 DIAGNOSIS — Z23 Encounter for immunization: Secondary | ICD-10-CM | POA: Diagnosis not present

## 2023-04-22 DIAGNOSIS — Z1211 Encounter for screening for malignant neoplasm of colon: Secondary | ICD-10-CM | POA: Diagnosis not present

## 2023-04-22 DIAGNOSIS — R1032 Left lower quadrant pain: Secondary | ICD-10-CM | POA: Diagnosis not present

## 2023-04-22 DIAGNOSIS — Z859 Personal history of malignant neoplasm, unspecified: Secondary | ICD-10-CM | POA: Diagnosis not present

## 2023-04-23 ENCOUNTER — Other Ambulatory Visit: Payer: Self-pay | Admitting: Family Medicine

## 2023-04-23 ENCOUNTER — Encounter: Payer: Self-pay | Admitting: Physical Therapy

## 2023-04-23 ENCOUNTER — Ambulatory Visit: Attending: Neurosurgery | Admitting: Physical Therapy

## 2023-04-23 DIAGNOSIS — M62830 Muscle spasm of back: Secondary | ICD-10-CM | POA: Diagnosis not present

## 2023-04-23 DIAGNOSIS — M5459 Other low back pain: Secondary | ICD-10-CM | POA: Diagnosis not present

## 2023-04-23 DIAGNOSIS — R1032 Left lower quadrant pain: Secondary | ICD-10-CM

## 2023-04-23 NOTE — Patient Instructions (Signed)

## 2023-04-23 NOTE — Therapy (Signed)
 OUTPATIENT PHYSICAL THERAPY THORACOLUMBAR EVALUATION   Patient Name: Miguel Wolfe MRN: [id number] DOB:May 12, 1945, 78 y.o., male Today's Date: 04/23/2023  END OF SESSION:  PT End of Session - 04/23/23 1010     Visit Number 1    Date for PT Re-Evaluation 07/23/23    Authorization Type Medicare    PT Start Time 1010    PT Stop Time 1100    PT Time Calculation (min) 50 min             Past Medical History:  Diagnosis Date   Aortic insufficiency    Arthritis    CAD (coronary artery disease)    One Stent placed   Colon polyps    Dyspnea    With exertion related to 1/2 lung removed   Family history of adverse reaction to anesthesia    Mom vomits and Dad went "nuts"   Family history of colon cancer    Family history of stomach cancer    Fatigue    GERD (gastroesophageal reflux disease)    Hemorrhoids    History of echocardiogram    Echo 02/08/16: Mild LVH, EF 55-60, no RWMA, Gr 1 DD, trivial AI, mild LAE   Hyperlipidemia    Hypertension    Lung cancer (HCC)    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   PONV (postoperative nausea and vomiting)    Pre-diabetes    Pulmonary nodules    Thyroid cancer (HCC)    thyroid removed   Past Surgical History:  Procedure Laterality Date   BACK SURGERY     COLONOSCOPY WITH ESOPHAGOGASTRODUODENOSCOPY (EGD)  2008   CORONARY STENT INTERVENTION N/A 02/20/2016   Procedure: Coronary Stent Intervention;  Surgeon: Kathleene Hazel, MD;  Location: [facility] INVASIVE CV LAB;  Service: Cardiovascular;  Laterality: N/A;   ESOPHAGOGASTRODUODENOSCOPY     HARDWARE REMOVAL Bilateral 05/10/2022   Procedure: REMOVAL OF THORACIC ELEVEN AND LUMBAR THREE PEDICLE SCREWS AND ROD SEGMENTS;  Surgeon: [name], MD;  Location: [facility] OR;  Service: Neurosurgery;  Laterality: Bilateral;  3C   INGUINAL HERNIA REPAIR Right 02/07/2022   Procedure: OPEN RIGHT INGUINAL HERNIA REPAIR WITH MESH;  Surgeon: Kinsinger, De Blanch, MD;  Location: [facility] ORS;  Service: General;  Laterality:  Right;   LEFT HEART CATH AND CORONARY ANGIOGRAPHY N/A 02/20/2016   Procedure: Left Heart Cath and Coronary Angiography;  Surgeon: Kathleene Hazel, MD;  Location: [facility] INVASIVE CV LAB;  Service: Cardiovascular;  Laterality: N/A;   LUNG REMOVAL, PARTIAL Left 06/24/2018   Pt states "1/2 of lung was removed"   PILONIDAL CYST EXCISION     REFRACTIVE SURGERY Right    retina tear repair   THYROIDECTOMY N/A 01/17/2018   Procedure: TOTAL THYROIDECTOMY;  Surgeon: Darnell Level, MD;  Location: [facility] ORS;  Service: General;  Laterality: N/A;   VIDEO ASSISTED THORACOSCOPY (VATS)/WEDGE RESECTION Left 06/24/2018   Procedure: VIDEO ASSISTED THORACOSCOPY (VATS)/LUNG RESECTION;  Surgeon: Delight Ovens, MD;  Location: [facility] OR;  Service: Thoracic;  Laterality: Left;   VIDEO BRONCHOSCOPY WITH ENDOBRONCHIAL NAVIGATION N/A 11/18/2017   Procedure: VIDEO BRONCHOSCOPY WITH ENDOBRONCHIAL NAVIGATION WITH BIOPSIES OF LEFT AND RIGHT UPPER LOBES;  Surgeon: Delight Ovens, MD;  Location: [facility] OR;  Service: Thoracic;  Laterality: N/A;   VIDEO BRONCHOSCOPY WITH ENDOBRONCHIAL NAVIGATION N/A 06/11/2018   Procedure: VIDEO BRONCHOSCOPY WITH ENDOBRONCHIAL NAVIGATION;  Surgeon: Delight Ovens, MD;  Location: [facility] OR;  Service: Thoracic;  Laterality: N/A;   Patient Active Problem List   Diagnosis Date Noted   Pain  from implanted hardware 05/10/2022   L1 vertebral fracture (HCC) 11/14/2021   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   Genetic testing 01/15/2019   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   Family history of colon cancer    Family history of stomach cancer    Lung cancer, 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HCC) 06/25/2018   Lung nodule 06/24/2018   Multiple thyroid nodules 01/15/2018   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  ONSET DATE: 04/15/23  SUBJECTIVE:                                                                                                                                                                                           SUBJECTIVE STATEMENT: Patient reports that he has had LBP since the first surgery below, had aquatic PT.  Had injections of the lumbar area without any relief with this.  The MD refers today for any pain relief, dry needling, ionto massage.    PERTINENT HISTORY:  November 2023 lumbar fusion, May 2025 removed some hardware, hx of lung cancer but is all clear at this time  PAIN:  Are you having pain? Yes: NPRS scale: 3/10 Pain location: low back primarily left Pain description: ache Aggravating factors: sitting, lying down, bending pain up to 6/10 Relieving factors: walking and moving, OTC pain med, heat at best pain a 2/10  PRECAUTIONS: None  RED FLAGS: None   WEIGHT BEARING RESTRICTIONS: No  FALLS:  Has patient fallen in last 6 months? No  LIVING ENVIRONMENT: Lives with: lives with their family Lives in: House/apartment Stairs: Yes: Internal: 15 steps; can reach both Has following equipment at home: None  OCCUPATION: retired  PLOF: Independent and walk 2 miles a day, some wood working, Social research officer, government  PATIENT GOALS: have less pain  NEXT MD VISIT: none scheduled  OBJECTIVE:  Note: Objective measures were completed at Evaluation unless otherwise noted.  DIAGNOSTIC FINDINGS:  IMPRESSION: 1. 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 2. 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  PATIENT SURVEYS:  Modified  Oswestry 56%   COGNITION: Overall cognitive status: Within functional limits for tasks assessed  SENSATION: WFL  MUSCLE LENGTH:  SLR very tight 40 degrees, very tight piriformis  POSTURE: rounded shoulders, forward head, and decreased lumbar lordosis  PALPATION: Very tight in the lumbar area, the piriformis, tender in the lumbar worse on the left  LUMBAR ROM:   AROM eval  Flexion Decreased 75%  Extension Decreased 75%  Right lateral flexion Decreased 75%  Left lateral flexion Decreased 75%  Right rotation   Left rotation    (Blank rows = not tested)  LOWER EXTREMITY ROM:   very tight HS and piriformis   LOWER EXTREMITY MMT:  4/5   LUMBAR SPECIAL TESTS:  Straight leg raise test: Positive 40 degrees  FUNCTIONAL TESTS:  Timed up and go (TUG): 19 seconds  GAIT: Distance walked: 50 feet Assistive device utilized: None Level of assistance: Complete Independence Comments: mild limp on the left is noticeable when he first gets up  TREATMENT DATE:  04/23/23     Trigger Point Dry Needling  Initial Treatment: Pt instructed on Dry Needling rational, procedures, and possible side effects. Pt instructed to expect mild to moderate muscle soreness later in the day and/or into the next day.  Pt instructed in methods to reduce muscle soreness. Pt instructed to continue prescribed HEP. Because Dry Needling was performed over or adjacent to a lung field, pt was educated on S/S of pneumothorax and to seek immediate medical attention should they occur.  Patient was educated on signs and symptoms of infection and other risk factors and advised to seek medical attention should they occur.  Patient verbalized understanding of these instructions and education.   Patient Verbal Consent Given: Yes Education Handout Provided: Yes Muscles Treated: left lumbar paraspinals Treatment Response/Outcome: LTR's STM to the left lumbar paraspinals in right sidelying                                                                                                                             PATIENT EDUCATION:  Education details: POC/HEP Person educated: Patient Education method: Explanation, Facilities manager, and Handouts Education comprehension: verbalized understanding  HOME EXERCISE PROGRAM: Access Code: [email or web address] URL: [email or web address] Date: 04/23/2023 Prepared by: Stacie Glaze  Exercises - Supine Piriformis Stretch Pulling Heel to Hip  - 2 x daily - 7 x weekly - 1 sets - 5 reps - 30 hold - Seated Hamstring Stretch with Chair  - 2 x daily - 7 x weekly - 1 sets - 5 reps - 30 hold  ASSESSMENT:  CLINICAL IMPRESSION: Patient is a 78 y.o. male who was seen today for physical therapy evaluation and treatment for LBP.  He has had two surgeries on the lumbar spine, has a fusion.  Reports that pain has never really improved, tried injections and aquatic PT.  HE is tight and tender int he left low back.  He has very limited lumbar ROM, he is extremely tight in the HS and the piriformis.  MD wanted him to try dry needling and some STM  to see if this could help pain levels   OBJECTIVE IMPAIRMENTS: Abnormal gait, cardiopulmonary status limiting activity, decreased activity tolerance, decreased balance, decreased endurance, decreased mobility, difficulty walking, decreased ROM, decreased strength, increased fascial restrictions, increased muscle spasms, impaired flexibility, improper body mechanics, postural dysfunction, and pain.  REHAB POTENTIAL: Good  CLINICAL DECISION MAKING: Stable/uncomplicated  EVALUATION COMPLEXITY: Low   GOALS: Goals reviewed with patient? Yes  SHORT TERM GOALS: Target date: 05/13/23  Independent with initial HEP Baseline: Goal status: INITIAL  LONG TERM GOALS: Target date: 07/23/23  Independent with advanced HEP Baseline:  Goal status: INITIAL  2.  Understand posture and body mechanics Baseline:  Goal status: INITIAL  3.   Decrease pain 25% Baseline:  Goal status: INITIAL  4.  Improve lumbar ROM 25% Baseline:  Goal status: INITIAL  5.  Improve SLR to 60 degrees Baseline:  Goal status: INITIAL  PLAN:  PT FREQUENCY: 1-2x/week  PT DURATION: 12 weeks  PLANNED INTERVENTIONS: 97164- PT Re-evaluation, 97110-Therapeutic exercises, 97530- Therapeutic activity, [id number]- Neuromuscular re-education, 97535- Self Care, [id number]- Manual therapy, [id number]- Electrical stimulation (unattended), [phone]- Ionotophoresis 4mg /ml Dexamethasone, Patient/Family education, Taping, Dry Needling, Cryotherapy, and Moist heat.  PLAN FOR NEXT SESSION: start some core and flexibility, see if DN helped any, could try STM and taping   Latayvia Mandujano W, PT 04/23/2023, 10:11 AM

## 2023-04-25 ENCOUNTER — Encounter: Payer: Self-pay | Admitting: Radiology

## 2023-04-25 ENCOUNTER — Ambulatory Visit
Admission: RE | Admit: 2023-04-25 | Discharge: 2023-04-25 | Disposition: A | Discharge status: Home / Self Care | Source: Ambulatory Visit | Attending: Family Medicine | Admitting: Family Medicine

## 2023-04-25 DIAGNOSIS — R1032 Left lower quadrant pain: Secondary | ICD-10-CM

## 2023-04-25 DIAGNOSIS — R102 Pelvic and perineal pain: Secondary | ICD-10-CM | POA: Diagnosis not present

## 2023-04-25 DIAGNOSIS — R1030 Lower abdominal pain, unspecified: Secondary | ICD-10-CM | POA: Diagnosis not present

## 2023-04-25 MED ORDER — IOPAMIDOL (ISOVUE-300) INJECTION 61%
100.0000 mL | Freq: Once | INTRAVENOUS | Status: AC | PRN
  Administered 2023-04-25: 100 mL via INTRAVENOUS

## 2023-05-01 ENCOUNTER — Other Ambulatory Visit: Payer: Self-pay

## 2023-05-01 ENCOUNTER — Ambulatory Visit

## 2023-05-01 DIAGNOSIS — M5459 Other low back pain: Secondary | ICD-10-CM

## 2023-05-01 DIAGNOSIS — M62830 Muscle spasm of back: Secondary | ICD-10-CM

## 2023-05-01 NOTE — Therapy (Signed)
 OUTPATIENT PHYSICAL THERAPY THORACOLUMBAR EVALUATION   Patient Name: Miguel Wolfe MRN: [id number] DOB:Aug 09, 1945, 78 y.o., male Today's Date: 05/01/2023  END OF SESSION:  PT End of Session - 05/01/23 1102     Visit Number 2    Date for PT Re-Evaluation 07/23/23    Authorization Type Medicare    Progress Note Due on Visit 10    PT Start Time [phone]    PT Stop Time 0930    PT Time Calculation (min) 43 min    Activity Tolerance Patient tolerated treatment well    Behavior During Therapy WFL for tasks assessed/performed              Past Medical History:  Diagnosis Date   Aortic insufficiency    Arthritis    CAD (coronary artery disease)    One Stent placed   Colon polyps    Dyspnea    With exertion related to 1/2 lung removed   Family history of adverse reaction to anesthesia    Mom vomits and Dad went "nuts"   Family history of colon cancer    Family history of stomach cancer    Fatigue    GERD (gastroesophageal reflux disease)    Hemorrhoids    History of echocardiogram    Echo 02/08/16: Mild LVH, EF 55-60, no RWMA, Gr 1 DD, trivial AI, mild LAE   Hyperlipidemia    Hypertension    Lung cancer (HCC)    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   PONV (postoperative nausea and vomiting)    Pre-diabetes    Pulmonary nodules    Thyroid  cancer (HCC)    thyroid  removed   Past Surgical History:  Procedure Laterality Date   BACK SURGERY     COLONOSCOPY WITH ESOPHAGOGASTRODUODENOSCOPY (EGD)  2008   CORONARY STENT INTERVENTION N/A 02/20/2016   Procedure: Coronary Stent Intervention;  Surgeon: Odie Benne, MD;  Location: [facility];  Service: Cardiovascular;  Laterality: N/A;   ESOPHAGOGASTRODUODENOSCOPY     HARDWARE REMOVAL Bilateral 05/10/2022   Procedure: REMOVAL OF THORACIC ELEVEN AND LUMBAR THREE PEDICLE SCREWS AND ROD SEGMENTS;  Surgeon: Augusto Blonder, MD;  Location: [facility] OR;  Service: Neurosurgery;  Laterality: Bilateral;  3C   INGUINAL HERNIA REPAIR Right  02/07/2022   Procedure: OPEN RIGHT INGUINAL HERNIA REPAIR WITH MESH;  Surgeon: Kinsinger, Alphonso Aschoff, MD;  Location: [facility];  Service: General;  Laterality: Right;   LEFT HEART CATH AND CORONARY ANGIOGRAPHY N/A 02/20/2016   Procedure: Left Heart Cath and Coronary Angiography;  Surgeon: Odie Benne, MD;  Location: [facility] INVASIVE CV LAB;  Service: Cardiovascular;  Laterality: N/A;   LUNG REMOVAL, PARTIAL Left 06/24/2018   Pt states "1/2 of lung was removed"   PILONIDAL CYST EXCISION     REFRACTIVE SURGERY Right    retina tear repair   THYROIDECTOMY N/A 01/17/2018   Procedure: TOTAL THYROIDECTOMY;  Surgeon: Oralee Billow, MD;  Location: [facility];  Service: General;  Laterality: N/A;   VIDEO ASSISTED THORACOSCOPY (VATS)/WEDGE RESECTION Left 06/24/2018   Procedure: VIDEO ASSISTED THORACOSCOPY (VATS)/LUNG RESECTION;  Surgeon: Norita Beauvais, MD;  Location: [facility] OR;  Service: Thoracic;  Laterality: Left;   VIDEO BRONCHOSCOPY WITH ENDOBRONCHIAL NAVIGATION N/A 11/18/2017   Procedure: VIDEO BRONCHOSCOPY WITH ENDOBRONCHIAL NAVIGATION WITH BIOPSIES OF LEFT AND RIGHT UPPER LOBES;  Surgeon: Norita Beauvais, MD;  Location: [facility] OR;  Service: Thoracic;  Laterality: N/A;   VIDEO BRONCHOSCOPY WITH ENDOBRONCHIAL NAVIGATION N/A 06/11/2018   Procedure: VIDEO BRONCHOSCOPY WITH ENDOBRONCHIAL NAVIGATION;  Surgeon: Norita Beauvais, MD;  Location: [facility] OR;  Service: Thoracic;  Laterality: N/A;   Patient Active Problem List   Diagnosis Date Noted   Pain from implanted hardware 05/10/2022   L1 vertebral fracture (HCC) 11/14/2021   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   Genetic testing 01/15/2019   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   Family history of colon cancer    Family history of stomach cancer    Lung cancer, 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HCC) 06/25/2018   Lung nodule 06/24/2018   Multiple thyroid  nodules 01/15/2018   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   Coronary  artery disease involving native coronary artery of native heart without angina pectoris 04/23/2016   Hyperlipidemia    GERD (gastroesophageal reflux disease)    Essential hypertension 02/16/2016    PCP: Janifer Meigs, MD  REFERRING PROVIDER: Dione Franks, PA  REFERRING DIAG: LBP  Rationale for Evaluation and Treatment: Rehabilitation  THERAPY DIAG:  Other low back pain  Muscle spasm of back  ONSET DATE: 04/15/23  SUBJECTIVE:                                                                                                                                                                                           SUBJECTIVE STATEMENT: 05/01/23: still pretty painful , having some difficulty with the seated hamstring stretch, doesn't feel like he can do it well.  Patient reports that he has had LBP since the first surgery below, had aquatic PT.  Had injections of the lumbar area without any relief with this.  The MD refers today for any pain relief, dry needling, ionto massage.    PERTINENT HISTORY:  November 2023 lumbar fusion, May 2025 removed some hardware, hx of lung cancer but is all clear at this time  PAIN:  Are you having pain? Yes: NPRS scale: 3/10 Pain location: low back primarily left Pain description: ache Aggravating factors: sitting, lying down, bending pain up to 6/10 Relieving factors: walking and moving, OTC pain med, heat at best pain a 2/10  PRECAUTIONS: None  RED FLAGS: None   WEIGHT BEARING RESTRICTIONS: No  FALLS:  Has patient fallen in last 6 months? No  LIVING ENVIRONMENT: Lives with: lives with their family Lives in: House/apartment Stairs: Yes: Internal: 15 steps; can reach both Has following equipment at home: None  OCCUPATION: retired  PLOF: Independent and walk 2 miles a day, some wood working, Social research officer, government  PATIENT GOALS: have less pain  NEXT MD VISIT: none scheduled  OBJECTIVE:  Note: Objective measures were completed at Evaluation unless otherwise  noted.  DIAGNOSTIC FINDINGS:  IMPRESSION:  1. 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 2. 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  PATIENT SURVEYS:  Modified Oswestry 56%   COGNITION: Overall cognitive status: Within functional limits for tasks assessed     SENSATION: WFL  MUSCLE LENGTH:  SLR very tight 40 degrees, very tight piriformis  POSTURE: rounded shoulders, forward head, and decreased lumbar lordosis  PALPATION: Very tight in the lumbar area, the piriformis, tender in the lumbar worse on the left  LUMBAR ROM:   AROM eval  Flexion Decreased 75%  Extension Decreased 75%  Right lateral flexion Decreased 75%  Left lateral flexion Decreased 75%  Right rotation   Left rotation    (Blank rows = not tested)  LOWER EXTREMITY ROM:   very tight HS and piriformis   LOWER EXTREMITY MMT:  4/5   LUMBAR SPECIAL TESTS:  Straight leg raise test: Positive 40 degrees  FUNCTIONAL TESTS:  Timed up and go (TUG): 19 seconds  GAIT: Distance walked: 50 feet Assistive device utilized: None Level of assistance: Complete Independence Comments: mild limp on the left is noticeable when he first gets up  TREATMENT DATE:  05/01/23: Manual: all in L side lying:  Trigger Point Dry Needling  Subsequent Treatment: Instructions provided previously at initial dry needling treatment.  Instructions reviewed, if requested by the patient, prior to subsequent dry needling treatment.   Patient Verbal Consent Given: Yes Education Handout Provided: Previously Provided Muscles Treated: B lumbar paraspinals, R gluteus maximus Electrical Stimulation Performed: No Treatment Response/Outcome: twitch response lumbar parspinals  Theragun massage B paraspinals and gluts while pt in L side lying  Supine for IFC, for pain management with moist heat lower back  for 12 min for trial , pt has TENS unit but wanted to compare IFC with out electric unit to home   While in supine the pt was instructed in hamstring stretch , using yoga strap around forefoot, as alternative to seated stretch. He tolerated well.    04/23/23     Trigger Point Dry Needling  Initial Treatment: Pt instructed on Dry Needling rational, procedures, and possible side effects. Pt instructed to expect mild to moderate muscle soreness later in the day and/or into the next day.  Pt instructed in methods to reduce muscle soreness. Pt instructed to continue prescribed HEP. Because Dry Needling was performed over or adjacent to a lung field, pt was educated on S/S of pneumothorax and to seek immediate medical attention should they occur.  Patient was educated on signs and symptoms of infection and other risk factors and advised to seek medical attention should they occur.  Patient verbalized understanding of these instructions and education.   Patient Verbal Consent Given: Yes Education Handout Provided: Yes Muscles Treated: left lumbar paraspinals Treatment Response/Outcome: LTR's STM to the left lumbar paraspinals in right sidelying  PATIENT EDUCATION:  Education details: POC/HEP Person educated: Patient Education method: Explanation, Facilities manager, and Handouts Education comprehension: verbalized understanding  HOME EXERCISE PROGRAM: Access Code: [email or web address] URL: [email or web address] Date: 04/23/2023 Prepared by: Cherylene Corrente  Exercises - Supine Piriformis Stretch Pulling Heel to Hip  - 2 x daily - 7 x weekly - 1 sets - 5 reps - 30 hold - Seated Hamstring Stretch with Chair  - 2 x daily - 7 x weekly - 1 sets - 5 reps - 30 hold  ASSESSMENT:  CLINICAL IMPRESSION:05/01/23:  Today was pts second skilled PT session to address his chronic  lower back pain.continued with TPDN, deep myofascial release, massage.  Also adapted/changed hamstring stretch.  Will continue to evaluate his response to today's Rx next session and adapt as appropriate.   Eval;Patient is a 78 y.o. male who was seen today for physical therapy evaluation and treatment for LBP.  He has had two surgeries on the lumbar spine, has a fusion.  Reports that pain has never really improved, tried injections and aquatic PT.  HE is tight and tender int he left low back.  He has very limited lumbar ROM, he is extremely tight in the HS and the piriformis.  MD wanted him to try dry needling and some STM to see if this could help pain levels   OBJECTIVE IMPAIRMENTS: Abnormal gait, cardiopulmonary status limiting activity, decreased activity tolerance, decreased balance, decreased endurance, decreased mobility, difficulty walking, decreased ROM, decreased strength, increased fascial restrictions, increased muscle spasms, impaired flexibility, improper body mechanics, postural dysfunction, and pain.  REHAB POTENTIAL: Good  CLINICAL DECISION MAKING: Stable/uncomplicated  EVALUATION COMPLEXITY: Low   GOALS: Goals reviewed with patient? Yes  SHORT TERM GOALS: Target date: 05/13/23  Independent with initial HEP Baseline: Goal status: INITIAL  LONG TERM GOALS: Target date: 07/23/23  Independent with advanced HEP Baseline:  Goal status: INITIAL  2.  Understand posture and body mechanics Baseline:  Goal status: INITIAL  3.  Decrease pain 25% Baseline:  Goal status: INITIAL  4.  Improve lumbar ROM 25% Baseline:  Goal status: INITIAL  5.  Improve SLR to 60 degrees Baseline:  Goal status: INITIAL  PLAN:  PT FREQUENCY: 1-2x/week  PT DURATION: 12 weeks  PLANNED INTERVENTIONS: 97164- PT Re-evaluation, 97110-Therapeutic exercises, 97530- Therapeutic activity, [id number]- Neuromuscular re-education, 97535- Self Care, 28413- Manual therapy, G0283- Electrical stimulation  (unattended), [phone]- Ionotophoresis 4mg /ml Dexamethasone , Patient/Family education, Taping, Dry Needling, Cryotherapy, and Moist heat.  PLAN FOR NEXT SESSION: start some core and flexibility, see if DN helped any, could try STM and taping   Kaari Zeigler L Maui Britten, PT, DPT, OCS 05/01/2023, 11:53 AM

## 2023-05-01 NOTE — Therapy (Deleted)
 OUTPATIENT PHYSICAL THERAPY THORACOLUMBAR EVALUATION   Patient Name: Miguel Wolfe MRN: [id number] DOB:Oct 25, 1945, 78 y.o., male Today's Date: 05/01/2023  END OF SESSION:    Past Medical History:  Diagnosis Date   Aortic insufficiency    Arthritis    CAD (coronary artery disease)    One Stent placed   Colon polyps    Dyspnea    With exertion related to 1/2 lung removed   Family history of adverse reaction to anesthesia    Mom vomits and Dad went "nuts"   Family history of colon cancer    Family history of stomach cancer    Fatigue    GERD (gastroesophageal reflux disease)    Hemorrhoids    History of echocardiogram    Echo 02/08/16: Mild LVH, EF 55-60, no RWMA, Gr 1 DD, trivial AI, mild LAE   Hyperlipidemia    Hypertension    Lung cancer (HCC)    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   PONV (postoperative nausea and vomiting)    Pre-diabetes    Pulmonary nodules    Thyroid  cancer (HCC)    thyroid  removed   Past Surgical History:  Procedure Laterality Date   BACK SURGERY     COLONOSCOPY WITH ESOPHAGOGASTRODUODENOSCOPY (EGD)  2008   CORONARY STENT INTERVENTION N/A 02/20/2016   Procedure: Coronary Stent Intervention;  Surgeon: Odie Benne, MD;  Location: [facility];  Service: Cardiovascular;  Laterality: N/A;   ESOPHAGOGASTRODUODENOSCOPY     HARDWARE REMOVAL Bilateral 05/10/2022   Procedure: REMOVAL OF THORACIC ELEVEN AND LUMBAR THREE PEDICLE SCREWS AND ROD SEGMENTS;  Surgeon: Augusto Blonder, MD;  Location: [facility] OR;  Service: Neurosurgery;  Laterality: Bilateral;  3C   INGUINAL HERNIA REPAIR Right 02/07/2022   Procedure: OPEN RIGHT INGUINAL HERNIA REPAIR WITH MESH;  Surgeon: Kinsinger, Alphonso Aschoff, MD;  Location: [facility];  Service: General;  Laterality: Right;   LEFT HEART CATH AND CORONARY ANGIOGRAPHY N/A 02/20/2016   Procedure: Left Heart Cath and Coronary Angiography;  Surgeon: Odie Benne, MD;  Location: [facility] INVASIVE CV LAB;  Service: Cardiovascular;   Laterality: N/A;   LUNG REMOVAL, PARTIAL Left 06/24/2018   Pt states "1/2 of lung was removed"   PILONIDAL CYST EXCISION     REFRACTIVE SURGERY Right    retina tear repair   THYROIDECTOMY N/A 01/17/2018   Procedure: TOTAL THYROIDECTOMY;  Surgeon: Oralee Billow, MD;  Location: [facility];  Service: General;  Laterality: N/A;   VIDEO ASSISTED THORACOSCOPY (VATS)/WEDGE RESECTION Left 06/24/2018   Procedure: VIDEO ASSISTED THORACOSCOPY (VATS)/LUNG RESECTION;  Surgeon: Norita Beauvais, MD;  Location: [facility] OR;  Service: Thoracic;  Laterality: Left;   VIDEO BRONCHOSCOPY WITH ENDOBRONCHIAL NAVIGATION N/A 11/18/2017   Procedure: VIDEO BRONCHOSCOPY WITH ENDOBRONCHIAL NAVIGATION WITH BIOPSIES OF LEFT AND RIGHT UPPER LOBES;  Surgeon: Norita Beauvais, MD;  Location: [facility] OR;  Service: Thoracic;  Laterality: N/A;   VIDEO BRONCHOSCOPY WITH ENDOBRONCHIAL NAVIGATION N/A 06/11/2018   Procedure: VIDEO BRONCHOSCOPY WITH ENDOBRONCHIAL NAVIGATION;  Surgeon: Norita Beauvais, MD;  Location: [facility] OR;  Service: Thoracic;  Laterality: N/A;   Patient Active Problem List   Diagnosis Date Noted   Pain from implanted hardware 05/10/2022   L1 vertebral fracture (HCC) 11/14/2021   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   Genetic testing 01/15/2019   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   Family history of colon cancer    Family history of stomach cancer    Lung cancer, 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HCC) 06/25/2018   Lung  nodule 06/24/2018   Multiple thyroid  nodules 01/15/2018   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  ONSET DATE: 04/15/23  SUBJECTIVE:                                                                                                                                                                                           SUBJECTIVE STATEMENT: Patient reports that he has had LBP since the first surgery below, had aquatic PT.  Had injections of the lumbar area without any relief with this.  The MD refers today for any pain relief, dry needling, ionto massage.    PERTINENT HISTORY:  November 2023 lumbar fusion, May 2025 removed some hardware, hx of lung cancer but is all clear at this time  PAIN:  Are you having pain? Yes: NPRS scale: 3/10 Pain location: low back primarily left Pain description: ache Aggravating factors: sitting, lying down, bending pain up to 6/10 Relieving factors: walking and moving, OTC pain med, heat at best pain a 2/10  PRECAUTIONS: None  RED FLAGS: None   WEIGHT BEARING RESTRICTIONS: No  FALLS:  Has patient fallen in last 6 months? No  LIVING ENVIRONMENT: Lives with: lives with their family Lives in: House/apartment Stairs: Yes: Internal: 15 steps; can reach both Has following equipment at home: None  OCCUPATION: retired  PLOF: Independent and walk 2 miles a day, some wood working, Social research officer, government  PATIENT GOALS: have less pain  NEXT MD VISIT: none scheduled  OBJECTIVE:  Note: Objective measures were completed at Evaluation unless otherwise noted.  DIAGNOSTIC FINDINGS:  IMPRESSION: 1. 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 2. 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  PATIENT SURVEYS:  Modified Oswestry 56%   COGNITION: Overall cognitive status: Within functional limits for tasks assessed     SENSATION: WFL  MUSCLE LENGTH:  SLR very tight 40 degrees, very tight piriformis  POSTURE: rounded  shoulders, forward head, and decreased lumbar lordosis  PALPATION: Very tight in the lumbar area, the piriformis, tender in the lumbar worse on the left  LUMBAR ROM:   AROM eval  Flexion Decreased 75%  Extension Decreased 75%  Right lateral flexion  Decreased 75%  Left lateral flexion Decreased 75%  Right rotation   Left rotation    (Blank rows = not tested)  LOWER EXTREMITY ROM:   very tight HS and piriformis   LOWER EXTREMITY MMT:  4/5   LUMBAR SPECIAL TESTS:  Straight leg raise test: Positive 40 degrees  FUNCTIONAL TESTS:  Timed up and go (TUG): 19 seconds  GAIT: Distance walked: 50 feet Assistive device utilized: None Level of assistance: Complete Independence Comments: mild limp on the left is noticeable when he first gets up  TREATMENT DATE:  04/23/23     Trigger Point Dry Needling  Initial Treatment: Pt instructed on Dry Needling rational, procedures, and possible side effects. Pt instructed to expect mild to moderate muscle soreness later in the day and/or into the next day.  Pt instructed in methods to reduce muscle soreness. Pt instructed to continue prescribed HEP. Because Dry Needling was performed over or adjacent to a lung field, pt was educated on S/S of pneumothorax and to seek immediate medical attention should they occur.  Patient was educated on signs and symptoms of infection and other risk factors and advised to seek medical attention should they occur.  Patient verbalized understanding of these instructions and education.   Patient Verbal Consent Given: Yes Education Handout Provided: Yes Muscles Treated: left lumbar paraspinals Treatment Response/Outcome: LTR's STM to the left lumbar paraspinals in right sidelying                                                                                                                            PATIENT EDUCATION:  Education details: POC/HEP Person educated: Patient Education method: Explanation,  Facilities manager, and Handouts Education comprehension: verbalized understanding  HOME EXERCISE PROGRAM: Access Code: [email or web address] URL: [email or web address] Date: 04/23/2023 Prepared by: Cherylene Corrente  Exercises - Supine Piriformis Stretch Pulling Heel to Hip  - 2 x daily - 7 x weekly - 1 sets - 5 reps - 30 hold - Seated Hamstring Stretch with Chair  - 2 x daily - 7 x weekly - 1 sets - 5 reps - 30 hold  ASSESSMENT:  CLINICAL IMPRESSION: Patient is a 78 y.o. male who was seen today for physical therapy evaluation and treatment for LBP.  He has had two surgeries on the lumbar spine, has a fusion.  Reports that pain has never really improved, tried injections and aquatic PT.  HE is tight and tender int he left low back.  He has very limited lumbar ROM, he is extremely tight in the HS and the piriformis.  MD wanted him to try dry needling and some STM to see if this could help pain levels   OBJECTIVE IMPAIRMENTS: Abnormal gait, cardiopulmonary status limiting activity, decreased activity tolerance, decreased balance, decreased endurance, decreased mobility, difficulty walking, decreased ROM, decreased strength, increased fascial restrictions, increased muscle spasms, impaired flexibility, improper body mechanics, postural dysfunction, and pain.  REHAB POTENTIAL: Good  CLINICAL DECISION MAKING: Stable/uncomplicated  EVALUATION COMPLEXITY: Low  GOALS: Goals reviewed with patient? Yes  SHORT TERM GOALS: Target date: 05/13/23  Independent with initial HEP Baseline: Goal status: INITIAL  LONG TERM GOALS: Target date: 07/23/23  Independent with advanced HEP Baseline:  Goal status: INITIAL  2.  Understand posture and body mechanics Baseline:  Goal status: INITIAL  3.  Decrease pain 25% Baseline:  Goal status: INITIAL  4.  Improve lumbar ROM 25% Baseline:  Goal status: INITIAL  5.  Improve SLR to 60 degrees Baseline:  Goal status: INITIAL  PLAN:  PT FREQUENCY:  1-2x/week  PT DURATION: 12 weeks  PLANNED INTERVENTIONS: 97164- PT Re-evaluation, 97110-Therapeutic exercises, 97530- Therapeutic activity, [id number]- Neuromuscular re-education, 97535- Self Care, 78469- Manual therapy, G0283- Electrical stimulation (unattended), 62952- Ionotophoresis 4mg /ml Dexamethasone , Patient/Family education, Taping, Dry Needling, Cryotherapy, and Moist heat.  PLAN FOR NEXT SESSION: start some core and flexibility, see if DN helped any, could try STM and taping   Andoni Busch L Abryana Lykens, PT 05/01/2023, 11:01 AM

## 2023-05-02 DIAGNOSIS — I10 Essential (primary) hypertension: Secondary | ICD-10-CM | POA: Diagnosis not present

## 2023-05-02 DIAGNOSIS — I25111 Atherosclerotic heart disease of native coronary artery with angina pectoris with documented spasm: Secondary | ICD-10-CM | POA: Diagnosis not present

## 2023-05-06 NOTE — Addendum Note (Signed)
 Addended by: Hollis Lurie on: 05/06/2023 10:49 AM   Modules accepted: Orders

## 2023-05-08 DIAGNOSIS — E890 Postprocedural hypothyroidism: Secondary | ICD-10-CM | POA: Diagnosis not present

## 2023-05-08 DIAGNOSIS — I10 Essential (primary) hypertension: Secondary | ICD-10-CM | POA: Diagnosis not present

## 2023-05-08 DIAGNOSIS — I25111 Atherosclerotic heart disease of native coronary artery with angina pectoris with documented spasm: Secondary | ICD-10-CM | POA: Diagnosis not present

## 2023-05-08 DIAGNOSIS — E782 Mixed hyperlipidemia: Secondary | ICD-10-CM | POA: Diagnosis not present

## 2023-05-08 DIAGNOSIS — Z85118 Personal history of other malignant neoplasm of bronchus and lung: Secondary | ICD-10-CM | POA: Diagnosis not present

## 2023-05-09 ENCOUNTER — Ambulatory Visit: Admitting: Physical Therapy

## 2023-05-10 DIAGNOSIS — L661 Lichen planopilaris, unspecified: Secondary | ICD-10-CM | POA: Diagnosis not present

## 2023-05-14 ENCOUNTER — Encounter: Payer: Self-pay | Admitting: Physical Therapy

## 2023-05-14 ENCOUNTER — Ambulatory Visit: Attending: Neurosurgery | Admitting: Physical Therapy

## 2023-05-14 DIAGNOSIS — M5459 Other low back pain: Secondary | ICD-10-CM

## 2023-05-14 DIAGNOSIS — M62830 Muscle spasm of back: Secondary | ICD-10-CM | POA: Diagnosis not present

## 2023-05-14 NOTE — Therapy (Signed)
 OUTPATIENT PHYSICAL THERAPY THORACOLUMBAR EVALUATION   Patient Name: Miguel Wolfe MRN: [id number] DOB:11/14/1945, 78 y.o., male Today's Date: 05/14/2023  END OF SESSION:  PT End of Session - 05/14/23 0936     Visit Number 3    Date for PT Re-Evaluation 07/23/23    Authorization Type Medicare    PT Start Time 0930    PT Stop Time 1030    PT Time Calculation (min) 60 min    Activity Tolerance Patient tolerated treatment well    Behavior During Therapy WFL for tasks assessed/performed              Past Medical History:  Diagnosis Date   Aortic insufficiency    Arthritis    CAD (coronary artery disease)    One Stent placed   Colon polyps    Dyspnea    With exertion related to 1/2 lung removed   Family history of adverse reaction to anesthesia    Mom vomits and Dad went "nuts"   Family history of colon cancer    Family history of stomach cancer    Fatigue    GERD (gastroesophageal reflux disease)    Hemorrhoids    History of echocardiogram    Echo 02/08/16: Mild LVH, EF 55-60, no RWMA, Gr 1 DD, trivial AI, mild LAE   Hyperlipidemia    Hypertension    Lung cancer (HCC)    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   PONV (postoperative nausea and vomiting)    Pre-diabetes    Pulmonary nodules    Thyroid  cancer (HCC)    thyroid  removed   Past Surgical History:  Procedure Laterality Date   BACK SURGERY     COLONOSCOPY WITH ESOPHAGOGASTRODUODENOSCOPY (EGD)  2008   CORONARY STENT INTERVENTION N/A 02/20/2016   Procedure: Coronary Stent Intervention;  Surgeon: Odie Benne, MD;  Location: [facility];  Service: Cardiovascular;  Laterality: N/A;   ESOPHAGOGASTRODUODENOSCOPY     HARDWARE REMOVAL Bilateral 05/10/2022   Procedure: REMOVAL OF THORACIC ELEVEN AND LUMBAR THREE PEDICLE SCREWS AND ROD SEGMENTS;  Surgeon: Augusto Blonder, MD;  Location: [facility] OR;  Service: Neurosurgery;  Laterality: Bilateral;  3C   INGUINAL HERNIA REPAIR Right 02/07/2022   Procedure: OPEN RIGHT INGUINAL  HERNIA REPAIR WITH MESH;  Surgeon: Kinsinger, Alphonso Aschoff, MD;  Location: [facility];  Service: General;  Laterality: Right;   LEFT HEART CATH AND CORONARY ANGIOGRAPHY N/A 02/20/2016   Procedure: Left Heart Cath and Coronary Angiography;  Surgeon: Odie Benne, MD;  Location: [facility] INVASIVE CV [facility];  Service: Cardiovascular;  Laterality: N/A;   LUNG REMOVAL, PARTIAL Left 06/24/2018   Pt states "1/2 of lung was removed"   PILONIDAL CYST EXCISION     REFRACTIVE SURGERY Right    retina tear repair   THYROIDECTOMY N/A 01/17/2018   Procedure: TOTAL THYROIDECTOMY;  Surgeon: Oralee Billow, MD;  Location: [facility];  Service: General;  Laterality: N/A;   VIDEO ASSISTED THORACOSCOPY (VATS)/WEDGE RESECTION Left 06/24/2018   Procedure: VIDEO ASSISTED THORACOSCOPY (VATS)/LUNG RESECTION;  Surgeon: Norita Beauvais, MD;  Location: [facility] OR;  Service: Thoracic;  Laterality: Left;   VIDEO BRONCHOSCOPY WITH ENDOBRONCHIAL NAVIGATION N/A 11/18/2017   Procedure: VIDEO BRONCHOSCOPY WITH ENDOBRONCHIAL NAVIGATION WITH BIOPSIES OF LEFT AND RIGHT UPPER LOBES;  Surgeon: Norita Beauvais, MD;  Location: [facility] OR;  Service: Thoracic;  Laterality: N/A;   VIDEO BRONCHOSCOPY WITH ENDOBRONCHIAL NAVIGATION N/A 06/11/2018   Procedure: VIDEO BRONCHOSCOPY WITH ENDOBRONCHIAL NAVIGATION;  Surgeon: Norita Beauvais, MD;  Location: [facility] OR;  Service: Thoracic;  Laterality: N/A;   Patient Active Problem List   Diagnosis Date Noted   Pain from implanted hardware 05/10/2022   L1 vertebral fracture (HCC) 11/14/2021   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   Genetic testing 01/15/2019   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   Family history of colon cancer    Family history of stomach cancer    Lung cancer, 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HCC) 06/25/2018   Lung nodule 06/24/2018   Multiple thyroid  nodules 01/15/2018   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   Coronary artery disease involving native coronary artery  of native heart without angina pectoris 04/23/2016   Hyperlipidemia    GERD (gastroesophageal reflux disease)    Essential hypertension 02/16/2016    PCP: Janifer Meigs, MD  REFERRING PROVIDER: Dione Franks, PA  REFERRING DIAG: LBP  Rationale for Evaluation and Treatment: Rehabilitation  THERAPY DIAG:  Other low back pain  Muscle spasm of back  ONSET DATE: 04/15/23  SUBJECTIVE:                                                                                                                                                                                           SUBJECTIVE STATEMENT: Patient reports that he feels like the last treatment helped a little  Patient reports that he has had LBP since the first surgery below, had aquatic PT.  Had injections of the lumbar area without any relief with this.  The MD refers today for any pain relief, dry needling, ionto massage.    PERTINENT HISTORY:  November 2023 lumbar fusion, May 2025 removed some hardware, hx of lung cancer but is all clear at this time  PAIN:  Are you having pain? Yes: NPRS scale: 3/10 Pain location: low back primarily left Pain description: ache Aggravating factors: sitting, lying down, bending pain up to 6/10 Relieving factors: walking and moving, OTC pain med, heat at best pain a 2/10  PRECAUTIONS: None  RED FLAGS: None   WEIGHT BEARING RESTRICTIONS: No  FALLS:  Has patient fallen in last 6 months? No  LIVING ENVIRONMENT: Lives with: lives with their family Lives in: House/apartment Stairs: Yes: Internal: 15 steps; can reach both Has following equipment at home: None  OCCUPATION: retired  PLOF: Independent and walk 2 miles a day, some wood working, Social research officer, government  PATIENT GOALS: have less pain  NEXT MD VISIT: none scheduled  OBJECTIVE:  Note: Objective measures were completed at Evaluation unless otherwise noted.  DIAGNOSTIC FINDINGS:  IMPRESSION: 1. Unchanged appearance of the biopsy-proven L1  hemangioma complicated by chronic pathologic fracture. 2. 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  PATIENT SURVEYS:  Modified Oswestry 56%   COGNITION: Overall cognitive status: Within functional limits for tasks assessed     SENSATION: WFL  MUSCLE LENGTH:  SLR very tight 40 degrees, very tight piriformis  POSTURE: rounded shoulders, forward head, and decreased lumbar lordosis  PALPATION: Very tight in the lumbar area, the piriformis, tender in the lumbar worse on the left  LUMBAR ROM:   AROM eval  Flexion Decreased 75%  Extension Decreased 75%  Right lateral flexion Decreased 75%  Left lateral flexion Decreased 75%  Right rotation   Left rotation    (Blank rows = not tested)  LOWER EXTREMITY ROM:   very tight HS and piriformis   LOWER EXTREMITY MMT:  4/5   LUMBAR SPECIAL TESTS:  Straight leg raise test: Positive 40 degrees  FUNCTIONAL TESTS:  Timed up and go (TUG): 19 seconds  GAIT: Distance walked: 50 feet Assistive device utilized: None Level of assistance: Complete Independence Comments: mild limp on the left is noticeable when he first gets up  TREATMENT DATE:  05/14/23 Nustep level 5 x 6 minutes Seated ball roll outs Feet on ball K2C, rotation, small bridge, isometric abs Passive stretch HS and piriformis, very tight and resistant to the good stretch STM to the bilateral L-p-spinals MHP/IFC to the Lumbar area in sitting  05/01/23: Manual: all in L side lying:  Trigger Point Dry Needling  Subsequent Treatment: Instructions provided previously at initial dry needling treatment.  Instructions reviewed, if requested by the patient, prior to subsequent dry needling treatment.   Patient Verbal Consent Given: Yes Education Handout Provided: Previously Provided Muscles Treated: B lumbar paraspinals, R gluteus maximus Electrical Stimulation Performed: No Treatment  Response/Outcome: twitch response lumbar parspinals  Theragun massage B paraspinals and gluts while pt in L side lying  Supine for IFC, for pain management with moist heat lower back for 12 min for trial , pt has TENS unit but wanted to compare IFC with out electric unit to home   While in supine the pt was instructed in hamstring stretch , using yoga strap around forefoot, as alternative to seated stretch. He tolerated well.    04/23/23     Trigger Point Dry Needling  Initial Treatment: Pt instructed on Dry Needling rational, procedures, and possible side effects. Pt instructed to expect mild to moderate muscle soreness later in the day and/or into the next day.  Pt instructed in methods to reduce muscle soreness. Pt instructed to continue prescribed HEP. Because Dry Needling was performed over or adjacent to a lung field, pt was educated on S/S of pneumothorax and to seek immediate medical attention should they occur.  Patient was educated on signs and symptoms of infection and other risk factors and advised to seek medical attention should they occur.  Patient verbalized understanding of these instructions and education.   Patient Verbal Consent Given: Yes Education Handout Provided: Yes Muscles Treated: left lumbar paraspinals Treatment Response/Outcome: LTR's STM to the left lumbar paraspinals in right sidelying  PATIENT EDUCATION:  Education details: POC/HEP Person educated: Patient Education method: Explanation, Facilities manager, and Handouts Education comprehension: verbalized understanding  HOME EXERCISE PROGRAM: Access Code: [email or web address] URL: [email or web address] Date: 04/23/2023 Prepared by: Cherylene Corrente  Exercises - Supine Piriformis Stretch Pulling Heel to Hip  - 2 x daily - 7 x weekly - 1 sets - 5 reps - 30 hold - Seated Hamstring  Stretch with Chair  - 2 x daily - 7 x weekly - 1 sets - 5 reps - 30 hold  ASSESSMENT:  CLINICAL IMPRESSION:05/01/23:  Patient very tight in the HS and piriformis and does not tolerate stretching very well.  He is mostly non tender in the lumbar area, feels like the STM and the heat and estim helps  Eval;Patient is a 78 y.o. male who was seen today for physical therapy evaluation and treatment for LBP.  He has had two surgeries on the lumbar spine, has a fusion.  Reports that pain has never really improved, tried injections and aquatic PT.  HE is tight and tender int he left low back.  He has very limited lumbar ROM, he is extremely tight in the HS and the piriformis.  MD wanted him to try dry needling and some STM to see if this could help pain levels   OBJECTIVE IMPAIRMENTS: Abnormal gait, cardiopulmonary status limiting activity, decreased activity tolerance, decreased balance, decreased endurance, decreased mobility, difficulty walking, decreased ROM, decreased strength, increased fascial restrictions, increased muscle spasms, impaired flexibility, improper body mechanics, postural dysfunction, and pain.  REHAB POTENTIAL: Good  CLINICAL DECISION MAKING: Stable/uncomplicated  EVALUATION COMPLEXITY: Low   GOALS: Goals reviewed with patient? Yes  SHORT TERM GOALS: Target date: 05/13/23  Independent with initial HEP Baseline: Goal status: met 05/14/23  LONG TERM GOALS: Target date: 07/23/23  Independent with advanced HEP Baseline:  Goal status: INITIAL  2.  Understand posture and body mechanics Baseline:  Goal status: INITIAL  3.  Decrease pain 25% Baseline:  Goal status: INITIAL  4.  Improve lumbar ROM 25% Baseline:  Goal status: INITIAL  5.  Improve SLR to 60 degrees Baseline:  Goal status: INITIAL  PLAN:  PT FREQUENCY: 1-2x/week  PT DURATION: 12 weeks  PLANNED INTERVENTIONS: 97164- PT Re-evaluation, 97110-Therapeutic exercises, 97530- Therapeutic activity, [id number]  Neuromuscular re-education, 97535- Self Care, [id number]- Manual therapy, [id number]- Electrical stimulation (unattended), [id number]- Ionotophoresis 4mg /ml Dexamethasone , Patient/Family education, Taping, Dry Needling, Cryotherapy, and Moist heat.  PLAN FOR NEXT SESSION: start some core and flexibility, see if DN helped any, could try STM and taping   Hollis Lurie, PT, DPT, OCS 05/14/2023, 10:18 AM

## 2023-05-16 ENCOUNTER — Ambulatory Visit: Admitting: Physical Therapy

## 2023-05-22 ENCOUNTER — Ambulatory Visit

## 2023-05-22 ENCOUNTER — Other Ambulatory Visit: Payer: Self-pay

## 2023-05-22 DIAGNOSIS — M62830 Muscle spasm of back: Secondary | ICD-10-CM | POA: Diagnosis not present

## 2023-05-22 DIAGNOSIS — M5459 Other low back pain: Secondary | ICD-10-CM | POA: Diagnosis not present

## 2023-05-22 NOTE — Therapy (Signed)
 OUTPATIENT PHYSICAL THERAPY THORACOLUMBAR EVALUATION   Patient Name: KATHY MARDER MRN: [id number] DOB:29-Jun-1945, 78 y.o., male Today's Date: 05/22/2023  END OF SESSION:  PT End of Session - 05/22/23 1110     Visit Number 4    Date for PT Re-Evaluation 07/23/23    Authorization Type Medicare    Progress Note Due on Visit 10    PT Start Time 0802    PT Stop Time 0845    PT Time Calculation (min) 43 min    Activity Tolerance Patient tolerated treatment well    Behavior During Therapy WFL for tasks assessed/performed               Past Medical History:  Diagnosis Date   Aortic insufficiency    Arthritis    CAD (coronary artery disease)    One Stent placed   Colon polyps    Dyspnea    With exertion related to 1/2 lung removed   Family history of adverse reaction to anesthesia    Mom vomits and Dad went "nuts"   Family history of colon cancer    Family history of stomach cancer    Fatigue    GERD (gastroesophageal reflux disease)    Hemorrhoids    History of echocardiogram    Echo 02/08/16: Mild LVH, EF 55-60, no RWMA, Gr 1 DD, trivial AI, mild LAE   Hyperlipidemia    Hypertension    Lung cancer (HCC)    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   PONV (postoperative nausea and vomiting)    Pre-diabetes    Pulmonary nodules    Thyroid  cancer (HCC)    thyroid  removed   Past Surgical History:  Procedure Laterality Date   BACK SURGERY     COLONOSCOPY WITH ESOPHAGOGASTRODUODENOSCOPY (EGD)  2008   CORONARY STENT INTERVENTION N/A 02/20/2016   Procedure: Coronary Stent Intervention;  Surgeon: Odie Benne, MD;  Location: [facility];  Service: Cardiovascular;  Laterality: N/A;   ESOPHAGOGASTRODUODENOSCOPY     HARDWARE REMOVAL Bilateral 05/10/2022   Procedure: REMOVAL OF THORACIC ELEVEN AND LUMBAR THREE PEDICLE SCREWS AND ROD SEGMENTS;  Surgeon: Augusto Blonder, MD;  Location: [facility] OR;  Service: Neurosurgery;  Laterality: Bilateral;  3C   INGUINAL HERNIA REPAIR Right  02/07/2022   Procedure: OPEN RIGHT INGUINAL HERNIA REPAIR WITH MESH;  Surgeon: Kinsinger, Alphonso Aschoff, MD;  Location: [facility];  Service: General;  Laterality: Right;   LEFT HEART CATH AND CORONARY ANGIOGRAPHY N/A 02/20/2016   Procedure: Left Heart Cath and Coronary Angiography;  Surgeon: Odie Benne, MD;  Location: [facility] INV[facility] CV [facility];  Service: Cardiovascular;  Laterality: N/A;   LUNG REMOVAL, PARTIAL Left 06/24/2018   Pt states "1/2 of lung was removed"   PILONIDAL CYST EXCISION     REFRACTIVE SURGERY Right    retina tear repair   THYROIDECTOMY N/A 01/17/2018   Procedure: TOTAL THYROIDECTOMY;  Surgeon: Oralee Billow, MD;  Location: [facility];  Service: General;  Laterality: N/A;   VIDEO ASSISTED THORACOSCOPY (VATS)/WEDGE RESECTION Left 06/24/2018   Procedure: VIDEO ASSISTED THORACOSCOPY (VATS)/LUNG RESECTION;  Surgeon: Norita Beauvais, MD;  Location: [facility] OR;  Service: Thoracic;  Laterality: Left;   VIDEO BRONCHOSCOPY WITH ENDOBRONCHIAL NAVIGATION N/A 11/18/2017   Procedure: VIDEO BRONCHOSCOPY WITH ENDOBRONCHIAL NAVIGATION WITH BIOPSIES OF LEFT AND RIGHT UPPER LOBES;  Surgeon: Norita Beauvais, MD;  Location: [facility] OR;  Service: Thoracic;  Laterality: N/A;   VIDEO BRONCHOSCOPY WITH ENDOBRONCHIAL NAVIGATION N/A 06/11/2018   Procedure: VIDEO BRONCHOSCOPY WITH ENDOBRONCHIAL NAVIGATION;  Surgeon: Norita Beauvais, MD;  Location: [facility] OR;  Service: Thoracic;  Laterality: N/A;   Patient Active Problem List   Diagnosis Date Noted   Pain from implanted hardware 05/10/2022   L1 vertebral fracture (HCC) 11/14/2021   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   Genetic testing 01/15/2019   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   Family history of colon cancer    Family history of stomach cancer    Lung cancer, 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HCC) 06/25/2018   Lung nodule 06/24/2018   Multiple thyroid  nodules 01/15/2018   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   Coronary  artery disease involving native coronary artery of native heart without angina pectoris 04/23/2016   Hyperlipidemia    GERD (gastroesophageal reflux disease)    Essential hypertension 02/16/2016    PCP: Janifer Meigs, MD  REFERRING PROVIDER: Dione Franks, PA  REFERRING DIAG: LBP  Rationale for Evaluation and Treatment: Rehabilitation  THERAPY DIAG:  Other low back pain  Muscle spasm of back  ONSET DATE: 04/15/23  SUBJECTIVE:                                                                                                                                                                                           SUBJECTIVE STATEMENT: Patient reports that he is experiencing L lateral hip pain, worse toward end of day and is affecting his walking distance quite a bit, he is having to stop earlier than usual due to pain . Hip pain is new, for around 2 weeks.  Patient reports that he has had LBP since the first surgery below, had aquatic PT.  Had injections of the lumbar area without any relief with this.  The MD refers today for any pain relief, dry needling, ionto massage.    PERTINENT HISTORY:  November 2023 lumbar fusion, May 2025 removed some hardware, hx of lung cancer but is all clear at this time  PAIN:  Are you having pain? Yes: NPRS scale: 3/10 Pain location: low back primarily left Pain description: ache Aggravating factors: sitting, lying down, bending pain up to 6/10 Relieving factors: walking and moving, OTC pain med, heat at best pain a 2/10  PRECAUTIONS: None  RED FLAGS: None   WEIGHT BEARING RESTRICTIONS: No  FALLS:  Has patient fallen in last 6 months? No  LIVING ENVIRONMENT: Lives with: lives with their family Lives in: House/apartment Stairs: Yes: Internal: 15 steps; can reach both Has following equipment at home: None  OCCUPATION: retired  PLOF: Independent and walk 2 miles a day, some wood working, Social research officer, government  PATIENT GOALS: have less pain  NEXT  MD VISIT:  none scheduled  OBJECTIVE:  Note: Objective measures were completed at Evaluation unless otherwise noted.  DIAGNOSTIC FINDINGS:  IMPRESSION: 1. 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 2. 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  PATIENT SURVEYS:  Modified Oswestry 56%   COGNITION: Overall cognitive status: Within functional limits for tasks assessed     SENSATION: WFL  MUSCLE LENGTH:  SLR very tight 40 degrees, very tight piriformis  POSTURE: rounded shoulders, forward head, and decreased lumbar lordosis  PALPATION: Very tight in the lumbar area, the piriformis, tender in the lumbar worse on the left  LUMBAR ROM:   AROM eval  Flexion Decreased 75%  Extension Decreased 75%  Right lateral flexion Decreased 75%  Left lateral flexion Decreased 75%  Right rotation   Left rotation    (Blank rows = not tested)  LOWER EXTREMITY ROM:   very tight HS and piriformis   LOWER EXTREMITY MMT:  4/5   LUMBAR SPECIAL TESTS:  Straight leg raise test: Positive 40 degrees  FUNCTIONAL TESTS:  Timed up and go (TUG): 19 seconds  GAIT: Distance walked: 50 feet Assistive device utilized: None Level of assistance: Complete Independence Comments: mild limp on the left is noticeable when he first gets up  TREATMENT DATE:  05/22/23:  Assessed standing posture, L lateral pelvic shift evident, with some difference in iliac crest height. L lateral hip muscular strength TFL 4+/5 L glut med strength 4+/5  Palpation with some discomfort with deep pressure over L glut medius, non tender with palpation of L lat trochanter, L TFL, post L hip jt. Noted diffuse atrophy B gluteals  Side lying R for deep massage over L glut med with the theragun. , then  Instructed in various activities to strengthen B gluteals due to his atrophy Supine thighs over 65 cm  physioball for bridging, pt with back and unable to effectively engage gluteals. Supine for hooklying bridging, utilized with feet narrow, shoulder width and wide to engage different aspects of gluteals, with some success but still back pain per pt Quadriped with feet off hi/low table for donkey kicks Quadriped donkey kicks over physioball on table from standing but pt unable to tolerate the positioning over the ball Leg press, 20# B feet parallel x 10, then staggered feet for 10 x each leg forward, then resumed with feet parallel.  Hips and knees at 90 degree angle.     Standing for heel raises B, forefeet on 3" bar, 20 reps, cues to maintain upright posture, avoid flexing B knees.  Pt reported some L lat hip pain with this activity.    05/14/23 Nustep level 5 x 6 minutes Seated ball roll outs Feet on ball K2C, rotation, small bridge, isometric abs Passive stretch HS and piriformis, very tight and resistant to the good stretch STM to the bilateral L-p-spinals MHP/IFC to the Lumbar area in sitting  05/01/23: Manual: all in L side lying:  Trigger Point Dry Needling  Subsequent Treatment: Instructions provided previously at initial dry needling treatment.  Instructions reviewed, if requested by the patient, prior to subsequent dry needling treatment.   Patient Verbal Consent Given: Yes Education Handout Provided: Previously Provided Muscles Treated: B lumbar paraspinals, R gluteus maximus Electrical Stimulation Performed: No Treatment Response/Outcome: twitch response lumbar parspinals  Theragun massage B paraspinals and gluts while pt in L side lying  Supine for IFC, for pain  management with moist heat lower back for 12 min for trial , pt has TENS unit but wanted to compare IFC with out electric unit to home   While in supine the pt was instructed in hamstring stretch , using yoga strap around forefoot, as alternative to seated stretch. He tolerated well.    04/23/23     Trigger Point  Dry Needling  Initial Treatment: Pt instructed on Dry Needling rational, procedures, and possible side effects. Pt instructed to expect mild to moderate muscle soreness later in the day and/or into the next day.  Pt instructed in methods to reduce muscle soreness. Pt instructed to continue prescribed HEP. Because Dry Needling was performed over or adjacent to a lung field, pt was educated on S/S of pneumothorax and to seek immediate medical attention should they occur.  Patient was educated on signs and symptoms of infection and other risk factors and advised to seek medical attention should they occur.  Patient verbalized understanding of these instructions and education.   Patient Verbal Consent Given: Yes Education Handout Provided: Yes Muscles Treated: left lumbar paraspinals Treatment Response/Outcome: LTR's STM to the left lumbar paraspinals in right sidelying                                                                                                                            PATIENT EDUCATION:  Education details: POC/HEP Person educated: Patient Education method: Explanation, Facilities manager, and Handouts Education comprehension: verbalized understanding  HOME EXERCISE PROGRAM: Access Code: [email or web address] URL: [email or web address] Date: 04/23/2023 Prepared by: Cherylene Corrente  Exercises - Supine Piriformis Stretch Pulling Heel to Hip  - 2 x daily - 7 x weekly - 1 sets - 5 reps - 30 hold - Seated Hamstring Stretch with Chair  - 2 x daily - 7 x weekly - 1 sets - 5 reps - 30 hold  ASSESSMENT:  CLINICAL IMPRESSION:05/22/23: recent onset of L lateral hip pain.  No specific strength deficits, some tenderness L glut medius, some postural changes as well with L lateral pelvic shift. Does have marked loss of muscle mass B gluteals so spent most of session trying to determine a safe and effective gluteal strengthening regimen.  He feels that he can incorporate the leg  [facility]  Most of his current exercises involve stretching.  Advised him to perform 2 x weekly to 3 x weekly for effective strengthening.  He also may benefit from lift R shoe, will assess at his next session.  Eval;Patient is a 78 y.o. male who was seen today for physical therapy evaluation and treatment for LBP.  He has had two surgeries on the lumbar spine, has a fusion.  Reports that pain has never really improved, tried injections and aquatic PT.  HE is tight and tender int he left low back.  He has very limited lumbar ROM, he is extremely tight in the HS and the piriformis.  MD wanted him to try  dry needling and some STM to see if this could help pain levels   OBJECTIVE IMPAIRMENTS: Abnormal gait, cardiopulmonary status limiting activity, decreased activity tolerance, decreased balance, decreased endurance, decreased mobility, difficulty walking, decreased ROM, decreased strength, increased fascial restrictions, increased muscle spasms, impaired flexibility, improper body mechanics, postural dysfunction, and pain.  REHAB POTENTIAL: Good  CLINICAL DECISION MAKING: Stable/uncomplicated  EVALUATION COMPLEXITY: Low   GOALS: Goals reviewed with patient? Yes  SHORT TERM GOALS: Target date: 05/13/23  Independent with initial HEP Baseline: Goal status: met 05/14/23  LONG TERM GOALS: Target date: 07/23/23  Independent with advanced HEP Baseline:  Goal status: INITIAL  2.  Understand posture and body mechanics Baseline:  Goal status: INITIAL  3.  Decrease pain 25% Baseline:  Goal status: INITIAL  4.  Improve lumbar ROM 25% Baseline:  Goal status: INITIAL  5.  Improve SLR to 60 degrees Baseline:  Goal status: INITIAL  PLAN:  PT FREQUENCY: 1-2x/week  PT DURATION: 12 weeks  PLANNED INTERVENTIONS: 97164- PT Re-evaluation, 97110-Therapeutic exercises, 97530- Therapeutic activity, [id number]- Neuromuscular re-education, 97535- Self Care, 16109- Manual therapy,  [id number]- Electrical stimulation (unattended), [phone]- Ionotophoresis 4mg /ml Dexamethasone , Patient/Family education, Taping, Dry Needling, Cryotherapy, and Moist heat.  PLAN FOR NEXT SESSION: start some core and flexibility, could try STM and taping   Janeice Stegall L Eustace Hur, PT, DPT, OCS 05/22/2023, 11:32 AM

## 2023-05-24 DIAGNOSIS — M25552 Pain in left hip: Secondary | ICD-10-CM | POA: Diagnosis not present

## 2023-05-27 ENCOUNTER — Ambulatory Visit

## 2023-05-27 ENCOUNTER — Other Ambulatory Visit: Payer: Self-pay

## 2023-05-27 DIAGNOSIS — M5459 Other low back pain: Secondary | ICD-10-CM | POA: Diagnosis not present

## 2023-05-27 DIAGNOSIS — M62830 Muscle spasm of back: Secondary | ICD-10-CM

## 2023-05-27 NOTE — Therapy (Signed)
 OUTPATIENT PHYSICAL THERAPY THORACOLUMBAR EVALUATION   Patient Name: Miguel Wolfe MRN: [id number] DOB:[birth year], 78 y.o., male Today's Date: 05/27/2023  END OF SESSION:  PT End of Session - 05/27/23 1706     Visit Number 5    Date for PT Re-Evaluation 07/23/23    Authorization Type Medicare    Progress Note Due on Visit 10    PT Start Time 1515    PT Stop Time 1600    PT Time Calculation (min) 45 min    Activity Tolerance Patient tolerated treatment well    Behavior During Therapy WFL for tasks assessed/performed                Past Medical History:  Diagnosis Date   Aortic insufficiency    Arthritis    CAD (coronary artery disease)    One Stent placed   Colon polyps    Dyspnea    With exertion related to 1/2 lung removed   Family history of adverse reaction to anesthesia    Mom vomits and Dad went "nuts"   Family history of colon cancer    Family history of stomach cancer    Fatigue    GERD (gastroesophageal reflux disease)    Hemorrhoids    History of echocardiogram    Echo 02/08/16: Mild LVH, EF 55-60, no RWMA, Gr 1 DD, trivial AI, mild LAE   Hyperlipidemia    Hypertension    Lung cancer (HCC)    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   PONV (postoperative nausea and vomiting)    Pre-diabetes    Pulmonary nodules    Thyroid  cancer (HCC)    thyroid  removed   Past Surgical History:  Procedure Laterality Date   BACK SURGERY     COLONOSCOPY WITH ESOPHAGOGASTRODUODENOSCOPY (EGD)  2008   CORONARY STENT INTERVENTION N/A 02/20/2016   Procedure: Coronary Stent Intervention;  Surgeon: Odie Benne, MD;  Location: [facility];  Service: Cardiovascular;  Laterality: N/A;   ESOPHAGOGASTRODUODENOSCOPY     HARDWARE REMOVAL Bilateral 05/10/2022   Procedure: REMOVAL OF THORACIC ELEVEN AND LUMBAR THREE PEDICLE SCREWS AND ROD SEGMENTS;  Surgeon: Augusto Blonder, MD;  Location: [facility] OR;  Service: Neurosurgery;  Laterality: Bilateral;  3C   INGUINAL HERNIA REPAIR Right  02/07/2022   Procedure: OPEN RIGHT INGUINAL HERNIA REPAIR WITH MESH;  Surgeon: Kinsinger, Alphonso Aschoff, MD;  Location: [facility];  Service: General;  Laterality: Right;   LEFT HEART CATH AND CORONARY ANGIOGRAPHY N/A 02/20/2016   Procedure: Left Heart Cath and Coronary Angiography;  Surgeon: Odie Benne, MD;  Location: [facility] INVASIVE CV LAB;  Service: Cardiovascular;  Laterality: N/A;   LUNG REMOVAL, PARTIAL Left 06/24/2018   Pt states "1/2 of lung was removed"   PILONIDAL CYST EXCISION     REFRACTIVE SURGERY Right    retina tear repair   THYROIDECTOMY N/A 01/17/2018   Procedure: TOTAL THYROIDECTOMY;  Surgeon: Oralee Billow, MD;  Location: [facility];  Service: General;  Laterality: N/A;   VIDEO ASSISTED THORACOSCOPY (VATS)/WEDGE RESECTION Left 06/24/2018   Procedure: VIDEO ASSISTED THORACOSCOPY (VATS)/LUNG RESECTION;  Surgeon: Norita Beauvais, MD;  Location: [facility] OR;  Service: Thoracic;  Laterality: Left;   VIDEO BRONCHOSCOPY WITH ENDOBRONCHIAL NAVIGATION N/A 11/18/2017   Procedure: VIDEO BRONCHOSCOPY WITH ENDOBRONCHIAL NAVIGATION WITH BIOPSIES OF LEFT AND RIGHT UPPER LOBES;  Surgeon: Norita Beauvais, MD;  Location: [facility] OR;  Service: Thoracic;  Laterality: N/A;   VIDEO BRONCHOSCOPY WITH ENDOBRONCHIAL NAVIGATION N/A 06/11/2018   Procedure: VIDEO BRONCHOSCOPY WITH ENDOBRONCHIAL  NAVIGATION;  Surgeon: Norita Beauvais, MD;  Location: [facility] OR;  Service: Thoracic;  Laterality: N/A;   Patient Active Problem List   Diagnosis Date Noted   Pain from implanted hardware 05/10/2022   L1 vertebral fracture (HCC) 11/14/2021   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   Genetic testing 01/15/2019   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   Family history of colon cancer    Family history of stomach cancer    Lung cancer, 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HCC) 06/25/2018   Lung nodule 06/24/2018   Multiple thyroid  nodules 01/15/2018   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   Coronary  artery disease involving native coronary artery of native heart without angina pectoris 04/23/2016   Hyperlipidemia    GERD (gastroesophageal reflux disease)    Essential hypertension 02/16/2016    PCP: Janifer Meigs, MD  REFERRING PROVIDER: Dione Franks, PA  REFERRING DIAG: LBP  Rationale for Evaluation and Treatment: Rehabilitation  THERAPY DIAG:  Other low back pain  Muscle spasm of back  ONSET DATE: 04/15/23  SUBJECTIVE:                                                                                                                                                                                           SUBJECTIVE STATEMENT: Patient reports that he is taking a steroid dose pack due to his L lat hip pain.  Not sure that it is helping much   Patient reports that he has had LBP since the first surgery below, had aquatic PT.  Had injections of the lumbar area without any relief with this.  The MD refers today for any pain relief, dry needling, ionto massage.    PERTINENT HISTORY:  November 2023 lumbar fusion, May 2025 removed some hardware, hx of lung cancer but is all clear at this time  PAIN:  Are you having pain? Yes: NPRS scale: 3/10 Pain location: low back primarily left Pain description: ache Aggravating factors: sitting, lying down, bending pain up to 6/10 Relieving factors: walking and moving, OTC pain med, heat at best pain a 2/10  PRECAUTIONS: None  RED FLAGS: None   WEIGHT BEARING RESTRICTIONS: No  FALLS:  Has patient fallen in last 6 months? No  LIVING ENVIRONMENT: Lives with: lives with their family Lives in: House/apartment Stairs: Yes: Internal: 15 steps; can reach both Has following equipment at home: None  OCCUPATION: retired  PLOF: Independent and walk 2 miles a day, some wood working, Social research officer, government  PATIENT GOALS: have less pain  NEXT MD VISIT: none scheduled  OBJECTIVE:  Note: Objective measures were completed at Evaluation unless otherwise  noted.  DIAGNOSTIC FINDINGS:  IMPRESSION: 1. 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 2. 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  PATIENT SURVEYS:  Modified Oswestry 56%   COGNITION: Overall cognitive status: Within functional limits for tasks assessed     SENSATION: WFL  MUSCLE LENGTH:  SLR very tight 40 degrees, very tight piriformis  POSTURE: rounded shoulders, forward head, and decreased lumbar lordosis  PALPATION: Very tight in the lumbar area, the piriformis, tender in the lumbar worse on the left  LUMBAR ROM:   AROM eval  Flexion Decreased 75%  Extension Decreased 75%  Right lateral flexion Decreased 75%  Left lateral flexion Decreased 75%  Right rotation   Left rotation    (Blank rows = not tested)  LOWER EXTREMITY ROM:   very tight HS and piriformis   LOWER EXTREMITY MMT:  4/5   LUMBAR SPECIAL TESTS:  Straight leg raise test: Positive 40 degrees  FUNCTIONAL TESTS:  Timed up and go (TUG): 19 seconds  GAIT: Distance walked: 50 feet Assistive device utilized: None Level of assistance: Complete Independence Comments: mild limp on the left is noticeable when he first gets up  TREATMENT DATE:  05/27/23:  Manual: side lying R for TPDN: Trigger Point Dry Needling  Subsequent Treatment: Instructions provided previously at initial dry needling treatment.   Patient Verbal Consent Given: Yes Education Handout Provided: Previously Provided Muscles Treated: B L5 multifidus, B L4 multifidus Electrical Stimulation Performed: No Treatment Response/Outcome: twitch  Deep massage B lumbar paraspinals with theragun  Reassessed standing posture, continue with L lateral pelvic shift, added 1/8 cork lift under R shoe to improve alignment, pt to utilize for trial at home  Leg press, 20#, B LE 3 x 10    05/22/23:  Assessed  standing posture, L lateral pelvic shift evident, with some difference in iliac crest height. L lateral hip muscular strength TFL 4+/5 L glut med strength 4+/5  Palpation with some discomfort with deep pressure over L glut medius, non tender with palpation of L lat trochanter, L TFL, post L hip jt. Noted diffuse atrophy B gluteals  Side lying R for deep massage over L glut med with the theragun. , then  Instructed in various activities to strengthen B gluteals due to his atrophy Supine thighs over 65 cm physioball for bridging, pt with back and unable to effectively engage gluteals. Supine for hooklying bridging, utilized with feet narrow, shoulder width and wide to engage different aspects of gluteals, with some success but still back pain per pt Quadriped with feet off hi/low table for donkey kicks Quadriped donkey kicks over physioball on table from standing but pt unable to tolerate the positioning over the ball Leg press, 20# B feet parallel x 10, then staggered feet for 10 x each leg forward, then resumed with feet parallel.  Hips and knees at 90 degree angle.     Standing for heel raises B, forefeet on 3" bar, 20 reps, cues to maintain upright posture, avoid flexing B knees.  Pt reported some L lat hip pain with this activity.    05/14/23 Nustep level 5 x 6 minutes Seated ball roll outs Feet on ball K2C, rotation, small bridge, isometric abs Passive stretch HS and piriformis, very tight and resistant to the good stretch STM to the bilateral L-p-spinals MHP/IFC to the Lumbar area in sitting  05/01/23: Manual: all in L side  lying:  Trigger Point Dry Needling  Subsequent Treatment: Instructions provided previously at initial dry needling treatment.  Instructions reviewed, if requested by the patient, prior to subsequent dry needling treatment.   Patient Verbal Consent Given: Yes Education Handout Provided: Previously Provided Muscles Treated: B lumbar paraspinals, R gluteus  maximus Electrical Stimulation Performed: No Treatment Response/Outcome: twitch response lumbar parspinals  Theragun massage B paraspinals and gluts while pt in L side lying  Supine for IFC, for pain management with moist heat lower back for 12 min for trial , pt has TENS unit but wanted to compare IFC with out electric unit to home   While in supine the pt was instructed in hamstring stretch , using yoga strap around forefoot, as alternative to seated stretch. He tolerated well.    04/23/23     Trigger Point Dry Needling  Initial Treatment: Pt instructed on Dry Needling rational, procedures, and possible side effects. Pt instructed to expect mild to moderate muscle soreness later in the day and/or into the next day.  Pt instructed in methods to reduce muscle soreness. Pt instructed to continue prescribed HEP. Because Dry Needling was performed over or adjacent to a lung field, pt was educated on S/S of pneumothorax and to seek immediate medical attention should they occur.  Patient was educated on signs and symptoms of infection and other risk factors and advised to seek medical attention should they occur.  Patient verbalized understanding of these instructions and education.   Patient Verbal Consent Given: Yes Education Handout Provided: Yes Muscles Treated: left lumbar paraspinals Treatment Response/Outcome: LTR's STM to the left lumbar paraspinals in right sidelying                                                                                                                            PATIENT EDUCATION:  Education details: POC/HEP Person educated: Patient Education method: Explanation, Facilities manager, and Handouts Education comprehension: verbalized understanding  HOME EXERCISE PROGRAM: Access Code: [email or web address] URL: [email or web address] Date: 04/23/2023 Prepared by: Cherylene Corrente  Exercises - Supine Piriformis Stretch Pulling Heel to Hip  - 2 x daily - 7  x weekly - 1 sets - 5 reps - 30 hold - Seated Hamstring Stretch with Chair  - 2 x daily - 7 x weekly - 1 sets - 5 reps - 30 hold  ASSESSMENT:  CLINICAL IMPRESSION:05/27/23:  the patient is attending skilled PT to address his lower back pain.  So far has responded well to TPDN.  Also has loss of muscle mass B gluteals, so have changed some of focus of treatment to gluteal strengthening.  Has som postural asymmetry which may possibly be contributing to L lateral hip pain, therefore trial of cork insert to achieve more symmetrical pelvic alignment.  Eval;Patient is a 78 y.o. male who was seen today for physical therapy evaluation and treatment for LBP.  He has had two surgeries on the lumbar spine, has a fusion.  Reports that pain has  never really improved, tried injections and aquatic PT.  HE is tight and tender int he left low back.  He has very limited lumbar ROM, he is extremely tight in the HS and the piriformis.  MD wanted him to try dry needling and some STM to see if this could help pain levels   OBJECTIVE IMPAIRMENTS: Abnormal gait, cardiopulmonary status limiting activity, decreased activity tolerance, decreased balance, decreased endurance, decreased mobility, difficulty walking, decreased ROM, decreased strength, increased fascial restrictions, increased muscle spasms, impaired flexibility, improper body mechanics, postural dysfunction, and pain.  REHAB POTENTIAL: Good  CLINICAL DECISION MAKING: Stable/uncomplicated  EVALUATION COMPLEXITY: Low   GOALS: Goals reviewed with patient? Yes  SHORT TERM GOALS: Target date: 05/13/23  Independent with initial HEP Baseline: Goal status: met 05/14/23  LONG TERM GOALS: Target date: 07/23/23  Independent with advanced HEP Baseline:  Goal status: INITIAL  2.  Understand posture and body mechanics Baseline:  Goal status: INITIAL  3.  Decrease pain 25% Baseline:  Goal status: INITIAL  4.  Improve lumbar ROM 25% Baseline:  Goal status:  05/27/23: in progress  5.  Improve SLR to 60 degrees Baseline:  Goal status: INITIAL  PLAN:  PT FREQUENCY: 1-2x/week  PT DURATION: 12 weeks  PLANNED INTERVENTIONS: 97164- PT Re-evaluation, 97110-Therapeutic exercises, 97530- Therapeutic activity, [id number]- Neuromuscular re-education, 97535- Self Care, 53664- Manual therapy, [id number]- Electrical stimulation (unattended), [phone]- Ionotophoresis 4mg /ml Dexamethasone , Patient/Family education, Taping, Dry Needling, Cryotherapy, and Moist heat.  PLAN FOR NEXT SESSION: some core and flexibility, TPDN, manual glut strength  Novali Vollman L Jarron Curley, PT, DPT, OCS 05/27/2023, 5:07 PM

## 2023-05-29 DIAGNOSIS — M7062 Trochanteric bursitis, left hip: Secondary | ICD-10-CM | POA: Diagnosis not present

## 2023-05-29 DIAGNOSIS — M1612 Unilateral primary osteoarthritis, left hip: Secondary | ICD-10-CM | POA: Diagnosis not present

## 2023-06-01 DIAGNOSIS — I25111 Atherosclerotic heart disease of native coronary artery with angina pectoris with documented spasm: Secondary | ICD-10-CM | POA: Diagnosis not present

## 2023-06-01 DIAGNOSIS — I10 Essential (primary) hypertension: Secondary | ICD-10-CM | POA: Diagnosis not present

## 2023-06-05 ENCOUNTER — Ambulatory Visit

## 2023-06-07 DIAGNOSIS — M1612 Unilateral primary osteoarthritis, left hip: Secondary | ICD-10-CM | POA: Diagnosis not present

## 2023-06-08 DIAGNOSIS — Z85118 Personal history of other malignant neoplasm of bronchus and lung: Secondary | ICD-10-CM | POA: Diagnosis not present

## 2023-06-08 DIAGNOSIS — I10 Essential (primary) hypertension: Secondary | ICD-10-CM | POA: Diagnosis not present

## 2023-06-08 DIAGNOSIS — E782 Mixed hyperlipidemia: Secondary | ICD-10-CM | POA: Diagnosis not present

## 2023-06-08 DIAGNOSIS — E890 Postprocedural hypothyroidism: Secondary | ICD-10-CM | POA: Diagnosis not present

## 2023-06-08 DIAGNOSIS — I25111 Atherosclerotic heart disease of native coronary artery with angina pectoris with documented spasm: Secondary | ICD-10-CM | POA: Diagnosis not present

## 2023-06-10 DIAGNOSIS — M25552 Pain in left hip: Secondary | ICD-10-CM | POA: Diagnosis not present

## 2023-06-11 ENCOUNTER — Ambulatory Visit: Admitting: Physical Therapy

## 2023-06-18 DIAGNOSIS — H35371 Puckering of macula, right eye: Secondary | ICD-10-CM | POA: Diagnosis not present

## 2023-06-18 DIAGNOSIS — H40013 Open angle with borderline findings, low risk, bilateral: Secondary | ICD-10-CM | POA: Diagnosis not present

## 2023-06-18 DIAGNOSIS — H25813 Combined forms of age-related cataract, bilateral: Secondary | ICD-10-CM | POA: Diagnosis not present

## 2023-06-20 ENCOUNTER — Other Ambulatory Visit: Payer: Self-pay | Admitting: Sports Medicine

## 2023-06-20 DIAGNOSIS — M25552 Pain in left hip: Secondary | ICD-10-CM

## 2023-06-20 DIAGNOSIS — M7062 Trochanteric bursitis, left hip: Secondary | ICD-10-CM | POA: Diagnosis not present

## 2023-06-22 ENCOUNTER — Ambulatory Visit
Admission: RE | Admit: 2023-06-22 | Discharge: 2023-06-22 | Disposition: A | Discharge status: Home / Self Care | Source: Ambulatory Visit | Attending: Sports Medicine | Admitting: Sports Medicine

## 2023-06-22 DIAGNOSIS — M25552 Pain in left hip: Secondary | ICD-10-CM | POA: Diagnosis not present

## 2023-07-05 ENCOUNTER — Other Ambulatory Visit

## 2023-07-16 ENCOUNTER — Ambulatory Visit
Admission: RE | Admit: 2023-07-16 | Discharge: 2023-07-16 | Disposition: A | Discharge status: Home / Self Care | Source: Ambulatory Visit | Attending: Radiation Oncology | Admitting: Radiation Oncology

## 2023-07-16 DIAGNOSIS — S32019A Unspecified fracture of first lumbar vertebra, initial encounter for closed fracture: Secondary | ICD-10-CM | POA: Diagnosis not present

## 2023-07-16 DIAGNOSIS — R222 Localized swelling, mass and lump, trunk: Secondary | ICD-10-CM | POA: Diagnosis not present

## 2023-07-16 DIAGNOSIS — D1809 Hemangioma of other sites: Secondary | ICD-10-CM

## 2023-07-16 MED ORDER — GADOPICLENOL 0.5 MMOL/ML IV SOLN
7.0000 mL | Freq: Once | INTRAVENOUS | Status: AC | PRN
  Administered 2023-07-16: 7 mL via INTRAVENOUS

## 2023-07-22 ENCOUNTER — Other Ambulatory Visit: Payer: Self-pay

## 2023-07-22 ENCOUNTER — Ambulatory Visit: Attending: Family Medicine

## 2023-07-22 DIAGNOSIS — M5459 Other low back pain: Secondary | ICD-10-CM | POA: Diagnosis not present

## 2023-07-22 DIAGNOSIS — M25552 Pain in left hip: Secondary | ICD-10-CM | POA: Diagnosis not present

## 2023-07-22 DIAGNOSIS — D1809 Hemangioma of other sites: Secondary | ICD-10-CM | POA: Diagnosis not present

## 2023-07-22 DIAGNOSIS — M62830 Muscle spasm of back: Secondary | ICD-10-CM | POA: Insufficient documentation

## 2023-07-22 NOTE — Therapy (Signed)
 OUTPATIENT PHYSICAL THERAPY THORACOLUMBAR EVALUATION   Patient Name: Miguel Wolfe MRN: [id number] DOB:[birth year], 78 y.o., male Today's Date: 07/22/2023  END OF SESSION:  PT End of Session - 07/22/23 1448     Visit Number 1    Date for PT Re-Evaluation 10/14/23    Authorization Type Medicare    Progress Note Due on Visit 10    PT Start Time 1345    PT Stop Time 1430    PT Time Calculation (min) 45 min    Activity Tolerance Patient tolerated treatment well    Behavior During Therapy WFL for tasks assessed/performed              Past Medical History:  Diagnosis Date   Aortic insufficiency    Arthritis    CAD (coronary artery disease)    One Stent placed   Colon polyps    Dyspnea    With exertion related to 1/2 lung removed   Family history of adverse reaction to anesthesia    Mom vomits and Dad went nuts   Family history of colon cancer    Family history of stomach cancer    Fatigue    GERD (gastroesophageal reflux disease)    Hemorrhoids    History of echocardiogram    Echo 02/08/16: Mild LVH, EF 55-60, no RWMA, Gr 1 DD, trivial AI, mild LAE   Hyperlipidemia    Hypertension    Lung cancer (HCC)    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   PONV (postoperative nausea and vomiting)    Pre-diabetes    Pulmonary nodules    Thyroid  cancer (HCC)    thyroid  removed   Past Surgical History:  Procedure Laterality Date   BACK SURGERY     COLONOSCOPY WITH ESOPHAGOGASTRODUODENOSCOPY (EGD)  2008   CORONARY STENT INTERVENTION N/A 02/20/2016   Procedure: Coronary Stent Intervention;  Surgeon: Lonni JONETTA Cash, MD;  Location: [facility];  Service: Cardiovascular;  Laterality: N/A;   ESOPHAGOGASTRODUODENOSCOPY     HARDWARE REMOVAL Bilateral 05/10/2022   Procedure: REMOVAL OF THORACIC ELEVEN AND LUMBAR THREE PEDICLE SCREWS AND ROD SEGMENTS;  Surgeon: Lanis Pupa, MD;  Location: [facility] OR;  Service: Neurosurgery;  Laterality: Bilateral;  3C   INGUINAL HERNIA REPAIR Right  02/07/2022   Procedure: OPEN RIGHT INGUINAL HERNIA REPAIR WITH MESH;  Surgeon: Kinsinger, Herlene Righter, MD;  Location: [facility];  Service: General;  Laterality: Right;   LEFT HEART CATH AND CORONARY ANGIOGRAPHY N/A 02/20/2016   Procedure: Left Heart Cath and Coronary Angiography;  Surgeon: Lonni JONETTA Cash, MD;  Location: [facility] INV[facility]IVE CV [facility];  Service: Cardiovascular;  Laterality: N/A;   LUNG REMOVAL, PARTIAL Left 06/24/2018   Pt states 1/2 of lung was removed   PILONIDAL CYST EXCISION     REFRACTIVE SURGERY Right    retina tear repair   THYROIDECTOMY N/A 01/17/2018   Procedure: TOTAL THYROIDECTOMY;  Surgeon: Eletha Boas, MD;  Location: [facility];  Service: General;  Laterality: N/A;   VIDEO ASSISTED THORACOSCOPY (VATS)/WEDGE RESECTION Left 06/24/2018   Procedure: VIDEO ASSISTED THORACOSCOPY (VATS)/LUNG RESECTION;  Surgeon: Army Dallas NOVAK, MD;  Location: [facility] OR;  Service: Thoracic;  Laterality: Left;   VIDEO BRONCHOSCOPY WITH ENDOBRONCHIAL NAVIGATION N/A 11/18/2017   Procedure: VIDEO BRONCHOSCOPY WITH ENDOBRONCHIAL NAVIGATION WITH BIOPSIES OF LEFT AND RIGHT UPPER LOBES;  Surgeon: Army Dallas NOVAK, MD;  Location: [facility] OR;  Service: Thoracic;  Laterality: N/A;   VIDEO BRONCHOSCOPY WITH ENDOBRONCHIAL NAVIGATION N/A 06/11/2018   Procedure: VIDEO BRONCHOSCOPY WITH ENDOBRONCHIAL NAVIGATION;  Surgeon: Army Dallas NOVAK, MD;  Location: [facility] OR;  Service: Thoracic;  Laterality: N/A;   Patient Active Problem List   Diagnosis Date Noted   Pain from implanted hardware 05/10/2022   L1 vertebral fracture (HCC) 11/14/2021   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   Genetic testing 01/15/2019   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   Family history of colon cancer    Family history of stomach cancer    Lung cancer, 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HCC) 06/25/2018   Lung nodule 06/24/2018   Multiple thyroid  nodules 01/15/2018   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   Coronary  artery disease involving native coronary artery of native heart without angina pectoris 04/23/2016   Hyperlipidemia    GERD (gastroesophageal reflux disease)    Essential hypertension 02/16/2016    PCP: Seabron, MD  REFERRING PROVIDER: Meade, PA  REFERRING DIAG: LBP  Rationale for Evaluation and Treatment: Rehabilitation  THERAPY DIAG:  Other low back pain  Muscle spasm of back  Pain in left hip  ONSET DATE: 04/15/23  SUBJECTIVE:                                                                                                                                                                                           SUBJECTIVE STATEMENT: Patient has attended PT due to lower back pain, last time in May, developed L sided hip pain. Had MRI, saw neurosurgeon, surgeon wants PT after veiwing results of MRI.  Pt has had to different L hip injections, L lateral and anterior hip jt without relief  PERTINENT HISTORY:  November 2023 lumbar fusion, May 2025 removed some hardware, hx of lung cancer but is all clear at this time  PAIN:  Are you having pain? Yes: NPRS scale: 3/10 Pain location: low back primarily left Pain description: ache Aggravating factors: sitting, lying down, bending pain up to 6/10 Relieving factors: walking and moving, OTC pain med, heat at best pain a 2/10  PRECAUTIONS: None  RED FLAGS: None   WEIGHT BEARING RESTRICTIONS: No  FALLS:  Has patient fallen in last 6 months? No  LIVING ENVIRONMENT: Lives with: lives with their family Lives in: House/apartment Stairs: Yes: Internal: 15 steps; can reach both Has following equipment at home: None  OCCUPATION: retired  PLOF: Independent and walk 2 miles a day, some wood working, Social research officer, government  PATIENT GOALS: have less pain  NEXT MD VISIT: none scheduled  OBJECTIVE:  Note: Objective measures were completed at Evaluation unless otherwise noted. IMPRESSION: 1. Compression/burst fracture at L1 at the site of  known intraosseous hemangioma has progressed since prior  examination in January 2025 resulting in approximately 75% height loss. Moderate retropulsion into the canal resulting in moderate spinal canal stenosis. 2. Unchanged size and appearance of 2.8 cm paraspinal mass at the right aspect of the L1 vertebral body.     Electronically Signed   By: Clem Savory M.D.   On: 07/17/2023 11:03  PATIENT SURVEYS:  Modified Oswestry: Modified Oswestry Low Back Pain Disability Questionnaire: 27 / 50 = 54.0 % Minimally Clinically Important Difference (MCID) = 12.8%  COGNITION: Overall cognitive status: Within functional limits for tasks assessed     SENSATION: WFL  MUSCLE LENGTH:  SLR very tight 40 degrees, very tight piriformis  POSTURE: rounded shoulders, forward head, and decreased lumbar lordosis  PALPATION: Very tight in the lumbar area, tender L quadratus , non tender really over lateral hip , reports some snapping L lateral hip with prolonged walking  LUMBAR ROM:   AROM 07/22/23  Flexion Decreased 75%  Extension Decreased 50%  Right lateral flexion Decreased50%  Left lateral flexion Decreased 50%  Right rotation   Left rotation    (Blank rows = not tested)  LOWER EXTREMITY ROM:   very tight HS and piriformis   LOWER EXTREMITY MMT:  lean throughout with gross loss of muscle mass, loss of gluteal mass B L hip ER shaky and weak, 4/5 L hip flexion 3+/5 with pain in lower back  L hip abd 4/5 All other MMT 4+/5 B LE's   LUMBAR SPECIAL TESTS:  Straight leg raise test: Positive 40 degrees  FUNCTIONAL TESTS:  Timed up and go (TUG): 19 seconds 5 x sit to stand 12.01 sec GAIT:   TREATMENT DATE:  07/22/23 Reassessment as above, 5 x sit to stand, ROM,strength R side lying for deep cross friction massage L quadratus lumborum Leg press B hips 20# 10 X 2 SEAT 8 R Side lying L hip clam shell 10 x 2 Education in self care, walking in pool at different depths for the bouyancy ,  to assist with decompression of spine Also education to possibly obtain LS corset , and use to provide with decompression of LS spine as well        ASSESSMENT:  CLINICAL IMPRESSION: Eval;Patient is a 78 y.o. male who was evaluated  today for physical therapy evaluation for L sided hip pain,  has had two surgeries on the lumbar spine, has a fusion.  Reports that pain has never really improved, tried injections and aquatic PT.  HE is tight and tender into the left low back. Now progression of L lateral hip pain, especially compromised with walking.  Has had to stop many of his stretches and activities.  Should benefit from PT , needs to address decompression ex and stretching, strengthening to reduce compression of LS spine with ned results of recent MRI noting further progression of L1 compression fx.   OBJECTIVE IMPAIRMENTS: Abnormal gait, cardiopulmonary status limiting activity, decreased activity tolerance, decreased balance, decreased endurance, decreased mobility, difficulty walking, decreased ROM, decreased strength, increased fascial restrictions, increased muscle spasms, impaired flexibility, improper body mechanics, postural dysfunction, and pain.  REHAB POTENTIAL: Good  CLINICAL DECISION MAKING: Stable/uncomplicated  EVALUATION COMPLEXITY: Low   GOALS: Goals reviewed with patient? Yes  SHORT TERM GOALS: Target date: 2 weeks, 08/05/23:  Independent with initial HEP Baseline: Goal status: met 05/14/23  LONG TERM GOALS: Target date: 12 weeks, 10/09/23  Independent with advanced HEP Baseline:  Goal status: INITIAL  2.  Understand posture and body mechanics Baseline:  Goal status:  INITIAL  3.  Decrease pain L lateral hip with walking by 25% Baseline:  Goal status: INITIAL  4.  Pain free resistance and normal strength L hip flexion,current 3+/5 with pain =goal status, Initial  PLAN:  PT FREQUENCY: 1-2x/week  PT DURATION: 12 weeks  PLANNED INTERVENTIONS: 97164- PT  Re-evaluation, 97110-Therapeutic exercises, 97530- Therapeutic activity, [id number]- Neuromuscular re-education, 97535- Self Care, 02859- Manual therapy, G0283- Electrical stimulation (unattended), [phone]- Ionotophoresis 4mg /ml Dexamethasone , Patient/Family education, Taping, Dry Needling, Cryotherapy, and Moist heat.  PLAN FOR NEXT SESSION: some core and flexibility, manual glut strength, ? ,manual lumbar traction  Jinger Middlesworth L Laruen Risser, PT, DPT, OCS 07/22/2023, 2:51 PM

## 2023-07-23 ENCOUNTER — Other Ambulatory Visit: Payer: Medicare Other

## 2023-07-25 DIAGNOSIS — H35033 Hypertensive retinopathy, bilateral: Secondary | ICD-10-CM | POA: Diagnosis not present

## 2023-07-25 DIAGNOSIS — H35363 Drusen (degenerative) of macula, bilateral: Secondary | ICD-10-CM | POA: Diagnosis not present

## 2023-07-25 DIAGNOSIS — H35371 Puckering of macula, right eye: Secondary | ICD-10-CM | POA: Diagnosis not present

## 2023-07-25 DIAGNOSIS — H43813 Vitreous degeneration, bilateral: Secondary | ICD-10-CM | POA: Diagnosis not present

## 2023-07-25 DIAGNOSIS — H31091 Other chorioretinal scars, right eye: Secondary | ICD-10-CM | POA: Diagnosis not present

## 2023-08-05 ENCOUNTER — Ambulatory Visit (HOSPITAL_COMMUNITY)
Admission: RE | Admit: 2023-08-05 | Discharge: 2023-08-05 | Disposition: A | Discharge status: Home / Self Care | Payer: Medicare Other | Source: Ambulatory Visit | Attending: Internal Medicine | Admitting: Internal Medicine

## 2023-08-05 ENCOUNTER — Inpatient Hospital Stay: Payer: Medicare Other | Attending: Internal Medicine

## 2023-08-05 DIAGNOSIS — Z902 Acquired absence of lung [part of]: Secondary | ICD-10-CM | POA: Diagnosis not present

## 2023-08-05 DIAGNOSIS — M4856XA Collapsed vertebra, not elsewhere classified, lumbar region, initial encounter for fracture: Secondary | ICD-10-CM | POA: Diagnosis not present

## 2023-08-05 DIAGNOSIS — J929 Pleural plaque without asbestos: Secondary | ICD-10-CM | POA: Diagnosis not present

## 2023-08-05 DIAGNOSIS — I700 Atherosclerosis of aorta: Secondary | ICD-10-CM | POA: Insufficient documentation

## 2023-08-05 DIAGNOSIS — C3490 Malignant neoplasm of unspecified part of unspecified bronchus or lung: Secondary | ICD-10-CM | POA: Insufficient documentation

## 2023-08-05 LAB — CBC WITH DIFFERENTIAL (CANCER CENTER ONLY)
Abs Immature Granulocytes: 0.01 K/uL (ref 0.00–0.07)
Basophils Absolute: 0.1 K/uL (ref 0.0–0.1)
Basophils Relative: 1 %
Eosinophils Absolute: 0.3 K/uL (ref 0.0–0.5)
Eosinophils Relative: 4 %
HCT: 43.6 % (ref 39.0–52.0)
Hemoglobin: 14.7 g/dL (ref 13.0–17.0)
Immature Granulocytes: 0 %
Lymphocytes Relative: 33 %
Lymphs Abs: 2 K/uL (ref 0.7–4.0)
MCH: 29.9 pg (ref 26.0–34.0)
MCHC: 33.7 g/dL (ref 30.0–36.0)
MCV: 88.6 fL (ref 80.0–100.0)
Monocytes Absolute: 0.7 K/uL (ref 0.1–1.0)
Monocytes Relative: 11 %
Neutro Abs: 3.1 K/uL (ref 1.7–7.7)
Neutrophils Relative %: 51 %
Platelet Count: 166 K/uL (ref 150–400)
RBC: 4.92 MIL/uL (ref 4.22–5.81)
RDW: 13.2 % (ref 11.5–15.5)
WBC Count: 6.1 K/uL (ref 4.0–10.5)
nRBC: 0 % (ref 0.0–0.2)

## 2023-08-05 LAB — CMP (CANCER CENTER ONLY)
ALT: 14 U/L (ref 0–44)
AST: 20 U/L (ref 15–41)
Albumin: 4.6 g/dL (ref 3.5–5.0)
Alkaline Phosphatase: 65 U/L (ref 38–126)
Anion gap: 5 (ref 5–15)
BUN: 13 mg/dL (ref 8–23)
CO2: 33 mmol/L — ABNORMAL HIGH (ref 22–32)
Calcium: 9.2 mg/dL (ref 8.9–10.3)
Chloride: 104 mmol/L (ref 98–111)
Creatinine: 0.8 mg/dL (ref 0.61–1.24)
GFR, Estimated: >60 mL/min (ref >60)
Glucose, Bld: 97 mg/dL (ref 70–99)
Potassium: 3.6 mmol/L (ref 3.5–5.1)
Sodium: 142 mmol/L (ref 135–145)
Total Bilirubin: 0.8 mg/dL (ref 0.0–1.2)
Total Protein: 7 g/dL (ref 6.5–8.1)

## 2023-08-05 MED ORDER — IOHEXOL 300 MG/ML  SOLN
100.0000 mL | Freq: Once | INTRAMUSCULAR | Status: AC | PRN
  Administered 2023-08-05: 100 mL via INTRAVENOUS

## 2023-08-07 ENCOUNTER — Ambulatory Visit

## 2023-08-07 DIAGNOSIS — M25552 Pain in left hip: Secondary | ICD-10-CM | POA: Diagnosis not present

## 2023-08-07 DIAGNOSIS — M5459 Other low back pain: Secondary | ICD-10-CM

## 2023-08-07 DIAGNOSIS — M62830 Muscle spasm of back: Secondary | ICD-10-CM | POA: Diagnosis not present

## 2023-08-07 NOTE — Therapy (Signed)
 OUTPATIENT PHYSICAL THERAPY THORACOLUMBAR EVALUATION   Patient Name: Miguel Wolfe MRN: [id number] DOB:10-22-1945, 78 y.o., male Today's Date: 08/07/2023  END OF SESSION:  PT End of Session - 08/07/23 0915     Visit Number 2    Date for PT Re-Evaluation 10/14/23    Authorization Type Medicare    Progress Note Due on Visit 10    PT Start Time 0845    PT Stop Time 0925    PT Time Calculation (min) 40 min    Activity Tolerance Patient tolerated treatment well    Behavior During Therapy WFL for tasks assessed/performed               Past Medical History:  Diagnosis Date   Aortic insufficiency    Arthritis    CAD (coronary artery disease)    One Stent placed   Colon polyps    Dyspnea    With exertion related to 1/2 lung removed   Family history of adverse reaction to anesthesia    Mom vomits and Dad went nuts   Family history of colon cancer    Family history of stomach cancer    Fatigue    GERD (gastroesophageal reflux disease)    Hemorrhoids    History of echocardiogram    Echo 02/08/16: Mild LVH, EF 55-60, no RWMA, Gr 1 DD, trivial AI, mild LAE   Hyperlipidemia    Hypertension    Lung cancer (HCC)    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   PONV (postoperative nausea and vomiting)    Pre-diabetes    Pulmonary nodules    Thyroid  cancer (HCC)    thyroid  removed   Past Surgical History:  Procedure Laterality Date   BACK SURGERY     COLONOSCOPY WITH ESOPHAGOGASTRODUODENOSCOPY (EGD)  2008   CORONARY STENT INTERVENTION N/A 02/20/2016   Procedure: Coronary Stent Intervention;  Surgeon: Lonni JONETTA Cash, MD;  Location: [facility];  Service: Cardiovascular;  Laterality: N/A;   ESOPHAGOGASTRODUODENOSCOPY     HARDWARE REMOVAL Bilateral 05/10/2022   Procedure: REMOVAL OF THORACIC ELEVEN AND LUMBAR THREE PEDICLE SCREWS AND ROD SEGMENTS;  Surgeon: Lanis Pupa, MD;  Location: [facility] OR;  Service: Neurosurgery;  Laterality: Bilateral;  3C   INGUINAL HERNIA REPAIR Right  02/07/2022   Procedure: OPEN RIGHT INGUINAL HERNIA REPAIR WITH MESH;  Surgeon: Kinsinger, Herlene Righter, MD;  Location: [facility];  Service: General;  Laterality: Right;   LEFT HEART CATH AND CORONARY ANGIOGRAPHY N/A 02/20/2016   Procedure: Left Heart Cath and Coronary Angiography;  Surgeon: Lonni JONETTA Cash, MD;  Location: [facility] INVASIVE CV LAB;  Service: Cardiovascular;  Laterality: N/A;   LUNG REMOVAL, PARTIAL Left 06/24/2018   Pt states 1/2 of lung was removed   PILONIDAL CYST EXCISION     REFRACTIVE SURGERY Right    retina tear repair   THYROIDECTOMY N/A 01/17/2018   Procedure: TOTAL THYROIDECTOMY;  Surgeon: Eletha Boas, MD;  Location: [facility];  Service: General;  Laterality: N/A;   VIDEO ASSISTED THORACOSCOPY (VATS)/WEDGE RESECTION Left 06/24/2018   Procedure: VIDEO ASSISTED THORACOSCOPY (VATS)/LUNG RESECTION;  Surgeon: Army Dallas NOVAK, MD;  Location: [facility] OR;  Service: Thoracic;  Laterality: Left;   VIDEO BRONCHOSCOPY WITH ENDOBRONCHIAL NAVIGATION N/A 11/18/2017   Procedure: VIDEO BRONCHOSCOPY WITH ENDOBRONCHIAL NAVIGATION WITH BIOPSIES OF LEFT AND RIGHT UPPER LOBES;  Surgeon: Army Dallas NOVAK, MD;  Location: [facility] OR;  Service: Thoracic;  Laterality: N/A;   VIDEO BRONCHOSCOPY WITH ENDOBRONCHIAL NAVIGATION N/A 06/11/2018   Procedure: VIDEO BRONCHOSCOPY WITH ENDOBRONCHIAL NAVIGATION;  Surgeon: Army Dallas NOVAK, MD;  Location: [facility] OR;  Service: Thoracic;  Laterality: N/A;   Patient Active Problem List   Diagnosis Date Noted   Pain from implanted hardware 05/10/2022   L1 vertebral fracture (HCC) 11/14/2021   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   Genetic testing 01/15/2019   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   Family history of colon cancer    Family history of stomach cancer    Lung cancer, 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HCC) 06/25/2018   Lung nodule 06/24/2018   Multiple thyroid  nodules 01/15/2018   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   Coronary  artery disease involving native coronary artery of native heart without angina pectoris 04/23/2016   Hyperlipidemia    GERD (gastroesophageal reflux disease)    Essential hypertension 02/16/2016    PCP: Seabron, MD  REFERRING PROVIDER: Meade, PA  REFERRING DIAG: LBP  Rationale for Evaluation and Treatment: Rehabilitation  THERAPY DIAG:  Other low back pain  Muscle spasm of back  Pain in left hip  ONSET DATE: 04/15/23  SUBJECTIVE:                                                                                                                                                                                           SUBJECTIVE STATEMENT: Patient reports L hip is feeling better,  he is getting a second opinion regarding his lower back pain, has an L1 fracture but it is stable with the hardware intact.  Able to walk some with his wife now PERTINENT HISTORY:  November 2023 lumbar fusion, May 2025 removed some hardware, hx of lung cancer but is all clear at this time  PAIN:  Are you having pain? Yes: NPRS scale: 3/10 Pain location: low back primarily left Pain description: ache Aggravating factors: sitting, lying down, bending pain up to 6/10 Relieving factors: walking and moving, OTC pain med, heat at best pain a 2/10  PRECAUTIONS: None  RED FLAGS: None   WEIGHT BEARING RESTRICTIONS: No  FALLS:  Has patient fallen in last 6 months? No  LIVING ENVIRONMENT: Lives with: lives with their family Lives in: House/apartment Stairs: Yes: Internal: 15 steps; can reach both Has following equipment at home: None  OCCUPATION: retired  PLOF: Independent and walk 2 miles a day, some wood working, Social research officer, government  PATIENT GOALS: have less pain  NEXT MD VISIT: none scheduled  OBJECTIVE:  Note: Objective measures were completed at Evaluation unless otherwise noted. IMPRESSION: 1. Compression/burst fracture at L1 at the site of known intraosseous hemangioma has progressed since  prior examination in January 2025 resulting in approximately 75%  height loss. Moderate retropulsion into the canal resulting in moderate spinal canal stenosis. 2. Unchanged size and appearance of 2.8 cm paraspinal mass at the right aspect of the L1 vertebral body.     Electronically Signed   By: Clem Savory M.D.   On: 07/17/2023 11:03  PATIENT SURVEYS:  Modified Oswestry: Modified Oswestry Low Back Pain Disability Questionnaire: 27 / 50 = 54.0 % Minimally Clinically Important Difference (MCID) = 12.8%  COGNITION: Overall cognitive status: Within functional limits for tasks assessed     SENSATION: WFL  MUSCLE LENGTH:  SLR very tight 40 degrees, very tight piriformis  POSTURE: rounded shoulders, forward head, and decreased lumbar lordosis  PALPATION: Very tight in the lumbar area, tender L quadratus , non tender really over lateral hip , reports some snapping L lateral hip with prolonged walking  LUMBAR ROM:   AROM 07/22/23  Flexion Decreased 75%  Extension Decreased 50%  Right lateral flexion Decreased50%  Left lateral flexion Decreased 50%  Right rotation   Left rotation    (Blank rows = not tested)  LOWER EXTREMITY ROM:   very tight HS and piriformis   LOWER EXTREMITY MMT:  lean throughout with gross loss of muscle mass, loss of gluteal mass B L hip ER shaky and weak, 4/5 L hip flexion 3+/5 with pain in lower back  L hip abd 4/5 All other MMT 4+/5 B LE's   LUMBAR SPECIAL TESTS:  Straight leg raise test: Positive 40 degrees  FUNCTIONAL TESTS:  Timed up and go (TUG): 19 seconds 5 x sit to stand 12.01 sec GAIT:   TREATMENT DATE:  08/07/23: Provided with additional 1/8 lift R shoe, with rubber material as the cork initially provided is breaking down Side lying for deep massage B quadratus lumborum., and B post gluteals with theragun Leg press B hips 20# 10 X 2 SEAT 8 Staggered leg press , 20# 10 x each foot   Seated for attempted long arc quads,  initially with 3# each leg, then reduced to 1#, pt with much back pain,   The pt is going to the pool regularly and has a routine that he followed after his initial surgery, instructed him to try deep squats in the pool and donkey kicks, holding the wall. We discussed having him attend aquatic Pt at [facility], pt deferred, stated too far to drive .  07/22/23 Reassessment: t as above, 5 x sit to stand, ROM,strength R side lying for deep cross friction massage L quadratus lumborum Leg press B hips 20# 10 X 2 SEAT 8 R Side lying L hip clam shell 10 x 2 Education in self care, walking in pool at different depths for the bouyancy , to assist with decompression of spine Also education to possibly obtain LS corset , and use to provide with decompression of LS spine as well        ASSESSMENT:  CLINICAL IMPRESSION: Eval;Patient is a 78 y.o. male who returned today for PT after last sessions assessment and treatment for L lateral hip pain. He reports improved L lateral hip pain, has been able to resume walking with his wife for bout 2o to 30 min at a time.  Reports L1 fx and he is seeing another MD for second opinion.  Very painful in back when initially lying down.  Can tolerate hip, lumbar , Le strength much better than on land which corresponds with the L1 fx. Attempted light quads strengthening but marked lower back pain most likely neural tension,  so advised on other ex to perform at the pool .Should benefit from PT , needs to address decompression ex and stretching, strengthening to reduce compression of LS spine with results of recent MRI noting further progression of L1 compression fx.   OBJECTIVE IMPAIRMENTS: Abnormal gait, cardiopulmonary status limiting activity, decreased activity tolerance, decreased balance, decreased endurance, decreased mobility, difficulty walking, decreased ROM, decreased strength, increased fascial restrictions, increased muscle spasms, impaired flexibility, improper  body mechanics, postural dysfunction, and pain.  REHAB POTENTIAL: Good  CLINICAL DECISION MAKING: Stable/uncomplicated  EVALUATION COMPLEXITY: Low   GOALS: Goals reviewed with patient? Yes  SHORT TERM GOALS: Target date: 2 weeks, 08/05/23:  Independent with initial HEP Baseline: Goal status: met 05/14/23  LONG TERM GOALS: Target date: 12 weeks, 10/09/23  Independent with advanced HEP Baseline:  Goal status: INITIAL  2.  Understand posture and body mechanics Baseline:  Goal status: INITIAL  3.  Decrease pain L lateral hip with walking by 25% Baseline:  Goal status: INITIAL  4.  Pain free resistance and normal strength L hip flexion,current 3+/5 with pain =goal status, Initial  PLAN:  PT FREQUENCY: 1-2x/week  PT DURATION: 12 weeks  PLANNED INTERVENTIONS: 97164- PT Re-evaluation, 97110-Therapeutic exercises, 97530- Therapeutic activity, [id number]- Neuromuscular re-education, 97535- Self Care, 02859- Manual therapy, G0283- Electrical stimulation (unattended), [phone]- Ionotophoresis 4mg /ml Dexamethasone , Patient/Family education, Taping, Dry Needling, Cryotherapy, and Moist heat.  PLAN FOR NEXT SESSION: some core and flexibility, manual glut strength  Jaidalyn Schillo L Kin Galbraith, PT, DPT, OCS 08/07/2023, 3:52 PM

## 2023-08-12 ENCOUNTER — Inpatient Hospital Stay: Payer: Medicare Other | Attending: Internal Medicine | Admitting: Internal Medicine

## 2023-08-12 VITALS — BP 136/84 | HR 65 | Temp 98.0°F | Resp 16 | Ht 71.0 in | Wt 160.0 lb

## 2023-08-12 DIAGNOSIS — Z08 Encounter for follow-up examination after completed treatment for malignant neoplasm: Secondary | ICD-10-CM | POA: Diagnosis not present

## 2023-08-12 DIAGNOSIS — C3490 Malignant neoplasm of unspecified part of unspecified bronchus or lung: Secondary | ICD-10-CM | POA: Diagnosis not present

## 2023-08-12 DIAGNOSIS — Z800 Family history of malignant neoplasm of digestive organs: Secondary | ICD-10-CM | POA: Insufficient documentation

## 2023-08-12 DIAGNOSIS — Z902 Acquired absence of lung [part of]: Secondary | ICD-10-CM | POA: Diagnosis not present

## 2023-08-12 DIAGNOSIS — Z85118 Personal history of other malignant neoplasm of bronchus and lung: Secondary | ICD-10-CM | POA: Insufficient documentation

## 2023-08-12 DIAGNOSIS — D1809 Hemangioma of other sites: Secondary | ICD-10-CM | POA: Diagnosis not present

## 2023-08-12 DIAGNOSIS — R918 Other nonspecific abnormal finding of lung field: Secondary | ICD-10-CM | POA: Insufficient documentation

## 2023-08-12 DIAGNOSIS — M545 Low back pain, unspecified: Secondary | ICD-10-CM | POA: Diagnosis not present

## 2023-08-12 NOTE — Progress Notes (Signed)
 [facility] [phone]   [phone]  OFFICE PROGRESS NOTE  Seabron Lenis, MD [address] [place] [address]  DIAGNOSIS: Stage IA (T1c, N0, M0) non-small cell lung cancer, adenocarcinoma with no actionable mutation and negative PDL 1 expression diagnosed in June 2020.   PRIOR THERAPY: Status post left upper lobectomy with lymph node dissection on June 24, 2018.  CURRENT THERAPY: Observation.  INTERVAL HISTORY: Miguel Wolfe 78 y.o. male returns to the clinic today for follow-up visit accompanied by his wife.Discussed the use of AI scribe software for clinical note transcription with the patient, who gave verbal consent to proceed.  History of Present Illness Miguel Wolfe is a 78 year old male with stage one non-small cell lung cancer who presents for evaluation with repeat CT scan of the chest for re-station of his disease. He is accompanied by his wife.  He was diagnosed with stage one non-small cell lung cancer, adenocarcinoma, in June 2020 and underwent a left upper lobectomy with lymph node dissection the same month. Since then, he has been on observation. He is concerned about the nodule getting darker. He had a PET scan in January, which did not provide additional information.  He feels okay chest-wise but has significant back discomfort due to a known hemangioma and arthritis. No new symptoms related to his lung condition are reported.  It has been over five years since his initial diagnosis. He discusses the option of returning for follow-up in six months or a year.      MEDICAL HISTORY: Past Medical History:  Diagnosis Date   Aortic insufficiency    Arthritis    CAD (coronary artery disease)    One Stent placed   Colon polyps    Dyspnea    With exertion related to 1/2 lung removed   Family history of adverse reaction to anesthesia    Mom vomits and Dad went nuts   Family history of colon cancer     Family history of stomach cancer    Fatigue    GERD (gastroesophageal reflux disease)    Hemorrhoids    History of echocardiogram    Echo 02/08/16: Mild LVH, EF 55-60, no RWMA, Gr 1 DD, trivial AI, mild LAE   Hyperlipidemia    Hypertension    Lung cancer (HCC)    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   PONV (postoperative nausea and vomiting)    Pre-diabetes    Pulmonary nodules    Thyroid  cancer (HCC)    thyroid  removed    ALLERGIES:  is allergic to propranolol , gemfibrozil, niaspan [niacin er (antihyperlipidemic)], and simvastatin.  MEDICATIONS:  Current Outpatient Medications  Medication Sig Dispense Refill   acetaminophen  (TYLENOL ) 500 MG tablet Take 500 mg by mouth every 6 (six) hours as needed for moderate pain (Back pain).     amLODipine  (NORVASC ) 10 MG tablet Take 10 mg by mouth daily.     aspirin  EC 81 MG tablet Take 81 mg by mouth at bedtime.     clobetasol (TEMOVATE) 0.05 % external solution Apply 1 Application topically daily.     Coenzyme Q10 (COQ10) 100 MG CAPS Take 100 mg by mouth daily.     ezetimibe  (ZETIA ) 10 MG tablet Take 1 tablet (10 mg total) by mouth daily. 90 tablet 3   famotidine  (PEPCID ) 20 MG tablet Take 20 mg by mouth daily.     fluticasone  (FLONASE ) 50 MCG/ACT nasal spray Place 1 spray into both  nostrils daily as needed for allergies or rhinitis (Spring and Fall).     ibuprofen  (ADVIL ) 200 MG tablet Take 200 mg by mouth every 6 (six) hours as needed for moderate pain.     ketoconazole (NIZORAL) 2 % shampoo Apply topically as needed for irritation.     levothyroxine  (SYNTHROID ) 125 MCG tablet Take 125 mcg by mouth daily before breakfast.     LORazepam  (ATIVAN ) 0.5 MG tablet Take 1 tablet (0.5 mg total) by mouth daily as needed for anxiety (ONLY FOR MRIs). 2 tablet 0   losartan  (COZAAR ) 100 MG tablet Take 100 mg by mouth daily.     nitroGLYCERIN  (NITROSTAT ) 0.4 MG SL tablet PLACE 1 TABLET UNDER THE TONGUE EVERY 5 MINUTES AS NEEDED FOR CHEST PAIN 25 tablet 11   omega-3 acid  ethyl esters (LOVAZA ) 1 g capsule Take 2 g by mouth in the morning and at bedtime.     Omega-3 Fatty Acids (FISH OIL) 1000 MG CAPS Take 2,000 mg by mouth in the morning and at bedtime.     pantoprazole  (PROTONIX ) 40 MG tablet Take 40 mg by mouth every evening.     pravastatin  (PRAVACHOL ) 80 MG tablet TAKE 1 TABLET(80 MG) BY MOUTH DAILY 90 tablet 2   propranolol  (INDERAL ) 20 MG tablet Take 1 tablet (20 mg total) by mouth 2 (two) times daily. 180 tablet 3   zolpidem  (AMBIEN ) 10 MG tablet Take 10 mg by mouth at bedtime.  0   No current facility-administered medications for this visit.    SURGICAL HISTORY:  Past Surgical History:  Procedure Laterality Date   BACK SURGERY     COLONOSCOPY WITH ESOPHAGOGASTRODUODENOSCOPY (EGD)  2008   CORONARY STENT INTERVENTION N/A 02/20/2016   Procedure: Coronary Stent Intervention;  Surgeon: Lonni JONETTA Cash, MD;  Location: [facility] INVASIVE CV LAB;  Service: Cardiovascular;  Laterality: N/A;   ESOPHAGOGASTRODUODENOSCOPY     HARDWARE REMOVAL Bilateral 05/10/2022   Procedure: REMOVAL OF THORACIC ELEVEN AND LUMBAR THREE PEDICLE SCREWS AND ROD SEGMENTS;  Surgeon: Lanis Pupa, MD;  Location: [facility] OR;  Service: Neurosurgery;  Laterality: Bilateral;  3C   INGUINAL HERNIA REPAIR Right 02/07/2022   Procedure: OPEN RIGHT INGUINAL HERNIA REPAIR WITH MESH;  Surgeon: Kinsinger, Herlene Righter, MD;  Location: [facility] ORS;  Service: General;  Laterality: Right;   LEFT HEART CATH AND CORONARY ANGIOGRAPHY N/A 02/20/2016   Procedure: Left Heart Cath and Coronary Angiography;  Surgeon: Lonni JONETTA Cash, MD;  Location: [facility] INVASIVE CV LAB;  Service: Cardiovascular;  Laterality: N/A;   LUNG REMOVAL, PARTIAL Left 06/24/2018   Pt states 1/2 of lung was removed   PILONIDAL CYST EXCISION     REFRACTIVE SURGERY Right    retina tear repair   THYROIDECTOMY N/A 01/17/2018   Procedure: TOTAL THYROIDECTOMY;  Surgeon: Eletha Boas, MD;  Location: [facility] ORS;  Service: General;  Laterality:  N/A;   VIDEO ASSISTED THORACOSCOPY (VATS)/WEDGE RESECTION Left 06/24/2018   Procedure: VIDEO ASSISTED THORACOSCOPY (VATS)/LUNG RESECTION;  Surgeon: Army Dallas NOVAK, MD;  Location: [facility] OR;  Service: Thoracic;  Laterality: Left;   VIDEO BRONCHOSCOPY WITH ENDOBRONCHIAL NAVIGATION N/A 11/18/2017   Procedure: VIDEO BRONCHOSCOPY WITH ENDOBRONCHIAL NAVIGATION WITH BIOPSIES OF LEFT AND RIGHT UPPER LOBES;  Surgeon: Army Dallas NOVAK, MD;  Location: [facility] OR;  Service: Thoracic;  Laterality: N/A;   VIDEO BRONCHOSCOPY WITH ENDOBRONCHIAL NAVIGATION N/A 06/11/2018   Procedure: VIDEO BRONCHOSCOPY WITH ENDOBRONCHIAL NAVIGATION;  Surgeon: Army Dallas NOVAK, MD;  Location: [facility] OR;  Service: Thoracic;  Laterality: N/A;  REVIEW OF SYSTEMS:  Constitutional: negative Eyes: negative Ears, nose, mouth, throat, and face: negative Respiratory: negative Cardiovascular: negative Gastrointestinal: negative Genitourinary:negative Integument/breast: negative Hematologic/lymphatic: negative Musculoskeletal:positive for back pain Neurological: negative Behavioral/Psych: negative Endocrine: negative Allergic/Immunologic: negative   PHYSICAL EXAMINATION: General appearance: alert, cooperative, and no distress Head: Normocephalic, without obvious abnormality, atraumatic Neck: no adenopathy, no JVD, supple, symmetrical, trachea midline, and thyroid  not enlarged, symmetric, no tenderness/mass/nodules Lymph nodes: Cervical, supraclavicular, and axillary nodes normal. Resp: clear to auscultation bilaterally Back: symmetric, no curvature. ROM normal. No CVA tenderness. Cardio: regular rate and rhythm, S1, S2 normal, no murmur, click, rub or gallop GI: soft, non-tender; bowel sounds normal; no masses,  no organomegaly Extremities: extremities normal, atraumatic, no cyanosis or edema Neurologic: Alert and oriented X 3, normal strength and tone. Normal symmetric reflexes. Normal coordination and gait  ECOG PERFORMANCE  STATUS: 1 - Symptomatic but completely ambulatory  Blood pressure 136/84, pulse 65, temperature 98 F (36.7 C), temperature source Temporal, resp. rate 16, height 5' 11 (1.803 m), weight 160 lb (72.6 kg), SpO2 99%.  LABORATORY DATA: Lab Results  Component Value Date   WBC 6.1 08/05/2023   HGB 14.7 08/05/2023   HCT 43.6 08/05/2023   MCV 88.6 08/05/2023   PLT 166 08/05/2023      Chemistry      Component Value Date/Time   NA 142 08/05/2023 0749   NA 143 04/24/2016 0935   K 3.6 08/05/2023 0749   CL 104 08/05/2023 0749   CO2 33 (H) 08/05/2023 0749   BUN 13 08/05/2023 0749   BUN 17 04/24/2016 0935   CREATININE 0.80 08/05/2023 0749      Component Value Date/Time   CALCIUM  9.2 08/05/2023 0749   ALKPHOS 65 08/05/2023 0749   AST 20 08/05/2023 0749   ALT 14 08/05/2023 0749   BILITOT 0.8 08/05/2023 0749       RADIOGRAPHIC STUDIES: CT Chest W Contrast Result Date: 08/05/2023 CLINICAL DATA:  Staging non-small-cell lung cancer. Previous left upper lobectomy. * Tracking Code: BO *. EXAM: CT CHEST WITH CONTRAST TECHNIQUE: Multidetector CT imaging of the chest was performed during intravenous contrast administration. RADIATION DOSE REDUCTION: This exam was performed according to the departmental dose-optimization program which includes automated exposure control, adjustment of the mA and/or kV according to patient size and/or use of iterative reconstruction technique. CONTRAST:  OMNIPAQUE  IOHEXOL  300 MG/ML  SOLN COMPARISON:  PET-CT 02/01/2023. Standard CT 01/15/2023. Older exams as well FINDINGS: Cardiovascular: Heart is nonenlarged. No pericardial effusion. The thoracic aorta has a normal course and caliber with some scattered partially calcified atherosclerotic plaque. Pulsation artifact along the ascending aorta. Coronary artery calcifications are seen. Mediastinum/Nodes: Surgical changes in the area of the thyroid  bed. Normal caliber thoracic esophagus which is slightly patulous. No  specific abnormal lymph node enlargement identified in the axillary regions, hilum or mediastinum. Lungs/Pleura: Surgical changes identified from left upper lobectomy. Associated pleural thickening, scarring and volume loss. In the right upper lobe of the lung is an ill-defined ground-glass, nodular area. In January 2025 this was measured at 2.8 x 1.1 cm. Today when measured in a similar fashion for continuity this measures 2.9 by 1.1 cm on series 6, image 34. Going back to an older study of February 2023 this area of a appears slightly more confluent and dense compared to that examination. In addition on the recent PET-CT this area does not have significant abnormal radiotracer uptake. Recommend simple continued surveillance. Otherwise no consolidation, pneumothorax or effusion. Upper Abdomen: Adrenal  glands are preserved. Musculoskeletal: Osteopenia. Scattered degenerative changes. There is severe compression of the L1 level with posterior fixation hardware and pedicle screws at T12 and L2. This process was seen in January 2025 with compression is significantly increased. Please correlate with MRI of 07/16/2023. IMPRESSION: Overall stable surgical changes of left-sided lobectomy. No developing new mass lesion or nodal enlargement. The ill-defined opacity at the right lung apex is relatively similar to previous examination of January 2025. Minimally progressive from February 2023. Recommend continued surveillance. Compression fracture involving L1 with progressive vertebral body height loss compared to the prior CT but please correlate with the more recent MRI of the lumbar spine of 07/16/2023. Associated fixation hardware. Aortic Atherosclerosis (ICD10-I70.0). Electronically Signed   By: Ranell Bring M.D.   On: 08/05/2023 17:38   MR Lumbar Spine W Wo Contrast Result Date: 07/17/2023 CLINICAL DATA:  Bone mass or bone pain, follow-up treated hemangioma EXAM: MRI LUMBAR SPINE WITHOUT AND WITH CONTRAST TECHNIQUE:  Multiplanar and multiecho pulse sequences of the lumbar spine were obtained without and with intravenous contrast. CONTRAST:  7 mL of vueway  IV COMPARISON:  MRI of the lumbar spine dated 01/29/2023 FINDINGS: Segmentation: Standard. Alignment:  Physiologic lumbar alignment is maintained. Vertebrae: Operative changes of posterior fusion from T12 through L2. Compression/burst fracture at L1 at the site of known intraosseous hemangioma has progressed since prior examination in January 2025 resulting in proximally 75% height loss. There is moderate retropulsion into the canal. Vertebral bodies demonstrate normal signal intensity. Conus medullaris and cauda equina: The conus medullaris terminates at the level of L1-L2. The distal spinal cord signal intensity is normal. Paraspinal and other soft tissues: Unchanged size and appearance of 2.8 cm paraspinal mass at the right aspect of the L1 vertebral body. The visualized aorta is normal. Disc levels: L1-L2: Disc is normal in configuration. No facet arthropathy. No neuroforaminal stenosis. Moderate spinal canal stenosis secondary to retropulsion L2-L3: Disc is normal in configuration. No facet arthropathy. No neuroforaminal stenosis. No spinal canal stenosis. L3-L4: Disc is normal in configuration. No facet arthropathy. No neuroforaminal stenosis. No spinal canal stenosis. L4-L5: Disc is normal in configuration. No facet arthropathy. No neuroforaminal stenosis. No spinal canal stenosis. L5-S1: Mild disc bulge. Mild bilateral facet arthropathy. Mild bilateral neuroforaminal stenosis. No spinal canal stenosis. IMPRESSION: 1. Compression/burst fracture at L1 at the site of known intraosseous hemangioma has progressed since prior examination in January 2025 resulting in approximately 75% height loss. Moderate retropulsion into the canal resulting in moderate spinal canal stenosis. 2. Unchanged size and appearance of 2.8 cm paraspinal mass at the right aspect of the L1 vertebral  body. Electronically Signed   By: Clem Savory M.D.   On: 07/17/2023 11:03     ASSESSMENT AND PLAN: This is a very pleasant 78 years old white male with stage Ia non-small cell lung cancer, adenocarcinoma status post left upper lobectomy with lymph node dissection under the care of Dr. Army in June 2020. The patient is currently on observation and he is feeling fine except for intermittent pain on the left side of the chest from the previous surgical scar. His previous CT scan of the chest showed stable subsolid right apical opacity measuring 2.8 x 1.1 cm still suspicious for slowly growing adenocarcinoma. A PET was performed scan in July 2025 and it showed the elongated nodule with the peripheral groundglass density in the right upper lobe has very low metabolic activity and the findings not typical of primary bronchogenic carcinoma however low-grade adenocarcinoma could  not be excluded.  There was no other evidence of metastatic disease or recurrence in the lung. He is currently on observation. He had repeat CT scan of the chest performed recently.  I personally and independently reviewed the scan and discussed the results with the patient today.  His scan showed no concerning findings for disease recurrence or metastasis. Assessment and Plan Assessment & Plan History of left upper lobe lung adenocarcinoma status post lobectomy with stable pulmonary nodule under surveillance Stage I non-small cell lung cancer, adenocarcinoma, diagnosed in June 2020, status post left upper lobectomy with lymph node dissection. Recent CT scan shows a stable pulmonary nodule measuring 2.9 x 1.1 cm, slightly smaller than the previous measurement in January. The nodule's size is with no concerning changes in appearance. No indication for biopsy or PET scan at this time. - Continue surveillance with CT scan in 6 months. - Consider biopsy if the nodule shows growth or increased solidity. - Consider radiation therapy  if biopsy confirms malignancy. The patient was advised to call immediately if he has any concerning symptoms in the interval.  The patient voices understanding of current disease status and treatment options and is in agreement with the current care plan.  All questions were answered. The patient knows to call the clinic with any problems, questions or concerns. We can certainly see the patient much sooner if necessary.  The total time spent in the appointment was 30 minutes.  Disclaimer: This note was dictated with voice recognition software. Similar sounding words can inadvertently be transcribed and may not be corrected upon review.

## 2023-08-13 ENCOUNTER — Telehealth: Payer: Self-pay | Admitting: Radiation Oncology

## 2023-08-13 NOTE — Telephone Encounter (Signed)
 Pt called to schedule annual f/u with Dr. Izell. Pt requested 01/29/24 with Dr. Izell. Pt also asked if she had put in request for MRI Lumbar Spine orders; no orders seen but pt was advised I would reach out to provider regarding this. Pt was grateful and requested if possible to please make appt with [facility].

## 2023-08-14 ENCOUNTER — Encounter: Admitting: Physical Therapy

## 2023-08-19 ENCOUNTER — Other Ambulatory Visit: Payer: Self-pay | Admitting: Radiation Therapy

## 2023-08-20 DIAGNOSIS — L219 Seborrheic dermatitis, unspecified: Secondary | ICD-10-CM | POA: Diagnosis not present

## 2023-08-20 DIAGNOSIS — L578 Other skin changes due to chronic exposure to nonionizing radiation: Secondary | ICD-10-CM | POA: Diagnosis not present

## 2023-08-20 DIAGNOSIS — Z86018 Personal history of other benign neoplasm: Secondary | ICD-10-CM | POA: Diagnosis not present

## 2023-08-20 DIAGNOSIS — L570 Actinic keratosis: Secondary | ICD-10-CM | POA: Diagnosis not present

## 2023-08-20 DIAGNOSIS — L661 Lichen planopilaris, unspecified: Secondary | ICD-10-CM | POA: Diagnosis not present

## 2023-08-20 DIAGNOSIS — D225 Melanocytic nevi of trunk: Secondary | ICD-10-CM | POA: Diagnosis not present

## 2023-08-20 DIAGNOSIS — L821 Other seborrheic keratosis: Secondary | ICD-10-CM | POA: Diagnosis not present

## 2023-08-20 DIAGNOSIS — Z85828 Personal history of other malignant neoplasm of skin: Secondary | ICD-10-CM | POA: Diagnosis not present

## 2023-08-20 DIAGNOSIS — D1801 Hemangioma of skin and subcutaneous tissue: Secondary | ICD-10-CM | POA: Diagnosis not present

## 2023-08-20 DIAGNOSIS — D2272 Melanocytic nevi of left lower limb, including hip: Secondary | ICD-10-CM | POA: Diagnosis not present

## 2023-08-21 ENCOUNTER — Encounter: Admitting: Physical Therapy

## 2023-08-22 DIAGNOSIS — E782 Mixed hyperlipidemia: Secondary | ICD-10-CM | POA: Diagnosis not present

## 2023-08-22 DIAGNOSIS — E890 Postprocedural hypothyroidism: Secondary | ICD-10-CM | POA: Diagnosis not present

## 2023-08-22 DIAGNOSIS — R7303 Prediabetes: Secondary | ICD-10-CM | POA: Diagnosis not present

## 2023-08-27 DIAGNOSIS — R7303 Prediabetes: Secondary | ICD-10-CM | POA: Diagnosis not present

## 2023-08-27 DIAGNOSIS — Z1331 Encounter for screening for depression: Secondary | ICD-10-CM | POA: Diagnosis not present

## 2023-08-27 DIAGNOSIS — Z0000 Encounter for general adult medical examination without abnormal findings: Secondary | ICD-10-CM | POA: Diagnosis not present

## 2023-08-27 DIAGNOSIS — I10 Essential (primary) hypertension: Secondary | ICD-10-CM | POA: Diagnosis not present

## 2023-08-27 DIAGNOSIS — Z85118 Personal history of other malignant neoplasm of bronchus and lung: Secondary | ICD-10-CM | POA: Diagnosis not present

## 2023-08-27 DIAGNOSIS — G4700 Insomnia, unspecified: Secondary | ICD-10-CM | POA: Diagnosis not present

## 2023-08-27 DIAGNOSIS — E782 Mixed hyperlipidemia: Secondary | ICD-10-CM | POA: Diagnosis not present

## 2023-08-27 DIAGNOSIS — K219 Gastro-esophageal reflux disease without esophagitis: Secondary | ICD-10-CM | POA: Diagnosis not present

## 2023-08-27 DIAGNOSIS — I25111 Atherosclerotic heart disease of native coronary artery with angina pectoris with documented spasm: Secondary | ICD-10-CM | POA: Diagnosis not present

## 2023-08-27 DIAGNOSIS — F419 Anxiety disorder, unspecified: Secondary | ICD-10-CM | POA: Diagnosis not present

## 2023-08-27 DIAGNOSIS — M545 Low back pain, unspecified: Secondary | ICD-10-CM | POA: Diagnosis not present

## 2023-08-27 DIAGNOSIS — E890 Postprocedural hypothyroidism: Secondary | ICD-10-CM | POA: Diagnosis not present

## 2023-08-28 ENCOUNTER — Encounter: Admitting: Physical Therapy

## 2023-09-23 ENCOUNTER — Other Ambulatory Visit: Payer: Self-pay | Admitting: Cardiology

## 2023-09-23 DIAGNOSIS — E7800 Pure hypercholesterolemia, unspecified: Secondary | ICD-10-CM

## 2023-10-10 ENCOUNTER — Ambulatory Visit: Attending: Cardiology | Admitting: Cardiology

## 2023-10-10 ENCOUNTER — Telehealth: Payer: Self-pay | Admitting: Pharmacy Technician

## 2023-10-10 ENCOUNTER — Telehealth: Payer: Self-pay | Admitting: Pharmacist

## 2023-10-10 ENCOUNTER — Other Ambulatory Visit: Payer: Self-pay | Admitting: *Deleted

## 2023-10-10 ENCOUNTER — Other Ambulatory Visit (HOSPITAL_COMMUNITY): Payer: Self-pay

## 2023-10-10 ENCOUNTER — Encounter: Payer: Self-pay | Admitting: Cardiology

## 2023-10-10 VITALS — BP 124/62 | HR 64 | Resp 14 | Ht 70.0 in | Wt 162.0 lb

## 2023-10-10 DIAGNOSIS — Z79899 Other long term (current) drug therapy: Secondary | ICD-10-CM

## 2023-10-10 DIAGNOSIS — C3412 Malignant neoplasm of upper lobe, left bronchus or lung: Secondary | ICD-10-CM | POA: Insufficient documentation

## 2023-10-10 DIAGNOSIS — E7800 Pure hypercholesterolemia, unspecified: Secondary | ICD-10-CM | POA: Diagnosis not present

## 2023-10-10 DIAGNOSIS — I351 Nonrheumatic aortic (valve) insufficiency: Secondary | ICD-10-CM | POA: Diagnosis not present

## 2023-10-10 DIAGNOSIS — I2510 Atherosclerotic heart disease of native coronary artery without angina pectoris: Secondary | ICD-10-CM | POA: Insufficient documentation

## 2023-10-10 MED ORDER — NEXLIZET 180-10 MG PO TABS: 1.0000 | ORAL_TABLET | Freq: Every day | ORAL | 3 refills | Status: AC

## 2023-10-10 NOTE — Progress Notes (Signed)
 Cardiology Office Note:  .   Date:  10/10/2023  ID:  MARCIN HOLTE, DOB Dec 12, 1945, MRN [id number] PCP: Seabron Lenis, MD  [facility] Providers Cardiologist:  Oneil Parchment, MD     History of Present Illness: .   Miguel Wolfe is a 78 y.o. male Discussed the use of AI scribe software  History of Present Illness Miguel Wolfe is a 78 year old male with coronary artery disease who presents for follow-up.  He underwent mid RCA drug-eluting stent placement in February 2018. He experiences occasional discomfort but not specifically during physical activities. Before his heart issue in 2018, he was tired and short of breath but did not experience significant pain. A stress test conducted a couple of years ago was reassuring.  He has hyperlipidemia with an LDL of 76. He is intolerant to rosuvastatin  and atorvastatin  and is currently on Zetia  and pravastatin .  He has mild to moderate aortic insufficiency with mild calcification of the aortic valve. His ejection fraction was normal as of May 28, 2022, and has remained unchanged.  He was diagnosed with stage one non-small cell lung cancer adenocarcinoma in June 2020 and underwent left upper lobectomy with lymph node dissection on June 24, 2018. He has been cancer-free since then.  He reports significant back pain, which has led to multiple surgeries and the placement of rods in his back. This chronic pain impacts his ability to play golf, though he continues to engage in woodworking and walking. He experiences sleep disturbances due to the pain, affecting his energy levels during the day. He occasionally takes Advil , about two to three times a week, and has used a medication given by another doctor a couple of times this year for his back pain.    Studies Reviewed: .        Results LABS LDL: 73 (08/2023)  DIAGNOSTIC Echocardiogram: Normal ejection fraction (05/28/2022) Risk Assessment/Calculations:            Physical Exam:    VS:  BP 124/62 (BP Location: Left Arm, Patient Position: Sitting, Cuff Size: Normal)   Pulse 64   Resp 14   Ht 5' 10 (1.778 m)   Wt 162 lb (73.5 kg)   SpO2 98%   BMI 23.24 kg/m    Wt Readings from Last 3 Encounters:  10/10/23 162 lb (73.5 kg)  08/12/23 160 lb (72.6 kg)  03/07/23 165 lb (74.8 kg)    GEN: Well nourished, well developed in no acute distress NECK: No JVD; No carotid bruits CARDIAC: RRR, no murmurs, no rubs, no gallops RESPIRATORY:  Clear to auscultation without rales, wheezing or rhonchi  ABDOMEN: Soft, non-tender, non-distended EXTREMITIES:  No edema; No deformity   ASSESSMENT AND PLAN: .    Assessment and Plan Assessment & Plan Coronary artery disease, status post mid RCA drug-eluting stent Status post mid RCA drug-eluting stent placement in February 2018. No current chest pain, fevers, or chills. Occasional discomfort not associated with exertion. Previous stress test was reassuring. No significant changes in symptoms since last evaluation. - Consider non-invasive testing if symptoms worsen.  Hyperlipidemia with statin intolerance LDL cholesterol level is 76 mg/dL, above the target of less than 55 mg/dL for patients with a history of heart attacks. Intolerant to rosuvastatin  and atorvastatin . Currently on Zetia  and pravastatin . Discussed options for further lowering LDL, including bempedoic acid (Nexlizet) and PCSK9 inhibitors. He prefers to try bempedoic acid first. Discussed potential need for preauthorization and insurance challenges with new medication. -  Prescribe bempedoic acid (Nexlizet) in addition to current regimen of Zetia  and pravastatin . - Coordinate with pharmacy team for preauthorization and education on new medication. - Evaluate lipid panel in 6 months to assess efficacy of bempedoic acid.  Aortic regurgitation with mild aortic valve calcification Mild to moderate aortic insufficiency with mild calcification of the aortic valve. No significant  murmur detected on examination. Normal ejection fraction on previous echocardiogram. Unlikely to require valve replacement in the future. - Continue routine monitoring of aortic valve function.         Dispo: 6 mth  Signed, Oneil Parchment, MD

## 2023-10-10 NOTE — Telephone Encounter (Signed)
 Pharmacy Patient Advocate Encounter  Received notification from [employer] that Prior Authorization for NEXLIZET has been APPROVED from 09/10/23 to 10/09/24. Ran test claim, Copay is $158.49 FOR 30 DAYS . This test claim was processed through [facility]- copay amounts may vary at other pharmacies due to pharmacy/plan contracts, or as the patient moves through the different stages of their insurance plan.   PA #/Case ID/Reference #: [id number]

## 2023-10-10 NOTE — Telephone Encounter (Signed)
   Pharmacy Patient Advocate Encounter   Received notification from Pt Calls Messages that prior authorization for NEXLIZET is required/requested.   Insurance verification completed.   The patient is insured through [employer].   Per test claim: PA required; PA submitted to above mentioned insurance via Latent Key/confirmation #/EOC [id number] Status is pending

## 2023-10-10 NOTE — Patient Instructions (Signed)
 Medication Instructions:  Please discontinue your Zetia  and start Nexlizrt. Continue all other medications as listed.  *If you need a refill on your cardiac medications before your next appointment, please call your pharmacy*  Lab Work: Please have Lipid panel in 6 months.  If you have labs (blood work) drawn today and your tests are completely normal, you will receive your results only by: MyChart Message (if you have MyChart) OR A paper copy in the mail If you have any lab test that is abnormal or we need to change your treatment, we will call you to review the results.  Follow-Up: At [facility], you and your health needs are our priority.  As part of our continuing mission to provide you with exceptional heart care, our providers are all part of one team.  This team includes your primary Cardiologist (physician) and Advanced Practice Providers or APPs (Physician Assistants and Nurse Practitioners) who all work together to provide you with the care you need, when you need it.  Your next appointment:   6 month(s)  Provider:   Oneil Parchment, MD    We recommend signing up for the patient portal called MyChart.  Sign up information is provided on this After Visit Summary.  MyChart is used to connect with patients for Virtual Visits (Telemedicine).  Patients are able to view lab/test results, encounter notes, upcoming appointments, etc.  Non-urgent messages can be sent to your provider as well.   To learn more about what you can do with MyChart, go to [email or web address].

## 2023-10-18 DIAGNOSIS — Z23 Encounter for immunization: Secondary | ICD-10-CM | POA: Diagnosis not present

## 2023-10-23 ENCOUNTER — Other Ambulatory Visit: Payer: Self-pay | Admitting: Cardiology

## 2023-10-25 NOTE — Telephone Encounter (Signed)
 PA for Nexlizet approved see OV notes for more info

## 2023-11-07 DIAGNOSIS — Z23 Encounter for immunization: Secondary | ICD-10-CM | POA: Diagnosis not present

## 2023-11-08 ENCOUNTER — Other Ambulatory Visit: Payer: Self-pay | Admitting: Radiation Therapy

## 2023-11-08 DIAGNOSIS — D1809 Hemangioma of other sites: Secondary | ICD-10-CM

## 2023-11-13 ENCOUNTER — Other Ambulatory Visit: Payer: Self-pay | Admitting: Radiation Oncology

## 2023-11-13 DIAGNOSIS — C7951 Secondary malignant neoplasm of bone: Secondary | ICD-10-CM

## 2023-12-10 DIAGNOSIS — L249 Irritant contact dermatitis, unspecified cause: Secondary | ICD-10-CM | POA: Diagnosis not present

## 2023-12-10 DIAGNOSIS — M67472 Ganglion, left ankle and foot: Secondary | ICD-10-CM | POA: Diagnosis not present

## 2023-12-10 DIAGNOSIS — B009 Herpesviral infection, unspecified: Secondary | ICD-10-CM | POA: Diagnosis not present

## 2023-12-10 DIAGNOSIS — L720 Epidermal cyst: Secondary | ICD-10-CM | POA: Diagnosis not present

## 2023-12-10 DIAGNOSIS — L570 Actinic keratosis: Secondary | ICD-10-CM | POA: Diagnosis not present

## 2024-01-21 ENCOUNTER — Ambulatory Visit
Admission: RE | Admit: 2024-01-21 | Discharge: 2024-01-21 | Disposition: A | Source: Ambulatory Visit | Attending: Radiation Oncology | Admitting: Radiation Oncology

## 2024-01-21 DIAGNOSIS — D1809 Hemangioma of other sites: Secondary | ICD-10-CM

## 2024-01-21 MED ORDER — GADOPICLENOL 0.5 MMOL/ML IV SOLN
7.5000 mL | Freq: Once | INTRAVENOUS | Status: AC | PRN
  Administered 2024-01-21: 7.5 mL via INTRAVENOUS

## 2024-01-21 NOTE — Progress Notes (Signed)
 Mr. Miguel Wolfe presents presents today for follow up after completing radiation treatment to his spine. He completed radiation on 02/03/2022.  Recent neurologic symptoms, if any:  Seizures: None Headaches: Occasional headaches Nausea: None Dizziness/ataxia: None Difficulty with hand coordination: None Focal numbness/weakness: None Visual deficits/changes: None Confusion/Memory deficits: None  Other issues of note:  None BP (!) 145/84 (BP Location: Left Arm, Patient Position: Sitting, Cuff Size: Large)   Pulse 66   Temp 97.7 F (36.5 C)   Resp 18   Ht 5' 10 (1.778 m)   Wt 169 lb 3.2 oz (76.7 kg)   SpO2 100%   BMI 24.28 kg/m   Wt Readings from Last 3 Encounters:  01/29/24 169 lb 3.2 oz (76.7 kg)  10/10/23 162 lb (73.5 kg)  08/12/23 160 lb (72.6 kg)

## 2024-01-27 ENCOUNTER — Encounter

## 2024-01-29 ENCOUNTER — Ambulatory Visit
Admission: RE | Admit: 2024-01-29 | Discharge: 2024-01-29 | Disposition: A | Discharge status: Home / Self Care | Source: Ambulatory Visit | Attending: Radiation Oncology | Admitting: Radiation Oncology

## 2024-01-29 ENCOUNTER — Encounter: Payer: Self-pay | Admitting: Radiation Oncology

## 2024-01-29 VITALS — BP 145/84 | HR 66 | Temp 97.7°F | Resp 18 | Ht 70.0 in | Wt 169.2 lb

## 2024-01-29 DIAGNOSIS — D1809 Hemangioma of other sites: Secondary | ICD-10-CM

## 2024-01-29 NOTE — Progress Notes (Signed)
 " Radiation Oncology         [phone] ________________________________  Name: Miguel Wolfe MRN: [id number]  Date: 01/29/2024  DOB: [birth year]  Follow-Up Visit Note  Outpatient  CC: Seabron Lenis, MD  Seabron Lenis, MD  Diagnosis:     ICD-10-CM   1. 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       First Treatment Date: 2022-01-15 - Last Treatment Date: 2022-01-24   Plan Name: Spine_L1_SRT Site: Lumbar Spine Technique: SBRT/SRT-IMRT Mode: Photon Dose Per Fraction: 6 Gy Prescribed Dose (Delivered / Prescribed): 30 Gy / 30 Gy Prescribed Fxs (Delivered / Prescribed): 5 / 5  tage assigned - Unsigned   CHIEF COMPLAINT: Here for follow-up and surveillance of hemangioma of the spine  Interval Since Last Radiation:  1 year  Narrative:  The patient returns today for routine follow-up after completing SRS treatment to L1 and to review his most recent L spine MRI which shows no evidence of progressive or recurrent disease.   r. Jacinta presents presents today for follow up after completing radiation treatment to his spine. He completed radiation on 02/03/2022.  Recent neurologic symptoms, if any:  Seizures: None Headaches: Occasional headaches Nausea: None Dizziness/ataxia: None Difficulty with hand coordination: None Focal numbness/weakness: None Visual deficits/changes: None Confusion/Memory deficits: None  Other issues of note: He reports that his lumbar back pain is a bit worse than last year.  He still walks 6000-7000 steps a day.  He has tried physical therapy but it has not helped much for his pain.  He has not seen his pain specialist recently at the neurosurgical clinic.  No other concerns stated today  ALLERGIES:  is allergic to propranolol , gemfibrozil, niaspan [niacin er (antihyperlipidemic)], and simvastatin.  Meds: Current Outpatient Medications  Medication Sig Dispense Refill   acetaminophen  (TYLENOL ) 500 MG tablet Take 500 mg by mouth every 6 (six) hours as needed for  moderate pain (Back pain).     amLODipine  (NORVASC ) 10 MG tablet Take 10 mg by mouth daily.     aspirin  EC 81 MG tablet Take 81 mg by mouth at bedtime.     Bempedoic Acid-Ezetimibe  (NEXLIZET ) 180-10 MG TABS Take 1 tablet by mouth daily. 90 tablet 3   clobetasol (TEMOVATE) 0.05 % external solution Apply 1 Application topically daily. (Patient not taking: Reported on 10/10/2023)     Coenzyme Q10 (COQ10) 100 MG CAPS Take 100 mg by mouth daily.     famotidine  (PEPCID ) 20 MG tablet Take 20 mg by mouth daily.     fluticasone  (FLONASE ) 50 MCG/ACT nasal spray Place 1 spray into both nostrils daily as needed for allergies or rhinitis (Spring and Fall).     ibuprofen  (ADVIL ) 200 MG tablet Take 200 mg by mouth every 6 (six) hours as needed for moderate pain.     ketoconazole (NIZORAL) 2 % shampoo Apply topically as needed for irritation.     levothyroxine  (SYNTHROID ) 125 MCG tablet Take 125 mcg by mouth daily before breakfast.     LORazepam  (ATIVAN ) 0.5 MG tablet TAKE 1 TABLET BY MOUTH AS NEEDED FOR ONLY FOR MRI PROCEDURES 2 tablet 0   losartan  (COZAAR ) 100 MG tablet Take 100 mg by mouth daily.     nitroGLYCERIN  (NITROSTAT ) 0.4 MG SL tablet PLACE ONE TABLET UNDER TONGUE AS NEEDED FOR CHEST PAIN EVERY 5 MINUTES 25 tablet 11   omega-3 acid ethyl esters (LOVAZA ) 1 g capsule Take 2 g by mouth in the morning and at bedtime.     Omega-3 Fatty  Acids (FISH OIL) 1000 MG CAPS Take 2,000 mg by mouth in the morning and at bedtime.     pantoprazole  (PROTONIX ) 40 MG tablet Take 40 mg by mouth every evening.     pravastatin  (PRAVACHOL ) 80 MG tablet Take 1 tablet (80 mg total) by mouth daily. 90 tablet 1   propranolol  (INDERAL ) 20 MG tablet Take 1 tablet (20 mg total) by mouth 2 (two) times daily. 180 tablet 1   zolpidem  (AMBIEN ) 10 MG tablet Take 10 mg by mouth at bedtime.  0   No current facility-administered medications for this encounter.    Physical Findings:  The patient is in no acute distress. Patient is alert  and oriented.  height is 5' 10 (1.778 m) and weight is 169 lb 3.2 oz (76.7 kg). His temperature is 97.7 F (36.5 C). His blood pressure is 145/84 (abnormal) and his pulse is 66. His respiration is 18 and oxygen saturation is 100%. .   In general this is a well appearing male in no acute distress. He's alert and oriented x4 and appropriate throughout the examination.  HEENT: Head is normocephalic Neck without any palpable masses CV: heart regular in rate and rhythm with no murmurs PULM: CTAB Abdomen soft nontender nondistended Extremities with no clubbing cyanosis or edema Lymphatics: No palpable cervical, axillary, or supraclavicular LAD MUSK: Strength is symmetric in all extremities.  He is ambulatory.  Well-nourished.  Patient ambulates well on his own.  No significant tenderness to palpation throughout the lumbar spine Skin: Lumbar surgical scar at midline of back has healed well  Lab Findings: Lab Results  Component Value Date   WBC 6.1 08/05/2023   HGB 14.7 08/05/2023   HCT 43.6 08/05/2023   MCV 88.6 08/05/2023   PLT 166 08/05/2023    Radiographic Findings: MR Lumbar Spine W Wo Contrast Result Date: 01/21/2024 EXAM: MRI LUMBAR SPINE 01/21/2024 04:24:12 PM TECHNIQUE: Multiplanar multisequence MRI of the lumbar spine was performed with and without the administration of intravenous contrast. COMPARISON: Multiple post-treatment lumbar MRIs from July 16, 2023, and earlier. CLINICAL HISTORY: 79 year old male with a history of lung cancer and L1 bone lesion. Aggressive hemangioma with pathologic fracture. And status post percutaneous biopsy and ORIF in 2023. FINDINGS: Normal lumbar segmentation. BONES AND ALIGNMENT: Stable lordosis since last year. Chronically abnormal L1 vertebral body with near vertebral plana, which does not appear significantly changed from last year in July. Superimposed mild postoperative and hardware susceptibility artifact from T12 to L2-L3, stable. No new or  suspicious marrow signal abnormality. Intact visible sacrum. Sequelae of hardware removal from the L3 vertebra. Lumbar marrow signal outside of the treated levels is stable and benign (chronic degenerative endplate marrow signal changes such as at L5-S1). SPINAL CORD: Normal conus medullaris at L1. No signal abnormality in the visible lower thoracic spinal cord or conus. No abnormal intradural enhancement or dural thickening. SOFT TISSUES: Chronic biconvex right L1 paraspinal soft tissue lesion, stable to perhaps slightly smaller since 2023 (series 305, image 11), most likely a chronic component of the hemangioma as described previously. No new paraspinal soft tissue finding. SPINAL LEVELS: L1-L2: Chronic retropulsion of L1 is stable along with mild spinal stenosis there (series 305, image 12). Mild to moderate chronic T12 and L1 neural foraminal stenosis related to the retropulsion appears stable. The right neural foramina are chronically less affected. No progression of ordinary lumbar spine degeneration elsewhere. No other significant lumbar spinal stenosis. IMPRESSION: 1. Stable post-treatment appearance. L1 pathologic compression fracture with vertebra plana  and retropulsion stable since July. 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 2. No new or suspicious findings in the lumbar spine. Chronic right L1 paraspinal soft tissue lesion, which is most likely a component of the hemangioma, appears mildly regressed since 2023. Electronically signed by: Helayne Hurst MD 01/21/2024 06:55 PM EST RP Workstation: [id number]    Impression/Plan:  Hemangioma at the L1 vertebral body; s/p SRS on 01/24/22.   Recent MRI shows stable posttreatment appearance.  No new suspicious findings.  The chronic right L1 paraspinal soft tissue lesion, felt to be a component of the hemangioma, has mildly progressed since 2023. We personally reviewed his serial images with him and his wife.  Patient states he still experiences back pain, and it is a bit worse.  I recommended he follow-up with his pain specialist at the neurosurgical clinic to see if there are any other options to palliate his pain  We will continue with 6 month follow-ups at this time. Follow-up MRI in 6 months. Patient will see Dr. Lanis to review his next scan. We will then see the patient back in one year. He knows he can call back at anytime with questions or concerns before his next visit.     On date of service, in total, I spent 30 minutes on this encounter. Patient was seen in person.   -----------------------------------  Lauraine Golden, MD     "

## 2024-02-04 ENCOUNTER — Ambulatory Visit (HOSPITAL_COMMUNITY)

## 2024-02-04 ENCOUNTER — Inpatient Hospital Stay

## 2024-02-11 ENCOUNTER — Ambulatory Visit: Admitting: Internal Medicine

## 2024-02-12 ENCOUNTER — Encounter (HOSPITAL_COMMUNITY): Payer: Self-pay

## 2024-02-12 ENCOUNTER — Ambulatory Visit (HOSPITAL_COMMUNITY)
Admission: RE | Admit: 2024-02-12 | Discharge: 2024-02-12 | Disposition: A | Source: Ambulatory Visit | Attending: Internal Medicine | Admitting: Internal Medicine

## 2024-02-12 ENCOUNTER — Inpatient Hospital Stay

## 2024-02-12 DIAGNOSIS — C3490 Malignant neoplasm of unspecified part of unspecified bronchus or lung: Secondary | ICD-10-CM

## 2024-02-12 LAB — CMP (CANCER CENTER ONLY)
ALT: 16 U/L (ref 0–44)
AST: 27 U/L (ref 15–41)
Albumin: 4.9 g/dL (ref 3.5–5.0)
Alkaline Phosphatase: 86 U/L (ref 38–126)
Anion gap: 8 (ref 5–15)
BUN: 13 mg/dL (ref 8–23)
CO2: 31 mmol/L (ref 22–32)
Calcium: 9.3 mg/dL (ref 8.9–10.3)
Chloride: 103 mmol/L (ref 98–111)
Creatinine: 0.75 mg/dL (ref 0.61–1.24)
GFR, Estimated: >60 mL/min
Glucose, Bld: 117 mg/dL — ABNORMAL HIGH (ref 70–99)
Potassium: 3.6 mmol/L (ref 3.5–5.1)
Sodium: 142 mmol/L (ref 135–145)
Total Bilirubin: 0.6 mg/dL (ref 0.0–1.2)
Total Protein: 7.1 g/dL (ref 6.5–8.1)

## 2024-02-12 LAB — CBC WITH DIFFERENTIAL (CANCER CENTER ONLY)
Abs Immature Granulocytes: 0.01 10*3/uL (ref 0.00–0.07)
Basophils Absolute: 0 10*3/uL (ref 0.0–0.1)
Basophils Relative: 1 %
Eosinophils Absolute: 0.1 10*3/uL (ref 0.0–0.5)
Eosinophils Relative: 3 %
HCT: 44.7 % (ref 39.0–52.0)
Hemoglobin: 15 g/dL (ref 13.0–17.0)
Immature Granulocytes: 0 %
Lymphocytes Relative: 39 %
Lymphs Abs: 2 10*3/uL (ref 0.7–4.0)
MCH: 29.7 pg (ref 26.0–34.0)
MCHC: 33.6 g/dL (ref 30.0–36.0)
MCV: 88.5 fL (ref 80.0–100.0)
Monocytes Absolute: 0.5 10*3/uL (ref 0.1–1.0)
Monocytes Relative: 10 %
Neutro Abs: 2.5 10*3/uL (ref 1.7–7.7)
Neutrophils Relative %: 47 %
Platelet Count: 162 10*3/uL (ref 150–400)
RBC: 5.05 MIL/uL (ref 4.22–5.81)
RDW: 12.9 % (ref 11.5–15.5)
WBC Count: 5.1 10*3/uL (ref 4.0–10.5)
nRBC: 0 % (ref 0.0–0.2)

## 2024-02-12 MED ORDER — IOHEXOL 300 MG/ML  SOLN
75.0000 mL | Freq: Once | INTRAMUSCULAR | Status: AC | PRN
  Administered 2024-02-12: 75 mL via INTRAVENOUS

## 2024-02-14 NOTE — Progress Notes (Unsigned)
 [facility] OFFICE PROGRESS NOTE  Seabron Lenis, MD [address] [place] [address]  DIAGNOSIS: Stage IA (T1c, N0, M0) non-small cell lung cancer, adenocarcinoma with no actionable mutation and negative PDL 1 expression diagnosed in June 2020.   PRIOR THERAPY: Status post left upper lobectomy with lymph node dissection on June 24, 2018.   CURRENT THERAPY: Observation  INTERVAL HISTORY: Miguel Wolfe 79 y.o. male returns to clinic today for follow-up visit.  The patient was last seen in clinic 6 months ago by Dr. Sherrod on 8//25.  The patient has a history of stage Ia non-small cell lung cancer, adenocarcinoma.  He is currently on observation.  He is status post surgical resection and has been on observation since that time which was in 2020.  His surgery was with the left upper lobe.  However Dr. Sherrod has been monitoring him every 6 months due to a right upper lobe lung nodule  He denies any major changes in his health since he was last seen.  He denies any fever, chills, night sweats, or unexplained weight loss.  He denies any chest pain, shortness of breath, cough, or hemoptysis.  Denies any nausea, vomiting, diarrhea, or constipation.  Denies any headache or visual changes.  Denies any rashes or skin changes.  He is here today for evaluation and repeat blood work.     MEDICAL HISTORY: Past Medical History:  Diagnosis Date   Aortic insufficiency    Arthritis    CAD (coronary artery disease)    One Stent placed   Colon polyps    Dyspnea    With exertion related to 1/2 lung removed   Family history of adverse reaction to anesthesia    Mom vomits and Dad went nuts   Family history of colon cancer    Family history of stomach cancer    Fatigue    GERD (gastroesophageal reflux disease)    Hemorrhoids    History of echocardiogram    Echo 02/08/16: Mild LVH, EF 55-60, no RWMA, Gr 1 DD, trivial AI, mild LAE   Hyperlipidemia    Hypertension     Lung cancer (HCC)    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   PONV (postoperative nausea and vomiting)    Pre-diabetes    Pulmonary nodules    Thyroid  cancer (HCC)    thyroid  removed    ALLERGIES:  is allergic to propranolol , gemfibrozil, niaspan [niacin er (antihyperlipidemic)], and simvastatin.  MEDICATIONS:  Current Outpatient Medications  Medication Sig Dispense Refill   acetaminophen  (TYLENOL ) 500 MG tablet Take 500 mg by mouth every 6 (six) hours as needed for moderate pain (Back pain).     amLODipine  (NORVASC ) 10 MG tablet Take 10 mg by mouth daily.     aspirin  EC 81 MG tablet Take 81 mg by mouth at bedtime.     Bempedoic Acid-Ezetimibe  (NEXLIZET ) 180-10 MG TABS Take 1 tablet by mouth daily. 90 tablet 3   clobetasol (TEMOVATE) 0.05 % external solution Apply 1 Application topically daily. (Patient not taking: Reported on 10/10/2023)     Coenzyme Q10 (COQ10) 100 MG CAPS Take 100 mg by mouth daily.     famotidine  (PEPCID ) 20 MG tablet Take 20 mg by mouth daily.     fluticasone  (FLONASE ) 50 MCG/ACT nasal spray Place 1 spray into both nostrils daily as needed for allergies or rhinitis (Spring and Fall).     ibuprofen  (ADVIL ) 200 MG tablet Take 200 mg by mouth every 6 (six)  hours as needed for moderate pain.     ketoconazole (NIZORAL) 2 % shampoo Apply topically as needed for irritation.     levothyroxine  (SYNTHROID ) 125 MCG tablet Take 125 mcg by mouth daily before breakfast.     LORazepam  (ATIVAN ) 0.5 MG tablet TAKE 1 TABLET BY MOUTH AS NEEDED FOR ONLY FOR MRI PROCEDURES 2 tablet 0   losartan  (COZAAR ) 100 MG tablet Take 100 mg by mouth daily.     nitroGLYCERIN  (NITROSTAT ) 0.4 MG SL tablet PLACE ONE TABLET UNDER TONGUE AS NEEDED FOR CHEST PAIN EVERY 5 MINUTES 25 tablet 11   omega-3 acid ethyl esters (LOVAZA ) 1 g capsule Take 2 g by mouth in the morning and at bedtime.     Omega-3 Fatty Acids (FISH OIL) 1000 MG CAPS Take 2,000 mg by mouth in the morning and at bedtime.     pantoprazole  (PROTONIX ) 40 MG tablet  Take 40 mg by mouth every evening.     pravastatin  (PRAVACHOL ) 80 MG tablet Take 1 tablet (80 mg total) by mouth daily. 90 tablet 1   propranolol  (INDERAL ) 20 MG tablet Take 1 tablet (20 mg total) by mouth 2 (two) times daily. 180 tablet 1   zolpidem  (AMBIEN ) 10 MG tablet Take 10 mg by mouth at bedtime.  0   No current facility-administered medications for this visit.    SURGICAL HISTORY:  Past Surgical History:  Procedure Laterality Date   BACK SURGERY     COLONOSCOPY WITH ESOPHAGOGASTRODUODENOSCOPY (EGD)  2008   CORONARY STENT INTERVENTION N/A 02/20/2016   Procedure: Coronary Stent Intervention;  Surgeon: Lonni JONETTA Cash, MD;  Location: [facility];  Service: Cardiovascular;  Laterality: N/A;   ESOPHAGOGASTRODUODENOSCOPY     HARDWARE REMOVAL Bilateral 05/10/2022   Procedure: REMOVAL OF THORACIC ELEVEN AND LUMBAR THREE PEDICLE SCREWS AND ROD SEGMENTS;  Surgeon: Lanis Pupa, MD;  Location: [facility] OR;  Service: Neurosurgery;  Laterality: Bilateral;  3C   INGUINAL HERNIA REPAIR Right 02/07/2022   Procedure: OPEN RIGHT INGUINAL HERNIA REPAIR WITH MESH;  Surgeon: Kinsinger, Herlene Righter, MD;  Location: [facility] ORS;  Service: General;  Laterality: Right;   LEFT HEART CATH AND CORONARY ANGIOGRAPHY N/A 02/20/2016   Procedure: Left Heart Cath and Coronary Angiography;  Surgeon: Lonni JONETTA Cash, MD;  Location: [facility] INVASIVE CV LAB;  Service: Cardiovascular;  Laterality: N/A;   LUNG REMOVAL, PARTIAL Left 06/24/2018   Pt states 1/2 of lung was removed   PILONIDAL CYST EXCISION     REFRACTIVE SURGERY Right    retina tear repair   THYROIDECTOMY N/A 01/17/2018   Procedure: TOTAL THYROIDECTOMY;  Surgeon: Eletha Boas, MD;  Location: [facility] ORS;  Service: General;  Laterality: N/A;   VIDEO ASSISTED THORACOSCOPY (VATS)/WEDGE RESECTION Left 06/24/2018   Procedure: VIDEO ASSISTED THORACOSCOPY (VATS)/LUNG RESECTION;  Surgeon: Army Dallas NOVAK, MD;  Location: [facility] OR;  Service: Thoracic;   Laterality: Left;   VIDEO BRONCHOSCOPY WITH ENDOBRONCHIAL NAVIGATION N/A 11/18/2017   Procedure: VIDEO BRONCHOSCOPY WITH ENDOBRONCHIAL NAVIGATION WITH BIOPSIES OF LEFT AND RIGHT UPPER LOBES;  Surgeon: Army Dallas NOVAK, MD;  Location: [facility] OR;  Service: Thoracic;  Laterality: N/A;   VIDEO BRONCHOSCOPY WITH ENDOBRONCHIAL NAVIGATION N/A 06/11/2018   Procedure: VIDEO BRONCHOSCOPY WITH ENDOBRONCHIAL NAVIGATION;  Surgeon: Army Dallas NOVAK, MD;  Location: [facility] OR;  Service: Thoracic;  Laterality: N/A;    REVIEW OF SYSTEMS:   Review of Systems  Constitutional: Negative for appetite change, chills, fatigue, fever and unexpected weight change.  HENT:   Negative for mouth sores,  nosebleeds, sore throat and trouble swallowing.   Eyes: Negative for eye problems and icterus.  Respiratory: Negative for cough, hemoptysis, shortness of breath and wheezing.   Cardiovascular: Negative for chest pain and leg swelling.  Gastrointestinal: Negative for abdominal pain, constipation, diarrhea, nausea and vomiting.  Genitourinary: Negative for bladder incontinence, difficulty urinating, dysuria, frequency and hematuria.   Musculoskeletal: Negative for back pain, gait problem, neck pain and neck stiffness.  Skin: Negative for itching and rash.  Neurological: Negative for dizziness, extremity weakness, gait problem, headaches, light-headedness and seizures.  Hematological: Negative for adenopathy. Does not bruise/bleed easily.  Psychiatric/Behavioral: Negative for confusion, depression and sleep disturbance. The patient is not nervous/anxious.     PHYSICAL EXAMINATION:  There were no vitals taken for this visit.  ECOG PERFORMANCE STATUS: {CHL ONC ECOG [id number]  Physical Exam  Constitutional: Oriented to person, place, and time and well-developed, well-nourished, and in no distress. No distress.  HENT:  Head: Normocephalic and atraumatic.  Mouth/Throat: Oropharynx is clear and moist. No oropharyngeal  exudate.  Eyes: Conjunctivae are normal. Right eye exhibits no discharge. Left eye exhibits no discharge. No scleral icterus.  Neck: Normal range of motion. Neck supple.  Cardiovascular: Normal rate, regular rhythm, normal heart sounds and intact distal pulses.   Pulmonary/Chest: Effort normal and breath sounds normal. No respiratory distress. No wheezes. No rales.  Abdominal: Soft. Bowel sounds are normal. Exhibits no distension and no mass. There is no tenderness.  Musculoskeletal: Normal range of motion. Exhibits no edema.  Lymphadenopathy:    No cervical adenopathy.  Neurological: Alert and oriented to person, place, and time. Exhibits normal muscle tone. Gait normal. Coordination normal.  Skin: Skin is warm and dry. No rash noted. Not diaphoretic. No erythema. No pallor.  Psychiatric: Mood, memory and judgment normal.  Vitals reviewed.  LABORATORY DATA: Lab Results  Component Value Date   WBC 5.1 02/12/2024   HGB 15.0 02/12/2024   HCT 44.7 02/12/2024   MCV 88.5 02/12/2024   PLT 162 02/12/2024      Chemistry      Component Value Date/Time   NA 142 02/12/2024 0729   NA 143 04/24/2016 0935   K 3.6 02/12/2024 0729   CL 103 02/12/2024 0729   CO2 31 02/12/2024 0729   BUN 13 02/12/2024 0729   BUN 17 04/24/2016 0935   CREATININE 0.75 02/12/2024 0729      Component Value Date/Time   CALCIUM  9.3 02/12/2024 0729   ALKPHOS 86 02/12/2024 0729   AST 27 02/12/2024 0729   ALT 16 02/12/2024 0729   BILITOT 0.6 02/12/2024 0729       RADIOGRAPHIC STUDIES:  CT Chest W Contrast Result Date: 02/12/2024 CLINICAL DATA:  Non-small cell lung cancer (NSCLC), staging. * Tracking Code: BO * EXAM: CT CHEST WITH CONTRAST TECHNIQUE: Multidetector CT imaging of the chest was performed during intravenous contrast administration. RADIATION DOSE REDUCTION: This exam was performed according to the departmental dose-optimization program which includes automated exposure control, adjustment of the mA  and/or kV according to patient size and/or use of iterative reconstruction technique. CONTRAST:  75mL OMNIPAQUE  IOHEXOL  300 MG/ML  SOLN COMPARISON:  CT scan chest from 08/05/2023. FINDINGS: Cardiovascular: Normal cardiac size. No pericardial effusion. No aortic aneurysm. There are coronary artery calcifications, in keeping with coronary artery disease. There are also mild peripheral atherosclerotic vascular calcifications of thoracic aorta and its major branches. Mediastinum/Nodes: Surgically absent thyroid  gland. No solid / cystic mediastinal masses. The esophagus is nondistended precluding optimal assessment. No axillary,  mediastinal or hilar lymphadenopathy by size criteria. Lungs/Pleura: The central tracheo-bronchial tree is patent. Redemonstration of postsurgical changes from prior left lung upper lobectomy. There is stable linear area of fibrosis/scarring in right upper lobe with adjacent nodule (series 8, image 38), and faint ground-glass changes, unchanged since several prior studies. No new mass or consolidation. No pleural effusion or pneumothorax. No suspicious lung nodules. Upper Abdomen: Visualized upper abdominal viscera within normal limits. There is tiny sliding hiatal hernia. Musculoskeletal: The visualized soft tissues of the chest wall are grossly unremarkable. No suspicious osseous lesions. There are mild multilevel degenerative changes in the visualized spine. Redemonstration of marked compression deformity of L1 vertebra, slightly progressed since the prior study. There is mild to moderate retropulsion of the posterior vertebral body causing mild-to-moderate spinal canal stenosis, better evaluated on the MRI lumbar spine. Posterior spinal fixation hardware again noted from T12 through L2 and extending inferiorly. IMPRESSION: 1. Redemonstration of postsurgical changes from prior left lung upper lobectomy. Stable linear area of fibrosis/scarring with adjacent nodule and ground-glass changes in  the right upper lobe. No new mass, consolidation, pleural effusion or pneumothorax. No new or suspicious lung nodule. 2. Multiple other nonacute observations (such as surgically absent thyroid  gland, small sliding hiatal hernia, progressive marked compression deformity of L1 with mild-to-moderate retropulsion of the posterior vertebral body causing mild-to-moderate spinal canal stenosis), as described above. Aortic Atherosclerosis (ICD10-I70.0). Electronically Signed   By: Ree Molt M.D.   On: 02/12/2024 11:47   MR Lumbar Spine W Wo Contrast Result Date: 01/21/2024 EXAM: MRI LUMBAR SPINE 01/21/2024 04:24:12 PM TECHNIQUE: Multiplanar multisequence MRI of the lumbar spine was performed with and without the administration of intravenous contrast. COMPARISON: Multiple post-treatment lumbar MRIs from July 16, 2023, and earlier. CLINICAL HISTORY: 79 year old male with a history of lung cancer and L1 bone lesion. Aggressive hemangioma with pathologic fracture. And status post percutaneous biopsy and ORIF in 2023. FINDINGS: Normal lumbar segmentation. BONES AND ALIGNMENT: Stable lordosis since last year. Chronically abnormal L1 vertebral body with near vertebral plana, which does not appear significantly changed from last year in July. Superimposed mild postoperative and hardware susceptibility artifact from T12 to L2-L3, stable. No new or suspicious marrow signal abnormality. Intact visible sacrum. Sequelae of hardware removal from the L3 vertebra. Lumbar marrow signal outside of the treated levels is stable and benign (chronic degenerative endplate marrow signal changes such as at L5-S1). SPINAL CORD: Normal conus medullaris at L1. No signal abnormality in the visible lower thoracic spinal cord or conus. No abnormal intradural enhancement or dural thickening. SOFT TISSUES: Chronic biconvex right L1 paraspinal soft tissue lesion, stable to perhaps slightly smaller since 2023 (series 305, image 11), most likely a  chronic component of the hemangioma as described previously. No new paraspinal soft tissue finding. SPINAL LEVELS: L1-L2: Chronic retropulsion of L1 is stable along with mild spinal stenosis there (series 305, image 12). Mild to moderate chronic T12 and L1 neural foraminal stenosis related to the retropulsion appears stable. The right neural foramina are chronically less affected. No progression of ordinary lumbar spine degeneration elsewhere. No other significant lumbar spinal stenosis. IMPRESSION: 1. Stable post-treatment appearance. L1 pathologic compression fracture with vertebra plana and retropulsion stable since July. 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 2. No new or suspicious findings in the lumbar spine. Chronic right L1 paraspinal soft tissue lesion, which is most likely a component of the hemangioma, appears mildly regressed since 2023. Electronically signed by: Helayne  Hall MD 01/21/2024 06:55 PM EST RP Workstation: [id number]     ASSESSMENT/PLAN:  This is a very pleasant 79 year old Caucasian male with a history of stage Ia non-small cell lung cancer, adenocarcinoma.  He is status post left upper lobectomy with lymph node dissection under the care of Dr. Army in June 2020.  He has been on observation and feeling well.  A previous CT scan showed a subsolid right apical opacity that is suspicious for slow-growing adenocarcinoma.  He had a PET scan in July 2025 that showed an elongated nodule with the periphery ground glass density in the right upper lobe with low metabolic activity.  Dr. Sherrod is monitoring this closely.  The patient was seen with Dr. Sherrod today.  Dr. Sherrod personally and independently reviewed the scan and discussed results with the patient today.  The scan showed ***.  Dr. Sherrod recommends ***  We will see the patient back for follow-up visit in 6 months for evaluation and repeat CT scan for close  monitoring.  The patient was advised to call immediately if she has any concerning symptoms in the interval. The patient voices understanding of current disease status and treatment options and is in agreement with the current care plan. All questions were answered. The patient knows to call the clinic with any problems, questions or concerns. We can certainly see the patient much sooner if necessary   No orders of the defined types were placed in this encounter.    I spent {CHL ONC TIME VISIT - DTPQU:[id number]} counseling the patient face to face. The total time spent in the appointment was {CHL ONC TIME VISIT - DTPQU:[id number]}.  Jeannifer Drakeford L Bettina Warn, PA-C 02/14/24

## 2024-02-19 ENCOUNTER — Inpatient Hospital Stay: Admitting: Physician Assistant

## 2024-04-09 ENCOUNTER — Ambulatory Visit: Admitting: Physician Assistant
